# Patient Record
Sex: Female | Born: 1960 | Race: White | Hispanic: No | Marital: Married | Smoking: Never smoker
Health system: Southern US, Community
[De-identification: ages and names within clinical notes are randomized; demographics above are authoritative.]

## PROBLEM LIST (undated history)

## (undated) DIAGNOSIS — D689 Coagulation defect, unspecified: Secondary | ICD-10-CM

## (undated) DIAGNOSIS — I471 Supraventricular tachycardia, unspecified: Secondary | ICD-10-CM

## (undated) DIAGNOSIS — E039 Hypothyroidism, unspecified: Secondary | ICD-10-CM

## (undated) DIAGNOSIS — E785 Hyperlipidemia, unspecified: Secondary | ICD-10-CM

## (undated) DIAGNOSIS — D1803 Hemangioma of intra-abdominal structures: Secondary | ICD-10-CM

## (undated) DIAGNOSIS — E28319 Asymptomatic premature menopause: Secondary | ICD-10-CM

## (undated) DIAGNOSIS — M8580 Other specified disorders of bone density and structure, unspecified site: Secondary | ICD-10-CM

## (undated) DIAGNOSIS — M810 Age-related osteoporosis without current pathological fracture: Secondary | ICD-10-CM

## (undated) HISTORY — DX: Hyperlipidemia, unspecified: E78.5

## (undated) HISTORY — DX: Coagulation defect, unspecified: D68.9

## (undated) HISTORY — DX: Supraventricular tachycardia, unspecified: I47.10

## (undated) HISTORY — DX: Hypothyroidism, unspecified: E03.9

## (undated) HISTORY — DX: Age-related osteoporosis without current pathological fracture: M81.0

## (undated) HISTORY — DX: Other specified disorders of bone density and structure, unspecified site: M85.80

## (undated) HISTORY — PX: TONSILLECTOMY: SUR1361

## (undated) HISTORY — DX: Supraventricular tachycardia: I47.1

## (undated) HISTORY — PX: GALLBLADDER SURGERY: SHX652

## (undated) HISTORY — DX: Asymptomatic premature menopause: E28.319

## (undated) HISTORY — DX: Hemangioma of intra-abdominal structures: D18.03

---

## 1997-09-16 HISTORY — PX: ABDOMINAL HYSTERECTOMY: SHX81

## 2005-10-31 ENCOUNTER — Emergency Department: Payer: Self-pay | Admitting: Emergency Medicine

## 2005-12-19 ENCOUNTER — Ambulatory Visit: Payer: Self-pay | Admitting: Gastroenterology

## 2005-12-24 ENCOUNTER — Ambulatory Visit: Payer: Self-pay | Admitting: Gastroenterology

## 2007-03-12 ENCOUNTER — Other Ambulatory Visit: Payer: Self-pay

## 2007-03-12 ENCOUNTER — Emergency Department: Payer: Self-pay | Admitting: Unknown Physician Specialty

## 2007-03-12 ENCOUNTER — Encounter: Payer: Self-pay | Admitting: Internal Medicine

## 2009-01-26 ENCOUNTER — Ambulatory Visit: Payer: Self-pay | Admitting: Gastroenterology

## 2009-09-16 HISTORY — PX: CARDIAC ELECTROPHYSIOLOGY MAPPING AND ABLATION: SHX1292

## 2009-11-11 ENCOUNTER — Observation Stay: Payer: Self-pay | Admitting: Internal Medicine

## 2009-11-28 ENCOUNTER — Encounter: Payer: Self-pay | Admitting: Internal Medicine

## 2010-01-06 ENCOUNTER — Encounter: Payer: Self-pay | Admitting: Internal Medicine

## 2010-01-07 ENCOUNTER — Encounter: Payer: Self-pay | Admitting: Internal Medicine

## 2010-01-09 ENCOUNTER — Ambulatory Visit: Payer: Self-pay | Admitting: Internal Medicine

## 2010-01-09 DIAGNOSIS — R072 Precordial pain: Secondary | ICD-10-CM | POA: Insufficient documentation

## 2010-01-09 DIAGNOSIS — E785 Hyperlipidemia, unspecified: Secondary | ICD-10-CM | POA: Insufficient documentation

## 2010-01-09 DIAGNOSIS — E032 Hypothyroidism due to medicaments and other exogenous substances: Secondary | ICD-10-CM | POA: Insufficient documentation

## 2010-01-09 DIAGNOSIS — I471 Supraventricular tachycardia: Secondary | ICD-10-CM | POA: Insufficient documentation

## 2010-01-10 ENCOUNTER — Encounter: Payer: Self-pay | Admitting: Internal Medicine

## 2010-01-11 ENCOUNTER — Encounter: Payer: Self-pay | Admitting: Cardiovascular Disease

## 2010-01-16 ENCOUNTER — Telehealth (INDEPENDENT_AMBULATORY_CARE_PROVIDER_SITE_OTHER): Payer: Self-pay | Admitting: *Deleted

## 2010-01-17 ENCOUNTER — Encounter (HOSPITAL_COMMUNITY): Admission: RE | Admit: 2010-01-17 | Discharge: 2010-03-16 | Payer: Self-pay | Admitting: Internal Medicine

## 2010-01-17 ENCOUNTER — Ambulatory Visit: Payer: Self-pay

## 2010-01-17 ENCOUNTER — Ambulatory Visit: Payer: Self-pay | Admitting: Cardiovascular Disease

## 2010-01-19 ENCOUNTER — Telehealth: Payer: Self-pay | Admitting: Cardiovascular Disease

## 2010-01-24 ENCOUNTER — Telehealth: Payer: Self-pay | Admitting: Internal Medicine

## 2010-02-01 ENCOUNTER — Ambulatory Visit: Payer: Self-pay | Admitting: Internal Medicine

## 2010-02-05 ENCOUNTER — Telehealth: Payer: Self-pay | Admitting: Internal Medicine

## 2010-02-08 ENCOUNTER — Encounter: Payer: Self-pay | Admitting: Internal Medicine

## 2010-02-08 ENCOUNTER — Telehealth: Payer: Self-pay | Admitting: Internal Medicine

## 2010-02-21 ENCOUNTER — Telehealth: Payer: Self-pay | Admitting: Internal Medicine

## 2010-02-22 ENCOUNTER — Encounter: Payer: Self-pay | Admitting: Internal Medicine

## 2010-03-01 ENCOUNTER — Ambulatory Visit (HOSPITAL_COMMUNITY): Admission: RE | Admit: 2010-03-01 | Discharge: 2010-03-02 | Payer: Self-pay | Admitting: Internal Medicine

## 2010-03-01 ENCOUNTER — Ambulatory Visit: Payer: Self-pay | Admitting: Internal Medicine

## 2010-03-02 ENCOUNTER — Encounter: Payer: Self-pay | Admitting: Internal Medicine

## 2010-03-05 ENCOUNTER — Encounter: Payer: Self-pay | Admitting: Internal Medicine

## 2010-03-05 ENCOUNTER — Telehealth: Payer: Self-pay | Admitting: Internal Medicine

## 2010-04-02 ENCOUNTER — Ambulatory Visit: Payer: Self-pay | Admitting: Internal Medicine

## 2010-06-29 ENCOUNTER — Telehealth: Payer: Self-pay | Admitting: Internal Medicine

## 2010-07-12 ENCOUNTER — Telehealth: Payer: Self-pay | Admitting: Internal Medicine

## 2010-07-17 ENCOUNTER — Telehealth: Payer: Self-pay | Admitting: Internal Medicine

## 2010-08-08 ENCOUNTER — Encounter: Payer: Self-pay | Admitting: Internal Medicine

## 2010-08-08 ENCOUNTER — Telehealth: Payer: Self-pay | Admitting: Internal Medicine

## 2010-08-21 ENCOUNTER — Ambulatory Visit: Payer: Self-pay | Admitting: Internal Medicine

## 2010-08-22 ENCOUNTER — Ambulatory Visit: Payer: Self-pay | Admitting: Surgery

## 2010-08-27 ENCOUNTER — Ambulatory Visit: Payer: Self-pay | Admitting: Gastroenterology

## 2010-09-05 ENCOUNTER — Telehealth: Payer: Self-pay | Admitting: Internal Medicine

## 2010-09-06 ENCOUNTER — Ambulatory Visit: Payer: Self-pay | Admitting: Surgery

## 2010-09-13 ENCOUNTER — Telehealth: Payer: Self-pay | Admitting: Internal Medicine

## 2010-09-16 ENCOUNTER — Ambulatory Visit: Payer: Self-pay | Admitting: Surgery

## 2010-09-19 ENCOUNTER — Telehealth (INDEPENDENT_AMBULATORY_CARE_PROVIDER_SITE_OTHER): Payer: Self-pay | Admitting: *Deleted

## 2010-10-09 ENCOUNTER — Ambulatory Visit: Payer: Self-pay | Admitting: Oncology

## 2010-10-16 ENCOUNTER — Ambulatory Visit: Payer: Self-pay | Admitting: Gastroenterology

## 2010-10-16 NOTE — Assessment & Plan Note (Signed)
Summary: 11:00 APPT/AMD   Visit Type:  Follow-up Referring Provider:  BENSIMHON Primary Provider:  Dr. Terance Hart  CC:  Clearance for Bariatric Surgery.Marland Kitchen  History of Present Illness: Michelle Armstrong is seen for clearance for baraitric surgery. She is s/p followup for tachycardia for which he underwent EP testing and catheter ablation. 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 She has had no clinical recurrences.  The patient denies SOB, chest pain, edema or palpitations  She is trying to exercise, but has pain on her left heel  she underwent sleeptesting which was abnormal  Pre procedural eval included and echo by Dr Haskel Khan read as normal, and Myoview at [facility] which was normal        Current Medications (verified): 1)  Vitamin E 100 Unit/gm Crea (Vitamin E) .... Take 1 Tablet By Mouth Once Daily 2)  Calcium 1500 Mg Tabs (Calcium Carbonate) .Marland Kitchen.. 1 By Mouth Once Daily 3)  Aspirin 81 Mg Tbec (Aspirin) .... Take One Tablet By Mouth Daily 4)  L-Lysine 500 Mg Tabs (Lysine) .Marland Kitchen.. 1 By Mouth Once Daily 5)  Vitamin D3 1000 Unit Tabs (Cholecalciferol) .... One Tablet Once Daily  Allergies (verified): 1)  ! * Antibiotics 2)  ! * Latex 3)  ! * Metoprolol  Past History:  Past Medical History: Last updated: 01/09/2010 SVT   --echo 2008 EF 55% normal Obesity Hyperlipidemia Low vitamin d Possible h/o hypothyroidis,  Past Surgical History: Last updated: 01/09/2010 hysterectomy  Family History: Last updated: 01/09/2010 Mother: alive at 17 Father: alive at 18 Negative FH of Diabetes, Hypertension, or Coronary Artery Disease  Social History: Last updated: 01/09/2010 Married. 1 son 24 Tobacco Use - No.  Alcohol Use - no Drug Use - no  Risk Factors: Smoking Status: never (01/09/2010)  Vital Signs:  Patient profile:   50 year old female Height:      67 inches Weight:      230 pounds BMI:     36.15 Pulse rate:   92 / minute BP sitting:   120 / 76  (left arm) Cuff size:    large  Vitals Entered By: Bishop Dublin, CMA (August 21, 2010 11:10 AM)  Physical Exam  General:  The patient was alert and oriented in no acute distress. HEENT Normal.  Neck veins were flat, carotids were brisk.  Lungs were clear.  Heart sounds were regular without murmurs or gallops.  Abdomen was soft with active bowel sounds. There is no clubbing cyanosis or edema. Skin Warm and dry    EKG  Procedure date:  08/21/2010  Findings:      sinus@ 92 .18/.08/.36 o/w normal  Impression & Recommendations:  Problem # 1:  PREOPERATIVE EXAMINATION (ICD-V72.84) she should be at acceptable for surgery  Problem # 2:  SVT/ PSVT/ PAT (ICD-427.0) no recurrent svt The following medications were removed from the medication list:    Nitrostat 0.4 Mg Subl (Nitroglycerin) .Marland Kitchen... Take 1 tablet by mouth every 5 minutes as needed for chest pain Her updated medication list for this problem includes:    Aspirin 81 Mg Tbec (Aspirin) .Marland Kitchen... Take one tablet by mouth daily  Problem # 3:  OBESITY (ICD-278.00) we spent an addtional 12 minutes discussing exercese strategies which she might tolerate better

## 2010-10-16 NOTE — Assessment & Plan Note (Signed)
Summary: EPH/AMD   Visit Type:  EPH Referring Provider:  BENSIMHON Primary Provider:  Dr. Terance Hart  CC:  chest soreness....no other complaints today.  History of Present Illness: Michelle Armstrong's is seen in followup for tachycardia for which he underwent EP testing and catheter ablation. 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 She has had no clinical recurrences.  She continues to have atypical chest pains which antedate the procedure. Myoview scanning prior to the procedure had been normal.    Current Medications (verified): 1)  Vitamin E 100 Unit/gm Crea (Vitamin E) .... Take 1 Tablet By Mouth Once Daily 2)  Nitrostat 0.4 Mg Subl (Nitroglycerin) .... Take 1 Tablet By Mouth Every 5 Minutes As Needed For Chest Pain 3)  Calcium 1500 Mg Tabs (Calcium Carbonate) .Marland Kitchen.. 1 By Mouth Once Daily 4)  Aspirin 81 Mg Tbec (Aspirin) .... Take One Tablet By Mouth Daily 5)  L-Lysine 500 Mg Tabs (Lysine) .Marland Kitchen.. 1 By Mouth Once Daily  Allergies: 1)  ! * Antibiotics 2)  ! * Latex 3)  ! * Metoprolol  Past History:  Past Medical History: Last updated: 01/09/2010 SVT   --echo 2008 EF 55% normal Obesity Hyperlipidemia Low vitamin d Possible h/o hypothyroidis,  Past Surgical History: Last updated: 01/09/2010 hysterectomy  Vital Signs:  Patient profile:   50 year old female Height:      67 inches Pulse rate:   76 / minute Pulse rhythm:   regular BP sitting:   117 / 78  (left arm) Cuff size:   large  Vitals Entered By: Danielle Rankin, CMA (April 02, 2010 4:30 PM)  Physical Exam  General:  The patient was alert and oriented in no acute distress. HEENT Normal.  Neck veins were flat, carotids were brisk.  Lungs were clear.  Heart sounds were regular without murmurs or gallops.  Abdomen was soft with active bowel sounds. There is no clubbing cyanosis or edema. Skin Warm and dry    EKG  Procedure date:  04/02/2010  Findings:      sinus rhythm at 76 Intervals 0.20/0.09/0.41 Axis is  57 Otherwise normal  Impression & Recommendations:  Problem # 1:  CHEST PAIN-PRECORDIAL (ICD-786.51) She continues to have atypical chest pains. These are not likely cardiac. No further workup is indicated following her negative Myoview.  we will plan to his helper establish with a primary care physician. I mentioned Dr. Darrick Huntsman  to her despite her reluctance to establish a primary care physician she was agreeable to following through with that. We have called [facility] to establish a referral  Her updated medication list for this problem includes:    Nitrostat 0.4 Mg Subl (Nitroglycerin) .Marland Kitchen... Take 1 tablet by mouth every 5 minutes as needed for chest pain    Aspirin 81 Mg Tbec (Aspirin) .Marland Kitchen... Take one tablet by mouth daily  Problem # 2:  SVT/ PSVT/ PAT (ICD-427.0)  status post catheter ablation for AV node reentry. No clinical recurrences.

## 2010-10-16 NOTE — Letter (Signed)
Summary: [facility]   Imported By: Roderic Ovens 03/23/2010 15:28:41  _____________________________________________________________________  External Attachment:    Type:   Image     Comment:   External Document

## 2010-10-16 NOTE — Progress Notes (Signed)
Summary: RELEASE  RELEASE   Imported By: Frazier Butt Chriscoe 01/11/2010 12:06:22  _____________________________________________________________________  External Attachment:    Type:   Image     Comment:   External Document

## 2010-10-16 NOTE — Letter (Signed)
Summary: [facility]   Imported By: Harlon Flor 01/10/2010 11:10:39  _____________________________________________________________________  External Attachment:    Type:   Image     Comment:   External Document

## 2010-10-16 NOTE — Miscellaneous (Signed)
Summary: Orders Update  Clinical Lists Changes  Orders: Added new Test order of T-CBC No Diff ([id number]) - Signed Added new Test order of T-Basic Metabolic Panel [phone]) - Signed Added new Test order of T-PTT ([id number]) - Signed Added new Test order of T-Protime, Auto ([id number]) - Signed

## 2010-10-16 NOTE — Letter (Signed)
Summary: ELectrophysiology/Ablation Procedure Instructions  Architectural technologist at [facility] [address]   [place], [place] [address]   Phone: [phone]  Fax: [phone]     Electrophysiology/Ablation Procedure Instructions    You are scheduled for a(n) SVT ablation on 03/01/10 at 7:30am with Dr. Graciela Husbands.  1.  Please come to the Short Stay Center at [facility] at 5:30am on the day of your procedure.  2.  Come prepared to stay overnight.   Please bring your insurance cards and a list of your medications.  3.  Have labwork done 7 days prior to procedure on  02/22/10 and results faxed to [facility] at [phone].   4.  Do not have anything to eat or drink after midnight the night before your procedure.   * Occasionally, EP studies and ablations can become lengthy.  Please make your family aware of this before your procedure starts.  Average time ranges from 2-8 hours for EP studies/ablations.  Your physician will locate your family after the procedure with the results.  * If you have any questions after you get home, please call the office at [phone].

## 2010-10-16 NOTE — Progress Notes (Signed)
Summary: Clearance  ---- Converted from flag ---- ---- 07/17/2010 10:45 AM, Nathen May, MD, [facility] wrote: she is s/p abaltion;  she should feel to treat as she would like steve klein  ---- 07/12/2010 2:55 PM, Benedict Needy, RN wrote: Dr. Darrick Huntsman would like to start pt on Phenteramine for weight loss but would like permisson. Please advise. ------------------------------

## 2010-10-16 NOTE — Progress Notes (Signed)
Summary: RESULTS  Phone Note Call from Patient Call back at Home Phone [phone]   Caller: SELF Call For: [facility] Summary of Call: PT WOULD LIKE STRESS TEST RESULTS FROM THIS PAST [facility] Initial call taken by: Harlon Flor,  Jan 19, 2010 10:08 AM  Follow-up for Phone Call        Called spoke with pt, advised stress test results normal per Dr Fabio Bering interpretation. Follow-up by: [name] RN,  Jan 19, 2010 12:38 PM

## 2010-10-16 NOTE — Progress Notes (Signed)
Summary: ABLATION  Phone Note Call from Patient Call back at Home Phone [phone]   Caller: SELF Call For: Rosealee Recinos Summary of Call: WOULD LIKE TO KNOW HOW FAR ALONG ERIKA IS IN SCHEDULING HER ABLATION-WOULD LIKE TO KNOW IF SHE CAN HAVE IT SCHEDULED ON JUNE 1 Initial call taken by: Harlon Flor,  Feb 05, 2010 8:46 AM  Follow-up for Phone Call        WOULD LIKE TO MENTION THAT IF THERE IS AN OPENING FOR THE ABLATION THIS WEEK THAT SHE WOULD LIKE TO TAKE IT Follow-up by: Harlon Flor,  Feb 05, 2010 8:54 AM  Additional Follow-up for Phone Call Additional follow up Details #1::        Attempted to call back pt.  LMOM TCB. [name] RN  Feb 05, 2010 11:48 AM   Scheduled ablation for soonest appt for Dr Graciela Husbands on 03/01/10.  Called spoke with pt, aware of appt date and time.  Additional Follow-up by: [name] RN,  Feb 08, 2010 2:57 PM

## 2010-10-16 NOTE — Progress Notes (Signed)
Summary: IV SITE  Phone Note Call from Patient Call back at Home Phone [phone]   Caller: SELF Call For: Graciela Husbands Summary of Call: PT WAS TOLD TO CALL THE OFFICE AND INFORM us THAT SHE HAD AN ALLERGIC REACTION TO KEFLEX-HAD A RASH AND ITCHING-PT WOULD LIKE A CALL FROM THE NURSE ABOUT THE IV SITE AND GETTING AN ANTIBIOTIC Initial call taken by: Harlon Flor,  March 05, 2010 8:17 AM  Follow-up for Phone Call        Spoke to pt she is having no redness, no swelling.  The area is a small knot under her skin. After speaking with Dr. Mariah Milling instructed the pt to put warm compresses on the area if any redness, more swelling or warmth to see pcp.  Follow-up by: Benedict Needy, RN,  March 05, 2010 9:56 AM

## 2010-10-16 NOTE — Progress Notes (Signed)
Summary: DR Darrick Huntsman  Phone Note Call from Patient Call back at Home Phone [phone]   Caller: SELF Call For: Michelle Armstrong Summary of Call: DR Darrick Huntsman NEEDS PERMISSION TO START PT ON A WEIGHT LOSS PILL  Initial call taken by: Harlon Flor,  July 12, 2010 9:43 AM  Follow-up for Phone Call        [facility] TCB Benedict Needy, RN  July 12, 2010 2:34 PM   Spoke with pt  she said the name of medication is Phenteramine. Will send Dr. Graciela Armstrong flag for permission. Benedict Needy, RN  July 12, 2010 2:53 PM   [facility] that weight loss medication ok'd by Dr. Graciela Armstrong. Note faxed to Dr. Melina Schools office.  Follow-up by: Benedict Needy, RN,  July 17, 2010 10:54 AM

## 2010-10-16 NOTE — Assessment & Plan Note (Signed)
Summary: NP6/AMD   Visit Type:  Initial Consult Primary Provider:  Dr. Terance Hart  CC:  Patient has been feeling horrible and having nausea while she hs been taking Metoprolol. Patient having discomfort in her chest. Patient edema in both feet and ankles and having slight edema in hands. Patient have SOB when walking up hills and inclines..  History of Present Illness: 50 y/o woman with h/o obesity, hyperlipidemia, chest pain and SVT. Previously followed by Dr. Juliann Pares for chest and SVT. Present here to establish care after recent episode of recurrent SVT.  Had her first epiosde of SVT in 2003 after drinking large cup of tea. Converted with adenosine. Saw Dr. Gwen Pounds. Echo at that time (7/08) with EF 55% no valvular abnormalities.   Had another episode of mild palpitations in December which resolved spontaneously.  In February of this year had another episode of palpitations accompanied by CP and palpitations. Seen in ER and discharged. Had echo last month which she doesn't know the results of. Was supposed to have stress test but she didn't do it.  This past weekend was in [place] going to [place] and developed severe CP, dizziness and palpitations. ECG showed SVT at 197 beats per minute. Broke with adenosine at whcih point dizziness and palpitations resolved but CP persisted. Troponin minimally elevated at 0.120. TSH nromal. Kept overnight for observation. Started on lopressor which has made her feel queasy.  Has very stressful job at [employer]. At baseline very functional but has been scared to exercise since episode in February. Notes when she walks to car she gets SOB. No orthopnea or PND. Occasional edema. No CP with exertion but has mild dull ache since SVT.   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 Take 1 Tablet By Mouth Once Daily 2)  Vitamin E 100 Unit/gm Crea (Vitamin E) .... Take 1 Tablet By Mouth Once Daily 3)  Nitrostat 0.4 Mg Subl (Nitroglycerin) .... Take 1 Tablet By Mouth Every 5 Minutes As Needed For Chest Pain  Allergies (verified): 1)  ! * Antibiotics 2)  ! * Latex  Past History:  Past Surgical History: Last updated: 01/09/2010 hysterectomy  Family History: Last updated: 01/09/2010 Mother: alive at 4 Father: alive at 26 Negative FH of Diabetes, Hypertension, or Coronary Artery Disease  Social History: Last updated: 01/09/2010 Married. 1 son 55 Tobacco Use - No.  Alcohol Use - no Drug Use - no  Past Medical History: SVT   --echo 2008 EF 55% normal Obesity Hyperlipidemia Low vitamin d Possible h/o hypothyroidis,  Family History: Reviewed history from 01/09/2010 and no changes required. Mother: alive at 23 Father: alive at 47 Negative FH of Diabetes, Hypertension, or Coronary Artery Disease  Social History: Reviewed history from 01/09/2010 and no changes required. Married. 1 son 41 Tobacco Use - No.  Alcohol Use - no Drug Use - no  Review of Systems       As per HPI and past medical history; otherwise all systems negative.   Vital Signs:  Patient profile:   50 year old female Height:      67 inches  Pulse rate:   79 / minute BP sitting:   120 / 72  (left arm) Cuff size:   large  Physical Exam  General:  Gen: well appearing. no resp difficulty HEENT: normal Neck: supple. no JVD. Carotids 2+ bilat; no bruits. No lymphadenopathy or thryomegaly appreciated. Cor: PMI nondisplaced. Regular rate & rhythm. No rubs, gallops, murmur. Lungs: clear Abdomen:  obese. soft, nontender, nondistended.  Good bowel sounds. Extremities: no cyanosis, clubbing, rash, edema Neuro: alert & orientedx3, cranial nerves grossly intact. moves all 4 extremities w/o difficulty. affect pleasant    Impression & Recommendations:  Problem # 1:  SVT/ PSVT/ PAT (ICD-427.0) Had long talk with her and her husband about the mechanisms of SVT and the possible therapies. Given increasing severity and frequency of the episodes I have strongly recommended SVT ablation with Dr. Graciela Husbands. We discussed what an EP study is as well as the risks. They are eager to proceed. We will arrange an appt. Given CP we will order Lexiscan Myoview to evaluate. She prefers not to walk on treadmill due to concern over triggering recurrent SVT. have instructed her to use lopressor only as needed for palpitations. Time spent 1 hour.  Problem # 2:  CHEST PAIN-PRECORDIAL (ICD-786.51) As above will order stress test.   Other Orders: Nuclear Stress Test (Nuc Stress Test)  Patient Instructions: 1)  Your physician recommends that you schedule a follow-up appointment with Dr. Graciela Husbands as soon as possible.  Follow up with Dr. Gala Romney as needed. 2)  Your physician has requested that you have an lexiscan myoview.  For further information please visit [email or web address]  Please follow instruction sheet, as given.

## 2010-10-16 NOTE — Progress Notes (Signed)
Summary: ABLATION  Phone Note Call from Patient Call back at Home Phone [phone]   Caller: SELF Call For: Sylis Ketchum Summary of Call: PT IS UNDER THE IMPRESSION THAT SHE IS DEFINATELY GETTING AN ABLATION AND WANTS IT SCHEDULED FOR 5/26-I TOLD HER THAT I THOUGHT SHE WAS SCHEDULED TO SEE DR Graciela Husbands TO DISCUSS THE POSSIBILITY OF AN ABLATION, BUT THAT I WAS NOT SURE AND WOULD LET THE NURSE CALL HER BACK Initial call taken by: Harlon Flor,  Jan 24, 2010 11:19 AM  Follow-up for Phone Call        Called spoke with pt, advised she must first see Dr Graciela Husbands before the ablation can be scheduled.  Also made pt aware Dr Graciela Husbands is not in the hospital working that day, so that day would not be an option to schedule. Follow-up by: [name] RN,  Jan 24, 2010 12:12 PM

## 2010-10-16 NOTE — Progress Notes (Signed)
Summary: Nuclear Pre-Procedure  Phone Note Outgoing Call   Call placed by: Milana Na, EMT-P,  Jan 16, 2010 2:33 PM Summary of Call: Left message with information on Myoview Information Sheet (see scanned document for details).      Nuclear Med Background Indications for Stress Test: Evaluation for Ischemia  Indications Comments: [facility] ([place] for SVT converted with Adensosine  History: Echo  History Comments: 07/08 ECHO EF 55%  Symptoms: Chest Pain, Dizziness, DOE, Nausea, Palpitations    Nuclear Pre-Procedure Cardiac Risk Factors: Lipids Height (in): 67  Nuclear Med Study Referring MD:  D.Bensimhon

## 2010-10-16 NOTE — Letter (Signed)
Summary: Medical Record Release  Medical Record Release   Imported By: Harlon Flor 08/08/2010 14:27:29  _____________________________________________________________________  External Attachment:    Type:   Image     Comment:   External Document

## 2010-10-16 NOTE — Assessment & Plan Note (Signed)
Summary: nep/svt/   Visit Type:  NEP Referring Provider:  BENSIMHON Primary Provider:  Dr. Terance Hart  CC:  LITTLE SORENESS IN CHEST, NO SHORTNESS OF BREATH, and NO EDEMA IN ANKLES AND FEET.Marland Kitchen  History of Present Illness: 50 y/o is seen atheh request of dr DB for  SVT.   Had her first epiosde of SVT in 2003 after drinking large cup of tea. Converted with adenosine. Saw Dr. Gwen Pounds. Echo at that time (7/08) with EF 55% no valvular abnormalities.   Had another episode of mild palpitations in December which resolved spontaneously.  In February of this year had another episode of palpitations accompanied by CP and palpitations. Seen in ER and discharged.    This past weekend was in [place] going to [place] and developed severe CP, dizziness and palpitations. ECG showed SVT at 197 beats per minute. Broke with adenosine at whcih point dizziness and palpitations resolved but CP persisted. Troponin minimally elevated at 0.120. TSH nromal. Kept overnight for observation. Started on lopressor which has made her feel queasy.  Has very stressful job at [employer]. At baseline very functional but has been scared to exercise since episode in February. Notes when she walks to car she gets SOB. No orthopnea or PND. Occasional edema. No CP with exertion but has mild dull ache since SVT.   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 Take 1 Tablet By Mouth Once Daily 2)  Nitrostat 0.4 Mg Subl (Nitroglycerin) .... Take 1 Tablet By Mouth Every 5 Minutes As Needed For Chest Pain 3)  Calcium 1500 Mg Tabs (Calcium Carbonate) .Marland Kitchen.. 1 By Mouth Once Daily 4)  Aspirin 81 Mg Tbec (Aspirin) .... Take One Tablet By Mouth Daily 5)  L-Lysine 500  Mg Tabs (Lysine) .Marland Kitchen.. 1 By Mouth Once Daily  Allergies (verified): 1)  ! * Antibiotics 2)  ! * Latex 3)  ! * Metoprolol  Past History:  Past Medical History: Last updated: 01/09/2010 SVT   --echo 2008 EF 55% normal Obesity Hyperlipidemia Low vitamin d Possible h/o hypothyroidis,  Past Surgical History: Last updated: 01/09/2010 hysterectomy  Family History: Last updated: 01/09/2010 Mother: alive at 8 Father: alive at 33 Negative FH of Diabetes, Hypertension, or Coronary Artery Disease  Social History: Last updated: 01/09/2010 Married. 1 son 68 Tobacco Use - No.  Alcohol Use - no Drug Use - no  Risk Factors: Smoking Status: never (01/09/2010)  Vital Signs:  Patient profile:   50 year old female Height:      67 inches Pulse rate:   82 / minute Pulse rhythm:   regular BP sitting:   118 / 82  (left arm) Cuff size:   large  Vitals Entered By: Mercer Pod (Feb 01, 2010 8:39 AM)  Physical Exam  General:  Gen: well appearing. no resp difficulty HEENT: normal Neck: supple. no JVD. Carotids 2+ bilat; no bruits. No lymphadenopathy or thryomegaly appreciated. Cor: PMI nondisplaced. Regular rate & rhythm. No rubs, gallops, murmur. Lungs: clear Abdomen: obese. soft, nontender, nondistended.  Good bowel sounds. Extremities: no cyanosis, clubbing, rash, edema Neuro: alert & orientedx3, cranial nerves grossly intact. moves all 4 extremities w/o difficulty. affect pleasant    Impression & Recommendations:  Problem # 1:  SVT/ PSVT/ PAT (ICD-427.0) Recurrent SVT;  mechanism not clear form the ECG without evidence of R' in lead V1  Discusseed therpapuetic options including catheter ablation withits attendant risks and benefits. these include but are not limited to death perofration and heart block requiring pacer She would like to proceed The following medications were removed from the medication list:    Metoprolol Succinate 25 Mg Xr24h-tab (Metoprolol succinate)  .Marland Kitchen... Take 1 tablet by mouth once daily Her updated medication list for this problem includes:    Nitrostat 0.4 Mg Subl (Nitroglycerin) .Marland Kitchen... Take 1 tablet by mouth every 5 minutes as needed for chest pain    Aspirin 81 Mg Tbec (Aspirin) .Marland Kitchen... Take one tablet by mouth daily  Problem # 2:  CHEST PAIN-PRECORDIAL (ICD-786.51) negative myoview normal lv function The following medications were removed from the medication list:    Metoprolol Succinate 25 Mg Xr24h-tab (Metoprolol succinate) .Marland Kitchen... Take 1 tablet by mouth once daily Her updated medication list for this problem includes:    Nitrostat 0.4 Mg Subl (Nitroglycerin) .Marland Kitchen... Take 1 tablet by mouth every 5 minutes as needed for chest pain    Aspirin 81 Mg Tbec (Aspirin) .Marland Kitchen... Take one tablet by mouth daily

## 2010-10-16 NOTE — Progress Notes (Signed)
Summary: LAB WORK  Phone Note Call from Patient Call back at Home Phone [phone]   Caller: self Call For: KLEIN Summary of Call: PT WOULD LIKE TO KNOW IF SHE CAN GET HER BLOOD TYPE ADDED TO THE BLOODWORK THAT SHE IS RECEIVING TOMORROW AT [facility] Initial call taken by: Harlon Flor,  February 21, 2010 2:37 PM  Follow-up for Phone Call        [facility] TCB Benedict Needy, RN  February 21, 2010 2:45 PM   Pt works at [employer].  Has pending labwork scheduled for tomorrow pre-ablation bloodwork. Pt wants to know if she can be tested for her blood type with her other bloodwork, it's free and she doesn't know her blood type.  Please advise.   Follow-up by: [name] RN,  February 21, 2010 3:28 PM  Additional Follow-up for Phone Call Additional follow up Details #1::        Not addressed by MD before blood drawn. Gypsy Balsam RN BSN  February 26, 2010 9:37 AM

## 2010-10-16 NOTE — Progress Notes (Signed)
Summary: History and Physical  History and Physical   Imported By: Harlon Flor 01/10/2010 11:12:58  _____________________________________________________________________  External Attachment:    Type:   Image     Comment:   External Document

## 2010-10-16 NOTE — Progress Notes (Signed)
Summary: QUESTIONS  Phone Note Call from Patient Call back at Home Phone [phone]   Caller: Patient Call For: BENSIMHON Summary of Call: PATIENT CALLED AND WOULD LIKE FOR DR. Gala Romney TO CALL HER TO TALK ABOUT POSSIBLY HAVING THE GASTRIC BYPASS SURGERY DONE THIS YEAR.  SHE HAS SOME QUESTIONS FOR HIM. [phone] Initial call taken by: [place],  June 29, 2010 2:01 PM  Follow-up for Phone Call        Called and spoke to pt she is wanting to have gastric bypass.  Her insurance requires that she is counseled on diet,  excercise and weights being monitor for 6 months.  Pt says that Dr. Gala Romney counseled her on her diet and that they also talked about excercise plan. Please advise if this is something we can do for her. Benedict Needy, RN  July 04, 2010 8:22 AM   Additional Follow-up for Phone Call Additional follow up Details #1::        there is formal counseling program that she has to go through. Meredith Staggers can give you packet of info and referral to Dr. Wenda Low whom we use in [place]. Dolores Patty, MD, [facility]  July 04, 2010 1:14 PM     Additional Follow-up for Phone Call Additional follow up Details #2::    Pt is going through her PCP will not require packet.  Follow-up by: Benedict Needy, RN,  July 09, 2010 2:04 PM

## 2010-10-16 NOTE — Progress Notes (Signed)
Summary: ABLATION  Phone Note Call from Patient Call back at Home Phone [phone]   Caller: SELF Call For: KLEIN Summary of Call: PT WOULD LIKE TO KNOW IF SHE CAN HAVE HER ABLATION SCHEDULED FOR NEXT WEEK #[phone] PT NEED LABWORK DONE BEFOREHAND Initial call taken by: Harlon Flor,  Feb 08, 2010 12:10 PM  Follow-up for Phone Call        PT CALLED BACK STATING WOULD BE AT [facility]UNCH UNTIL 2:20 CALL HER AT [phone] IF DURING THIS TIME Follow-up by: Park Breed,  Feb 08, 2010 1:54 PM  Additional Follow-up for Phone Call Additional follow up Details #1::        Called spoke with pt advised ablation scheduled for 03/01/10 at 7:30am.  Pt is employed by [employer] and needs orders to have labs drawn there.  Will print instruction sheet and lab orders and leave at the front desk for pt to pick-up.   Additional Follow-up by: [name] RN,  Feb 08, 2010 2:52 PM

## 2010-10-16 NOTE — Progress Notes (Signed)
Summary: Problems  Phone Note Call from Patient Call back at Home Phone [phone]   Caller: Self Call For: Michelle Armstrong Summary of Call: Pt had a CPAP done at [facility]-states that she was anxious during this procedure and that she felt a tight soreness in her chest that lasted for several days-States that the EKG that they performed while she was there did not show anything-Pt would like a call back regarding this and would like to be seen for it-Pt would also like to have a Carotid ultrasound performed b/c her grandmother had blockage in her carotids-Would like for our office to call her insurance to see if they will cover this. Initial call taken by: Harlon Flor,  August 08, 2010 2:03 PM  Follow-up for Phone Call        Attempted TCB pt.  LMOM TCB. [name] RN  August 08, 2010 4:43 PM   Additional Follow-up for Phone Call Additional follow up Details #1::        Patient left voicemail over Thanksgiving returning [place] call. Her number is [phone]  Attempted to call pt back.  LMOM TCB. [name] RN  August 14, 2010 3:52 PM   Pt never called back.  Pt has appt today in office with Dr Michelle Armstrong. Additional Follow-up by: [name] RN,  August 21, 2010 9:05 AM

## 2010-10-16 NOTE — Progress Notes (Signed)
Summary: PHI  PHI   Imported By: Harlon Flor 01/10/2010 11:14:45  _____________________________________________________________________  External Attachment:    Type:   Image     Comment:   External Document

## 2010-10-16 NOTE — Assessment & Plan Note (Signed)
Summary: Cardiology Nuclear Study  Nuclear Med Background Indications for Stress Test: Evaluation for Ischemia  Indications Comments: [facility] for SVT converted with Adensosine  History: Echo  History Comments: 07/08 ECHO EF 55%  Symptoms: Chest Pain, Dizziness, DOE, Nausea, Palpitations    Nuclear Pre-Procedure Cardiac Risk Factors: Lipids Caffeine/Decaff Intake: None NPO After: 6:30 PM Lungs: clear IV 0.9% NS with Angio Cath: 22g     IV Site: (L) AC IV Started by: Irean Hong RN Chest Size (in) 40     Cup Size D     Height (in): 67 Weight (lb): 229 BMI: 36.00 Tech Comments: Last metoprolol one week ago.   Nuclear Med Study 1 or 2 day study:  1 day     Stress Test Type:  Eugenie Birks Reading MD:  Charlton Haws, MD     Referring MD:  D.Bensimhon Resting Radionuclide:  Technetium 61m Tetrofosmin     Resting Radionuclide Dose:  11 mCi  Stress Radionuclide:  Technetium 71m Tetrofosmin     Stress Radionuclide Dose:  33 mCi   Stress Protocol   Lexiscan: 0.4 mg   Stress Test Technologist:  Milana Na EMT-P     Nuclear Technologist:  Domenic Polite CNMT  Rest Procedure  Myocardial perfusion imaging was performed at rest 45 minutes following the intravenous administration of Myoview Technetium 39m Tetrofosmin.  Stress Procedure  The patient received IV Lexiscan 0.4 mg over 15-seconds.  Myoview injected at 30-seconds.  There were no significant changes, + Lt. Headed, and sob with infusion.  Quantitative spect images were obtained after a 45 minute delay.  QPS Raw Data Images:  Normal; no motion artifact; normal heart/lung ratio. Stress Images:  NI: Uniform and normal uptake of tracer in all myocardial segments. Rest Images:  Normal homogeneous uptake in all areas of the myocardium. Subtraction (SDS):  Normal Transient Ischemic Dilatation:  .80  (Normal <1.22)  Lung/Heart Ratio:  .36  (Normal <0.45)  Quantitative Gated Spect Images QGS EDV:  60  ml QGS ESV:  9 ml QGS EF:  86 % QGS cine images:  normal  Findings Normal nuclear study      Overall Impression  Exercise Capacity: Lexiscan BP Response: Normal blood pressure response. Clinical Symptoms: Lightheaded ECG Impression: No significant ST segment change suggestive of ischemia. Overall Impression: Normal stress nuclear study.

## 2010-10-17 ENCOUNTER — Ambulatory Visit: Payer: Self-pay | Admitting: Oncology

## 2010-10-18 NOTE — Progress Notes (Signed)
  12 lead faxed to [facility] @ [phone] [facility]  September 19, 2010 9:06 AM

## 2010-10-18 NOTE — Progress Notes (Signed)
Summary: Weight History  Phone Note Call from Patient Call back at Home Phone [phone]   Caller: Self Call For: Bensimhon Summary of Call: Pt needs a weight history on letterhead sent to Dr. Arlyce Dice.  Fax #[phone]. Phone #[phone].  Case manager is Tyson Alias.  There is a weight on stress test and last OV. Initial call taken by: Harlon Flor,  September 13, 2010 11:30 AM  Follow-up for Phone Call        Last ov note and stress test with pt weight faxed to Dr. Gigi Gin office 09/13/10. Follow-up by: Lanny Hurst RN,  September 13, 2010 12:39 PM

## 2010-10-18 NOTE — Progress Notes (Signed)
Summary: Surgical Clearance  Phone Note Call from Patient Call back at Home Phone [phone]   Caller: Self Call For: Michelle Armstrong Summary of Call: Pt is calling about the status of her surgical clearance paperwork for Gastric Bypass surgery.  She states that Dr. Graciela Armstrong told her at her last appt that he had received the paperwork. Initial call taken by: Harlon Flor,  September 05, 2010 3:25 PM  Follow-up for Phone Call        Office note for surgical clearance from Dr. Graciela Armstrong faxed to Dr. Anselm Lis [phone] Follow-up by: Lanny Hurst RN,  September 06, 2010 9:45 AM

## 2010-10-19 LAB — PATHOLOGY REPORT

## 2010-11-27 ENCOUNTER — Ambulatory Visit: Payer: Self-pay | Admitting: Gastroenterology

## 2010-12-04 ENCOUNTER — Telehealth: Payer: Self-pay | Admitting: Internal Medicine

## 2010-12-14 ENCOUNTER — Telehealth: Payer: Self-pay | Admitting: Internal Medicine

## 2010-12-14 NOTE — Telephone Encounter (Signed)
Faxed Stress & Echo to Danielle at [facility] ([phone]).

## 2010-12-14 NOTE — Telephone Encounter (Signed)
Faxed OV & EKG to Danielle at [facility] ([phone]).

## 2010-12-14 NOTE — Telephone Encounter (Signed)
Per angelica received clearance paperwork  frm office pt okay to proceed as planned./cy

## 2010-12-14 NOTE — Telephone Encounter (Signed)
LMTCB ./CY 

## 2010-12-16 HISTORY — PX: HERNIA REPAIR: SHX51

## 2011-01-10 ENCOUNTER — Emergency Department: Payer: Self-pay | Admitting: Emergency Medicine

## 2011-04-03 ENCOUNTER — Encounter: Payer: Self-pay | Admitting: Cardiovascular Disease

## 2011-04-30 ENCOUNTER — Encounter: Payer: Self-pay | Admitting: Internal Medicine

## 2011-07-19 ENCOUNTER — Encounter: Payer: Self-pay | Admitting: Internal Medicine

## 2011-07-24 ENCOUNTER — Ambulatory Visit (INDEPENDENT_AMBULATORY_CARE_PROVIDER_SITE_OTHER): Payer: 59 | Admitting: Internal Medicine

## 2011-07-24 ENCOUNTER — Encounter: Payer: Self-pay | Admitting: Internal Medicine

## 2011-07-24 VITALS — BP 128/74 | HR 77 | Ht 68.0 in | Wt 173.0 lb

## 2011-07-24 DIAGNOSIS — I471 Supraventricular tachycardia: Secondary | ICD-10-CM

## 2011-07-24 NOTE — Progress Notes (Signed)
  HPI  Michelle Armstrong is a 50 y.o. female Seen in followup for SVT status post catheter ablation. She also had history of morbid obesity status post bariatric surgery 6 months ago.  She has lost 60lbs  And has had no recurrent tachypalps  Past Medical History  Diagnosis Date  . SVT (supraventricular tachycardia)     s/p RFCA  AVNRT  . Obesity   . HLD (hyperlipidemia)   . Vitamin D deficiency     low  . Hypothyroidism     possible h/o    Past Surgical History  Procedure Date  . Abdominal hysterectomy 1999    endometriosis  . Gallbladder surgery   . Hernia repair 12/2010  . Bariatric surgery 12/2010    No current outpatient prescriptions on file.    Allergies  Allergen Reactions  . Keflex     Rash & dizziness  . Latex     rash  . Metoprolol     REACTION: dizziness, slurred speech    Review of Systems negative except from HPI and PMH  Physical Exam Well developed and well nourished in no acute distress HENT normal E scleral and icterus clear Neck Supple Clear to ausculation Regular rate and rhythm, no murmurs gallops or rub Soft with active bowel sounds No clubbing cyanosis and edema Alert and oriented, grossly normal motor and sensory function Skin Warm and Dry  ECG sinus rhythm at 73intervals .21/.08/.41 O/w nrml   Assessment and  Plan

## 2011-07-24 NOTE — Assessment & Plan Note (Signed)
No recurrence  F/u prn

## 2011-07-24 NOTE — Patient Instructions (Signed)
Your physician recommends that you follow up As Needed.

## 2011-07-29 ENCOUNTER — Encounter: Payer: Self-pay | Admitting: Internal Medicine

## 2011-07-29 ENCOUNTER — Ambulatory Visit (INDEPENDENT_AMBULATORY_CARE_PROVIDER_SITE_OTHER): Payer: 59 | Admitting: Internal Medicine

## 2011-07-29 ENCOUNTER — Telehealth: Payer: Self-pay | Admitting: Internal Medicine

## 2011-07-29 VITALS — BP 100/64 | HR 95 | Temp 99.0°F | Wt 170.0 lb

## 2011-07-29 DIAGNOSIS — R918 Other nonspecific abnormal finding of lung field: Secondary | ICD-10-CM

## 2011-07-29 DIAGNOSIS — J069 Acute upper respiratory infection, unspecified: Secondary | ICD-10-CM

## 2011-07-29 DIAGNOSIS — R9389 Abnormal findings on diagnostic imaging of other specified body structures: Secondary | ICD-10-CM

## 2011-07-29 DIAGNOSIS — J40 Bronchitis, not specified as acute or chronic: Secondary | ICD-10-CM

## 2011-07-29 MED ORDER — AZITHROMYCIN 200 MG/5ML PO SUSR: 250.0000 mg | Freq: Every day | ORAL | Status: AC

## 2011-07-29 MED ORDER — GUAIFENESIN-CODEINE 100-10 MG/5ML PO SYRP: 5.0000 mL | ORAL_SOLUTION | Freq: Two times a day (BID) | ORAL | Status: AC | PRN

## 2011-07-29 NOTE — Telephone Encounter (Signed)
I was reviewing pt old notes from hospital. She had abnormal CT abdomen in 12/2010 which showed lesion in her lung.  It was recommended that she have a chest CT. Was this ever completed? I don't have any record of it?

## 2011-07-29 NOTE — Patient Instructions (Signed)
Rest. Drink plenty of fluids. Follow up if symptoms not improving over next 7 days.

## 2011-07-29 NOTE — Progress Notes (Signed)
Subjective:    Patient ID: Michelle Armstrong, female    DOB: [birth year], 50 y.o.   MRN: [id number]  HPI 50 year old female presents for an acute visit complaining of general malaise, sore throat, nasal congestion, fever, and chills over the last 3 days. She also notes a nonproductive cough. She reports that her husband was recently ill with similar symptoms. She has been using over-the-counter children's Tylenol with no improvement. She recently completed gastric bypass surgery and is unable to use nonsteroidal inflammatory drugs or any pills. She has to take her medications in liquid form. She denies any chest pain or shortness of breath. She does have some abdominal pain which is been present since her gastric bypass surgery and is currently being evaluated. She notes she had a recent upper endoscopy last week. She denies any nausea or vomiting. She denies any diarrhea.  Outpatient Encounter Prescriptions as of 07/29/2011  Medication Sig Dispense Refill  . Multiple Vitamins-Minerals (MULTIVITAMIN WITH MINERALS) tablet Take 1 tablet by mouth daily.        Marland Kitchen azithromycin (ZITHROMAX) 200 MG/5ML suspension Take 6.3 mLs (250 mg total) by mouth daily.  22.5 mL  0  . guaiFENesin-codeine (ROBITUSSIN AC) 100-10 MG/5ML syrup Take 5 mLs by mouth 2 (two) times daily as needed for cough.  240 mL  0    Review of Systems  Constitutional: Positive for fever, chills and fatigue. Negative for unexpected weight change.  HENT: Positive for ear pain, congestion, sore throat, rhinorrhea, sneezing and postnasal drip. Negative for hearing loss, nosebleeds, facial swelling, mouth sores, trouble swallowing, neck pain, neck stiffness, voice change, sinus pressure, tinnitus and ear discharge.   Eyes: Negative for pain, discharge, redness and visual disturbance.  Respiratory: Positive for cough. Negative for chest tightness, shortness of breath, wheezing and stridor.   Cardiovascular: Negative for chest pain,  palpitations and leg swelling.  Gastrointestinal: Positive for abdominal pain (since gastric bypass surgery). Negative for nausea, vomiting, diarrhea and constipation.  Musculoskeletal: Negative for myalgias and arthralgias.  Skin: Negative for color change and rash.  Neurological: Negative for dizziness, weakness, light-headedness and headaches.  Hematological: Negative for adenopathy.   BP 100/64  Pulse 95  Temp(Src) 99 F (37.2 C) (Oral)  Wt 170 lb (77.111 kg)  SpO2 94%     Objective:   Physical Exam  Constitutional: She is oriented to person, place, and time. She appears well-developed and well-nourished. She has a sickly appearance. No distress.  HENT:  Head: Normocephalic and atraumatic.  Right Ear: External ear normal. A middle ear effusion is present.  Left Ear: External ear normal. A middle ear effusion is present.  Nose: Nose normal.  Mouth/Throat: Oropharynx is clear and moist. No oropharyngeal exudate.  Eyes: Conjunctivae are normal. Pupils are equal, round, and reactive to light. Right eye exhibits no discharge. Left eye exhibits no discharge. No scleral icterus.  Neck: Normal range of motion. Neck supple. No tracheal deviation present. No thyromegaly present.  Cardiovascular: Normal rate, regular rhythm, normal heart sounds and intact distal pulses.  Exam reveals no gallop and no friction rub.   No murmur heard. Pulmonary/Chest: Effort normal and breath sounds normal. No respiratory distress. She has no wheezes. She has no rales. She exhibits no tenderness.  Musculoskeletal: Normal range of motion. She exhibits no edema and no tenderness.  Lymphadenopathy:    She has no cervical adenopathy.  Neurological: She is alert and oriented to person, place, and time. No cranial nerve deficit. She exhibits normal muscle tone.  Coordination normal.  Skin: Skin is warm and dry. No rash noted. She is not diaphoretic. No erythema. No pallor.  Psychiatric: She has a normal mood and  affect. Her behavior is normal. Judgment and thought content normal.          Assessment & Plan:  1. Viral URI - her symptoms and clinical exam are consistent with viral upper respiratory infection. We discussed supportive care including Tylenol and Sudafed and Mucinex. I have called her in codeine-based cough syrup. We discussed that she will need to rest and increase her fluid intake. I encouraged her to stay home from work. We discussed that if her symptoms are persistent or not improving over the next 72 hours she can start azithromycin. I will call this in a liquid form for her. She will call if symptoms are worsening or if symptoms are not improving.

## 2011-07-30 NOTE — Telephone Encounter (Signed)
Order completed

## 2011-07-30 NOTE — Telephone Encounter (Signed)
We should definitely follow this up, esp in light of her current symptoms.  CT chest without contrast would be fine.

## 2011-07-30 NOTE — Telephone Encounter (Signed)
Pt says she was told by ER that it was "nothing" and she didn't need f/u. I advised patient that we would be ordering CT chest for f/u, she is agreeable. Please order CT or let me know details, W/Contrast?

## 2011-07-31 ENCOUNTER — Other Ambulatory Visit: Payer: Self-pay | Admitting: Internal Medicine

## 2011-08-07 ENCOUNTER — Telehealth: Payer: Self-pay | Admitting: Internal Medicine

## 2011-08-07 NOTE — Telephone Encounter (Signed)
I spoke with Dr. Dan Humphreys via text message and she said w/o contrast for her CT Chest.  I have sent order to [facility] as it was scheduled.

## 2011-08-09 ENCOUNTER — Telehealth: Payer: Self-pay | Admitting: Internal Medicine

## 2011-08-09 ENCOUNTER — Ambulatory Visit: Payer: Self-pay | Admitting: Internal Medicine

## 2011-08-09 DIAGNOSIS — R911 Solitary pulmonary nodule: Secondary | ICD-10-CM

## 2011-08-09 NOTE — Telephone Encounter (Signed)
Patient of Dr. Sonny Dandy,  Had a CT of chest done to follow up on some abnormalities seen on a previous one .  The repeat CT done without contrast showed 4 tiny (less than 3 mm ) nodules in the lungs, and the previously noted lesion on the left side is much less conspicuous. I would really like to defer any additional action or interpretation until Dr. Dan Humphreys has a chance to review it because it was done without contrast and that may be why the previously seen cavitary lesion is less conspicuous.

## 2011-08-09 NOTE — Telephone Encounter (Signed)
Discussed w/patient and she is very worried. She would like to know what Dr Dan Humphreys advises asap.

## 2011-08-11 NOTE — Telephone Encounter (Signed)
I was doing a non-contrast CT chest at recommendation of radiology to follow up an abnormal CT chest performed by another doctor.  Please bring her in for visit to discuss.

## 2011-08-12 ENCOUNTER — Telehealth: Payer: Self-pay | Admitting: Internal Medicine

## 2011-08-12 NOTE — Telephone Encounter (Signed)
Patient call and wanted you to send results to follow doctors Please send copy of ct scan that pt had done Friday chest abd and pelvic  To dr Ricki Rodriguez  (GI) and [facility]      And [phone] fax # dr Dorothea Ogle

## 2011-08-12 NOTE — Telephone Encounter (Signed)
Pt had called office earlier today. She says she is a "big girl" and can handle any results over the phone. She also has no time that she is able to take off of work for an apt.

## 2011-08-12 NOTE — Telephone Encounter (Signed)
OK. Essentially the cavitary area was much smaller, so may have represented infection which has cleared. There were a few small nodules that we should probably follow with another CT in 3 months. These may be related to infection or scar tissue.

## 2011-08-13 NOTE — Telephone Encounter (Signed)
Patient informed, She has 5,000 out of pocket that she must meet each year and already is stressed about past bills. She says she can not do the f/u CT in 3 mths due to the cost. Patient would like anything else that needs to be done to be ordered now. I explained that doing a repeat CT in December would be too early to f/u. She researched online and says b/c of the multiple nodules she should have a PET Scan and biopsy of abnormal areas, which she wants this year. I advised her that I would let Dr Dan Humphreys know her requests but not sure she would agree to a PET scan and may require a f/u OV. Pt is very worried and wants to r/o cancer.

## 2011-08-13 NOTE — Telephone Encounter (Signed)
Completed.

## 2011-08-13 NOTE — Telephone Encounter (Signed)
The nodules are very small and likely not cancer. We can set her up with the [facility] cancer center (Dr. Thelma Barge) for evaluation if she would like. They can make decision about necessity of PET.

## 2011-08-13 NOTE — Telephone Encounter (Signed)
Left mess to call office back.   

## 2011-08-13 NOTE — Telephone Encounter (Signed)
Patient informed and is agreeable to having oncology referral. Referral entered.

## 2011-08-20 LAB — HM DEXA SCAN

## 2011-08-21 LAB — HM DEXA SCAN: HM Dexa Scan: NORMAL

## 2011-08-21 LAB — HM COLONOSCOPY: HM Colonoscopy: NORMAL

## 2011-08-28 ENCOUNTER — Ambulatory Visit: Payer: Self-pay | Admitting: Oncology

## 2011-08-30 ENCOUNTER — Ambulatory Visit: Payer: Self-pay | Admitting: Gastroenterology

## 2011-09-02 ENCOUNTER — Ambulatory Visit: Payer: Self-pay | Admitting: Internal Medicine

## 2011-09-06 ENCOUNTER — Ambulatory Visit: Payer: Self-pay | Admitting: Oncology

## 2011-09-11 ENCOUNTER — Encounter: Payer: Self-pay | Admitting: Internal Medicine

## 2011-09-17 ENCOUNTER — Ambulatory Visit: Payer: Self-pay | Admitting: Oncology

## 2011-10-02 ENCOUNTER — Encounter: Payer: Self-pay | Admitting: Internal Medicine

## 2012-01-16 LAB — HM PAP SMEAR: HM Pap smear: NORMAL

## 2012-02-04 LAB — HM MAMMOGRAPHY: HM Mammogram: NORMAL

## 2012-08-20 ENCOUNTER — Encounter: Payer: Self-pay | Admitting: Internal Medicine

## 2012-08-20 ENCOUNTER — Ambulatory Visit (INDEPENDENT_AMBULATORY_CARE_PROVIDER_SITE_OTHER): Payer: 59 | Admitting: Internal Medicine

## 2012-08-20 VITALS — BP 124/76 | HR 80 | Temp 98.0°F | Resp 16 | Ht 68.0 in | Wt 142.8 lb

## 2012-08-20 DIAGNOSIS — Z0000 Encounter for general adult medical examination without abnormal findings: Secondary | ICD-10-CM | POA: Insufficient documentation

## 2012-08-20 NOTE — Progress Notes (Signed)
Subjective:    Patient ID: Michelle Armstrong, female    DOB: November 25, 1960, 51 y.o.   MRN: [id number]  HPI 51 year old female with history of obesity status post gastric bypass surgery and SVT status post ablation presents for general medical exam. She reports she is generally feeling well. She no longer has any abdominal discomfort after gastric bypass surgery. She does have some diaphoresis when she eats a large amount of carbohydrates. She has been able to maintain her weight near 140 over the last year. She denies any recurrent episodes of palpitations or any other concerns today. She brings in recent labs performed by her employer which showed normal kidney and liver function, normal cholesterol, normal thyroid function. Her pre-albumin level was noted to be slightly low however stable from last year.  Outpatient Encounter Prescriptions as of 08/20/2012  Medication Sig Dispense Refill  . Multiple Vitamins-Minerals (MULTIVITAMIN WITH MINERALS) tablet Take 1 tablet by mouth daily.         BP 124/76  Pulse 80  Temp 98 F (36.7 C) (Oral)  Resp 16  Ht 5\' 8"  (1.727 m)  Wt 142 lb 12 oz (64.751 kg)  BMI 21.71 kg/m2  SpO2 97%  Review of Systems  Constitutional: Negative for fever, chills, appetite change, fatigue and unexpected weight change.  HENT: Negative for ear pain, congestion, sore throat, trouble swallowing, neck pain, voice change and sinus pressure.   Eyes: Negative for visual disturbance.  Respiratory: Negative for cough, shortness of breath, wheezing and stridor.   Cardiovascular: Negative for chest pain, palpitations and leg swelling.  Gastrointestinal: Negative for nausea, vomiting, abdominal pain, diarrhea, constipation, blood in stool, abdominal distention and anal bleeding.  Genitourinary: Negative for dysuria and flank pain.  Musculoskeletal: Negative for myalgias, arthralgias and gait problem.  Skin: Negative for color change and rash.  Neurological: Negative for  dizziness and headaches.  Hematological: Negative for adenopathy. Does not bruise/bleed easily.  Psychiatric/Behavioral: Negative for suicidal ideas, sleep disturbance and dysphoric mood. The patient is not nervous/anxious.        Objective:   Physical Exam  Constitutional: She is oriented to person, place, and time. She appears well-developed and well-nourished. No distress.  HENT:  Head: Normocephalic and atraumatic.  Right Ear: External ear normal.  Left Ear: External ear normal.  Nose: Nose normal.  Mouth/Throat: Oropharynx is clear and moist. No oropharyngeal exudate.  Eyes: Conjunctivae normal are normal. Pupils are equal, round, and reactive to light. Right eye exhibits no discharge. Left eye exhibits no discharge. No scleral icterus.  Neck: Normal range of motion. Neck supple. No tracheal deviation present. No thyromegaly present.  Cardiovascular: Normal rate, regular rhythm, normal heart sounds and intact distal pulses.  Exam reveals no gallop and no friction rub.   No murmur heard. Pulmonary/Chest: Effort normal and breath sounds normal. No respiratory distress. She has no wheezes. She has no rales. She exhibits no tenderness.  Abdominal: Soft. Bowel sounds are normal. She exhibits no distension and no mass. There is no tenderness. There is no rebound and no guarding.  Musculoskeletal: Normal range of motion. She exhibits no edema and no tenderness.  Lymphadenopathy:    She has no cervical adenopathy.  Neurological: She is alert and oriented to person, place, and time. No cranial nerve deficit. She exhibits normal muscle tone. Coordination normal.  Skin: Skin is warm and dry. No rash noted. She is not diaphoretic. No erythema. No pallor.  Psychiatric: She has a normal mood and affect. Her behavior is  normal. Judgment and thought content normal.          Assessment & Plan:

## 2012-08-20 NOTE — Assessment & Plan Note (Signed)
General medical exam is normal today. Breast exam, Pelvic exam and Pap are deferred as patient recently had these completed with her OB/GYN. Health maintenance is up to date. Reviewed recent labs which show normal kidney and liver function and normal cholesterol. Encouraged patient to continue efforts at healthy diet and regular physical activity. Plan for followup in one year or sooner as needed.

## 2012-08-21 ENCOUNTER — Telehealth: Payer: Self-pay | Admitting: Internal Medicine

## 2012-08-21 NOTE — Telephone Encounter (Signed)
Pt wanted to know if she could have some allergy testing done The test she wants are Food [phone] Food [phone]  inhaler [phone]

## 2012-08-21 NOTE — Telephone Encounter (Signed)
???   She did not mention this yesterday.  Will need an appointment to discuss if allergies an issue

## 2012-08-24 NOTE — Telephone Encounter (Signed)
Pt needs an appt to discuss allergy testing. 15 minute only

## 2012-08-24 NOTE — Telephone Encounter (Signed)
She should have her supervisor order this through employee health.

## 2012-08-24 NOTE — Telephone Encounter (Signed)
Pt made the appt for 12/23. 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 Also states would like for this to be done before 12/12 when the equipment in their office changes and they no longer can do the testing. Can she get the tests done and still keep the appt with you to go over results? Please call pt back on cell phone to let her know whether or not she can come in tomorrow to get blood drawn.

## 2012-08-25 NOTE — Telephone Encounter (Signed)
Pt states that she will just have another provider draw the blood for the allergy testing.

## 2012-09-07 ENCOUNTER — Ambulatory Visit: Payer: 59 | Admitting: Internal Medicine

## 2012-11-03 ENCOUNTER — Telehealth: Payer: Self-pay | Admitting: Internal Medicine

## 2012-11-03 NOTE — Telephone Encounter (Signed)
Received labs from [employer] ordered by [employer].  Labs show multiple allergies both to food products and outdoor allergens. Has this been addressed?

## 2012-11-03 NOTE — Telephone Encounter (Signed)
Left message on voice mail  to call back

## 2012-11-03 NOTE — Telephone Encounter (Signed)
Spoke to patient and was advised that her GYN ordered this test. Patient states that the test have not been addressed and wants to know if you think that she should see an allergist? Patient states that she will call and schedule an appointment if you will let her know who you would recommend. Patient states that a message can be left on her voicemail with the information/

## 2012-11-03 NOTE — Telephone Encounter (Signed)
First, she should probably discuss with the provider who ordered them. Second, yes, she should see an Allergist. The GYN who ordered this testing can make this referral, or we can see her in a visit and then make referral.

## 2012-11-03 NOTE — Telephone Encounter (Signed)
Pt returning call to [place].  Pt states she had dropped off allergy test results that she just wants to be placed in her file/chart.  That is all she wants and states she does not need to speak with anyone.  Pt states you can leave her a voicemail if needed but she is at work and cannot accept phone calls.

## 2012-11-04 NOTE — Telephone Encounter (Signed)
Patient returned call and stated that is fine she will let her GYN know that.

## 2012-11-04 NOTE — Telephone Encounter (Signed)
LMTCB

## 2013-07-02 ENCOUNTER — Telehealth: Payer: Self-pay | Admitting: Internal Medicine

## 2013-07-02 NOTE — Telephone Encounter (Signed)
Patient aware.

## 2013-07-02 NOTE — Telephone Encounter (Signed)
We cannot call in antibiotic without seeing a patient. She will need to be evaluated in [place].

## 2013-07-02 NOTE — Telephone Encounter (Signed)
Pt is out of town in [place].  States she has an abscessed tooth, is scheduled to see dentist 10/27.  Dentist office has closed and pt is asking for something she can take Pharmacy [phone], [facility].  Needs a liquid form, not good with pills.

## 2013-07-02 NOTE — Telephone Encounter (Signed)
Called patient, she does not want to take Tylenol or Ibuprofen, would like an antibiotic sent to pharmacy.

## 2014-01-10 LAB — BASIC METABOLIC PANEL
BUN: 19 mg/dL (ref 4–21)
CREATININE: 0.8 mg/dL (ref 0.5–1.1)
Glucose: 88 mg/dL
Potassium: 4.3 mmol/L (ref 3.4–5.3)
SODIUM: 143 mmol/L (ref 137–147)

## 2014-01-10 LAB — CBC AND DIFFERENTIAL
HCT: 42 % (ref 36–46)
Hemoglobin: 13.9 g/dL (ref 12.0–16.0)
NEUTROS ABS: 2 /uL
Platelets: 219 10*3/uL (ref 150–399)
WBC: 4.1 10^3/mL

## 2014-01-10 LAB — HEPATIC FUNCTION PANEL
ALK PHOS: 87 U/L (ref 25–125)
ALT: 29 U/L (ref 7–35)
AST: 26 U/L (ref 13–35)
BILIRUBIN, TOTAL: 0.8 mg/dL

## 2014-01-10 LAB — HEMOGLOBIN A1C: Hgb A1c MFr Bld: 5.5 % (ref 4.0–6.0)

## 2014-01-10 LAB — LIPID PANEL
Cholesterol: 188 mg/dL (ref 0–200)
HDL: 106 mg/dL — AB (ref 35–70)
TRIGLYCERIDES: 69 mg/dL (ref 40–160)

## 2014-02-11 LAB — HM MAMMOGRAPHY: HM MAMMO: NEGATIVE

## 2014-03-29 LAB — LIPID PANEL
Cholesterol: 215 mg/dL — AB (ref 0–200)
HDL: 107 mg/dL — AB (ref 35–70)
LDL Cholesterol: 94 mg/dL
Triglycerides: 72 mg/dL (ref 40–160)

## 2014-03-29 LAB — HEMOGLOBIN A1C: Hgb A1c MFr Bld: 5.6 % (ref 4.0–6.0)

## 2014-03-29 LAB — BASIC METABOLIC PANEL
CREATININE: 0.8 mg/dL (ref 0.5–1.1)
Glucose: 85 mg/dL

## 2014-04-19 ENCOUNTER — Telehealth: Payer: Self-pay | Admitting: Internal Medicine

## 2014-04-19 ENCOUNTER — Ambulatory Visit (INDEPENDENT_AMBULATORY_CARE_PROVIDER_SITE_OTHER): Payer: 59 | Admitting: Family Medicine

## 2014-04-19 ENCOUNTER — Encounter: Payer: Self-pay | Admitting: *Deleted

## 2014-04-19 ENCOUNTER — Encounter: Payer: Self-pay | Admitting: Family Medicine

## 2014-04-19 VITALS — BP 114/70 | HR 68 | Temp 98.5°F | Ht 67.5 in

## 2014-04-19 DIAGNOSIS — J069 Acute upper respiratory infection, unspecified: Secondary | ICD-10-CM

## 2014-04-19 DIAGNOSIS — T753XXA Motion sickness, initial encounter: Secondary | ICD-10-CM

## 2014-04-19 DIAGNOSIS — B9789 Other viral agents as the cause of diseases classified elsewhere: Secondary | ICD-10-CM

## 2014-04-19 MED ORDER — PROMETHAZINE HCL 25 MG PO TABS: 25.0000 mg | ORAL_TABLET | Freq: Three times a day (TID) | ORAL | Status: DC | PRN

## 2014-04-19 MED ORDER — BENZONATATE 200 MG PO CAPS: 200.0000 mg | ORAL_CAPSULE | Freq: Two times a day (BID) | ORAL | Status: DC | PRN

## 2014-04-19 MED ORDER — HYDROCODONE-HOMATROPINE 5-1.5 MG/5ML PO SYRP: 5.0000 mL | ORAL_SOLUTION | Freq: Every evening | ORAL | Status: DC | PRN

## 2014-04-19 NOTE — Progress Notes (Signed)
   Subjective:    Patient ID: Michelle Armstrong, female    DOB: 1961/05/24, 53 y.o.   MRN: [id number]  Sinus Problem This is a new problem. The current episode started in the past 7 days. The problem has been gradually worsening since onset. There has been no fever. Associated symptoms include chills, congestion, coughing, ear pain and a sore throat. Pertinent negatives include no shortness of breath or sinus pressure. (Fatigue Chest pressure Eyes itchy and discharge. Cannot sleep at night due to cough) Past treatments include acetaminophen. The treatment provided no relief.    She has dinner cruise up coming on boat will last few hours.  She gets nauseous on boats. She request phenergan 2 tabs to treat.   Rash resolving on upper buttock after neosporin and steroid OTC.  Has labs to scan in... Told to bring to PCP office.    Review of Systems  Constitutional: Positive for chills.  HENT: Positive for congestion, ear pain and sore throat. Negative for sinus pressure.   Respiratory: Positive for cough. Negative for shortness of breath.        Objective:   Physical Exam  Constitutional: Vital signs are normal. She appears well-developed and well-nourished. She is cooperative.  Non-toxic appearance. She does not appear ill. No distress.  HENT:  Head: Normocephalic.  Right Ear: Hearing, tympanic membrane, external ear and ear canal normal. Tympanic membrane is not erythematous, not retracted and not bulging.  Left Ear: Hearing, tympanic membrane, external ear and ear canal normal. Tympanic membrane is not erythematous, not retracted and not bulging.  Nose: Mucosal edema and rhinorrhea present. Right sinus exhibits no maxillary sinus tenderness and no frontal sinus tenderness. Left sinus exhibits no maxillary sinus tenderness and no frontal sinus tenderness.  Mouth/Throat: Uvula is midline, oropharynx is clear and moist and mucous membranes are normal.  Eyes: Conjunctivae, EOM and  lids are normal. Pupils are equal, round, and reactive to light. Lids are everted and swept, no foreign bodies found.  Neck: Trachea normal and normal range of motion. Neck supple. Carotid bruit is not present. No mass and no thyromegaly present.  Cardiovascular: Normal rate, regular rhythm, S1 normal, S2 normal, normal heart sounds, intact distal pulses and normal pulses.  Exam reveals no gallop and no friction rub.   No murmur heard. Pulmonary/Chest: Effort normal and breath sounds normal. Not tachypneic. No respiratory distress. She has no decreased breath sounds. She has no wheezes. She has no rhonchi. She has no rales.  Neurological: She is alert.  Skin: Skin is warm, dry and intact. No rash noted.  Acne on face, mild erythema in gluteal crease  Psychiatric: Her speech is normal and behavior is normal. Judgment normal. Her mood appears not anxious. Cognition and memory are normal. She does not exhibit a depressed mood.          Assessment & Plan:

## 2014-04-19 NOTE — Assessment & Plan Note (Signed)
Pt refused scopolamine patch. Will provide 2 tabs of phenergan.

## 2014-04-19 NOTE — Patient Instructions (Addendum)
Start cough suppressant at night. Push fluids. Rest as able, stay out of work for next 2 days. Schedule appt with  PCP for complete physical given not seen in last 2 years.

## 2014-04-19 NOTE — Assessment & Plan Note (Signed)
Symptom care. 

## 2014-04-19 NOTE — Progress Notes (Signed)
Pre visit review using our clinic review tool, if applicable. No additional management support is needed unless otherwise documented below in the visit note. 

## 2014-04-19 NOTE — Telephone Encounter (Signed)
Pt came in and dropped off her lab results. Put in Dr. Thomes Dinning box.

## 2014-04-20 NOTE — Telephone Encounter (Signed)
Spoke with Rober Minion office manager and advised to send CAN note to Dr Gilford Rile; spoke with Lorriane Shire at [facility] and advised sent not to Dr Gilford Rile; Lorriane Shire advised me to send note to [facility] as well.

## 2014-04-20 NOTE — Telephone Encounter (Signed)
Caller: Michelle Armstrong/Patient; Phone: [phone]; Reason for Call: Pt f/u regarding Sinus Pressure/Cough.  Pt was evaluated by Dr Diona Browner on 8-4.  Pt states, is in no condition to drive back to office for f/u appt per Dr Thomes Dinning advice.  Pt has Headache w/ Sinus Pressure, green discharge w/ Productive Cough, green Sputum.  Pt has tactile temp.  Pt is asking for MD to please call in Abx d/t sxs worsen since eval and Pt must go back to work asap.  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  Pt aware Dr Diona Browner is not in office.  Please review w/ Dr Gilford Rile and f/u w/ Pt.  Pt uses [facility], [employer].

## 2014-04-20 NOTE — Telephone Encounter (Signed)
I reviewed note from Dr. Diona Browner who felt that pt had a viral infection. We will need to set up a follow up visit in clinic to evaluate her if her symptoms are not improving.

## 2014-04-20 NOTE — Telephone Encounter (Signed)
Patient Information:  Caller Name: [place]  Phone: [phone]  Patient: Michelle Armstrong  Gender: Female  DOB: 04-17-1961  Age: 53 Years  PCP: Ronette Deter (Adults only)  Pregnant: No  Office Follow Up:  Does the office need to follow up with this patient?: Yes  Instructions For The Office: Pt. is calling back. Was in the office on 04/19/14 and is not getting better. Pt is crying and states she is sure she has a sinus infection because of severe headache and facial pressure and green drainage. Please call this pt.  RN Note:  Pt. is crying and upset. States she has a sinus infection and needs an antibiotic to get well. Continues to cough and spit up drainage. Drainage from the nose is green. Please call this pt. to follow-up.  Symptoms  Reason For Call & Symptoms: Pt. in the office on 04/19/14.Calling to report severe headache and sinus pressure with green drainage. Pt. is crying and states she feels like she needs an antibiotic. Took the Phenergan and did sleep some last night.  Reviewed Health History In EMR: Yes  Reviewed Medications In EMR: Yes  Reviewed Allergies In EMR: Yes  Reviewed Surgeries / Procedures: Yes  Date of Onset of Symptoms: 04/19/2014  Treatments Tried: Tessalon, Hycodan, Phenergan  Treatments Tried Worked: No OB / GYN:  LMP: Unknown  Guideline(s) Used:  No Protocol Available - Sick Adult  Disposition Per Guideline:   Discuss with PCP and Callback by Nurse within 1 Hour  Reason For Disposition Reached:   Nursing judgment  Advice Given:  Call Back If:  New symptoms develop  You become worse.  Patient Will Follow Care Advice:  YES

## 2014-04-20 NOTE — Telephone Encounter (Signed)
She was seen yesterday and diagnosed with a viral infection. Congestion and headache can be part of a viral infection. I would recommend using over the counter Mucinex DM to help with symptoms. If symptoms are persistent, we can see her in clinic and evaluate tomorrow.

## 2014-04-21 NOTE — Telephone Encounter (Signed)
Pt called back and states that she is having sinus pressure w/ congestion, green drainage and non-productive cough.  I explained that Dr. Gilford Rile had addressed the call from yesterday and recommended Mucinex DM.  She spoke with Dr. Thomes Dinning office this am and they offered office visit, however pt states no transportation and just wants antibiotic.  Spoke with Rena Isley,LPN, and she concurred that office visit was needed.  Pt pleasant, but adamant that cannot come in this morning at our office for a recheck, just wanted rx called in.  Assured pt that I would convey call to Dr. Diona Browner, but that she would need to go to her PCP unless they do not have an open appointment slot available.

## 2014-04-21 NOTE — Telephone Encounter (Signed)
Agreed. At recent appt felt viral infection. No antibiotics indicated at this time. Worsening was expected and discussed with pt as she is early in viral time course.  If she feels she has severe symptoms... She needs to be seen as already recommended.

## 2014-04-21 NOTE — Telephone Encounter (Signed)
Spoke with pt this morning.  Advised pt of Dr Derry Skill notes, she states she would like an antibiotic called to pharmacy, Advised pt that she would need to be seen.  Pt states that she has not tried any over the counter medication and she will try Mucinex DM.

## 2014-04-22 ENCOUNTER — Ambulatory Visit (INDEPENDENT_AMBULATORY_CARE_PROVIDER_SITE_OTHER): Payer: 59 | Admitting: Internal Medicine

## 2014-04-22 ENCOUNTER — Encounter: Payer: Self-pay | Admitting: Internal Medicine

## 2014-04-22 ENCOUNTER — Telehealth: Payer: Self-pay | Admitting: *Deleted

## 2014-04-22 VITALS — BP 100/68 | HR 75 | Temp 97.9°F | Ht 67.5 in

## 2014-04-22 DIAGNOSIS — J209 Acute bronchitis, unspecified: Secondary | ICD-10-CM | POA: Insufficient documentation

## 2014-04-22 MED ORDER — LEVOFLOXACIN 500 MG PO TABS: 500.0000 mg | ORAL_TABLET | Freq: Every day | ORAL | Status: DC

## 2014-04-22 NOTE — Assessment & Plan Note (Signed)
Symptoms most consistent with viral URI with secondary bronchitis. Will start Levaquin. Continue Mucinex DM, Tylenol and Ibuprofen prn. Encouraged rest and adequate fluid intake. Follow up Monday by phone or email and in 1 week. If no improvement, favor getting CXR.

## 2014-04-22 NOTE — Patient Instructions (Signed)
Start Levaquin 500mg  daily x 7 days to help treat sinus and lung infection.  Continue Mucinex DM twice daily as needed.  Continue Tessalon for cough.  Call or email Monday with update. If no significant improvement, we will get a chest xray.

## 2014-04-22 NOTE — Telephone Encounter (Signed)
Levaquin can prolong the QT interval and should not be given to patients with h/o prolonged QT or patients on medications that prolong the QT. I reviewed her last EKG and it was normal. I think the Levaquin should be fine to use.

## 2014-04-22 NOTE — Progress Notes (Signed)
Pre visit review using our clinic review tool, if applicable. No additional management support is needed unless otherwise documented below in the visit note. 

## 2014-04-22 NOTE — Telephone Encounter (Signed)
Pt called requesting whether it is safe to take Levaquin due to her history of SVT.  She states she read online that you should not take Levaquin with heart arrhythmias.  Please advise

## 2014-04-22 NOTE — Telephone Encounter (Signed)
Spoke with pt advised of MDs message 

## 2014-04-22 NOTE — Progress Notes (Signed)
Subjective:    Patient ID: Michelle Armstrong, female    DOB: 13-Jan-1961, 53 y.o.   MRN: [id number]  HPI 53YO female presents for acute visit.  Seen on 8/4 at [facility]. Diagnosed with viral URI.  Last week, developed nasal congestion and itchy throat after exposure to cat. This week, congestion, dizziness, bilateral sinus pressure/pain. Some ear pressure bilaterally. Pain in lymph nodes in neck. Sore throat. Having some nausea and vomiting with cough. No improvement with Afrin, Rx cough syrup. Now having some shortness of breath. Very poor PO intake.    Review of Systems  Constitutional: Positive for chills and fatigue. Negative for fever and unexpected weight change.  HENT: Positive for congestion, ear pain, postnasal drip, rhinorrhea, sinus pressure and sore throat. Negative for ear discharge, facial swelling, hearing loss, mouth sores, nosebleeds, sneezing, tinnitus, trouble swallowing and voice change.   Eyes: Negative for pain, discharge, redness and visual disturbance.  Respiratory: Positive for cough and shortness of breath. Negative for chest tightness, wheezing and stridor.   Cardiovascular: Negative for chest pain, palpitations and leg swelling.  Gastrointestinal: Positive for nausea, vomiting and constipation. Negative for abdominal pain.  Musculoskeletal: Negative for arthralgias, myalgias, neck pain and neck stiffness.  Skin: Negative for color change and rash.  Neurological: Negative for dizziness, weakness, light-headedness and headaches.  Hematological: Negative for adenopathy.  Psychiatric/Behavioral: Positive for sleep disturbance.       Objective:    BP 100/68  Pulse 75  Temp(Src) 97.9 F (36.6 C) (Oral)  Ht 5' 7.5" (1.715 m)  SpO2 96% Physical Exam  Constitutional: She is oriented to person, place, and time. She appears well-developed and well-nourished. No distress.  HENT:  Head: Normocephalic and atraumatic.  Right Ear: Tympanic membrane and  external ear normal.  Left Ear: Tympanic membrane and external ear normal.  Nose: Mucosal edema present.  Mouth/Throat: Posterior oropharyngeal erythema present. No oropharyngeal exudate.  Eyes: Conjunctivae are normal. Pupils are equal, round, and reactive to light. Right eye exhibits no discharge. Left eye exhibits no discharge. No scleral icterus.  Neck: Normal range of motion. Neck supple. No tracheal deviation present. No thyromegaly present.  Cardiovascular: Normal rate, regular rhythm, normal heart sounds and intact distal pulses.  Exam reveals no gallop and no friction rub.   No murmur heard. Pulmonary/Chest: Effort normal. No accessory muscle usage. Not tachypneic. No respiratory distress. She has no decreased breath sounds. She has no wheezes. She has rhonchi in the right middle field. She has no rales. She exhibits no tenderness.  Musculoskeletal: Normal range of motion. She exhibits no edema and no tenderness.  Lymphadenopathy:    She has no cervical adenopathy.  Neurological: She is alert and oriented to person, place, and time. No cranial nerve deficit. She exhibits normal muscle tone. Coordination normal.  Skin: Skin is warm and dry. No rash noted. She is not diaphoretic. No erythema. No pallor.  Psychiatric: She has a normal mood and affect. Her behavior is normal. Judgment and thought content normal.          Assessment & Plan:   Problem List Items Addressed This Visit     Unprioritized   Acute bronchitis - Primary     Symptoms most consistent with viral URI with secondary bronchitis. Will start Levaquin. Continue Mucinex DM, Tylenol and Ibuprofen prn. Encouraged rest and adequate fluid intake. Follow up Monday by phone or email and in 1 week. If no improvement, favor getting CXR.    Relevant Medications  levofloxacin (LEVAQUIN) tablet       Return in about 1 week (around 04/29/2014) for Recheck.

## 2014-06-15 ENCOUNTER — Encounter: Payer: Self-pay | Admitting: Internal Medicine

## 2014-06-15 ENCOUNTER — Encounter: Payer: Self-pay | Admitting: *Deleted

## 2014-06-15 ENCOUNTER — Ambulatory Visit (INDEPENDENT_AMBULATORY_CARE_PROVIDER_SITE_OTHER): Payer: 59 | Admitting: Internal Medicine

## 2014-06-15 VITALS — BP 108/72 | HR 77 | Temp 98.1°F | Resp 14 | Ht 67.5 in | Wt 145.0 lb

## 2014-06-15 DIAGNOSIS — Z0000 Encounter for general adult medical examination without abnormal findings: Secondary | ICD-10-CM

## 2014-06-15 DIAGNOSIS — T753XXA Motion sickness, initial encounter: Secondary | ICD-10-CM

## 2014-06-15 DIAGNOSIS — T753XXD Motion sickness, subsequent encounter: Secondary | ICD-10-CM

## 2014-06-15 DIAGNOSIS — Z5189 Encounter for other specified aftercare: Secondary | ICD-10-CM

## 2014-06-15 MED ORDER — MECLIZINE HCL 25 MG PO TABS: 25.0000 mg | ORAL_TABLET | Freq: Three times a day (TID) | ORAL | Status: DC | PRN

## 2014-06-15 NOTE — Progress Notes (Signed)
Subjective:    Patient ID: Michelle Armstrong, female    DOB: 1961/07/21, 53 y.o.   MRN: [id number]  HPI 53YO female presents for annual exam.  Feeling well. No concerns today.  Planning a trip on a boat and would like to get Rx for Meclizine for motion sickness. Continues to follow a healthy, high protein diet.  Physically active. No changes to family history or medical history. Mammogram recently completed at [facility].  Review of Systems  Constitutional: Negative for fever, chills, appetite change, fatigue and unexpected weight change.  Eyes: Negative for visual disturbance.  Respiratory: Negative for shortness of breath.   Cardiovascular: Negative for chest pain and leg swelling.  Gastrointestinal: Positive for constipation (occasional). Negative for nausea, vomiting, abdominal pain and diarrhea.  Musculoskeletal: Negative for arthralgias and myalgias.  Skin: Negative for color change and rash.  Hematological: Negative for adenopathy. Does not bruise/bleed easily.  Psychiatric/Behavioral: Negative for sleep disturbance and dysphoric mood. The patient is not nervous/anxious.        Objective:    BP 108/72  Pulse 77  Temp(Src) 98.1 F (36.7 C) (Oral)  Resp 14  Ht 5' 7.5" (1.715 m)  Wt 145 lb (65.772 kg)  BMI 22.36 kg/m2  SpO2 98% Physical Exam  Constitutional: She is oriented to person, place, and time. She appears well-developed and well-nourished. No distress.  HENT:  Head: Normocephalic and atraumatic.  Right Ear: External ear normal.  Left Ear: External ear normal.  Nose: Nose normal.  Mouth/Throat: Oropharynx is clear and moist. No oropharyngeal exudate.  Eyes: Conjunctivae are normal. Pupils are equal, round, and reactive to light. Right eye exhibits no discharge. Left eye exhibits no discharge. No scleral icterus.  Neck: Normal range of motion. Neck supple. No tracheal deviation present. No thyromegaly present.  Cardiovascular: Normal rate, regular  rhythm, normal heart sounds and intact distal pulses.  Exam reveals no gallop and no friction rub.   No murmur heard. Pulmonary/Chest: Effort normal and breath sounds normal. No accessory muscle usage. Not tachypneic. No respiratory distress. She has no decreased breath sounds. She has no wheezes. She has no rhonchi. She has no rales. She exhibits no tenderness. Right breast exhibits no inverted nipple, no mass, no nipple discharge, no skin change and no tenderness. Left breast exhibits no inverted nipple, no mass, no nipple discharge, no skin change and no tenderness. Breasts are symmetrical.  Abdominal: Soft. Bowel sounds are normal. She exhibits no distension and no mass. There is no tenderness. There is no rebound and no guarding.  Musculoskeletal: Normal range of motion. She exhibits no edema and no tenderness.  Lymphadenopathy:    She has no cervical adenopathy.  Neurological: She is alert and oriented to person, place, and time. No cranial nerve deficit. She exhibits normal muscle tone. Coordination normal.  Skin: Skin is warm and dry. No rash noted. She is not diaphoretic. No erythema. No pallor.  Psychiatric: She has a normal mood and affect. Her behavior is normal. Judgment and thought content normal.          Assessment & Plan:   Problem List Items Addressed This Visit     Unprioritized   Routine general medical examination at a health care facility - Primary     General medical exam normal today including breast exam. PAP and pelvic deferred as pt s/p hysterectomy. Will request recent mammogram report. Colonoscopy UTD. Flu vaccine declined. Reviewed recent labs from her work showing normal cholesterol, kidney function, blood sugars.  Encouraged healthy diet and exercise. Follow up 1 year and prn.     Other Visit Diagnoses   Motion sickness, subsequent encounter        Relevant Medications       meclizine (ANTIVERT) tablet        Return in about 1 year (around 06/16/2015)  for Physical.

## 2014-06-15 NOTE — Progress Notes (Signed)
Pre visit review using our clinic review tool, if applicable. No additional management support is needed unless otherwise documented below in the visit note. 

## 2014-06-15 NOTE — Assessment & Plan Note (Signed)
General medical exam normal today including breast exam. PAP and pelvic deferred as pt s/p hysterectomy. Will request recent mammogram report. Colonoscopy UTD. Flu vaccine declined. Reviewed recent labs from her work showing normal cholesterol, kidney function, blood sugars. Encouraged healthy diet and exercise. Follow up 1 year and prn.

## 2014-06-15 NOTE — Patient Instructions (Signed)

## 2014-06-17 ENCOUNTER — Encounter: Payer: Self-pay | Admitting: *Deleted

## 2014-06-20 ENCOUNTER — Encounter: Payer: Self-pay | Admitting: *Deleted

## 2014-09-16 DIAGNOSIS — M8580 Other specified disorders of bone density and structure, unspecified site: Secondary | ICD-10-CM

## 2014-09-16 HISTORY — DX: Other specified disorders of bone density and structure, unspecified site: M85.80

## 2014-11-07 LAB — LIPID PANEL
CHOLESTEROL: 220 mg/dL — AB (ref 0–200)
HDL: 119 mg/dL — AB (ref 35–70)
LDL Cholesterol: 91 mg/dL
Triglycerides: 51 mg/dL (ref 40–160)

## 2014-11-07 LAB — BASIC METABOLIC PANEL
BUN: 24 mg/dL — AB (ref 4–21)
CREATININE: 0.7 mg/dL (ref 0.5–1.1)
GLUCOSE: 86 mg/dL
POTASSIUM: 4.5 mmol/L (ref 3.4–5.3)
Sodium: 141 mmol/L (ref 137–147)

## 2014-11-07 LAB — CBC AND DIFFERENTIAL
HEMATOCRIT: 42 % (ref 36–46)
Hemoglobin: 13.6 g/dL (ref 12.0–16.0)
Neutrophils Absolute: 2 /uL
Platelets: 223 10*3/uL (ref 150–399)
WBC: 4.2 10*3/mL

## 2014-11-07 LAB — HEMOGLOBIN A1C: Hgb A1c MFr Bld: 5.6 % (ref 4.0–6.0)

## 2014-11-07 LAB — HEPATIC FUNCTION PANEL
ALT: 30 U/L (ref 7–35)
AST: 30 U/L (ref 13–35)
Alkaline Phosphatase: 77 U/L (ref 25–125)
Bilirubin, Total: 1 mg/dL

## 2015-01-06 ENCOUNTER — Telehealth: Payer: Self-pay

## 2015-01-06 NOTE — Telephone Encounter (Signed)
Patient called the Triage line to ask about a repeat Chest x-ray.  Returned call to patient, patient had surgery two weeks ago to repair a hernia and have several areas of scar tissue removed.  She said that she had some "normal" responses to the intubation" per the hospital staff, but is concerned due to her past CXR showing some what she called dots on it.  She wanted to know if we can get a repeat CXR done today if possible.  She is returning to work on Monday.  Per her records you had reviewed a prior CXR with her and had talked about repeating it when she had financial means set (Which is also convenient now due to hospitalization covering her out of pocket).  Please advise?

## 2015-01-07 NOTE — Telephone Encounter (Signed)
I do not see an indication for CXR. Needs to be evaluated prior to ordering any imaging.

## 2015-01-09 NOTE — Telephone Encounter (Signed)
Left message, notifying of need for appt and to call back to schedule an appt.

## 2015-02-15 ENCOUNTER — Ambulatory Visit
Admission: RE | Admit: 2015-02-15 | Discharge: 2015-02-15 | Disposition: A | Discharge status: Home / Self Care | Payer: 59 | Source: Ambulatory Visit | Attending: Obstetrics and Gynecology | Admitting: Obstetrics and Gynecology

## 2015-02-15 ENCOUNTER — Other Ambulatory Visit: Payer: Self-pay | Admitting: Obstetrics and Gynecology

## 2015-02-15 DIAGNOSIS — R079 Chest pain, unspecified: Secondary | ICD-10-CM

## 2015-03-08 ENCOUNTER — Encounter: Payer: Self-pay | Admitting: *Deleted

## 2015-04-11 ENCOUNTER — Telehealth: Payer: Self-pay | Admitting: *Deleted

## 2015-04-11 NOTE — Telephone Encounter (Signed)
Pt called very upset and was rude about how her PCP accidentally cancelled her apt with [place] to see Dr Caryl Comes And when PCP called back we had already filled it for we had an urgent add on.  Explained that our next apt with dr Caryl Comes would be aug 30 th not aug 2nd and she was upset and said that this is not her fault I apologized and said I am sorry and offered her the  location or another doctor and she was upset and kept saying that the other person Who took her time should be taken of for that was her time. She kept demanding we take that other person out from her "time" for she will not wait until the 30 th to see [place].  I then said sorry once again and then she asked to speak to a manager And I then said she is not in today but you can leave a voicemail and we will call her back  Pt then said "fine I hope I do not have a heart attack and I then said if you feel any symptoms please don't hesitate to go to the ED"  And she said "I'd rather die then go back there" Then I said sorry again and forwarded her to [employer].  Please advise.

## 2015-04-11 NOTE — Telephone Encounter (Signed)
Spoke to Freight forwarder and called patient we worked her in on aug 2nd at 8 am She was really grateful and thankful. She also apologized about being rude.  She will be here aug 2 at 8 am Thank you.

## 2015-04-18 ENCOUNTER — Ambulatory Visit: Payer: 59 | Admitting: Internal Medicine

## 2015-04-18 ENCOUNTER — Encounter: Payer: Self-pay | Admitting: Internal Medicine

## 2015-04-18 ENCOUNTER — Ambulatory Visit (INDEPENDENT_AMBULATORY_CARE_PROVIDER_SITE_OTHER): Payer: 59 | Admitting: Internal Medicine

## 2015-04-18 VITALS — BP 96/62 | HR 75 | Ht 67.0 in | Wt 148.0 lb

## 2015-04-18 DIAGNOSIS — I471 Supraventricular tachycardia: Secondary | ICD-10-CM | POA: Diagnosis not present

## 2015-04-18 DIAGNOSIS — R42 Dizziness and giddiness: Secondary | ICD-10-CM

## 2015-04-18 NOTE — Progress Notes (Signed)
ELECTROPHYSIOLOGY CONSULT NOTE  Patient ID: Michelle Armstrong, MRN: [id number], DOB/AGE: [birth year] 54 y.o. Admit date: (Not on file) Date of Consult: 04/18/2015  Primary Physician: Rica Mast, MD Primary Cardiologist: sk  Chief Complaint: recurrent palpitations   HPI Michelle Armstrong is a 54 y.o. female  Seen for recurrent palpitations.  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 She had no recurrent palpitations until about a year ago. She had 2 brief episodes over the last 12 months lasting a few minutes and attributed by her distress. More recently, she had an episode that lasted more than an hour. This was associated with lightheadedness body shaking shortness of breath. She does not recall if it was from positive. She was wearing a FitBit.; it documented a heart rate of 180. It terminated spontaneously.  The patient denies chest pain, shortness of breath, nocturnal dyspnea, orthopnea or peripheral edema.   She's had no syncope.  She does not have significant symptoms suggestive of autonomic dysfunction  She is status post obesity reduction surgery.  Outside labs reviewed;     Past Medical History  Diagnosis Date  . SVT (supraventricular tachycardia)     s/p RFCA  AVNRT  . Obesity   . HLD (hyperlipidemia)   . Vitamin D deficiency     low  . Hypothyroidism     possible h/o      Surgical History:  Past Surgical History  Procedure Laterality Date  . Abdominal hysterectomy  1999    endometriosis  . Gallbladder surgery    . Hernia repair  12/2010  . Bariatric surgery  12/2010     Home Meds: Prior to Admission medications   Medication Sig Start Date End Date Taking? Authorizing Provider  meclizine (ANTIVERT) 25 MG tablet Take 1 tablet (25 mg total) by mouth 3 (three) times daily as needed for dizziness. 06/15/14   Jackolyn Confer, MD  Multiple Vitamins-Minerals (MULTIVITAMIN WITH MINERALS) tablet Take 1 tablet by mouth daily.       Historical Provider, MD      Allergies:  Allergies  Allergen Reactions  . Cephalexin     Rash & dizziness  . Latex     rash  . Metoprolol     REACTION: dizziness, slurred speech    History   Social History  . Marital Status: Married    Spouse Name: N/A  . Number of Children: 1  . Years of Education: N/A   Occupational History  .  Lab [employer]   Social History Main Topics  . Smoking status: Never Smoker   . Smokeless tobacco: Never Used     Comment: tobacco use- no   . Alcohol Use: No  . Drug Use: No  . Sexual Activity: Not on file   Other Topics Concern  . Not on file   Social History Narrative     Family History  Problem Relation Age of Onset  . Heart disease Father   . Obesity Father   . Hyperlipidemia Father   . Thyroid disease Sister      ROS:  Please see the history of present illness.     All other systems reviewed and negative.    Physical Exam: Blood pressure 96/62, pulse 75, height 5\' 7"  (1.702 m), weight 148 lb (67.132 kg). General: Well developed, well nourished female in no acute distress. Head: Normocephalic, atraumatic, sclera non-icteric, no xanthomas, nares are without discharge. EENT: normal Lymph Nodes:  none Back: without scoliosis/kyphosis , no  CVA tendersness Neck: Negative for carotid bruits. JVD not elevated. Lungs: Clear bilaterally to auscultation without wheezes, rales, or rhonchi. Breathing is unlabored. Heart: RRR with S1 S2. No * murmur , rubs, or gallops appreciated. Abdomen: Soft, non-tender, non-distended with normoactive bowel sounds. No hepatomegaly. No rebound/guarding. No obvious abdominal masses. Msk:  Strength and tone appear normal for age. Extremities: No clubbing or cyanosis. No  edema.  Distal pedal pulses are 2+ and equal bilaterally. Skin: Warm and Dry Neuro: Alert and oriented X 3. CN III-XII intact Grossly normal sensory and motor function . Psych:  Responds to questions appropriately with a normal  affect.      Labs: Cardiac Enzymes No results for input(s): CKTOTAL, CKMB, TROPONINI in the last 72 hours. CBC Lab Results  Component Value Date   WBC 4.2 11/07/2014   HGB 13.6 11/07/2014   HCT 42 11/07/2014   PLT 223 11/07/2014   PROTIME: No results for input(s): LABPROT, INR in the last 72 hours. Chemistry No results for input(s): NA, K, CL, CO2, BUN, CREATININE, CALCIUM, PROT, BILITOT, ALKPHOS, ALT, AST, GLUCOSE in the last 168 hours.  Invalid input(s): LABALBU Lipids Lab Results  Component Value Date   CHOL 220* 11/07/2014   HDL 119* 11/07/2014   LDLCALC 91 11/07/2014   TRIG 51 11/07/2014   BNP No results found for: PROBNP Thyroid Function Tests: No results for input(s): TSH, T4TOTAL, T3FREE, THYROIDAB in the last 72 hours.  Invalid input(s): FREET3    Miscellaneous No results found for: DDIMER  Radiology/Studies:  No results found.  EKG: Sinus rhythm at 75 Intervals 17/09/40 Mild PR depression   Assessment and Plan:   SVT-status post ablation of slow pathway 2011  Recurrent palpitations  Obesity reduction surgery  The patient has recurrent palpitations following ablation. There is FitBit documentation of a heart rate of 180. I've encouraged her to get the AliveCor monitor so as to be able to document the tachycardia were fully. In the event that she has recurrence, she would like to undergo catheter ablation. She recalls the risks and benefits.  One of the caveats in this situation it is my concern that her obesity reduction surgery may hve been associated autonomic dysfunction. This is clinically normal but apparent; however, prior to invasive procedures as documented tachycardia that is not sinus.  Virl Axe

## 2015-04-18 NOTE — Patient Instructions (Signed)
Medication Instructions:  Your physician recommends that you continue on your current medications as directed. Please refer to the Current Medication list given to you today.   Labwork: none  Testing/Procedures: none  Follow-Up: Your physician recommends that you schedule a follow-up appointment in: three months with Dr. Caryl Comes.    Any Other Special Instructions Will Be Listed Below (If Applicable).

## 2015-04-19 ENCOUNTER — Telehealth: Payer: Self-pay | Admitting: Internal Medicine

## 2015-04-19 NOTE — Telephone Encounter (Signed)
S/w pt who states she would like to see if Dr. Caryl Comes would write prescription for AliveCor monitor that he mentioned to her at 8/2 OV. Is unsure if insurance would pay for it but would like to try and see. Would like to know if there is another device he would recommend for which insurance might pay.   Pt will continue to monitor HR. Encouraged clear liquids until she is feeling better. Forward to Aristes for advise regarding monitor.

## 2015-04-19 NOTE — Telephone Encounter (Signed)
S/w LaPortia at [facility] who states pt called inquiring about insurance coverage for AliveCor monitor. Informed LaPortia that I have left message for Dr. Caryl Comes regarding prescription. She states they will need prescription and procedure code to determine coverage for device. We will call back with information, [phone] ; reference [id number]

## 2015-04-19 NOTE — Telephone Encounter (Signed)
Dr. Caryl Comes asked patient to get an Alivecor monitor and she wants to know if this is something she needs an rx for so that insurance will help pay  ALSO patient c/o nausea/vomitting Burning in stomach up to throat thinks she may have eaten bad chicken and just doesn't feel well.  Patient says heart is not racing at this time.   Please call patient  To discuss.

## 2015-04-21 NOTE — Telephone Encounter (Signed)
alivecor monitor is not Rx and not insurance covered  It is bought by the pt as a phone case off amazon or something aobut 90 $

## 2015-04-24 NOTE — Telephone Encounter (Signed)
Pt left VM message returning my call. Requested for me to leave detailed VM on cell phone.  Left pt message regarding Alivecor monitor per Dr. Caryl Comes. Left CB number for any other questions.

## 2015-04-24 NOTE — Telephone Encounter (Signed)
Left message on machine for patient to contact the office.   

## 2015-05-02 ENCOUNTER — Ambulatory Visit: Payer: 59 | Admitting: Internal Medicine

## 2015-05-16 ENCOUNTER — Ambulatory Visit: Payer: 59 | Admitting: Internal Medicine

## 2015-06-08 ENCOUNTER — Encounter: Payer: Self-pay | Admitting: *Deleted

## 2015-06-12 ENCOUNTER — Ambulatory Visit: Payer: Self-pay | Admitting: General Surgery

## 2015-06-19 ENCOUNTER — Encounter: Payer: 59 | Admitting: Internal Medicine

## 2015-07-07 ENCOUNTER — Encounter: Payer: Self-pay | Admitting: *Deleted

## 2015-07-10 ENCOUNTER — Ambulatory Visit: Payer: 59 | Admitting: Certified Registered Nurse Anesthetist

## 2015-07-10 ENCOUNTER — Ambulatory Visit
Admission: RE | Admit: 2015-07-10 | Discharge: 2015-07-10 | Disposition: A | Discharge status: Home / Self Care | Payer: 59 | Source: Ambulatory Visit | Attending: Gastroenterology | Admitting: Gastroenterology

## 2015-07-10 ENCOUNTER — Encounter: Payer: Self-pay | Admitting: Anesthesiology

## 2015-07-10 ENCOUNTER — Encounter
Admission: RE | Disposition: A | Discharge status: Home / Self Care | Payer: Self-pay | Source: Ambulatory Visit | Attending: Gastroenterology

## 2015-07-10 DIAGNOSIS — D124 Benign neoplasm of descending colon: Secondary | ICD-10-CM | POA: Insufficient documentation

## 2015-07-10 DIAGNOSIS — E785 Hyperlipidemia, unspecified: Secondary | ICD-10-CM | POA: Diagnosis not present

## 2015-07-10 DIAGNOSIS — Z86010 Personal history of colonic polyps: Secondary | ICD-10-CM | POA: Diagnosis present

## 2015-07-10 DIAGNOSIS — Z91040 Latex allergy status: Secondary | ICD-10-CM | POA: Insufficient documentation

## 2015-07-10 DIAGNOSIS — K621 Rectal polyp: Secondary | ICD-10-CM | POA: Diagnosis not present

## 2015-07-10 DIAGNOSIS — E039 Hypothyroidism, unspecified: Secondary | ICD-10-CM | POA: Insufficient documentation

## 2015-07-10 DIAGNOSIS — Z881 Allergy status to other antibiotic agents status: Secondary | ICD-10-CM | POA: Diagnosis not present

## 2015-07-10 DIAGNOSIS — E559 Vitamin D deficiency, unspecified: Secondary | ICD-10-CM | POA: Insufficient documentation

## 2015-07-10 HISTORY — PX: COLONOSCOPY WITH PROPOFOL: SHX5780

## 2015-07-10 LAB — HM COLONOSCOPY: HM COLON: NORMAL

## 2015-07-10 SURGERY — COLONOSCOPY WITH PROPOFOL
Anesthesia: General

## 2015-07-10 MED ORDER — FENTANYL CITRATE (PF) 100 MCG/2ML IJ SOLN
INTRAMUSCULAR | Status: DC | PRN
  Administered 2015-07-10: 50 ug via INTRAVENOUS

## 2015-07-10 MED ORDER — EPHEDRINE SULFATE 50 MG/ML IJ SOLN
INTRAMUSCULAR | Status: DC | PRN
  Administered 2015-07-10 (×2): 5 mg via INTRAVENOUS

## 2015-07-10 MED ORDER — SODIUM CHLORIDE 0.9 % IV SOLN
INTRAVENOUS | Status: DC
  Administered 2015-07-10: 08:00:00 via INTRAVENOUS

## 2015-07-10 MED ORDER — PROPOFOL 10 MG/ML IV BOLUS
INTRAVENOUS | Status: DC | PRN
  Administered 2015-07-10: 20 mg via INTRAVENOUS
  Administered 2015-07-10: 50 mg via INTRAVENOUS

## 2015-07-10 MED ORDER — SODIUM CHLORIDE 0.9 % IV SOLN: INTRAVENOUS | Status: DC

## 2015-07-10 MED ORDER — PROPOFOL 500 MG/50ML IV EMUL
INTRAVENOUS | Status: DC | PRN
  Administered 2015-07-10: 100 ug/kg/min via INTRAVENOUS

## 2015-07-10 MED ORDER — MIDAZOLAM HCL 5 MG/5ML IJ SOLN
INTRAMUSCULAR | Status: DC | PRN
  Administered 2015-07-10: 1 mg via INTRAVENOUS

## 2015-07-10 MED ORDER — PHENYLEPHRINE HCL 10 MG/ML IJ SOLN
INTRAMUSCULAR | Status: DC | PRN
  Administered 2015-07-10 (×3): 100 ug via INTRAVENOUS

## 2015-07-10 NOTE — Anesthesia Preprocedure Evaluation (Signed)
Anesthesia Evaluation  Patient identified by MRN, date of birth, ID band Patient awake    Reviewed: Allergy & Precautions, H&P , NPO status , Patient's Chart, lab work & pertinent test results, reviewed documented beta blocker date and time   History of Anesthesia Complications Negative for: history of anesthetic complications  Airway Mallampati: I  TM Distance: >3 FB Neck ROM: full    Dental no notable dental hx. (+) Implants   Pulmonary neg pulmonary ROS,    Pulmonary exam normal breath sounds clear to auscultation       Cardiovascular Exercise Tolerance: Good Normal cardiovascular exam+ dysrhythmias (s/p ablation) Supra Ventricular Tachycardia  Rhythm:regular Rate:Normal     Neuro/Psych negative neurological ROS  negative psych ROS   GI/Hepatic negative GI ROS, Neg liver ROS,   Endo/Other  neg diabetesHypothyroidism (history)   Renal/GU negative Renal ROS  negative genitourinary   Musculoskeletal   Abdominal   Peds  Hematology negative hematology ROS (+)   Anesthesia Other Findings Past Medical History:   SVT (supraventricular tachycardia) (HCC)                       Comment:s/p RFCA  AVNRT   Obesity                                                      HLD (hyperlipidemia)                                         Vitamin D deficiency                                           Comment:low   Hypothyroidism                                                 Comment:possible h/o   Reproductive/Obstetrics negative OB ROS                             Anesthesia Physical Anesthesia Plan  ASA: II  Anesthesia Plan: General   Post-op Pain Management:    Induction:   Airway Management Planned:   Additional Equipment:   Intra-op Plan:   Post-operative Plan:   Informed Consent: I have reviewed the patients History and Physical, chart, labs and discussed the procedure including the  risks, benefits and alternatives for the proposed anesthesia with the patient or authorized representative who has indicated his/her understanding and acceptance.   Dental Advisory Given  Plan Discussed with: Anesthesiologist, CRNA and Surgeon  Anesthesia Plan Comments:         Anesthesia Quick Evaluation

## 2015-07-10 NOTE — Transfer of Care (Signed)
Immediate Anesthesia Transfer of Care Note  Patient: Michelle Armstrong  Procedure(s) Performed: Procedure(s): COLONOSCOPY WITH PROPOFOL (N/A)  Patient Location: PACU  Anesthesia Type:General  Level of Consciousness: awake, alert , oriented and patient cooperative  Airway & Oxygen Therapy: Patient Spontanous Breathing and Patient connected to nasal cannula oxygen  Post-op Assessment: Report given to RN and Post -op Vital signs reviewed and stable  Post vital signs: Reviewed and stable  Last Vitals:  Filed Vitals:   07/10/15 0857  BP: 108/67  Pulse: 75  Temp: 36.4 C  Resp: 15    Complications: No apparent anesthesia complications

## 2015-07-10 NOTE — H&P (Signed)
Outpatient short stay form Pre-procedure 07/10/2015 7:54 AM Lollie Sails MD  Primary Physician: Dr. Ronette Deter  Reason for visit:  Colonoscopy  History of present illness:  Patient is a 54 year old female with a personal history of adenomatous colon polyps. Last colonoscopy was in 2012 normal at that time. She tolerated her prep well although did require some magnesium citrate as well as a regular prep. He is doing well this morning. She denies any abdominal pain or discomfort. Takes no aspirin or thinning products.      Current facility-administered medications:  .  0.9 %  sodium chloride infusion, , Intravenous, Continuous, Lollie Sails, MD  Prescriptions prior to admission  Medication Sig Dispense Refill Last Dose  . Multiple Vitamins-Minerals (MULTIVITAMIN WITH MINERALS) tablet Take 1 tablet by mouth daily.     Taking     Allergies  Allergen Reactions  . Cephalexin     Rash & dizziness  . Latex     rash  . Metoprolol     REACTION: dizziness, slurred speech     Past Medical History  Diagnosis Date  . SVT (supraventricular tachycardia) (HCC)     s/p RFCA  AVNRT  . Obesity   . HLD (hyperlipidemia)   . Vitamin D deficiency     low  . Hypothyroidism     possible h/o    Review of systems:      Physical Exam    Heart and lungs: Regular rate and rhythm without rub or gallop, lungs are bilaterally clear    HEENT: Norm cephalic atraumatic eyes are anicteric    Other:     Pertinant exam for procedure: Soft nontender nondistended bowel sounds positive normoactive    Planned proceedures: Colonoscopy and indicated procedures. I have discussed the risks benefits and complications of procedures to include not limited to bleeding, infection, perforation and the risk of sedation and the patient wishes to proceed.    Lollie Sails, MD Gastroenterology 07/10/2015  7:54 AM

## 2015-07-10 NOTE — Anesthesia Postprocedure Evaluation (Signed)
  Anesthesia Post-op Note  Patient: Michelle Armstrong  Procedure(s) Performed: Procedure(s): COLONOSCOPY WITH PROPOFOL (N/A)  Anesthesia type:General  Patient location: PACU  Post pain: Pain level controlled  Post assessment: Post-op Vital signs reviewed, Patient's Cardiovascular Status Stable, Respiratory Function Stable, Patent Airway and No signs of Nausea or vomiting  Post vital signs: Reviewed and stable  Last Vitals:  Filed Vitals:   07/10/15 0920  BP: 101/62  Pulse: 82  Temp:   Resp: 15    Level of consciousness: awake, alert  and patient cooperative  Complications: No apparent anesthesia complications

## 2015-07-10 NOTE — Op Note (Signed)
[facility] Gastroenterology Patient Name: Michelle Armstrong Procedure Date: 07/10/2015 8:22 AM MRN: [id number] Account #: [id number] Date of Birth: Jun 26, 1961 Admit Type: Outpatient Age: 54 Room: [facility] ENDO ROOM 3 Gender: Female Note Status: Finalized Procedure:         Colonoscopy Indications:       Personal history of colonic polyps Providers:         Lollie Sails, MD Referring MD:      Eduard Clos. Gilford Rile, MD (Referring MD) Medicines:         Monitored Anesthesia Care Complications:     No immediate complications. Procedure:         Pre-Anesthesia Assessment:                    - ASA Grade Assessment: II - A patient with mild systemic                     disease.                    After obtaining informed consent, the colonoscope was                     passed under direct vision. Throughout the procedure, the                     patient's blood pressure, pulse, and oxygen saturations                     were monitored continuously. The Colonoscope was                     introduced through the anus and advanced to the the cecum,                     identified by appendiceal orifice and ileocecal valve. The                     colonoscopy was performed without difficulty. The patient                     tolerated the procedure well. The quality of the bowel                     preparation was fair. Findings:      A 2 mm polyp was found in the descending colon. The polyp was sessile.       The polyp was removed with a cold biopsy forceps. Resection and       retrieval were complete.      Two sessile polyps were found in the rectum. The polyps were less than 1       mm in size. These polyps were removed with a cold biopsy forceps.       Resection and retrieval were complete.      The digital rectal exam was normal.      There was a small lipoma, in the proximal transverse colon. Positive       sign. Impression:        - One 2 mm polyp in the  descending colon. Resected and                     retrieved.                    -  Two less than 1 mm polyps in the rectum. Resected and                     retrieved. Recommendation:    - Await pathology results.                    - Telephone GI clinic for pathology results in 1 week. Procedure Code(s): --- Professional ---                    [phone], Colonoscopy, flexible; with biopsy, single or                     multiple Diagnosis Code(s): --- Professional ---                    211.3, Benign neoplasm of colon                    569.0, Anal and rectal polyp                    V12.72, Personal history of colonic polyps CPT copyright 2014 American Medical Association. All rights reserved. The codes documented in this report are preliminary and upon coder review may  be revised to meet current compliance requirements. Lollie Sails, MD 07/10/2015 8:55:21 AM This report has been signed electronically. Number of Addenda: 0 Note Initiated On: 07/10/2015 8:22 AM Scope Withdrawal Time: 0 hours 12 minutes 58 seconds  Total Procedure Duration: 0 hours 24 minutes 44 seconds       [facility]

## 2015-07-11 LAB — SURGICAL PATHOLOGY

## 2015-07-13 ENCOUNTER — Encounter: Payer: Self-pay | Admitting: Gastroenterology

## 2015-07-17 ENCOUNTER — Ambulatory Visit (INDEPENDENT_AMBULATORY_CARE_PROVIDER_SITE_OTHER): Payer: 59 | Admitting: General Surgery

## 2015-07-17 ENCOUNTER — Encounter: Payer: Self-pay | Admitting: General Surgery

## 2015-07-17 VITALS — BP 110/68 | HR 70 | Resp 12 | Ht 68.0 in | Wt 148.0 lb

## 2015-07-17 DIAGNOSIS — D1739 Benign lipomatous neoplasm of skin and subcutaneous tissue of other sites: Secondary | ICD-10-CM

## 2015-07-17 DIAGNOSIS — D1720 Benign lipomatous neoplasm of skin and subcutaneous tissue of unspecified limb: Secondary | ICD-10-CM

## 2015-07-17 DIAGNOSIS — L821 Other seborrheic keratosis: Secondary | ICD-10-CM | POA: Diagnosis not present

## 2015-07-17 NOTE — Patient Instructions (Signed)

## 2015-07-17 NOTE — Progress Notes (Signed)
Patient ID: Michelle Armstrong, female   DOB: 1961-05-27, 54 y.o.   MRN: [id number]  Chief Complaint  Patient presents with  . Other    mass left thigh    HPI [name] is a 54 y.o. female here for an evaluation of a mass on her left upper thigh. Michelle Armstrong first noticed this about 2 years ago. Michelle Armstrong states that a few months ago it started to be achy when wearing stockings or when Michelle Armstrong has pressure against the area. Michelle Armstrong states that it can cause discomfort with walking also. Michelle Armstrong states Michelle Armstrong also has a mole or skin tag on her back that is now itching and burning. Michelle Armstrong states this first started about 4-5 months ago.            HPI  Past Medical History  Diagnosis Date  . SVT (supraventricular tachycardia) (HCC)     s/p RFCA  AVNRT  . Obesity   . HLD (hyperlipidemia)   . Vitamin D deficiency     low  . Hypothyroidism     possible h/o    Past Surgical History  Procedure Laterality Date  . Abdominal hysterectomy  1999    endometriosis  . Gallbladder surgery    . Hernia repair  12/2010  . Bariatric surgery  12/2010  . Colonoscopy with propofol N/A 07/10/2015    Procedure: COLONOSCOPY WITH PROPOFOL;  Surgeon: Lollie Sails, MD;  Location: [facility] ENDOSCOPY;  Service: Endoscopy;  Laterality: N/A;    Family History  Problem Relation Age of Onset  . Heart disease Father   . Obesity Father   . Hyperlipidemia Father   . Thyroid disease Sister     Social History Social History  Substance Use Topics  . Smoking status: Never Smoker   . Smokeless tobacco: Never Used     Comment: tobacco use- no   . Alcohol Use: 0.0 oz/week    0 Standard drinks or equivalent per week    Allergies  Allergen Reactions  . Cephalexin     Rash & dizziness  . Latex     rash  . Metoprolol     REACTION: dizziness, slurred speech    Current Outpatient Prescriptions  Medication Sig Dispense Refill  . CALCIUM PO Take 2 tablets by mouth daily.    . Cyanocobalamin (VITAMIN B-12 PO) Take 1 tablet by  mouth every other day.    . Multiple Vitamins-Minerals (MULTIVITAMIN WITH MINERALS) tablet Take 1 tablet by mouth daily.       No current facility-administered medications for this visit.    Review of Systems Review of Systems  Constitutional: Negative.   Respiratory: Negative.   Cardiovascular: Negative.     Blood pressure 110/68, pulse 70, resp. rate 12, height 5\' 8"  (1.727 m), weight 148 lb (67.132 kg).  Physical Exam Physical Exam  Constitutional: Michelle Armstrong is oriented to person, place, and time. Michelle Armstrong appears well-developed and well-nourished.  Eyes: Conjunctivae are normal. No scleral icterus.  Neck: Neck supple.  Cardiovascular: Normal rate, regular rhythm and normal heart sounds.   Pulmonary/Chest: Effort normal and breath sounds normal.    Musculoskeletal:       Legs: Lymphadenopathy:    Michelle Armstrong has no cervical adenopathy.       Left: No inguinal adenopathy present.  Neurological: Michelle Armstrong is alert and oriented to person, place, and time.  Skin: Skin is warm and dry.  1.5 cm tender subcutaneous nodule on the left upper lateral thigh. 5x9 mm keratosis on back  Psychiatric: Michelle Armstrong has a normal mood and affect.    Data Reviewed Office notes from [place] dated 65/79/0383.  Assessment    Symptomatic soft tissue mass left anterior lateral thigh, likely lipoma.  Seborrheic keratosis of the back.    Plan    The patient reports discomfort in the left proximal thigh even with wearing pantyhose. Options for management are 1) observation versus 2) excision.  The seborrheic keratosis could either be excised or treated with liquid nitrogen by the dermatology service.  The patient will notify the office of how Michelle Armstrong would like to proceed.    PCP: Ronette Deter Ref. Fraser Din  Hervey Ard W 07/17/2015, 8:17 PM

## 2015-08-03 ENCOUNTER — Telehealth: Payer: Self-pay | Admitting: Internal Medicine

## 2015-08-03 NOTE — Telephone Encounter (Signed)
Left message on machine for patient to contact the office.   

## 2015-08-03 NOTE — Telephone Encounter (Signed)
Attempted to schedule from recall list.  Patient does not want to schedule fu at this time.    Patient needed an alivcor monitor before appt and wanted Caryl Comes to write an RX so that insurance would cover.  This never happened so patient does not have any data fo fu appt except hr recorded on fitbit.   Patient wants to find out if she should come in for appt given the above situation please call back    Pt c/o of Chest Pain: STAT if CP now or developed within 24 hours  1. Are you having CP right now? Yes patient walking and while talking and states ooh my chest is hurting/sore  2. Are you experiencing any other symptoms? Sounds sob while walking  3. How long have you been experiencing CP? This happens this is why she had an ablation (per patient)  4. Is your CP continuous or coming and going? Happens all the time  Patient sounded upset when i mentioned that I would have a nurse call her about cp and stated she never c/o cp and didn't mean for me to take notes and didn't need a phone call from nurse about it  5. Have you taken Nitroglycerin? Patient states she has pills but they are old ?

## 2015-08-07 NOTE — Telephone Encounter (Signed)
S/w pt who states she has been unable to purchase Alivecor as recommended by Dr. Caryl Comes at [place]. She has been out of work and unable to afford without prescription. Reports pounding heart beats on 2 different occasions since her last OV. Unable to document HR at that time. When asked about CP phone call she states she was walking quickly, causing chest discomfort. She clearly states she was not having chest pain. Would still like to know about prescription for Alivecor Informed pt I would ask Dr. Caryl Comes about this and call her back. Pt appreciative of call

## 2015-08-09 NOTE — Telephone Encounter (Signed)
Per verbal from Dr. Caryl Comes, he can not write prescription for Alivecor. Left detailed message w/CB number on pt cell VM

## 2015-08-14 ENCOUNTER — Ambulatory Visit (INDEPENDENT_AMBULATORY_CARE_PROVIDER_SITE_OTHER): Payer: 59 | Admitting: General Surgery

## 2015-08-14 ENCOUNTER — Encounter: Payer: Self-pay | Admitting: General Surgery

## 2015-08-14 VITALS — BP 120/74 | HR 92 | Resp 14 | Ht 68.0 in | Wt 150.0 lb

## 2015-08-14 DIAGNOSIS — R2241 Localized swelling, mass and lump, right lower limb: Secondary | ICD-10-CM

## 2015-08-14 DIAGNOSIS — D1720 Benign lipomatous neoplasm of skin and subcutaneous tissue of unspecified limb: Secondary | ICD-10-CM

## 2015-08-14 DIAGNOSIS — R2240 Localized swelling, mass and lump, unspecified lower limb: Secondary | ICD-10-CM | POA: Insufficient documentation

## 2015-08-14 NOTE — Progress Notes (Signed)
Patient ID: Michelle Armstrong, female   DOB: 09/16/1961, 54 y.o.   MRN: [id number]  Chief Complaint  Patient presents with  . Procedure    excision skin nodule left thigh    HPI Michelle Armstrong is a 54 y.o. female here today for excision of a suspected lipoma of the left thigh. The area has been symptomatic to touch. The seborrheic keratosis on the back was treated with the dermatology service with liquid nitrogen with excellent results. HPI  Past Medical History  Diagnosis Date  . SVT (supraventricular tachycardia) (HCC)     s/p RFCA  AVNRT  . Obesity   . HLD (hyperlipidemia)   . Vitamin D deficiency     low  . Hypothyroidism     possible h/o    Past Surgical History  Procedure Laterality Date  . Abdominal hysterectomy  1999    endometriosis  . Gallbladder surgery    . Hernia repair  12/2010  . Bariatric surgery  12/2010  . Colonoscopy with propofol N/A 07/10/2015    Procedure: COLONOSCOPY WITH PROPOFOL;  Surgeon: Michelle Sails, MD;  Location: [facility] ENDOSCOPY;  Service: Endoscopy;  Laterality: N/A;    Family History  Problem Relation Age of Onset  . Heart disease Father   . Obesity Father   . Hyperlipidemia Father   . Thyroid disease Sister     Social History Social History  Substance Use Topics  . Smoking status: Never Smoker   . Smokeless tobacco: Never Used     Comment: tobacco use- no   . Alcohol Use: 0.0 oz/week    0 Standard drinks or equivalent per week    Allergies  Allergen Reactions  . Cephalexin     Rash & dizziness  . Latex     rash  . Metoprolol     REACTION: dizziness, slurred speech    Current Outpatient Prescriptions  Medication Sig Dispense Refill  . CALCIUM PO Take 2 tablets by mouth daily.    . Cyanocobalamin (VITAMIN B-12 PO) Take 1 tablet by mouth every other day.    . Multiple Vitamins-Minerals (MULTIVITAMIN WITH MINERALS) tablet Take 1 tablet by mouth daily.       No current facility-administered medications for  this visit.    Review of Systems Review of Systems  Blood pressure 120/74, pulse 92, resp. rate 14, height 5\' 8"  (1.727 m), weight 150 lb (68.04 kg).  Physical Exam Physical Exam  Musculoskeletal:       Legs:     Assessment    Symptomatic left anterior lateral thigh lipoma.    Plan    The area was marked and plans for procedure reviewed. 10 mL of 0.5% Xylocaine with 0.25% Marcaine with 1-200,000 of epinephrine was utilized well tolerated. ChloraPrep was applied to the skin.  A transverse incision was made over the mass. A nearly 2 cm lipoma was removed from the soft tissue. No bleeding. Deep tissue approximated with 4-0 Vicryl simple suture. Skin closed with a running 4-0 Vicryl subcuticular suture. Benzoin, Steri-Strips, Telfa and Tegaderm dressing applied.  Ice pack provided.  The patient is traveling to [place], [place] tomorrow to be with her son for up coming hip surgery. She was encouraged to leave the waterproof dressing on for 48 hours. Steri-Strips until they're loose. Tylenol/Advil/Aleve if needed for soreness. She'll contact the office if she develops any concerns about wound healing. She'll be contacted when pathology is available.    Patient to return as needed.  PCP:  Michelle Armstrong 08/14/2015, 3:04 PM

## 2015-08-14 NOTE — Patient Instructions (Signed)
Patient to return as needed. 

## 2015-08-16 ENCOUNTER — Ambulatory Visit: Payer: 59 | Admitting: General Surgery

## 2015-08-24 ENCOUNTER — Ambulatory Visit: Payer: 59 | Admitting: General Surgery

## 2015-08-24 ENCOUNTER — Telehealth: Payer: Self-pay | Admitting: *Deleted

## 2015-08-24 NOTE — Telephone Encounter (Signed)
Patient was notified of her results, she was very pleased.

## 2015-08-24 NOTE — Telephone Encounter (Signed)
Patient called and was wanting to know her results from 08/14/15

## 2015-08-24 NOTE — Telephone Encounter (Signed)
Please notify the patient that the pathology showed a lipoma, as expected. Benign fatty lesion. No additional treatment required.

## 2015-09-04 ENCOUNTER — Telehealth: Payer: Self-pay | Admitting: Internal Medicine

## 2015-09-04 ENCOUNTER — Encounter: Payer: Self-pay | Admitting: Internal Medicine

## 2015-09-04 NOTE — Telephone Encounter (Signed)
Patient was not interested in scheduling fu in November.   Nursing lmov for patient .    Multiple attempts made to schedule appt from recall list.   Sending patient letter and deleting recall  As patient was not interested in scheduling.

## 2015-10-17 ENCOUNTER — Encounter: Payer: Self-pay | Admitting: *Deleted

## 2015-12-27 ENCOUNTER — Encounter: Payer: Self-pay | Admitting: Family Medicine

## 2015-12-27 ENCOUNTER — Ambulatory Visit (INDEPENDENT_AMBULATORY_CARE_PROVIDER_SITE_OTHER): Payer: 59 | Admitting: Family Medicine

## 2015-12-27 ENCOUNTER — Ambulatory Visit
Admission: RE | Admit: 2015-12-27 | Discharge: 2015-12-27 | Disposition: A | Discharge status: Home / Self Care | Payer: 59 | Source: Ambulatory Visit | Attending: Family Medicine | Admitting: Family Medicine

## 2015-12-27 VITALS — BP 136/90 | HR 80 | Temp 98.3°F | Ht 64.0 in

## 2015-12-27 DIAGNOSIS — R52 Pain, unspecified: Secondary | ICD-10-CM | POA: Insufficient documentation

## 2015-12-27 DIAGNOSIS — R0789 Other chest pain: Secondary | ICD-10-CM | POA: Diagnosis not present

## 2015-12-27 DIAGNOSIS — R0602 Shortness of breath: Secondary | ICD-10-CM | POA: Diagnosis present

## 2015-12-27 DIAGNOSIS — Z8639 Personal history of other endocrine, nutritional and metabolic disease: Secondary | ICD-10-CM

## 2015-12-27 MED ORDER — CYCLOBENZAPRINE HCL 5 MG PO TABS: 5.0000 mg | ORAL_TABLET | Freq: Three times a day (TID) | ORAL | Status: DC | PRN

## 2015-12-27 NOTE — Patient Instructions (Signed)
Continue the tylenol and use the flexeril as needed.  We will call with the results of your testing.  This is going to slowly improve.  Take care  Dr. Lacinda Axon

## 2015-12-27 NOTE — Assessment & Plan Note (Signed)
Patient multiple complaints today. Back pain, leg pain, chest tightness, arm pain. Suspect somatization. Treating with Flexeril and Tylenol.

## 2015-12-27 NOTE — Progress Notes (Signed)
Pre visit review using our clinic review tool, if applicable. No additional management support is needed unless otherwise documented below in the visit note. 

## 2015-12-27 NOTE — Progress Notes (Signed)
Subjective:  Patient ID: Michelle Armstrong, female    DOB: 28-May-1961  Age: 55 y.o. MRN: [id number]  CC: Recent fall, concern for clot  HPI:  55 year old female presents to the clinic today with complaints of pain following a recent fall. She is also concerned that she may have a blood clot.  Patient suffered a fall at work approximately 4 weeks ago. She states that she tripped on the carpet and subsequently fail for an hit her head and neck, knees, back on the wall/floor. She has seen the walk-in clinic following her injury. X-rays were negative. She subtotally was sent to an orthopedist. She states she was not happy with the care. She states that recently she's been experiencing severe pain. She is experiencing back pain, left leg pain, and is also having tightness/burning in the chest. She states that her right hand/wrist pain as well as neck pain and headaches have improved. She is concerned as she has a family history of blood clots. She feels that a blood clot may be contributing to her pain/symptoms.  Patient reports associated shortness of breath with the tightness in her chest. She describes her chest discomfort as a burning/sharp pain. No known relieving factors. Her back pain is located in the low back. He denies any radicular symptoms. Additionally, she reports that she has a focal area of left leg pain in the left calf. Patient is currently taking Tylenol for the pain. She states that she is unable to take anti-inflammatories secondary to history of gastric bypass. She is also been unable to tolerate opiate medications.  Social Hx   Social History   Social History  . Marital Status: Married    Spouse Name: N/A  . Number of Children: 1  . Years of Education: N/A   Occupational History  .  Lab [employer]   Social History Main Topics  . Smoking status: Never Smoker   . Smokeless tobacco: Never Used     Comment: tobacco use- no   . Alcohol Use: 0.0 oz/week    0 Standard drinks  or equivalent per week  . Drug Use: No  . Sexual Activity: Not Asked   Other Topics Concern  . None   Social History Narrative   Review of Systems  Respiratory: Positive for chest tightness and shortness of breath.   Musculoskeletal: Positive for back pain and arthralgias.   Objective:  BP 136/90 mmHg  Pulse 80  Temp(Src) 98.3 F (36.8 C) (Oral)  Ht 5\' 4"  (1.626 m)  SpO2 98%  BP/Weight 12/27/2015 08/14/2015 123456  Systolic BP [id number] 123456 [id number]  Diastolic BP 90 74 68  Wt. (Lbs) - 150 148  BMI - 22.81 22.51   Physical Exam  Constitutional: She appears well-developed.  Anxious appearing.  Cardiovascular: Normal rate and regular rhythm.   No murmur heard. Pulmonary/Chest: Effort normal and breath sounds normal.  Musculoskeletal:  Low back - patient muscles exquisitely tender to palpation. Out of proportion to exam. Left calf - no erythema or swelling indicative of DVT. Negative Homans sign. Patient does have a discrete area of tenderness in the mid calf. Once again this appears out of proportion to exam.  Psychiatric:  Anxious, perseverative.  Vitals reviewed.  Lab Results  Component Value Date   WBC 4.2 11/07/2014   HGB 13.6 11/07/2014   HCT 42 11/07/2014   PLT 223 11/07/2014   CHOL 220* 11/07/2014   TRIG 51 11/07/2014   HDL 119* 11/07/2014   LDLCALC 91 11/07/2014  ALT 30 11/07/2014   AST 30 11/07/2014   NA 141 11/07/2014   K 4.5 11/07/2014   CREATININE 0.7 11/07/2014   BUN 24* 11/07/2014   HGBA1C 5.6 11/07/2014    Assessment & Plan:   Problem List Items Addressed This Visit    Chest tightness - Primary    New problem. Patient relates this to the fall. She is concerned about clot/pulmonary embolus. Obtaining d-dimer and lower extremity dopplers today.      Diffuse pain    Patient multiple complaints today. Back pain, leg pain, chest tightness, arm pain. Suspect somatization. Treating with Flexeril and Tylenol.        Other Visit Diagnoses    SOB  (shortness of breath)        Relevant Orders    US Venous Img Lower Bilateral    D-Dimer, Quantitative    History of hypothyroidism        Relevant Orders    TSH    T3, free    T4, free       Meds ordered this encounter  Medications  . cyclobenzaprine (FLEXERIL) 5 MG tablet    Sig: Take 1 tablet (5 mg total) by mouth 3 (three) times daily as needed for muscle spasms.    Dispense:  90 tablet    Refill:  1    Follow-up: PRN  [place]

## 2015-12-27 NOTE — Assessment & Plan Note (Signed)
New problem. Patient relates this to the fall. She is concerned about clot/pulmonary embolus. Obtaining d-dimer and lower extremity dopplers today.

## 2015-12-28 ENCOUNTER — Telehealth: Payer: Self-pay | Admitting: *Deleted

## 2015-12-28 ENCOUNTER — Telehealth: Payer: Self-pay | Admitting: Internal Medicine

## 2015-12-28 ENCOUNTER — Ambulatory Visit: Payer: 59 | Admitting: Family Medicine

## 2015-12-28 LAB — SPECIMEN STATUS

## 2015-12-28 LAB — T3, FREE: T3 FREE: 2.7 pg/mL (ref 2.0–4.4)

## 2015-12-28 LAB — TSH: TSH: 2.07 u[IU]/mL (ref 0.450–4.500)

## 2015-12-28 LAB — D-DIMER, QUANTITATIVE (NOT AT ARMC): D-DIMER: 0.32 mg{FEU}/L (ref 0.00–0.49)

## 2015-12-28 LAB — T4, FREE: FREE T4: 1.22 ng/dL (ref 0.82–1.77)

## 2015-12-28 NOTE — Telephone Encounter (Signed)
Patient requesting a copy of her recent lab results done on 4.12.17.

## 2015-12-28 NOTE — Telephone Encounter (Signed)
Informed patient that we did not have those results yet but will send them as soon as we do.

## 2015-12-28 NOTE — Telephone Encounter (Signed)
Pt called and spoke with Abbie, pt wanted to let you know that she seen the Physical therapist, when they did an examination they could feel her back spasms.

## 2016-09-16 DIAGNOSIS — M810 Age-related osteoporosis without current pathological fracture: Secondary | ICD-10-CM

## 2016-09-16 HISTORY — DX: Age-related osteoporosis without current pathological fracture: M81.0

## 2016-09-27 ENCOUNTER — Telehealth: Payer: Self-pay | Admitting: Family Medicine

## 2016-09-27 NOTE — Telephone Encounter (Signed)
Pt switched to Dr Lacinda Axon but not needing a appt at this time. Thank you!

## 2016-10-28 ENCOUNTER — Telehealth: Payer: Self-pay | Admitting: Family Medicine

## 2016-10-28 NOTE — Telephone Encounter (Signed)
Pt called about not feeling well with sinus pressure hurts to talk, green mucus,dizziness,chills,throat hurts and body aches and ears hurt. Pt was transferred to nurse line due to dizziness. Pt is scheduled to come in tomorrow. Thank you!

## 2016-10-28 NOTE — Telephone Encounter (Signed)
FYI

## 2016-10-29 ENCOUNTER — Encounter: Payer: Self-pay | Admitting: Family Medicine

## 2016-10-29 ENCOUNTER — Ambulatory Visit (INDEPENDENT_AMBULATORY_CARE_PROVIDER_SITE_OTHER): Payer: 59 | Admitting: Family Medicine

## 2016-10-29 VITALS — BP 105/75 | HR 86 | Temp 99.1°F

## 2016-10-29 DIAGNOSIS — R69 Illness, unspecified: Secondary | ICD-10-CM | POA: Diagnosis not present

## 2016-10-29 DIAGNOSIS — J111 Influenza due to unidentified influenza virus with other respiratory manifestations: Secondary | ICD-10-CM

## 2016-10-29 LAB — POCT INFLUENZA A/B
INFLUENZA B, POC: NEGATIVE
Influenza A, POC: NEGATIVE

## 2016-10-29 LAB — POCT RAPID STREP A (OFFICE): RAPID STREP A SCREEN: NEGATIVE

## 2016-10-29 MED ORDER — ONDANSETRON HCL 4 MG PO TABS: 4.0000 mg | ORAL_TABLET | Freq: Three times a day (TID) | ORAL | 0 refills | Status: DC | PRN

## 2016-10-29 MED ORDER — AMOXICILLIN-POT CLAVULANATE 875-125 MG PO TABS: 1.0000 | ORAL_TABLET | Freq: Two times a day (BID) | ORAL | 0 refills | Status: DC

## 2016-10-29 MED ORDER — OSELTAMIVIR PHOSPHATE 75 MG PO CAPS: 75.0000 mg | ORAL_CAPSULE | Freq: Two times a day (BID) | ORAL | 0 refills | Status: DC

## 2016-10-29 NOTE — Assessment & Plan Note (Signed)
New problem. Rapid flu negative. Strep negative. Concern for flu given symptoms (despite negative rapid flu). Covering with tamiflu. Also covering for sinusitis with Augmentin (given severity of symptoms).

## 2016-10-29 NOTE — Progress Notes (Signed)
Subjective:  Patient ID: Michelle Armstrong, female    DOB: [birth year]  Age: 56 y.o. MRN: [id number]  CC: Flu like symptoms  HPI:  56 year old female presents with flulike symptoms.  Patient states that she's been sick since Sunday. She said sore throat, ear pain, sinus pressure, congestion, fever, body aches, productive cough. She been using Tylenol and some homeopathic eardrops and some eyedrops without significant improvement. Her husband is also sick. She states that her symptoms are severe. No known exacerbating factors. No other associated symptoms. No other complaints or concerns this time.   Social Hx   Social History   Social History  . Marital status: Married    Spouse name: N/A  . Number of children: 1  . Years of education: N/A   Occupational History  .  Lab [employer]   Social History Main Topics  . Smoking status: Never Smoker  . Smokeless tobacco: Never Used     Comment: tobacco use- no   . Alcohol use 0.0 oz/week  . Drug use: No  . Sexual activity: Not Asked   Other Topics Concern  . None   Social History Narrative  . None   Review of Systems  Constitutional: Positive for fever.  HENT: Positive for congestion, ear pain and sore throat.   Respiratory: Positive for cough.   Gastrointestinal: Positive for nausea.  Musculoskeletal:       Body aches.    Objective:  BP 105/75 (BP Location: Left Arm, Patient Position: Sitting, Cuff Size: Normal)   Pulse 86   Temp 99.1 F (37.3 C) (Oral)   SpO2 95%   BP/Weight 10/29/2016 12/27/2015 [id number]  Systolic BP 123456 [id number] 123456  Diastolic BP 75 90 74  Wt. (Lbs) - - 150  BMI - - 22.81   Physical Exam  Constitutional: She is oriented to person, place, and time. She appears well-developed.  Appears fatigued.  HENT:  Oropharynx with mild erythema. Normal TM's.  Eyes: Conjunctivae are normal.  Neck:  Anterior cervical tenderness.  Cardiovascular: Normal rate and regular rhythm.   Pulmonary/Chest: Effort  normal.  Neurological: She is alert and oriented to person, place, and time.  Vitals reviewed.  Lab Results  Component Value Date   WBC WILL FOLLOW 12/27/2015   HGB 13.6 11/07/2014   HCT WILL FOLLOW 12/27/2015   PLT WILL FOLLOW 12/27/2015   CHOL 220 (A) 11/07/2014   TRIG 51 11/07/2014   HDL 119 (A) 11/07/2014   LDLCALC 91 11/07/2014   ALT 30 11/07/2014   AST 30 11/07/2014   NA 141 11/07/2014   K 4.5 11/07/2014   CREATININE 0.7 11/07/2014   BUN 24 (A) 11/07/2014   TSH 2.070 12/27/2015   HGBA1C 5.6 11/07/2014    Assessment & Plan:   Problem List Items Addressed This Visit    Influenza-like illness - Primary    New problem. Rapid flu negative. Strep negative. Concern for flu given symptoms (despite negative rapid flu). Covering with tamiflu. Also covering for sinusitis with Augmentin (given severity of symptoms).      Relevant Orders   POCT Influenza A/B (Completed)   POCT rapid strep A (Completed)      Meds ordered this encounter  Medications  . amoxicillin-clavulanate (AUGMENTIN) 875-125 MG tablet    Sig: Take 1 tablet by mouth 2 (two) times daily.    Dispense:  20 tablet    Refill:  0  . oseltamivir (TAMIFLU) 75 MG capsule    Sig: Take 1 capsule (75 mg  total) by mouth 2 (two) times daily.    Dispense:  10 capsule    Refill:  0  . ondansetron (ZOFRAN) 4 MG tablet    Sig: Take 1 tablet (4 mg total) by mouth every 8 (eight) hours as needed for nausea or vomiting.    Dispense:  20 tablet    Refill:  0    Follow-up: PRN  [place]

## 2016-10-29 NOTE — Patient Instructions (Signed)
I suspect this is influenza.  Take the medications as prescribed.  Take care  Dr. Lacinda Axon

## 2016-10-30 ENCOUNTER — Other Ambulatory Visit: Payer: Self-pay | Admitting: Family Medicine

## 2016-10-30 ENCOUNTER — Telehealth: Payer: Self-pay | Admitting: Family Medicine

## 2016-10-30 MED ORDER — DOXYCYCLINE HYCLATE 100 MG PO TABS: 100.0000 mg | ORAL_TABLET | Freq: Two times a day (BID) | ORAL | 0 refills | Status: DC

## 2016-10-30 NOTE — Telephone Encounter (Signed)
Rx sent for Doxy.

## 2016-10-30 NOTE — Telephone Encounter (Signed)
Pt called and informed of new RX at pharmacy.

## 2016-10-30 NOTE — Telephone Encounter (Signed)
Pt called and stated that she had a reaction to the antibiotic that Dr. Lacinda Axon prescribed yesterday. Her symptoms included extreme stomach pain, gas, headache, mouth scratchy, finger tips numb, tongue swollen. She wants to know if Dr. Lacinda Axon could something else in. Is there a liquid antibiotic that she can take. She also asked that she is not prescribed any NSAIDS. She would also like to be called when completed. Please advise, thank you!  Michelle Armstrong, Michelle Armstrong  Call pt @ [phone]

## 2016-10-30 NOTE — Telephone Encounter (Signed)
Recommendations?

## 2016-11-05 ENCOUNTER — Other Ambulatory Visit: Payer: Self-pay | Admitting: Family Medicine

## 2016-11-05 ENCOUNTER — Telehealth: Payer: Self-pay | Admitting: Family Medicine

## 2016-11-05 MED ORDER — FLUCONAZOLE 150 MG PO TABS: 150.0000 mg | ORAL_TABLET | Freq: Once | ORAL | 1 refills | Status: AC

## 2016-11-05 NOTE — Telephone Encounter (Signed)
Pt called back and stated that she has not tried OTC. Pt states she has a card from work and prefers to use Rx.   Call pt @ [phone].  Pharmacy is [facility] [address], [place]

## 2016-11-05 NOTE — Telephone Encounter (Signed)
Left message to call . ? Has patient tried OTC meds for yeast infection.

## 2016-11-05 NOTE — Telephone Encounter (Signed)
Rx sent 

## 2016-11-05 NOTE — Telephone Encounter (Signed)
Pt called and stated that she came in last Tuesday with the flu. Dr. Lacinda Axon prescribed antibiotics and now has a yeast infection. Can we please call something in. Please advise, thank you!  Call pt @ [phone] (may leave msg)  [place], [place]

## 2016-12-16 ENCOUNTER — Other Ambulatory Visit: Payer: Self-pay | Admitting: Obstetrics and Gynecology

## 2016-12-16 DIAGNOSIS — Z1231 Encounter for screening mammogram for malignant neoplasm of breast: Secondary | ICD-10-CM

## 2017-01-22 ENCOUNTER — Other Ambulatory Visit: Payer: Self-pay | Admitting: Obstetrics and Gynecology

## 2017-01-22 ENCOUNTER — Telehealth: Payer: Self-pay | Admitting: Obstetrics and Gynecology

## 2017-01-22 DIAGNOSIS — Z131 Encounter for screening for diabetes mellitus: Secondary | ICD-10-CM

## 2017-01-22 DIAGNOSIS — Z832 Family history of diseases of the blood and blood-forming organs and certain disorders involving the immune mechanism: Secondary | ICD-10-CM

## 2017-01-22 DIAGNOSIS — Z113 Encounter for screening for infections with a predominantly sexual mode of transmission: Secondary | ICD-10-CM

## 2017-01-22 DIAGNOSIS — D649 Anemia, unspecified: Secondary | ICD-10-CM

## 2017-01-22 DIAGNOSIS — E559 Vitamin D deficiency, unspecified: Secondary | ICD-10-CM

## 2017-01-22 DIAGNOSIS — Z13220 Encounter for screening for lipoid disorders: Secondary | ICD-10-CM

## 2017-01-22 DIAGNOSIS — Z0000 Encounter for general adult medical examination without abnormal findings: Secondary | ICD-10-CM

## 2017-01-22 NOTE — Progress Notes (Signed)
Pt due for yearly labs after bariatric surg and wt loss. Pt having leg pain and concerned about clotting disorder. Pt had fatty liver about 5-6 yrs ago with GI, but was never told and never had f/u labs. He suggested several liver labs to further assess. Pt's LFTs since wt loss/bariatric surgery have been normal. Reassurance to pt but will check labs.

## 2017-01-22 NOTE — Telephone Encounter (Signed)
Pt clarified labs needed. Pt to RTO for labs.

## 2017-01-22 NOTE — Telephone Encounter (Signed)
[facility] re: lab clarification.

## 2017-01-22 NOTE — Telephone Encounter (Signed)
Pt is calling to be schedule for her Annual Labs. Please advise when order is in so  I may schedule Patient to come in to be schedule. Pt is requesting to find out what her Blood type and Also would like  Alpha Anti-Trypsin  Phenopyno type , Ana Ama Ceruloplasmin , Anti Hcv , hep B surface antigin , Hep B surface AB drawn. Pt would like her liver Check. Pt would also like an Blood Clot screening also. Please advise. Or [email or web address] CB# [phone].

## 2017-01-24 ENCOUNTER — Other Ambulatory Visit: Payer: 59

## 2017-01-24 DIAGNOSIS — Z0000 Encounter for general adult medical examination without abnormal findings: Secondary | ICD-10-CM

## 2017-01-24 DIAGNOSIS — Z13220 Encounter for screening for lipoid disorders: Secondary | ICD-10-CM

## 2017-01-24 DIAGNOSIS — E559 Vitamin D deficiency, unspecified: Secondary | ICD-10-CM

## 2017-01-24 DIAGNOSIS — D649 Anemia, unspecified: Secondary | ICD-10-CM

## 2017-01-24 DIAGNOSIS — Z131 Encounter for screening for diabetes mellitus: Secondary | ICD-10-CM

## 2017-01-24 DIAGNOSIS — Z832 Family history of diseases of the blood and blood-forming organs and certain disorders involving the immune mechanism: Secondary | ICD-10-CM

## 2017-01-24 DIAGNOSIS — Z113 Encounter for screening for infections with a predominantly sexual mode of transmission: Secondary | ICD-10-CM

## 2017-01-28 LAB — PROTEIN C ACTIVITY: PROTEIN C ACTIVITY: 118 % (ref 73–180)

## 2017-01-28 LAB — CBC WITH DIFFERENTIAL/PLATELET
BASOS ABS: 0 10*3/uL (ref 0.0–0.2)
Basos: 1 %
EOS (ABSOLUTE): 0.1 10*3/uL (ref 0.0–0.4)
Eos: 2 %
HEMOGLOBIN: 13 g/dL (ref 11.1–15.9)
Hematocrit: 41.3 % (ref 34.0–46.6)
IMMATURE GRANULOCYTES: 0 %
Immature Grans (Abs): 0 10*3/uL (ref 0.0–0.1)
LYMPHS: 33 %
Lymphocytes Absolute: 1.6 10*3/uL (ref 0.7–3.1)
MCH: 28.4 pg (ref 26.6–33.0)
MCHC: 31.5 g/dL (ref 31.5–35.7)
MCV: 90 fL (ref 79–97)
MONOCYTES: 6 %
Monocytes Absolute: 0.3 10*3/uL (ref 0.1–0.9)
Neutrophils Absolute: 2.7 10*3/uL (ref 1.4–7.0)
Neutrophils: 58 %
PLATELETS: 246 10*3/uL (ref 150–379)
RBC: 4.58 x10E6/uL (ref 3.77–5.28)
RDW: 14.2 % (ref 12.3–15.4)
WBC: 4.7 10*3/uL (ref 3.4–10.8)

## 2017-01-28 LAB — CARDIOLIPIN ANTIBODIES, IGG, IGM, IGA: ANTICARDIOLIPIN IGG: 16 GPL U/mL — AB (ref 0–14)

## 2017-01-28 LAB — COMPREHENSIVE METABOLIC PANEL
A/G RATIO: 1.8 (ref 1.2–2.2)
ALT: 24 IU/L (ref 0–32)
AST: 29 IU/L (ref 0–40)
Albumin: 4.5 g/dL (ref 3.5–5.5)
Alkaline Phosphatase: 76 IU/L (ref 39–117)
BUN/Creatinine Ratio: 27 — ABNORMAL HIGH (ref 9–23)
BUN: 20 mg/dL (ref 6–24)
Bilirubin Total: 1 mg/dL (ref 0.0–1.2)
CALCIUM: 9.5 mg/dL (ref 8.7–10.2)
CO2: 21 mmol/L (ref 18–29)
CREATININE: 0.73 mg/dL (ref 0.57–1.00)
Chloride: 101 mmol/L (ref 96–106)
GFR, EST AFRICAN AMERICAN: 108 mL/min/{1.73_m2} (ref >59)
GFR, EST NON AFRICAN AMERICAN: 94 mL/min/{1.73_m2} (ref >59)
GLOBULIN, TOTAL: 2.5 g/dL (ref 1.5–4.5)
Glucose: 76 mg/dL (ref 65–99)
POTASSIUM: 4.3 mmol/L (ref 3.5–5.2)
Sodium: 143 mmol/L (ref 134–144)
TOTAL PROTEIN: 7 g/dL (ref 6.0–8.5)

## 2017-01-28 LAB — HEMOGLOBIN A1C
Est. average glucose Bld gHb Est-mCnc: 114 mg/dL
Hgb A1c MFr Bld: 5.6 % (ref 4.8–5.6)

## 2017-01-28 LAB — LUPUS ANTICOAGULANT
Dilute Viper Venom Time: 33.8 s (ref 0.0–47.0)
PTT Lupus Anticoagulant: 31 s (ref 0.0–51.9)
Thrombin Time: 17.6 s (ref 0.0–23.0)
dPT Confirm Ratio: 1.1 Ratio (ref 0.00–1.40)
dPT: 41.3 s (ref 0.0–55.0)

## 2017-01-28 LAB — COAG STUDIES INTERP REPORT

## 2017-01-28 LAB — VON WILLEBRAND PANEL
Factor VIII Activity: 96 % (ref 57–163)
VON WILLEBRAND AG: 100 % (ref 50–200)
Von Willebrand Factor: 69 % (ref 50–200)

## 2017-01-28 LAB — LIPID PANEL
Chol/HDL Ratio: 2.1 ratio (ref 0.0–4.4)
Cholesterol, Total: 234 mg/dL — ABNORMAL HIGH (ref 100–199)
HDL: 111 mg/dL (ref >39)
LDL Calculated: 112 mg/dL — ABNORMAL HIGH (ref 0–99)
TRIGLYCERIDES: 54 mg/dL (ref 0–149)
VLDL CHOLESTEROL CAL: 11 mg/dL (ref 5–40)

## 2017-01-28 LAB — MAGNESIUM: Magnesium: 2.3 mg/dL (ref 1.6–2.3)

## 2017-01-28 LAB — IRON AND TIBC
IRON SATURATION: 23 % (ref 15–55)
IRON: 95 ug/dL (ref 27–159)
Total Iron Binding Capacity: 414 ug/dL (ref 250–450)
UIBC: 319 ug/dL (ref 131–425)

## 2017-01-28 LAB — B12 AND FOLATE PANEL
Folate: >20 ng/mL (ref >3.0)
VITAMIN B 12: 880 pg/mL (ref 232–1245)

## 2017-01-28 LAB — HEPATITIS B SURFACE ANTIGEN: Hepatitis B Surface Ag: NEGATIVE

## 2017-01-28 LAB — FERRITIN: Ferritin: 14 ng/mL — ABNORMAL LOW (ref 15–150)

## 2017-01-28 LAB — PROTEIN S ACTIVITY: Protein S Activity: 89 % (ref 63–140)

## 2017-01-28 LAB — FACTOR 5 LEIDEN

## 2017-01-28 LAB — HEPATITIS C ANTIBODY

## 2017-01-28 LAB — VITAMIN D 25 HYDROXY (VIT D DEFICIENCY, FRACTURES): VIT D 25 HYDROXY: 47.8 ng/mL (ref 30.0–100.0)

## 2017-01-28 LAB — AMYLASE: AMYLASE: 83 U/L (ref 31–124)

## 2017-01-29 ENCOUNTER — Other Ambulatory Visit: Payer: Self-pay | Admitting: Obstetrics and Gynecology

## 2017-01-29 DIAGNOSIS — R899 Unspecified abnormal finding in specimens from other organs, systems and tissues: Secondary | ICD-10-CM | POA: Insufficient documentation

## 2017-01-29 DIAGNOSIS — M779 Enthesopathy, unspecified: Secondary | ICD-10-CM

## 2017-01-31 ENCOUNTER — Telehealth: Payer: Self-pay

## 2017-01-31 NOTE — Telephone Encounter (Signed)
Pt calling stating she realized after she spoke c [facility] about her labs results that she changed her multivitamin to one without iron.  She will go back to multivitamin c iron.  Her question is how long should she be on this before ferritin level is ck'd.? [phone].   Pt aware [facility] not in today but will be in [place].

## 2017-02-03 ENCOUNTER — Other Ambulatory Visit: Payer: Self-pay | Admitting: Obstetrics and Gynecology

## 2017-02-03 DIAGNOSIS — D508 Other iron deficiency anemias: Secondary | ICD-10-CM

## 2017-02-03 NOTE — Progress Notes (Signed)
Pt with low ferritin. Pt resumed Fe supp in [facility]. REchk with other labs.

## 2017-02-03 NOTE — Telephone Encounter (Signed)
LM for pt. Will rechk 7/18 with other labs.

## 2017-02-21 ENCOUNTER — Ambulatory Visit: Payer: 59

## 2017-02-23 ENCOUNTER — Encounter: Payer: Self-pay | Admitting: *Deleted

## 2017-02-23 ENCOUNTER — Inpatient Hospital Stay
Admission: EM | Admit: 2017-02-23 | Discharge: 2017-02-24 | DRG: 281 | Disposition: A | Discharge status: Home / Self Care | Payer: 59 | Attending: Internal Medicine | Admitting: Internal Medicine

## 2017-02-23 ENCOUNTER — Emergency Department: Payer: 59

## 2017-02-23 DIAGNOSIS — E785 Hyperlipidemia, unspecified: Secondary | ICD-10-CM

## 2017-02-23 DIAGNOSIS — I959 Hypotension, unspecified: Secondary | ICD-10-CM | POA: Diagnosis present

## 2017-02-23 DIAGNOSIS — E038 Other specified hypothyroidism: Secondary | ICD-10-CM

## 2017-02-23 DIAGNOSIS — Z880 Allergy status to penicillin: Secondary | ICD-10-CM

## 2017-02-23 DIAGNOSIS — R079 Chest pain, unspecified: Secondary | ICD-10-CM

## 2017-02-23 DIAGNOSIS — I471 Supraventricular tachycardia: Secondary | ICD-10-CM | POA: Diagnosis present

## 2017-02-23 DIAGNOSIS — Z91040 Latex allergy status: Secondary | ICD-10-CM

## 2017-02-23 DIAGNOSIS — Z9884 Bariatric surgery status: Secondary | ICD-10-CM

## 2017-02-23 DIAGNOSIS — I248 Other forms of acute ischemic heart disease: Secondary | ICD-10-CM | POA: Diagnosis present

## 2017-02-23 DIAGNOSIS — Z888 Allergy status to other drugs, medicaments and biological substances status: Secondary | ICD-10-CM

## 2017-02-23 DIAGNOSIS — I21A1 Myocardial infarction type 2: Principal | ICD-10-CM | POA: Diagnosis present

## 2017-02-23 DIAGNOSIS — E559 Vitamin D deficiency, unspecified: Secondary | ICD-10-CM | POA: Diagnosis present

## 2017-02-23 DIAGNOSIS — Z8249 Family history of ischemic heart disease and other diseases of the circulatory system: Secondary | ICD-10-CM | POA: Diagnosis not present

## 2017-02-23 DIAGNOSIS — I2489 Other forms of acute ischemic heart disease: Secondary | ICD-10-CM | POA: Diagnosis present

## 2017-02-23 DIAGNOSIS — I214 Non-ST elevation (NSTEMI) myocardial infarction: Secondary | ICD-10-CM | POA: Diagnosis not present

## 2017-02-23 DIAGNOSIS — E039 Hypothyroidism, unspecified: Secondary | ICD-10-CM | POA: Diagnosis present

## 2017-02-23 DIAGNOSIS — Z79899 Other long term (current) drug therapy: Secondary | ICD-10-CM | POA: Diagnosis not present

## 2017-02-23 LAB — CBC
HEMATOCRIT: 41.3 % (ref 35.0–47.0)
Hemoglobin: 14.1 g/dL (ref 12.0–16.0)
MCH: 29.5 pg (ref 26.0–34.0)
MCHC: 34.1 g/dL (ref 32.0–36.0)
MCV: 86.5 fL (ref 80.0–100.0)
Platelets: 245 10*3/uL (ref 150–440)
RBC: 4.78 MIL/uL (ref 3.80–5.20)
RDW: 14.1 % (ref 11.5–14.5)
WBC: 5.8 10*3/uL (ref 3.6–11.0)

## 2017-02-23 LAB — BASIC METABOLIC PANEL
Anion gap: 9 (ref 5–15)
BUN: 25 mg/dL — ABNORMAL HIGH (ref 6–20)
CHLORIDE: 104 mmol/L (ref 101–111)
CO2: 26 mmol/L (ref 22–32)
CREATININE: 0.53 mg/dL (ref 0.44–1.00)
Calcium: 9.7 mg/dL (ref 8.9–10.3)
GFR calc non Af Amer: >60 mL/min (ref >60)
Glucose, Bld: 99 mg/dL (ref 65–99)
POTASSIUM: 4.7 mmol/L (ref 3.5–5.1)
Sodium: 139 mmol/L (ref 135–145)

## 2017-02-23 LAB — TROPONIN I
Troponin I: <0.03 ng/mL (ref <0.03)
Troponin I: 0.13 ng/mL (ref <0.03)

## 2017-02-23 MED ORDER — MORPHINE SULFATE (PF) 2 MG/ML IV SOLN: 1.0000 mg | INTRAVENOUS | Status: DC | PRN

## 2017-02-23 MED ORDER — ATORVASTATIN CALCIUM 20 MG PO TABS: 80.0000 mg | ORAL_TABLET | Freq: Every day | ORAL | Status: DC

## 2017-02-23 MED ORDER — ONDANSETRON HCL 4 MG/2ML IJ SOLN: 4.0000 mg | Freq: Four times a day (QID) | INTRAMUSCULAR | Status: DC | PRN

## 2017-02-23 MED ORDER — ALPRAZOLAM 0.25 MG PO TABS
0.2500 mg | ORAL_TABLET | Freq: Two times a day (BID) | ORAL | Status: DC | PRN
  Administered 2017-02-24: 0.25 mg via ORAL
  Filled 2017-02-23: qty 1

## 2017-02-23 MED ORDER — ASPIRIN 81 MG PO CHEW: 324.0000 mg | CHEWABLE_TABLET | Freq: Once | ORAL | Status: DC

## 2017-02-23 MED ORDER — ASPIRIN 81 MG PO CHEW
324.0000 mg | CHEWABLE_TABLET | ORAL | Status: AC
  Administered 2017-02-24: 324 mg via ORAL
  Filled 2017-02-23: qty 4

## 2017-02-23 MED ORDER — DIAZEPAM 2 MG PO TABS
2.0000 mg | ORAL_TABLET | Freq: Once | ORAL | Status: AC
  Administered 2017-02-23: 2 mg via ORAL
  Filled 2017-02-23: qty 1

## 2017-02-23 MED ORDER — ASPIRIN 300 MG RE SUPP: 300.0000 mg | RECTAL | Status: AC

## 2017-02-23 MED ORDER — NITROGLYCERIN 0.4 MG SL SUBL: 0.4000 mg | SUBLINGUAL_TABLET | SUBLINGUAL | Status: DC | PRN

## 2017-02-23 MED ORDER — ASPIRIN EC 81 MG PO TBEC
81.0000 mg | DELAYED_RELEASE_TABLET | Freq: Every day | ORAL | Status: DC
  Administered 2017-02-24: 81 mg via ORAL
  Filled 2017-02-23: qty 1

## 2017-02-23 MED ORDER — ACETAMINOPHEN 325 MG PO TABS: 650.0000 mg | ORAL_TABLET | ORAL | Status: DC | PRN

## 2017-02-23 NOTE — H&P (Signed)
[place] @ [facility] Psychiatric Admission History and Physical Harvie Bridge, D.O.  ---------------------------------------------------------------------------------------------------------------------   PATIENT NAME: Michelle Armstrong MR#: [id number] DATE OF BIRTH: 15-Feb-1961 DATE OF ADMISSION: 02/23/2017 PRIMARY CARE PHYSICIAN: Coral Spikes, DO  REQUESTING/REFERRING PHYSICIAN: ED Dr. Clearnce Hasten   CHIEF COMPLAINT: Chief Complaint  Patient presents with  . Chest Pain    HISTORY OF PRESENT ILLNESS: Michelle Armstrong is a 56 y.o. female with a known history of HLD, hypothyroidism, SVT presents to the emergency department for evaluation of chest pain.  Patient was in a usual state of health until This afternoon when she describes sudden onset of midsternal chest pain while watching television. Patient is described as pressure that radiated to the left shoulder and arm was associated with shortness of breath,Diaphoresis, upper back pain, nausea, anxiety and dizziness. Patient denies fevers/chills, weakness,  N/V/C/D, abdominal pain, dysuria/frequency, changes in mental status.   Of note patient was treated for SVT with ablation about 10 years ago. Otherwise there has been no change in status. Patient has been taking medication as prescribed and there has been no recent change in medication or diet.  There has been no recent illness, travel or sick contacts.    EMS/ED COURSE:   Patient received Valium. She had initially refused aspirin secondary to ulcers.  Medical admission was requested for further management and workup of NSTEMI.   PAST MEDICAL HISTORY: Hyperlipidemia, hypothyroidism, SVT, vitamin D deficiency  PAST SURGICAL HISTORY: Hysterectomy, cholecystectomy, hernia repair, colonoscopy  SOCIAL HISTORY: Social History  Substance Use Topics  . Smoking status: Never Smoker  . Smokeless tobacco: Never Used     Comment: tobacco use- no   . Alcohol use 0.0 oz/week    FAMILY HISTORY: Family History  Problem Relation Age of Onset  . Heart disease Father   . Obesity Father   . Hyperlipidemia Father   . Thyroid disease Sister    MEDICATIONS AT HOME: Prior to Admission medications   Medication Sig Start Date End Date Taking? Authorizing Provider  CALCIUM PO Take 2 tablets by mouth daily.    [provider]  doxycycline (VIBRA-TABS) 100 MG tablet Take 1 tablet (100 mg total) by mouth 2 (two) times daily. 10/30/16   Coral Spikes, DO  Multiple Vitamins-Minerals (MULTIVITAMIN WITH MINERALS) tablet Take 1 tablet by mouth daily.      [provider]  ondansetron (ZOFRAN) 4 MG tablet Take 1 tablet (4 mg total) by mouth every 8 (eight) hours as needed for nausea or vomiting. 10/29/16   Coral Spikes, DO  oseltamivir (TAMIFLU) 75 MG capsule Take 1 capsule (75 mg total) by mouth 2 (two) times daily. 10/29/16   Coral Spikes, DO      DRUG ALLERGIES: Allergies  Allergen Reactions  . Cephalexin     Rash & dizziness  . Latex     rash  . Metoprolol     REACTION: dizziness, slurred speech   REVIEW OF SYSTEMS: CONSTITUTIONAL: No fatigue, weakness, fever, chills, weight gain/loss, headache EYES: No blurry or double vision. ENT: No tinnitus, postnasal drip, redness or soreness of the oropharynx. RESPIRATORY: Positive dyspnea. No cough, wheeze, hemoptysis. CARDIOVASCULAR: Positive chest pain, negative orthopnea, palpitations, syncope. GASTROINTESTINAL: Positive nausea. No vomiting, constipation, diarrhea, abdominal pain. No hematemesis, melena or hematochezia. GENITOURINARY: No dysuria, frequency, hematuria. ENDOCRINE: No polyuria or nocturia. No heat or cold intolerance. HEMATOLOGY: No anemia, bruising, bleeding. INTEGUMENTARY: No rashes, ulcers, lesions. MUSCULOSKELETAL: No pain, arthritis, swelling, gout. NEUROLOGIC: No numbness, tingling, weakness  or ataxia. No seizure-type activity. PSYCHIATRIC: Positive anxiety, Diaphoresis. No  depression, insomnia.  PHYSICAL EXAMINATION: VITAL SIGNS: Blood pressure 102/64, pulse 74, temperature 98.5 F (36.9 C), temperature source Oral, resp. rate 16, height 5\' 7"  (1.702 m), weight 68.9 kg (152 lb), SpO2 100 %.  GENERAL: 56 y.o.-year-old female patient, well-developed, well-nourished lying in the bed in no acute distress.  Pleasant and cooperative.   HEENT: Head atraumatic, normocephalic. Pupils equal, round, reactive to light and accommodation. No scleral icterus. Extraocular muscles intact. Oropharynx is clear. Mucus membranes moist. NECK: Supple, full range of motion. No JVD, no bruit heard. No cervical lymphadenopathy. CHEST: Normal breath sounds bilaterally. No wheezing, rales, rhonchi or crackles. No use of accessory muscles of respiration.  No reproducible chest wall tenderness.  CARDIOVASCULAR: S1, S2 normal. No murmurs, rubs, or gallops appreciated. Cap refill <2 seconds. ABDOMEN: Soft, nontender, nondistended. No rebound, guarding, rigidity. Normoactive bowel sounds present in all four quadrants. No organomegaly or mass. EXTREMITIES: Full range of motion. No pedal edema, cyanosis, or clubbing. NEUROLOGIC: Cranial nerves II through XII are grossly intact with no focal sensorimotor deficit. PSYCHIATRIC: The patient is alert and oriented x 3. Normal affect, mood, thought content. SKIN: Warm, dry, and intact without obvious rash, lesion, or ulcer.  LABORATORY PANEL:  CBC  Recent Labs Lab 02/23/17 1529  WBC 5.8  HGB 14.1  HCT 41.3  PLT 245   ----------------------------------------------------------------------------------------------------------------- Chemistries  Recent Labs Lab 02/23/17 1529  NA 139  K 4.7  CL 104  CO2 26  GLUCOSE 99  BUN 25*  CREATININE 0.53  CALCIUM 9.7   ------------------------------------------------------------------------------------------------------------------ Cardiac Enzymes  Recent Labs Lab 02/23/17 2022  TROPONINI  0.13*   ------------------------------------------------------------------------------------------------------------------  RADIOLOGY: Dg Chest 2 View  Result Date: 02/23/2017 CLINICAL DATA:  Sudden onset midsternal chest pain today. Shortness of breath and dizziness. EXAM: CHEST  2 VIEW COMPARISON:  02/15/2015. FINDINGS: Normal sized heart. Clear lungs with normal vascularity. Unremarkable bones. Cholecystectomy clips. IMPRESSION: No acute abnormality. Electronically Signed   By: Claudie Revering M.D.   On: 02/23/2017 16:14    EKG: Normal sinus rhythm at 74 bpm with normal axis, TWI in V2 and nonspecific ST-T wave changes.   IMPRESSION AND PLAN:  This is a 56 y.o. female with a history of HLD, hypothyroidism, SVT now being admitted with:  1. NSTEMI - Admit to inpatient with telemetry monitoring. - Heparin drip - Morphine, nitro, beta-blocker aspirin and statin ordered. Metoprolol ordered as patient had side effect to medication and past not a true allergy.  - Trend troponins, check lipids and TSH. - Check echo - Cardiology consult requested - Patient previously followed with Dr. Caryl Comes  Admission status: Inpatient, telemetry Diet/Nutrition: NPO after midnight Fluids: HL DVT Px: Heparin, SCDs and early ambulation Code Status: Full Disposition Plan: To home in 1-2 days  All the records are reviewed and case discussed with ED provider. Management plans discussed with the patient and/or family who express understanding and agree with plan of care.   TOTAL TIME TAKING CARE OF THIS PATIENT: 60 minutes.   Zai Chmiel D.O. on 02/23/2017 at 9:28 PM Between 7am to 6pm - Pager - [phone] After 6pm go to [email or web address] - [employer] [facility] [place] [facility] Office [phone] CC: Primary care physician; Coral Spikes, DO     Note: This dictation was prepared with Dragon dictation along with smaller phrase technology. Any transcriptional errors that result  from this process are unintentional.

## 2017-02-23 NOTE — Progress Notes (Signed)
Patient does not want to be on a heparin drip. Notified MD Hugelmeyer and MD to come see patient to discuss. Earleen Reaper, RN

## 2017-02-23 NOTE — ED Triage Notes (Signed)
States she was watching TV today and had sudden onset midsternal CP that goes under her breast, states left arm tingling and tightness, states SOB and dizziness, pt hyperventilating, awake and alert, states hx of SVT with an ablation

## 2017-02-23 NOTE — ED Provider Notes (Signed)
[facility] Emergency Department Provider Note  ____________________________________________   First MD Initiated Contact with Patient 02/23/17 1940     (approximate)  I have reviewed the triage vital signs and the nursing notes.   HISTORY  Chief Complaint Chest Pain   HPI Michelle Armstrong is a 56 y.o. female with a history of SVT who is presenting to the emergency department today with chest pain that started when she was sitting this afternoon. She says the pain is a 10 out of 10 and radiating down her left arm. It was associated with diaphoresis. She reported to the emergency department where she was initially seen in flex care and had an initially normal troponin and unchanged EKG from her previous. However, her second troponin proved to be elevated and she was transferred to the major side. She is having only a "soreness" to the left side of her chest at this time. She is not able to rate this on a scale of 1-10 because she says the pain is only very mild. She refused aspirin because she says that NSAIDs are not tolerated because of stomach issues and ulcers that she has had in the past. Denies any shortness of breath at this time. Says that she doesn't a family history of cardiac disease but that it was likely due to modified or risk factors and her father. The patient denies any drinking, drug use or cigarettes.   Past Medical History:  Diagnosis Date  . HLD (hyperlipidemia)   . Hypothyroidism    possible h/o  . Obesity   . 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 Vitamin D deficiency    low    Patient Active Problem List   Diagnosis Date Noted  . NSTEMI (non-ST elevated myocardial infarction) ([place]) 02/23/2017  . Abnormal laboratory test 01/29/2017  . Influenza-like illness 10/29/2016  . Chest tightness 12/27/2015  . Diffuse pain 12/27/2015  . Lipoma of thigh 07/17/2015  . Seborrheic keratosis 07/17/2015  . Routine  general medical examination at a health care facility 06/15/2014  . SVT s/p ablation 01/09/2010    Past Surgical History:  Procedure Laterality Date  . ABDOMINAL HYSTERECTOMY  1999   endometriosis  . BARIATRIC SURGERY  12/2010  . COLONOSCOPY WITH PROPOFOL N/A 07/10/2015   Procedure: COLONOSCOPY WITH PROPOFOL;  Surgeon: Lollie Sails, MD;  Location: [facility] ENDOSCOPY;  Service: Endoscopy;  Laterality: N/A;  . GALLBLADDER SURGERY    . HERNIA REPAIR  12/2010    Prior to Admission medications   Medication Sig Start Date End Date Taking? Authorizing Provider  Calcium Carbonate 500 MG CHEW Chew 1 tablet by mouth daily.    [provider]  Multiple Vitamins-Minerals (MULTIVITAMIN PO) Take 1 tablet by mouth daily.    [provider]  Multiple Vitamins-Minerals (MULTIVITAMIN WITH MINERALS) tablet Take 1 tablet by mouth daily.      [provider]  pantoprazole (PROTONIX) 40 MG tablet Take 1 tablet by mouth daily.    [provider]  sucralfate (CARAFATE) 1 GM/10ML suspension Take 10 mLs by mouth 4 (four) times daily.    [provider]    Allergies Cephalexin; Latex; and Metoprolol  Family History  Problem Relation Age of Onset  . Heart disease Father   . Obesity Father   . Hyperlipidemia Father   . Thyroid disease Sister     Social History Social History  Substance Use Topics  . Smoking status: Never Smoker  .  Smokeless tobacco: Never Used     Comment: tobacco use- no   . Alcohol use 0.0 oz/week    Review of Systems  Constitutional: No fever/chills Eyes: No visual changes. ENT: No sore throat. Cardiovascular:as above Respiratory: Denies shortness of breath. Gastrointestinal: No abdominal pain.  No nausea, no vomiting.  No diarrhea.  No constipation. Genitourinary: Negative for dysuria. Musculoskeletal: Negative for back pain. Skin: Negative for rash. Neurological: Negative for headaches, focal weakness or  numbness.   ____________________________________________   PHYSICAL EXAM:  VITAL SIGNS: ED Triage Vitals [02/23/17 1527]  Enc Vitals Group     BP 116/90     Pulse Rate 73     Resp 18     Temp 98.5 F (36.9 C)     Temp Source Oral     SpO2 100 %     Weight 152 lb (68.9 kg)     Height 5\' 7"  (1.702 m)     Head Circumference      Peak Flow      Pain Score 10     Pain Loc      Pain Edu?      Excl. in [place]?     Constitutional: Alert and oriented. Well appearing and in no acute distress. Eyes: Conjunctivae are normal.  Head: Atraumatic. Nose: No congestion/rhinnorhea. Mouth/Throat: Mucous membranes are moist.  Neck: No stridor.   Cardiovascular: Normal rate, regular rhythm. Grossly normal heart sounds.  Good peripheral circulation with equal and bilateral radial pulses. Respiratory: Normal respiratory effort.  No retractions. Lungs CTAB. Gastrointestinal: Soft and nontender. No distention.  Musculoskeletal: No lower extremity tenderness nor edema.  No joint effusions. Neurologic:  Normal speech and language. No gross focal neurologic deficits are appreciated. Skin:  Skin is warm, dry and intact. No rash noted. Psychiatric: Mood and affect are normal. Speech and behavior are normal.  ____________________________________________   LABS (all labs ordered are listed, but only abnormal results are displayed)  Labs Reviewed  BASIC METABOLIC PANEL - Abnormal; Notable for the following:       Result Value   BUN 25 (*)    All other components within normal limits  TROPONIN I - Abnormal; Notable for the following:    Troponin I 0.13 (*)    All other components within normal limits  CBC  TROPONIN I   ____________________________________________  EKG  ED ECG REPORT I, Doran Stabler, the attending physician, personally viewed and interpreted this ECG.   Date: 02/23/2017  EKG Time: 1524  Rate: 74  Rhythm: normal sinus rhythm  Axis: normal  Intervals:none  ST&T  Change: Lateral ST elevations which are less than 1 mm in V4 through 6 are seen on previous EKGs. No ST depressions. Single T-wave inversion in V2.  ____________________________________________  RADIOLOGY  No acute findings on chest x-ray. ____________________________________________   PROCEDURES  Procedure(s) performed:   Procedures  Critical Care performed:   ____________________________________________   INITIAL IMPRESSION / ASSESSMENT AND PLAN / ED COURSE  Pertinent labs & imaging results that were available during my care of the patient were reviewed by me and considered in my medical decision making (see chart for details).  ----------------------------------------- 10:31 PM on 02/23/2017 -----------------------------------------  Plan to admit patient to the hospital. Discussed with Dr. Ara Kussmaul.  Patient near pain-free at this time. We'll hold on heparin. The patient says that she would rather not have heparin as well.  Clinical Course as of Feb 24 2227  Sun Feb 23, 2017  1927  EKG 12-Lead [JT]  1928 EKG 12-Lead [JT]    Clinical Course User Index [JT] Sigurd Sos, Student-PA     ____________________________________________   FINAL CLINICAL IMPRESSION(S) / ED DIAGNOSES  Final diagnoses:  Chest pain, unspecified type      NEW MEDICATIONS STARTED DURING THIS VISIT:  New Prescriptions   No medications on file     Note:  This document was prepared using Dragon voice recognition software and may include unintentional dictation errors.     Orbie Pyo, MD 02/23/17 2231

## 2017-02-23 NOTE — Progress Notes (Signed)
Patient arrived to 2A Room 239. Patient denies pain and all questions answered. Patient oriented to unit and Fall Safety Plan signed. Skin assessment completed with Crystal RN and skin intact. A&Ox4, VSS, and NSR on verified tele-box #[id number]. No complaints of pain at this time. Nursing staff will continue to monitor for any changes in patient status. Earleen Reaper, RN

## 2017-02-23 NOTE — ED Provider Notes (Signed)
[facility] Emergency Department Provider Note ____________________________________________  Time seen: 77  I have reviewed the triage vital signs and the nursing notes.  HISTORY  Chief Complaint  Chest Pain  HPI Michelle Armstrong is a 56 y.o. female presents to the ED for evaluation of sudden onset of midsternal chest pain with onset this afternoon. Patient as She was sitting comfortably watching television at the onset of her pain. She describes a pressure that seemed to radiate from her central substernal tract chest around the left shoulder and arm. She also describes referred pain and tingling down to the fingertips bilaterally. She reports being short of breath, dizzy, and nauseated during the onset of the symptoms. She was also hyperventilating and "panicking," according to her husband. She presents now with slightly improved chest pain with discomfort down to a 4/10. She does not had any medications in the interim and denies any history of acute coronary symptoms, acute MI, or angina. She does have a history of SVT which was treated by ablation some 10 years prior. She had a similar episode of chest pain that had concurrent tachycardia, and was admitted to hospital in [place] for work-up after an elevated troponin. She reports normal cardiac workups intermittently over the last 10 years. She denies any nausea, vomiting, or recent illness.  Past Medical History:  Diagnosis Date  . HLD (hyperlipidemia)   . Hypothyroidism    possible h/o  . Obesity   . 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 Vitamin D deficiency    low    Patient Active Problem List   Diagnosis Date Noted  . Abnormal laboratory test 01/29/2017  . Influenza-like illness 10/29/2016  . Chest tightness 12/27/2015  . Diffuse pain 12/27/2015  . Lipoma of thigh 07/17/2015  . Seborrheic keratosis 07/17/2015  . Routine general medical examination at a health care  facility 06/15/2014  . SVT s/p ablation 01/09/2010    Past Surgical History:  Procedure Laterality Date  . ABDOMINAL HYSTERECTOMY  1999   endometriosis  . BARIATRIC SURGERY  12/2010  . COLONOSCOPY WITH PROPOFOL N/A 07/10/2015   Procedure: COLONOSCOPY WITH PROPOFOL;  Surgeon: Lollie Sails, MD;  Location[facility] ENDOSCOPY;  Service: Endoscopy;  Laterality: N/A;  . GALLBLADDER SURGERY    . HERNIA REPAIR  12/2010    Prior to Admission medications   Medication Sig Start Date End Date Taking? Authorizing Provider  CALCIUM PO Take 2 tablets by mouth daily.    [provider]  doxycycline (VIBRA-TABS) 100 MG tablet Take 1 tablet (100 mg total) by mouth 2 (two) times daily. 10/30/16   Coral Spikes, DO  Multiple Vitamins-Minerals (MULTIVITAMIN WITH MINERALS) tablet Take 1 tablet by mouth daily.      [provider]  ondansetron (ZOFRAN) 4 MG tablet Take 1 tablet (4 mg total) by mouth every 8 (eight) hours as needed for nausea or vomiting. 10/29/16   Coral Spikes, DO  oseltamivir (TAMIFLU) 75 MG capsule Take 1 capsule (75 mg total) by mouth 2 (two) times daily. 10/29/16   Coral Spikes, DO    Allergies Cephalexin; Latex; and Metoprolol  Family History  Problem Relation Age of Onset  . Heart disease Father   . Obesity Father   . Hyperlipidemia Father   . Thyroid disease Sister     Social History Social History  Substance Use Topics  . Smoking status: Never Smoker  . Smokeless tobacco: Never Used  Comment: tobacco use- no   . Alcohol use 0.0 oz/week    Review of Systems  Constitutional: Negative for fever. Eyes: Negative for visual changes. ENT: Negative for sore throat. Cardiovascular: Positive for chest pain with right upper extremities referral. Respiratory: Positive for shortness of breath. Gastrointestinal: Negative for abdominal pain, vomiting and diarrhea. Genitourinary: Negative for dysuria. Musculoskeletal: Negative for back pain. Skin: Negative  for rash. Neurological: Negative for headaches, focal weakness or numbness. ____________________________________________  PHYSICAL EXAM:  VITAL SIGNS: ED Triage Vitals [02/23/17 1527]  Enc Vitals Group     BP 116/90     Pulse Rate 73     Resp 18     Temp 98.5 F (36.9 C)     Temp Source Oral     SpO2 100 %     Weight 152 lb (68.9 kg)     Height 5\' 7"  (1.702 m)     Head Circumference      Peak Flow      Pain Score 10     Pain Loc      Pain Edu?      Excl. in [place]?     Constitutional: Alert and oriented. Well appearing and in no distress. Head: Normocephalic and atraumatic. Eyes: Conjunctivae are normal. Normal extraocular movements Mouth/Throat: Mucous membranes are moist. Neck: Supple. No thyromegaly. Cardiovascular: Normal rate, regular rhythm. Normal distal pulses. Respiratory: Normal respiratory effort. No wheezes/rales/rhonchi. Gastrointestinal: Soft and nontender. No distention. Musculoskeletal: Nontender with normal range of motion in all extremities.  Neurologic:  Normal gait without ataxia. Normal speech and language. No gross focal neurologic deficits are appreciated. Skin:  Skin is warm, dry and intact. No rash noted. Psychiatric: Mood and affect are normal. Patient exhibits appropriate insight and judgment. ____________________________________________   LABS (pertinent positives/negatives) Labs Reviewed  BASIC METABOLIC PANEL - Abnormal; Notable for the following:       Result Value   BUN 25 (*)    All other components within normal limits  TROPONIN I - Abnormal; Notable for the following:    Troponin I 0.13 (*)    All other components within normal limits  CBC  TROPONIN I  ____________________________________________  EKG  NSR 74 bpm No STEMI ____________________________________________   RADIOLOGY  CXR  IMPRESSION: No acute abnormality. ____________________________________________  PROCEDURES  Valium 2 mg  PO ____________________________________________  INITIAL IMPRESSION / ASSESSMENT AND PLAN / ED COURSE  Patient with a presentation of acute chest pain with onset while at rest. Her labs are reassuring initially but her repeat troponin is elevated at 0.13. Patient will be admitted to the hospitalist service for evaluation of chest pain.  Clinical Course as of Feb 24 2104  Sun Feb 23, 2017  1927 EKG 12-Lead [JT]  1928 EKG 12-Lead [JT]    Clinical Course User Index [JT] Joelene Millin   ____________________________________________  FINAL CLINICAL IMPRESSION(S) / ED DIAGNOSES  Final diagnoses:  Chest pain, unspecified type     Melvenia Needles, PA-C 02/23/17 2122    Orbie Pyo, MD 02/23/17 2351

## 2017-02-23 NOTE — ED Notes (Signed)
Result given to [place], [place] and to [facility] charge RN

## 2017-02-24 ENCOUNTER — Ambulatory Visit: Payer: 59 | Admitting: Obstetrics and Gynecology

## 2017-02-24 ENCOUNTER — Inpatient Hospital Stay (HOSPITAL_COMMUNITY)
Admit: 2017-02-24 | Discharge: 2017-02-24 | Disposition: A | Discharge status: Home / Self Care | Payer: 59 | Attending: Family Medicine | Admitting: Family Medicine

## 2017-02-24 ENCOUNTER — Encounter: Payer: Self-pay | Admitting: Family Medicine

## 2017-02-24 ENCOUNTER — Telehealth: Payer: Self-pay | Admitting: Family Medicine

## 2017-02-24 ENCOUNTER — Encounter
Admission: EM | Disposition: A | Discharge status: Home / Self Care | Payer: Self-pay | Source: Home / Self Care | Attending: Internal Medicine

## 2017-02-24 DIAGNOSIS — I214 Non-ST elevation (NSTEMI) myocardial infarction: Secondary | ICD-10-CM

## 2017-02-24 DIAGNOSIS — E785 Hyperlipidemia, unspecified: Secondary | ICD-10-CM

## 2017-02-24 DIAGNOSIS — E039 Hypothyroidism, unspecified: Secondary | ICD-10-CM

## 2017-02-24 DIAGNOSIS — R079 Chest pain, unspecified: Secondary | ICD-10-CM

## 2017-02-24 DIAGNOSIS — I959 Hypotension, unspecified: Secondary | ICD-10-CM

## 2017-02-24 DIAGNOSIS — I248 Other forms of acute ischemic heart disease: Secondary | ICD-10-CM

## 2017-02-24 DIAGNOSIS — E038 Other specified hypothyroidism: Secondary | ICD-10-CM

## 2017-02-24 HISTORY — PX: LEFT HEART CATH AND CORONARY ANGIOGRAPHY: CATH118249

## 2017-02-24 LAB — LIPID PANEL
Cholesterol: 209 mg/dL — ABNORMAL HIGH (ref 0–200)
HDL: 91 mg/dL (ref >40)
LDL CALC: 110 mg/dL — AB (ref 0–99)
TRIGLYCERIDES: 41 mg/dL (ref <150)
Total CHOL/HDL Ratio: 2.3 RATIO
VLDL: 8 mg/dL (ref 0–40)

## 2017-02-24 LAB — TROPONIN I
TROPONIN I: 0.46 ng/mL — AB (ref <0.03)
TROPONIN I: 0.67 ng/mL — AB (ref <0.03)

## 2017-02-24 LAB — CBC
HEMATOCRIT: 38.3 % (ref 35.0–47.0)
HEMOGLOBIN: 13.1 g/dL (ref 12.0–16.0)
MCH: 29.2 pg (ref 26.0–34.0)
MCHC: 34.1 g/dL (ref 32.0–36.0)
MCV: 85.5 fL (ref 80.0–100.0)
Platelets: 219 10*3/uL (ref 150–440)
RBC: 4.48 MIL/uL (ref 3.80–5.20)
RDW: 13.8 % (ref 11.5–14.5)
WBC: 6.7 10*3/uL (ref 3.6–11.0)

## 2017-02-24 LAB — BASIC METABOLIC PANEL
Anion gap: 5 (ref 5–15)
BUN: 21 mg/dL — ABNORMAL HIGH (ref 6–20)
CALCIUM: 8.9 mg/dL (ref 8.9–10.3)
CHLORIDE: 107 mmol/L (ref 101–111)
CO2: 29 mmol/L (ref 22–32)
CREATININE: 0.54 mg/dL (ref 0.44–1.00)
GFR calc Af Amer: >60 mL/min (ref >60)
GFR calc non Af Amer: >60 mL/min (ref >60)
GLUCOSE: 91 mg/dL (ref 65–99)
Potassium: 4 mmol/L (ref 3.5–5.1)
Sodium: 141 mmol/L (ref 135–145)

## 2017-02-24 LAB — MAGNESIUM: Magnesium: 2.1 mg/dL (ref 1.7–2.4)

## 2017-02-24 LAB — PROTIME-INR
INR: 1.1
PROTHROMBIN TIME: 14.2 s (ref 11.4–15.2)

## 2017-02-24 LAB — ECHOCARDIOGRAM COMPLETE
Height: 67 in
WEIGHTICAEL: 2630.4 [oz_av]

## 2017-02-24 LAB — FIBRIN DERIVATIVES D-DIMER (ARMC ONLY): Fibrin derivatives D-dimer (ARMC): 330.99 (ref 0.00–499.00)

## 2017-02-24 LAB — BRAIN NATRIURETIC PEPTIDE: B Natriuretic Peptide: 21 pg/mL (ref 0.0–100.0)

## 2017-02-24 LAB — T4, FREE: Free T4: 0.91 ng/dL (ref 0.61–1.12)

## 2017-02-24 LAB — TSH: TSH: 5.707 u[IU]/mL — AB (ref 0.350–4.500)

## 2017-02-24 SURGERY — LEFT HEART CATH AND CORONARY ANGIOGRAPHY
Anesthesia: Moderate Sedation

## 2017-02-24 MED ORDER — SODIUM CHLORIDE 0.9 % IV SOLN: 250.0000 mL | INTRAVENOUS | Status: DC | PRN

## 2017-02-24 MED ORDER — HEPARIN SODIUM (PORCINE) 1000 UNIT/ML IJ SOLN
INTRAMUSCULAR | Status: AC
  Filled 2017-02-24: qty 1

## 2017-02-24 MED ORDER — MIDAZOLAM HCL 2 MG/2ML IJ SOLN
INTRAMUSCULAR | Status: AC
  Filled 2017-02-24: qty 2

## 2017-02-24 MED ORDER — SODIUM CHLORIDE 0.9 % IV SOLN
INTRAVENOUS | Status: DC
  Administered 2017-02-24: 11:00:00 via INTRAVENOUS

## 2017-02-24 MED ORDER — VERAPAMIL HCL 2.5 MG/ML IV SOLN
INTRAVENOUS | Status: AC
  Filled 2017-02-24: qty 2

## 2017-02-24 MED ORDER — ATORVASTATIN CALCIUM 20 MG PO TABS: 20.0000 mg | ORAL_TABLET | Freq: Every day | ORAL | 3 refills | Status: DC

## 2017-02-24 MED ORDER — HEPARIN SODIUM (PORCINE) 1000 UNIT/ML IJ SOLN
INTRAMUSCULAR | Status: DC | PRN
  Administered 2017-02-24: 3500 [IU] via INTRAVENOUS

## 2017-02-24 MED ORDER — SODIUM CHLORIDE 0.9% FLUSH: 3.0000 mL | Freq: Two times a day (BID) | INTRAVENOUS | Status: DC

## 2017-02-24 MED ORDER — SODIUM CHLORIDE 0.9 % IV SOLN: INTRAVENOUS | Status: AC

## 2017-02-24 MED ORDER — METOPROLOL TARTRATE 25 MG PO TABS
12.5000 mg | ORAL_TABLET | Freq: Two times a day (BID) | ORAL | Status: DC
  Administered 2017-02-24: 12.5 mg via ORAL
  Filled 2017-02-24: qty 1

## 2017-02-24 MED ORDER — FENTANYL CITRATE (PF) 100 MCG/2ML IJ SOLN
INTRAMUSCULAR | Status: AC
  Filled 2017-02-24: qty 2

## 2017-02-24 MED ORDER — SODIUM CHLORIDE 0.9% FLUSH: 3.0000 mL | INTRAVENOUS | Status: DC | PRN

## 2017-02-24 MED ORDER — ATORVASTATIN CALCIUM 20 MG PO TABS: 20.0000 mg | ORAL_TABLET | Freq: Every day | ORAL | Status: DC

## 2017-02-24 MED ORDER — FENTANYL CITRATE (PF) 100 MCG/2ML IJ SOLN
INTRAMUSCULAR | Status: DC | PRN
Start: 2017-02-24 — End: 2017-02-24
  Administered 2017-02-24: 25 ug via INTRAVENOUS

## 2017-02-24 MED ORDER — ATORVASTATIN CALCIUM 20 MG PO TABS: 80.0000 mg | ORAL_TABLET | Freq: Every day | ORAL | Status: DC

## 2017-02-24 MED ORDER — ASPIRIN EC 81 MG PO TBEC: 81.0000 mg | DELAYED_RELEASE_TABLET | Freq: Every day | ORAL | 2 refills | Status: AC

## 2017-02-24 MED ORDER — MIDAZOLAM HCL 2 MG/2ML IJ SOLN
INTRAMUSCULAR | Status: DC | PRN
  Administered 2017-02-24 (×2): 1 mg via INTRAVENOUS

## 2017-02-24 MED ORDER — HEPARIN (PORCINE) IN NACL 2-0.9 UNIT/ML-% IJ SOLN
INTRAMUSCULAR | Status: AC
  Filled 2017-02-24: qty 500

## 2017-02-24 SURGICAL SUPPLY — 6 items
CATH OPTITORQUE JACKY 4.0 5F (CATHETERS) ×2 IMPLANT
DEVICE RAD TR BAND REGULAR (VASCULAR PRODUCTS) ×2 IMPLANT
GLIDESHEATH SLEND SS 6F .021 (SHEATH) ×2 IMPLANT
KIT MANI 3VAL PERCEP (MISCELLANEOUS) ×2 IMPLANT
PACK CARDIAC CATH (CUSTOM PROCEDURE TRAY) ×2 IMPLANT
WIRE ROSEN-J .035X260CM (WIRE) ×2 IMPLANT

## 2017-02-24 NOTE — Discharge Summary (Signed)
[facility] NAME: Michelle Armstrong    MR#:  [id number]  DATE OF BIRTH:  February 21, 1961  DATE OF ADMISSION:  02/23/2017 ADMITTING PHYSICIAN: Ubaldo Glassing Hugelmeyer, DO  DATE OF DISCHARGE: No discharge date for patient encounter.  PRIMARY CARE PHYSICIAN: Coral Spikes, DO     ADMISSION DIAGNOSIS:  Chest pain, unspecified type [R07.9]  DISCHARGE DIAGNOSIS:  Active Problems:   Demand ischemia (HCC)   Hypotension   Chest pain   Hyperlipidemia   Hypothyroidism   SECONDARY DIAGNOSIS:   Past Medical History:  Diagnosis Date  . HLD (hyperlipidemia)   . Hypothyroidism    possible h/o  . Obesity    a. s/p gastric bypass.  . 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 02/2010 Echo: EF 55-60%, no rwma.  . Vitamin D deficiency    low    .pro HOSPITAL COURSE:  Michelle Armstrong is a 56 y.o. female with a known history of HLD, hypothyroidism, SVT presents to the emergency department for evaluation of chest pain.  Patient was in a usual state of health until This afternoon when she describes sudden onset of midsternal chest pain while watching television. Patient is described as pressure that radiated to the left shoulder and arm was associated with shortness of breath,iiaphoresis, upper back pain, nausea, anxiety and dizziness. Patient denied fevers/chills, weakness,  N/V/C/D, abdominal pain, dysuria/frequency, changes in mental status. The pain was worse with deep inspirations. EKG was normal. Troponin was found to be slightly elevated on the second set to 0.13, third test 0.46, fourth 0.67. She was seen by cardiologist and underwent cardiac cath 02/24/17, revealing normal coronary arteries with no evidence of obstructive coronary artery disease. Mildly sluggish flow in the left coronary system especially the LAD.Normal LV systolic function by echo. Left ventricular angiography was not performed.  Normal left ventricular end-diastolic pressure. Recommendations: The patient might have endothelial dysfunction. Recommend medical therapy with aspirin and a statin. Given that some of her symptoms were pleuritic, d-dimer was checked and it was normal.  Discussion by problem: 1. Chest pain  With minimally elevated troponin, likely demand ischemia, etiology is unclear, s/p cardiac cat 02/24/17 by dr Fletcher Anon, sluggish flow in the left coronary system especially the LAD, recommended Asa, Lipitor, the patient refused, she is to follow up with DR Fletcher Anon to discuss recommendations. Ddimer was normal, no hypoxia on exertion on RA on ambulation 2. Hypotension, the patient reported low blood pressure at baseline, no interventions, ddimer was OK, no hypoxia  3 hyperlipidemia, start lipitor, the patient told us she woud not take it 4. Hypothyroidism, continue synthroid, TSH was high, but free T4 was OK   DISCHARGE CONDITIONS:   stable  CONSULTS OBTAINED:  Treatment Team:  Wellington Hampshire, MD  DRUG ALLERGIES:   Allergies  Allergen Reactions  . Cephalexin     Rash & dizziness  . Latex     rash  . Metoprolol     REACTION: dizziness, slurred speech  . Nsaids Other (See Comments)    ulcers  . Penicillin G Itching  . Pollen Extract     DISCHARGE MEDICATIONS:   Current Discharge Medication List    START taking these medications   Details  aspirin EC 81 MG tablet Take 1 tablet (81 mg total) by mouth daily. Qty: 30 tablet, Refills: 2    atorvastatin (LIPITOR) 20 MG tablet Take 1 tablet (20 mg  total) by mouth daily at 6 PM. Qty: 30 tablet, Refills: 3      CONTINUE these medications which have NOT CHANGED   Details  Calcium Carbonate 500 MG CHEW Chew 1 tablet by mouth daily.    Multiple Vitamins-Minerals (MULTIVITAMIN PO) Take 1 tablet by mouth daily.         DISCHARGE INSTRUCTIONS:    The patient is to follow up with PCP, cardiology as outpatient  If you experience worsening  of your admission symptoms, develop shortness of breath, life threatening emergency, suicidal or homicidal thoughts you must seek medical attention immediately by calling 911 or calling your MD immediately  if symptoms less severe.  You Must read complete instructions/literature along with all the possible adverse reactions/side effects for all the Medicines you take and that have been prescribed to you. Take any new Medicines after you have completely understood and accept all the possible adverse reactions/side effects.   Please note  You were cared for by a hospitalist during your hospital stay. If you have any questions about your discharge medications or the care you received while you were in the hospital after you are discharged, you can call the unit and asked to speak with the hospitalist on call if the hospitalist that took care of you is not available. Once you are discharged, your primary care physician will handle any further medical issues. Please note that NO REFILLS for any discharge medications will be authorized once you are discharged, as it is imperative that you return to your primary care physician (or establish a relationship with a primary care physician if you do not have one) for your aftercare needs so that they can reassess your need for medications and monitor your lab values.    Today   CHIEF COMPLAINT:   Chief Complaint  Patient presents with  . Chest Pain    HISTORY OF PRESENT ILLNESS:     VITAL SIGNS:  Blood pressure (!) 94/54, pulse 76, temperature 98.9 F (37.2 C), temperature source Oral, resp. rate 16, height 5\' 7"  (1.702 m), weight 74.4 kg (164 lb), SpO2 94 %.  I/O:    Intake/Output Summary (Last 24 hours) at 02/24/17 1803 Last data filed at 02/24/17 0948  Gross per 24 hour  Intake                0 ml  Output              650 ml  Net             -650 ml    PHYSICAL EXAMINATION:  GENERAL:  56 y.o.-year-old patient lying in the bed with no  acute distress.  EYES: Pupils equal, round, reactive to light and accommodation. No scleral icterus. Extraocular muscles intact.  HEENT: Head atraumatic, normocephalic. Oropharynx and nasopharynx clear.  NECK:  Supple, no jugular venous distention. No thyroid enlargement, no tenderness.  LUNGS: Normal breath sounds bilaterally, no wheezing, rales,rhonchi or crepitation. No use of accessory muscles of respiration.  CARDIOVASCULAR: S1, S2 normal. No murmurs, rubs, or gallops.  ABDOMEN: Soft, non-tender, non-distended. Bowel sounds present. No organomegaly or mass.  EXTREMITIES: No pedal edema, cyanosis, or clubbing.  NEUROLOGIC: Cranial nerves II through XII are intact. Muscle strength 5/5 in all extremities. Sensation intact. Gait not checked.  PSYCHIATRIC: The patient is alert and oriented x 3.  SKIN: No obvious rash, lesion, or ulcer.   DATA REVIEW:   CBC  Recent Labs Lab 02/24/17 0438  WBC 6.7  HGB 13.1  HCT 38.3  PLT 219    Chemistries   Recent Labs Lab 02/23/17 2325 02/24/17 0438  NA  --  141  K  --  4.0  CL  --  107  CO2  --  29  GLUCOSE  --  91  BUN  --  21*  CREATININE  --  0.54  CALCIUM  --  8.9  MG 2.1  --     Cardiac Enzymes  Recent Labs Lab 02/24/17 0438  TROPONINI 0.67*    Microbiology Results  No results found for this or any previous visit.  RADIOLOGY:  Dg Chest 2 View  Result Date: 02/23/2017 CLINICAL DATA:  Sudden onset midsternal chest pain today. Shortness of breath and dizziness. EXAM: CHEST  2 VIEW COMPARISON:  02/15/2015. FINDINGS: Normal sized heart. Clear lungs with normal vascularity. Unremarkable bones. Cholecystectomy clips. IMPRESSION: No acute abnormality. Electronically Signed   By: Claudie Revering M.D.   On: 02/23/2017 16:14    EKG:   Orders placed or performed during the hospital encounter of 02/23/17  . EKG 12-Lead  . EKG 12-Lead  . ED EKG within 10 minutes  . ED EKG within 10 minutes  . EKG 12-lead  . EKG 12-Lead  . EKG  12-Lead      Management plans discussed with the patient, family and they are in agreement.  CODE STATUS:     Code Status Orders        Start     Ordered   02/23/17 2250  Full code  Continuous     02/23/17 2249    Code Status History    Date Active Date Inactive Code Status Order ID Comments User Context   This patient has a current code status but no historical code status.      TOTAL TIME TAKING CARE OF THIS PATIENT: 40 minutes.    Theodoro Grist M.D on 02/24/2017 at 6:03 PM  Between 7am to 6pm - Pager - [phone]  After 6pm go to [email or web address] - password [email or web address] [facility]  [facility]  Office  [phone]  CC: Primary care physician; Coral Spikes, DO

## 2017-02-24 NOTE — Progress Notes (Signed)
Pt to be discharged this pm. Has ambulated at length and tol well.iv and tele removed. disch instructions given to pt and her husband. Discharged via w.c. To home accompanied by spouse

## 2017-02-24 NOTE — Progress Notes (Signed)
TR Band removed intact. No complications at right wrist site. Dressed with sterile 2x2 and tegaderm.

## 2017-02-24 NOTE — Progress Notes (Signed)
Patient has elevated troponin, no chest pain. Has Nonstemi, refused heparin drip or lovenox [place] for anticoagulation. RN aware of the situation.

## 2017-02-24 NOTE — Consult Note (Signed)
Cardiology Consult    Patient ID: JARETSSI KRAKER MRN: [id number], DOB/AGE: 04-03-1961   Admit date: 02/23/2017 Date of Consult: 02/24/2017  Primary Physician: Coral Spikes, DO Primary Cardiologist: Olin Pia, MD  Requesting Provider: A. Hugelmeyer, DO  Patient Profile    56 yo married Caucasian female with h/o HLD, hypothyroidism, & SVT s/p ablation (2011), who is being seen today after sudden onset of midsternal chest pain that radiated down the L arm at the request of [employer], DO.  Past Medical History   Past Medical History:  Diagnosis Date  . HLD (hyperlipidemia)   . Hypothyroidism    possible h/o  . Obesity    a. s/p gastric bypass.  . 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 02/2010 Echo: EF 55-60%, no rwma.  . Vitamin D deficiency    low    Past Surgical History:  Procedure Laterality Date  . ABDOMINAL HYSTERECTOMY  1999   endometriosis  . BARIATRIC SURGERY  12/2010  . COLONOSCOPY WITH PROPOFOL N/A 07/10/2015   Procedure: COLONOSCOPY WITH PROPOFOL;  Surgeon: Lollie Sails, MD;  Location: [facility] ENDOSCOPY;  Service: Endoscopy;  Laterality: N/A;  . GALLBLADDER SURGERY    . HERNIA REPAIR  12/2010     Allergies  Allergies  Allergen Reactions  . Cephalexin     Rash & dizziness  . Latex     rash  . Metoprolol     REACTION: dizziness, slurred speech  . Nsaids Other (See Comments)    ulcers  . Penicillin G Itching  . Pollen Extract     History of Present Illness    55 yo married Caucasian female with h/o HLD, hypothyroidism, & SVT s/p ablation (2011).  She was last seen in cardiology clinic In August 2016. She reported 2 episodes of palpitations at that time, but has not had any since. She has been out of work for the past year related to an injury/disability but has not had any significant cardiac issues. She was in her usual state of health until June 10, when she was sitting watching TV and had sudden onset  of substernal chest heaviness with radiation down her left arm. Should this was associated with dyspnea. Once pain went down her left arm, she alerted her husband and he drove her to the emergency department. Here, ECG was nonacute. Initial troponin was normal at less than 0.3, however subsequent troponins have risen to a peak of 0.67. She has not had any further chest discomfort since admission. Of note, pain was worse with deep breathing while in the emergency department.  Patient is anxious regarding any medications that "thin the blood" and has already declined heparin once since her admission.  Inpatient Medications    . aspirin EC  81 mg Oral Daily  . atorvastatin  80 mg Oral q1800  . metoprolol tartrate  12.5 mg Oral BID  . sodium chloride flush  3 mL Intravenous Q12H    Family History    Family History  Problem Relation Age of Onset  . Heart disease Father        s/p CABG  . Obesity Father   . Hyperlipidemia Father   . Thyroid disease Sister     Social History    Social History   Social History  . Marital status: Married    Spouse name: N/A  . Number of children: 1  . Years of education: N/A   Occupational  History  .  Lab [employer]   Social History Main Topics  . Smoking status: Never Smoker  . Smokeless tobacco: Never Used     Comment: tobacco use- no   . Alcohol use 0.0 oz/week     Comment: rare glass of wine.  . Drug use: No  . Sexual activity: Not on file   Other Topics Concern  . Not on file   Social History Narrative   Lives locally with husband.  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     Review of Systems    General:  No chills, fever, night sweats or weight changes.  Cardiovascular:  +++ chest pain w/ associated dyspnea, no edema, orthopnea, palpitations, paroxysmal nocturnal dyspnea. Dermatological: No rash, lesions/masses Respiratory: No cough, +++ dyspnea Urologic: No hematuria, dysuria Abdominal:   No nausea, vomiting, diarrhea,  bright red blood per rectum, melena, or hematemesis Neurologic:  No visual changes, wkns, changes in mental status. All other systems reviewed and are otherwise negative except as noted above.  Physical Exam    Blood pressure 92/65, pulse 75, temperature 98.2 F (36.8 C), temperature source Oral, resp. rate 14, height 5\' 7"  (1.702 m), weight 164 lb (74.4 kg), SpO2 96 %.  General: Pleasant, NAD Psych: Normal affect. Neuro: Alert and oriented X 3. Moves all extremities spontaneously. HEENT: Normal  Neck: Supple without bruits or JVD. Lungs:  Resp regular and unlabored, CTA. Heart: RRR no s3, s4, or murmurs. Abdomen: Soft, non-tender, non-distended, BS + x 4.  Extremities: No clubbing, cyanosis or edema. DP/PT/Radials 2+ and equal bilaterally.  Labs     Recent Labs  02/23/17 1529 02/23/17 2022 02/23/17 2325 02/24/17 0438  TROPONINI <0.03 0.13* 0.46* 0.67*   Lab Results  Component Value Date   WBC 6.7 02/24/2017   HGB 13.1 02/24/2017   HCT 38.3 02/24/2017   MCV 85.5 02/24/2017   PLT 219 02/24/2017    Recent Labs Lab 02/24/17 0438  NA 141  K 4.0  CL 107  CO2 29  BUN 21*  CREATININE 0.54  CALCIUM 8.9  GLUCOSE 91   Lab Results  Component Value Date   CHOL 209 (H) 02/24/2017   HDL 91 02/24/2017   LDLCALC 110 (H) 02/24/2017   TRIG 41 02/24/2017   Lab Results  Component Value Date   DDIMER 0.32 12/27/2015     Radiology Studies    Dg Chest 2 View  Result Date: 02/23/2017 CLINICAL DATA:  Sudden onset midsternal chest pain today. Shortness of breath and dizziness. EXAM: CHEST  2 VIEW COMPARISON:  02/15/2015. FINDINGS: Normal sized heart. Clear lungs with normal vascularity. Unremarkable bones. Cholecystectomy clips. IMPRESSION: No acute abnormality. Electronically Signed   By: Claudie Revering M.D.   On: 02/23/2017 16:14    ECG & Cardiac Imaging    Regular sinus rhythm, 74, septal T-wave inversion  Assessment & Plan    1. Non-ST segment elevation myocardial  infarction: Patient presented after an episode of chest discomfort with radiation to the left arm associated with dyspnea. Symptoms resolved after several hours. Troponin rose to 0.67. There was initially a pleuritic component to her chest pain. Chest pain has not recurred. Given symptoms and troponin rise, we will plan on diagnostic cardiac catheterization.  The patient understands that risks include but are not limited to stroke (1 in 1000), death (1 in 10), kidney failure [usually temporary] (1 in 500), bleeding (1 in 200), allergic reaction [possibly serious] (1 in 200), and agrees to proceed.  Continue aspirin, beta blocker, and statin.  Signed, Murray Hodgkins, NP 02/24/2017, 2:48 PM

## 2017-02-24 NOTE — Telephone Encounter (Signed)
Erica from [facility] called and needed to schedule a HFU for 1 week for pt. Pt was in for non stemi. Pt has been scheduled for 6/15 @ 11:15.

## 2017-02-24 NOTE — Progress Notes (Signed)
Troponin level 0.67. MD Pyreddy made aware. No c/o chest pain or discomfort.

## 2017-02-24 NOTE — Care Management (Signed)
Patient admitted for nstemi. Cardiology consult is pending.    Patient presents from home and independent in all adls.  No issues accessing medical care, obtaining medications, maintaining housing, utilities and food.   No discharge needs identified at present time.

## 2017-02-24 NOTE — Progress Notes (Signed)
Pt refused metoprolol and Lipitor. Pt states she's allergic to metoprolol and that she would take the Lipitor at the next dose. MD aware. Will continue to monitor.

## 2017-02-24 NOTE — Progress Notes (Signed)
*  PRELIMINARY RESULTS* Echocardiogram 2D Echocardiogram has been performed.  Michelle Armstrong 02/24/2017, 10:10 AM

## 2017-02-25 ENCOUNTER — Encounter: Payer: Self-pay | Admitting: Cardiovascular Disease

## 2017-02-25 ENCOUNTER — Telehealth: Payer: Self-pay | Admitting: Family Medicine

## 2017-02-25 ENCOUNTER — Ambulatory Visit
Admission: RE | Admit: 2017-02-25 | Discharge: 2017-02-25 | Disposition: A | Discharge status: Home / Self Care | Payer: 59 | Source: Ambulatory Visit | Attending: Obstetrics and Gynecology | Admitting: Obstetrics and Gynecology

## 2017-02-25 DIAGNOSIS — Z1231 Encounter for screening mammogram for malignant neoplasm of breast: Secondary | ICD-10-CM | POA: Diagnosis present

## 2017-02-25 LAB — HIV ANTIBODY (ROUTINE TESTING W REFLEX): HIV Screen 4th Generation wRfx: NONREACTIVE

## 2017-02-25 LAB — HEMOGLOBIN A1C
HEMOGLOBIN A1C: 5.3 % (ref 4.8–5.6)
MEAN PLASMA GLUCOSE: 105 mg/dL

## 2017-02-25 NOTE — Telephone Encounter (Signed)
Transition Care Management Follow-up Telephone Call  How have you been since you were released from the hospital? Patient has no more chest pain but still has nausea , dizziness. Patient stated she cannot keep the HFU because she has to return to work.   Do you understand why you were in the hospital? Yes,    Do you understand the discharge instrcutions? Yes  Items Reviewed:  Medications reviewed: yes  Allergies reviewed:yes  Dietary changes reviewed: Yes  Referrals reviewed: yes,   Functional Questionnaire:   Activities of Daily Living (ADLs):   She states they are independent in the following: in all ADls. States they require assistance with the following:No assist needed.   Any transportation issues/concerns?: no   Any patient concerns? Yes, patient still feels dizzy and nauseated, and is concerned about when she should return to work. Patient had Cath 02/14/17 wonder how much she can lift at work  Sine entry was at wrist?   Confirmed importance and date/time of follow-up visits scheduled: Yes patient asking for earlier appointment or advice as to when she should return to work.   Confirmed with patient if condition begins to worsen call PCP or go to the ER.  Patient was given the Call-a-Nurse line [phone]: Yes.

## 2017-02-25 NOTE — Telephone Encounter (Signed)
PT has a hospital follow with Dr. Lacinda Axon on Friday. She is requesting that the appt be made today or tomorrow, she is off work. Please advise, [phone], can leave a message.

## 2017-02-26 ENCOUNTER — Inpatient Hospital Stay
Admission: RE | Admit: 2017-02-26 | Discharge: 2017-02-26 | Disposition: A | Discharge status: Home / Self Care | Payer: Self-pay | Source: Ambulatory Visit | Attending: *Deleted | Admitting: *Deleted

## 2017-02-26 ENCOUNTER — Other Ambulatory Visit: Payer: Self-pay | Admitting: *Deleted

## 2017-02-26 DIAGNOSIS — Z9289 Personal history of other medical treatment: Secondary | ICD-10-CM

## 2017-02-26 NOTE — Telephone Encounter (Signed)
Disregard. Found opening for Monday at 8 am. Pt aware.

## 2017-02-26 NOTE — Telephone Encounter (Signed)
PT called and cancelled her appt with Dr. Lacinda Axon for the [facility] because she needs to go back to work. Pt asked to have appt scheduled for first thing next week. Pt stated that if she does not pick up that you can leave it on her voicemail. Please advise, thank you!  Call pt @ [phone]

## 2017-02-27 ENCOUNTER — Encounter: Payer: Self-pay | Admitting: Obstetrics and Gynecology

## 2017-02-28 ENCOUNTER — Ambulatory Visit: Payer: 59 | Admitting: Family Medicine

## 2017-03-03 ENCOUNTER — Encounter: Payer: Self-pay | Admitting: Family Medicine

## 2017-03-03 ENCOUNTER — Ambulatory Visit (INDEPENDENT_AMBULATORY_CARE_PROVIDER_SITE_OTHER): Payer: 59 | Admitting: Family Medicine

## 2017-03-03 VITALS — BP 120/70 | HR 78 | Temp 98.4°F | Ht 67.0 in | Wt 152.0 lb

## 2017-03-03 DIAGNOSIS — R760 Raised antibody titer: Secondary | ICD-10-CM | POA: Diagnosis not present

## 2017-03-03 DIAGNOSIS — R790 Abnormal level of blood mineral: Secondary | ICD-10-CM

## 2017-03-03 DIAGNOSIS — R079 Chest pain, unspecified: Secondary | ICD-10-CM | POA: Diagnosis not present

## 2017-03-03 DIAGNOSIS — E038 Other specified hypothyroidism: Secondary | ICD-10-CM

## 2017-03-03 DIAGNOSIS — R7989 Other specified abnormal findings of blood chemistry: Secondary | ICD-10-CM

## 2017-03-03 DIAGNOSIS — E785 Hyperlipidemia, unspecified: Secondary | ICD-10-CM

## 2017-03-03 DIAGNOSIS — E039 Hypothyroidism, unspecified: Secondary | ICD-10-CM

## 2017-03-03 DIAGNOSIS — R748 Abnormal levels of other serum enzymes: Secondary | ICD-10-CM | POA: Diagnosis not present

## 2017-03-03 DIAGNOSIS — R778 Other specified abnormalities of plasma proteins: Secondary | ICD-10-CM

## 2017-03-03 NOTE — Progress Notes (Signed)
Subjective:  Patient ID: Michelle Armstrong, female    DOB: 11/16/1960  Age: 56 y.o. MRN: [id number]  CC: Hospital follow up   HPI:  56 year old female with a history of SVT, hypothyroidism, hyperlipidemia presents for hospital follow up.  Patient presented on 6/10 with midsternal chest pain. EKG normal. Troponin was elevated and then continued to rise. Fourth troponin at 0.67. Seen by cardiology and underwent cardiac catheterization. Catheterization revealed normal coronary arteries. Cardiologist at comment about sluggish flow in the left coronary system. Normal LV function by echo. It was recommended patient start aspirin and Lipitor. She refused to start Lipitor. Remainder of her hospitalization was unremarkable. The etiology of her chest pain is unclear.  Patient presents today for follow-up. She's had no further chest pain. She has not taken the Lipitor. She refuses. She is taking aspirin 81 mg daily. Patient states that she is doing well. She has no complaints (other than an ongoing Workman's comp case). Patient requesting repeat in blood work related to prior abnormal blood work done by her GYN. Also requesting additional blood work.   In regards to her hyperlipidemia, this is uncontrolled. She has refused statin treatment. She feels that her elevated HDL outweighs the risk.  She is not currently on treatment for hypothyroidism. Most recent TSH was elevated with normal free T4. Will discuss today.  Social Hx   Social History   Social History  . Marital status: Married    Spouse name: N/A  . Number of children: 1  . Years of education: N/A   Occupational History  .  Lab [employer]   Social History Main Topics  . Smoking status: Never Smoker  . Smokeless tobacco: Never Used     Comment: tobacco use- no   . Alcohol use 0.0 oz/week     Comment: rare glass of wine.  . Drug use: No  . Sexual activity: Not Asked   Other Topics Concern  . None   Social History Narrative   Lives locally with husband.  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    Review of Systems  Constitutional: Negative.   Respiratory: Negative.   Cardiovascular: Negative for chest pain.   Objective:  BP 120/70 (BP Location: Left Arm, Patient Position: Sitting, Cuff Size: Normal)   Pulse 78   Temp 98.4 F (36.9 C) (Oral)   Ht 5\' 7"  (1.702 m)   Wt 152 lb (68.9 kg) Comment: per pt refusted to get on scal  SpO2 98%   BMI 23.81 kg/m   BP/Weight 03/03/2017 02/24/2017 03/17/6377  Systolic BP 588 94 502  Diastolic BP 70 54 75  Wt. (Lbs) 152 164 -  BMI 23.81 25.69 -    Physical Exam  Constitutional: She is oriented to person, place, and time. She appears well-developed. No distress.  Cardiovascular: Normal rate and regular rhythm.   Pulmonary/Chest: Effort normal and breath sounds normal. She has no wheezes. She has no rales.  Neurological: She is alert and oriented to person, place, and time.  Psychiatric: She has a normal mood and affect.  Vitals reviewed.   Lab Results  Component Value Date   WBC 6.7 02/24/2017   HGB 13.1 02/24/2017   HCT 38.3 02/24/2017   PLT 219 02/24/2017   GLUCOSE 91 02/24/2017   CHOL 209 (H) 02/24/2017   TRIG 41 02/24/2017   HDL 91 02/24/2017   LDLCALC 110 (H) 02/24/2017   ALT 24 01/24/2017   AST 29 01/24/2017  NA 141 02/24/2017   K 4.0 02/24/2017   CL 107 02/24/2017   CREATININE 0.54 02/24/2017   BUN 21 (H) 02/24/2017   CO2 29 02/24/2017   TSH 5.707 (H) 02/23/2017   INR 1.10 02/24/2017   HGBA1C 5.3 02/23/2017    Assessment & Plan:   Problem List Items Addressed This Visit    Subclinical hypothyroidism    Most recent labs consistent with subclinical hyperthyroidism. Discussed treating versus not treating. Patient will contemplate.      Hyperlipidemia    LDL not at goal. Patient refusing statin.      Chest pain - Primary    New problem. Hospital course reviewed and summarized in history of present illness. No additional  bouts of chest pain. Unsure of the cause at this point in time. Will discuss with cardiology. Advised to continue aspirin 81 mg daily. Patient does not want statin.       Other Visit Diagnoses    Elevated troponin       Relevant Orders   Troponin I   Low ferritin       Relevant Orders   Ferritin   Elevated antibody levels       Relevant Orders   ABO AND RH    Cardiolipin antibodies, IgG, IgM, IgA     Follow-up: 6 months  [place] DO [facility]

## 2017-03-03 NOTE — Assessment & Plan Note (Signed)
New problem. Hospital course reviewed and summarized in history of present illness. No additional bouts of chest pain. Unsure of the cause at this point in time. Will discuss with cardiology. Advised to continue aspirin 81 mg daily. Patient does not want statin.

## 2017-03-03 NOTE — Assessment & Plan Note (Signed)
Most recent labs consistent with subclinical hyperthyroidism. Discussed treating versus not treating. Patient will contemplate.

## 2017-03-03 NOTE — Patient Instructions (Signed)
Aspirin daily.  I will discuss with Arida.  Follow up in 6 months.  Take care  Dr. Lacinda Axon

## 2017-03-03 NOTE — Assessment & Plan Note (Signed)
LDL not at goal. Patient refusing statin.

## 2017-03-03 NOTE — Progress Notes (Signed)
Pre-visit discussion using our clinic review tool. No additional management support is needed unless otherwise documented below in the visit note.  

## 2017-03-04 LAB — ABO AND RH: Rh Factor: POSITIVE

## 2017-03-05 ENCOUNTER — Other Ambulatory Visit: Payer: Self-pay | Admitting: Family Medicine

## 2017-03-05 LAB — CARDIOLIPIN ANTIBODIES, IGG, IGM, IGA
Anticardiolipin IgA: <9 APL U/mL (ref 0–11)
Anticardiolipin IgM: <9 MPL U/mL (ref 0–12)

## 2017-03-05 LAB — TROPONIN I: TROPONIN I: 0.01 ng/mL (ref 0.00–0.04)

## 2017-03-05 LAB — FERRITIN: Ferritin: 16 ng/mL (ref 15–150)

## 2017-03-05 MED ORDER — LEVOTHYROXINE SODIUM 25 MCG PO TABS: 25.0000 ug | ORAL_TABLET | Freq: Every day | ORAL | 1 refills | Status: DC

## 2017-03-07 ENCOUNTER — Telehealth: Payer: Self-pay | Admitting: *Deleted

## 2017-03-07 NOTE — Telephone Encounter (Signed)
Noted, thanks!

## 2017-03-07 NOTE — Telephone Encounter (Signed)
Patient requested lab results  Pt Contact: [phone] A detail message can be left on voicemail per-patient

## 2017-03-07 NOTE — Telephone Encounter (Signed)
Do you have any new labs on her? I saw you mailed the results on the 20th to her for the ones on the 18th?

## 2017-03-07 NOTE — Telephone Encounter (Signed)
Called patient went over results of labs. And let her know that we have mailed script to her home number.

## 2017-03-07 NOTE — Telephone Encounter (Signed)
Patient requested to not have any medical record release to workers comp.  Pt contact [phone]

## 2017-03-07 NOTE — Telephone Encounter (Signed)
Left message to return call to our office.  

## 2017-03-14 ENCOUNTER — Encounter: Payer: Self-pay | Admitting: Nurse Practitioner

## 2017-03-24 ENCOUNTER — Telehealth: Payer: Self-pay | Admitting: Obstetrics and Gynecology

## 2017-03-24 NOTE — Telephone Encounter (Signed)
Patient is asking if something could be removed from [place], states Elmo Putt would have to be the one to remove. If [facility] could call patient.  She states she is at lunch from 2:30 to 3:00.

## 2017-03-26 NOTE — Telephone Encounter (Signed)
LM for pt to let me know how she wants to get form, I.e. Pick up at front desk, mail, fax.

## 2017-03-26 NOTE — Telephone Encounter (Signed)
Pt is returning call from [place]. Pt says you may leave a detailed message or she is going to lunch at 2:30.

## 2017-03-26 NOTE — Telephone Encounter (Signed)
03/24/17 5:00 PM: Spoke with pt re: health hx. She needs past surg info removed from her [facility] chart due to workman's comp case and their intrusiveness into her health record.  12/18/57 Spoke with Solmon Ice. Form requirements for documentation received from [facility] later in the day.  03/26/17 LM for pt that form is ready for her completion.

## 2017-04-01 NOTE — Telephone Encounter (Signed)
03/28/17 Pt came by office to complete form.

## 2017-04-10 ENCOUNTER — Ambulatory Visit: Payer: 59 | Admitting: Obstetrics and Gynecology

## 2017-04-14 ENCOUNTER — Ambulatory Visit (INDEPENDENT_AMBULATORY_CARE_PROVIDER_SITE_OTHER): Payer: 59 | Admitting: Obstetrics and Gynecology

## 2017-04-14 ENCOUNTER — Encounter: Payer: Self-pay | Admitting: Obstetrics and Gynecology

## 2017-04-14 VITALS — BP 110/70 | HR 82 | Ht 67.0 in

## 2017-04-14 DIAGNOSIS — Z01419 Encounter for gynecological examination (general) (routine) without abnormal findings: Secondary | ICD-10-CM

## 2017-04-14 DIAGNOSIS — N760 Acute vaginitis: Secondary | ICD-10-CM | POA: Diagnosis not present

## 2017-04-14 DIAGNOSIS — Z124 Encounter for screening for malignant neoplasm of cervix: Secondary | ICD-10-CM | POA: Diagnosis not present

## 2017-04-14 DIAGNOSIS — M8588 Other specified disorders of bone density and structure, other site: Secondary | ICD-10-CM

## 2017-04-14 NOTE — Progress Notes (Signed)
Chief Complaint  Patient presents with  . Gynecologic Exam    HPI:      [name] is a 56 y.o. G2P0011 who LMP was No LMP recorded. Patient has had a hysterectomy., presents today for her annual examination.  Her menses are absent due to hyst. She does not have intermenstrual bleeding.  Sex activity: single partner, contraception - status post hysterectomy. She does have vaginal dryness, improved with ThermaVi procedure. She uses lubricants with some relief. She has had a couple bumps vaginally recently. Tender to touch, but no pain. 1 lesion resolved after a few days. Pt uses oil of olay soap/dryer sheets.  Last Pap: Feb 14, 2015  Results were: no abnormalities /neg HPV DNA. Neg plain pap 02/22/16. Pt likes yearly paps. Hx of STDs: none  Last mammogram: 02/25/17  Results were: normal--routine follow-up in 12 months There is no FH of breast cancer. There is no FH of ovarian cancer. The patient does do self-breast exams.  Colonoscopy: colonoscopy 2 years ago without abnormalities. Repeat due in 5 yrs.  DEXA: 2016. Osteopenia in spine and hip. Due for repeat.  Tobacco use: The patient denies current or previous tobacco use. Alcohol use: none Exercise: moderately active  She does get adequate calcium and Vitamin D in her diet. Recent labs WNL. Pt taking MVI with iron.   Past Medical History:  Diagnosis Date  . HLD (hyperlipidemia)   . Hypothyroidism    possible h/o  . Osteopenia 2016   spine/hip; DEXA at [facility]  . 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 02/2010 Echo: EF 55-60%, no rwma.  . Vitamin D deficiency    low    Past Surgical History:  Procedure Laterality Date  . ABDOMINAL HYSTERECTOMY  1999   Total lap hyst BSO due to endometriosis  . COLONOSCOPY WITH PROPOFOL N/A 07/10/2015   Procedure: COLONOSCOPY WITH PROPOFOL;  Surgeon: Lollie Sails, MD;  Location: [facility] ENDOSCOPY;  Service: Endoscopy;  Laterality: N/A;  .  GALLBLADDER SURGERY    . HERNIA REPAIR  12/2010  . LEFT HEART CATH AND CORONARY ANGIOGRAPHY N/A 02/24/2017   Procedure: Left Heart Cath and Coronary Angiography;  Surgeon: Wellington Hampshire, MD;  Location: [facility];  Service: Cardiovascular;  Laterality: N/A;    Family History  Problem Relation Age of Onset  . Heart disease Father        s/p CABG  . Obesity Father   . Hyperlipidemia Father   . Thyroid disease Sister   . Breast cancer Neg Hx     Social History   Social History  . Marital status: Married    Spouse name: N/A  . Number of children: 1  . Years of education: N/A   Occupational History  .  Lab [employer]   Social History Main Topics  . Smoking status: Never Smoker  . Smokeless tobacco: Never Used     Comment: tobacco use- no   . Alcohol use No     Comment: rare glass of wine.  . Drug use: No  . Sexual activity: Yes   Other Topics Concern  . Not on file   Social History Narrative   Lives locally with husband.  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     Current Outpatient Prescriptions:  .  aspirin EC 81 MG tablet, Take 1 tablet (81 mg total) by mouth daily., Disp: 30 tablet, Rfl: 2 .  Calcium Carbonate 500 MG CHEW, Chew 1 tablet by mouth daily., Disp: , Rfl:  .  levothyroxine (SYNTHROID) 25 MCG tablet, Take 1 tablet (25 mcg total) by mouth daily before breakfast., Disp: 90 tablet, Rfl: 1 .  Multiple Vitamins-Minerals (MULTIVITAMIN PO), Take 1 tablet by mouth daily., Disp: , Rfl:    ROS:  Review of Systems  Constitutional: Negative for fatigue, fever and unexpected weight change.  Respiratory: Negative for cough, shortness of breath and wheezing.   Cardiovascular: Negative for chest pain, palpitations and leg swelling.  Gastrointestinal: Negative for blood in stool, constipation, diarrhea, nausea and vomiting.  Endocrine: Negative for cold intolerance, heat intolerance and polyuria.  Genitourinary: Negative for dyspareunia, dysuria, flank  pain, frequency, genital sores, hematuria, menstrual problem, pelvic pain, urgency, vaginal bleeding, vaginal discharge and vaginal pain.  Musculoskeletal: Negative for back pain, joint swelling and myalgias.  Skin: Negative for rash.  Neurological: Negative for dizziness, syncope, light-headedness, numbness and headaches.  Hematological: Negative for adenopathy.  Psychiatric/Behavioral: Negative for agitation, confusion, sleep disturbance and suicidal ideas. The patient is not nervous/anxious.      Objective: BP 110/70   Pulse 82   Ht 5\' 7"  (1.702 m)    Physical Exam  Constitutional: She is oriented to person, place, and time. She appears well-developed and well-nourished.  Genitourinary: Vagina normal. There is no rash or tenderness on the right labia. There is no rash or tenderness on the left labia. No erythema or tenderness in the vagina. No vaginal discharge found. Right adnexum does not display mass and does not display tenderness. Left adnexum does not display mass and does not display tenderness.  Genitourinary Comments: UTERUS/CX SURG REM; BLOCKED GLAND RT LABIA MAJORA WITH WHITE "HEAD" IN CENTER; NO ERYTHEMA/PAIN MILD TINEA BILAT BUTTOCKS NEAR RECTAL AREA  Neck: Normal range of motion. No thyromegaly present.  Cardiovascular: Normal rate, regular rhythm and normal heart sounds.   No murmur heard. Pulmonary/Chest: Effort normal and breath sounds normal. Right breast exhibits no mass, no nipple discharge, no skin change and no tenderness. Left breast exhibits no mass, no nipple discharge, no skin change and no tenderness.  Abdominal: Soft. There is no tenderness. There is no guarding.  Musculoskeletal: Normal range of motion.  Neurological: She is alert and oriented to person, place, and time. No cranial nerve deficit.  Psychiatric: She has a normal mood and affect. Her behavior is normal.  Vitals reviewed.   Assessment/Plan:  Encounter for annual routine gynecological  examination  Cervical cancer screening - Plain pap this yr. - Plan: Pap IG (Image Guided)  Acute vaginitis - Small blocked gland vaginally. Warm compress. Reassurance. Mild tinea ext. Treat with OTC lotrimin/keep dry.  Osteopenia of lumbar spine - DEXA due. Will f/u with results.  - Plan: DG Bone Density          GYN counsel mammography screening, osteoporosis, adequate intake of calcium and vitamin D, diet and exercise    F/U  Return in about 1 year (around 04/14/2018).  Alicia B. Copland, PA-C 04/14/2017 8:42 PM

## 2017-04-16 LAB — PAP IG (IMAGE GUIDED): PAP Smear Comment: 0

## 2017-05-09 ENCOUNTER — Telehealth: Payer: Self-pay | Admitting: *Deleted

## 2017-05-09 NOTE — Telephone Encounter (Signed)
Pt has requested to have a script mailed to her for future orders for her tyroid check([employer] employee)  Prior to Cowley leaving the patient also questioned if she should be concerned with clear ridges starting at her finger nail beds to the tips.  Pt contact [phone]

## 2017-05-12 ENCOUNTER — Telehealth: Payer: Self-pay | Admitting: Cardiovascular Disease

## 2017-05-12 NOTE — Telephone Encounter (Signed)
Patient aware you are out of the office this week.

## 2017-05-12 NOTE — Telephone Encounter (Signed)
Pt called back with another question. Pt states that Dr. Lacinda Axon was going to check with Cardiologist while she was in the hospital in regards to sluggish veins. Please advise, thank you!  Call pt @ [phone]

## 2017-05-12 NOTE — Telephone Encounter (Signed)
Pt calling stating in June she was in ED and Dr Fletcher Anon came and gave her an evaluation  She is not remember a lot right now Pt is asking if we can please leave a detailed message so she doesn't have to call back  Would like a call back with his instructions

## 2017-05-12 NOTE — Telephone Encounter (Signed)
S/w patient. Reviewed procedural report of heart cath with patient. She requested a copy be mailed to her. Quick disclosure filled out in EPIC and mailed to patient.

## 2017-05-20 ENCOUNTER — Telehealth: Payer: Self-pay | Admitting: Family Medicine

## 2017-05-20 ENCOUNTER — Other Ambulatory Visit: Payer: Self-pay | Admitting: Family Medicine

## 2017-05-20 DIAGNOSIS — E039 Hypothyroidism, unspecified: Secondary | ICD-10-CM

## 2017-05-20 DIAGNOSIS — E038 Other specified hypothyroidism: Secondary | ICD-10-CM

## 2017-05-20 NOTE — Telephone Encounter (Signed)
Encounter to place lab orders

## 2017-05-20 NOTE — Telephone Encounter (Signed)
Patient advised of below and verbalized understanding.  

## 2017-05-20 NOTE — Telephone Encounter (Signed)
Left voice mail to call back, lab forms placed up front for pick up

## 2017-05-20 NOTE — Telephone Encounter (Signed)
I am not sure about the "ridges" she is referring to. She can pick up lab form. I spoke with cardiology. He stated this was from microvascular disease or endothelial dysfunction. No need to worry. Just aggressive control of risk factors.

## 2017-05-22 ENCOUNTER — Encounter: Payer: Self-pay | Admitting: Family Medicine

## 2017-05-22 ENCOUNTER — Ambulatory Visit (INDEPENDENT_AMBULATORY_CARE_PROVIDER_SITE_OTHER): Payer: 59 | Admitting: Family Medicine

## 2017-05-22 ENCOUNTER — Ambulatory Visit: Payer: 59 | Admitting: Family Medicine

## 2017-05-22 VITALS — BP 112/70 | HR 83 | Temp 98.4°F

## 2017-05-22 DIAGNOSIS — Z1159 Encounter for screening for other viral diseases: Secondary | ICD-10-CM | POA: Insufficient documentation

## 2017-05-22 DIAGNOSIS — Q846 Other congenital malformations of nails: Secondary | ICD-10-CM

## 2017-05-22 HISTORY — DX: Other congenital malformations of nails: Q84.6

## 2017-05-22 NOTE — Patient Instructions (Signed)
May be from your Thyroid issue.  Keep an eye on it. Continue Multivitamin. Nothing to be worried about that I can see.  Take care  Dr. Lacinda Axon

## 2017-05-22 NOTE — Assessment & Plan Note (Signed)
Patient meets age criteria for screening. Hepatitis C screening today.

## 2017-05-22 NOTE — Assessment & Plan Note (Signed)
New problem.  Uncertain etiology.  Does not appear to be onychorrhexis. Nails are not brittle. No ridges, Beau's lines. No color change.  Clinically I don't feel that there is anything to be concerned about. Possibly related to hypothyroidism. Advised close monitoring.

## 2017-05-22 NOTE — Progress Notes (Signed)
Subjective:  Patient ID: Michelle Armstrong, female    DOB: [birth year]  Age: 56 y.o. MRN: [id number]  CC: Lines in nails, Wants Hep C testing  HPI:  56 year old female presents with the above complaints.  Patient reports that for the last year she's had "lines" in her nails. Her nails not brittle. She takes a multivitamin daily. No color change. No known inciting factor. She is quite concerned about this and has been on the Internet looking up potential etiologies. No known exacerbating factors. No other associated symptoms.  Additionally, patient states that she has seen some as on TV about hepatitis C screening and would like to be screened today.  Social Hx   Social History   Social History  . Marital status: Married    Spouse name: N/A  . Number of children: 1  . Years of education: N/A   Occupational History  .  Lab [employer]   Social History Main Topics  . Smoking status: Never Smoker  . Smokeless tobacco: Never Used     Comment: tobacco use- no   . Alcohol use No     Comment: rare glass of wine.  . Drug use: No  . Sexual activity: Yes   Other Topics Concern  . None   Social History Narrative   Lives locally with husband.  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    Review of Systems  Constitutional: Negative.   Skin:       Nail changes.    Objective:  BP 112/70 (BP Location: Left Arm, Patient Position: Sitting, Cuff Size: Normal)   Pulse 83   Temp 98.4 F (36.9 C) (Oral)   SpO2 98%   BP/Weight 05/22/2017 04/14/2017 3/53/6144  Systolic BP 315 400 867  Diastolic BP 70 70 70  Wt. (Lbs) - - 152  BMI - - 23.81    Physical Exam  Constitutional: She is oriented to person, place, and time. She appears well-developed. No distress.  Cardiovascular: Normal rate and regular rhythm.   Pulmonary/Chest: Effort normal. She has no wheezes. She has no rales.  Neurological: She is alert and oriented to person, place, and time.  Skin:  Patient has several nails  with vertical lines. No color change. Nails are full and do not appear brittle.  Psychiatric: She has a normal mood and affect.  Vitals reviewed.   Lab Results  Component Value Date   WBC 6.7 02/24/2017   HGB 13.1 02/24/2017   HCT 38.3 02/24/2017   PLT 219 02/24/2017   GLUCOSE 91 02/24/2017   CHOL 209 (H) 02/24/2017   TRIG 41 02/24/2017   HDL 91 02/24/2017   LDLCALC 110 (H) 02/24/2017   ALT 24 01/24/2017   AST 29 01/24/2017   NA 141 02/24/2017   K 4.0 02/24/2017   CL 107 02/24/2017   CREATININE 0.54 02/24/2017   BUN 21 (H) 02/24/2017   CO2 29 02/24/2017   TSH 5.707 (H) 02/23/2017   INR 1.10 02/24/2017   HGBA1C 5.3 02/23/2017    Assessment & Plan:   Problem List Items Addressed This Visit    Anomalies of nails - Primary    New problem.  Uncertain etiology.  Does not appear to be onychorrhexis. Nails are not brittle. No ridges, Beau's lines. No color change.  Clinically I don't feel that there is anything to be concerned about. Possibly related to hypothyroidism. Advised close monitoring.      Need for hepatitis C screening test  Patient meets age criteria for screening. Hepatitis C screening today.      Relevant Orders   Hepatitis C Antibody     Follow-up: PRN  [place]

## 2017-05-23 LAB — HEPATITIS C ANTIBODY

## 2017-05-30 ENCOUNTER — Ambulatory Visit: Payer: 59 | Admitting: Family Medicine

## 2017-06-03 ENCOUNTER — Ambulatory Visit (INDEPENDENT_AMBULATORY_CARE_PROVIDER_SITE_OTHER): Payer: 59 | Admitting: Family Medicine

## 2017-06-03 ENCOUNTER — Encounter: Payer: Self-pay | Admitting: Family Medicine

## 2017-06-03 ENCOUNTER — Ambulatory Visit (INDEPENDENT_AMBULATORY_CARE_PROVIDER_SITE_OTHER): Payer: 59

## 2017-06-03 VITALS — BP 116/82 | HR 69 | Temp 98.2°F

## 2017-06-03 DIAGNOSIS — M25551 Pain in right hip: Secondary | ICD-10-CM | POA: Diagnosis not present

## 2017-06-03 DIAGNOSIS — E039 Hypothyroidism, unspecified: Secondary | ICD-10-CM | POA: Diagnosis not present

## 2017-06-03 DIAGNOSIS — E038 Other specified hypothyroidism: Secondary | ICD-10-CM

## 2017-06-03 DIAGNOSIS — M25552 Pain in left hip: Secondary | ICD-10-CM

## 2017-06-03 NOTE — Assessment & Plan Note (Signed)
New problem to me. Based off her presentation, this is likely coming from the lumbar spine. However, she could have underlying osteoarthritis of the hips. Arranging x-ray. Will discuss treatment options based on findings. X-ray negative, this is likely, for the lumbar spine and she will need to see neurosurgery.

## 2017-06-03 NOTE — Patient Instructions (Signed)
We will call with the results & with the referral.  Take care  Dr. Lacinda Axon

## 2017-06-03 NOTE — Progress Notes (Signed)
Subjective:  Patient ID: Michelle Armstrong, female    DOB: 01/15/1961  Age: 56 y.o. MRN: [id number]  CC: Hip pain/back pain  HPI:  56 year old female with chronic back pain presents with the above complaints.  Patient has had ongoing back and hip pain for the past 1.5 years. She's had MRI of her lumbar spine is also had injection. She had no improvement with injection. She states that recently her pain is worsening. She has bilateral low back pain. She also has bilateral hip pain which radiates to her groin. She also states that she has radiating pain down her legs. No bowel or bladder incontinence. She is concerned that she's not had any improvement and things seem to be worse. Moderate to severe at times. Worse with range of motion. No known relieving factors. No other associated symptoms.  Additionally, patient states that she recently took Synthroid and cannot tolerate it. She states that she had abdominal pain and exacerbation of her suspected symptoms of hypothyroidism (dry skin, hair loss, weight gain). Patient states that she took levothyroxine for a few weeks.  Social Hx   Social History   Social History  . Marital status: Married    Spouse name: N/A  . Number of children: 1  . Years of education: N/A   Occupational History  .  Lab [employer]   Social History Main Topics  . Smoking status: Never Smoker  . Smokeless tobacco: Never Used     Comment: tobacco use- no   . Alcohol use No     Comment: rare glass of wine.  . Drug use: No  . Sexual activity: Yes   Other Topics Concern  . None   Social History Narrative   Lives locally with husband.  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    Review of Systems  Constitutional:       Weight gain  Musculoskeletal: Positive for arthralgias and back pain.  Skin:       Dry skin, hair loss.   Objective:  BP 116/82 (BP Location: Left Arm, Patient Position: Sitting, Cuff Size: Normal)   Pulse 69   Temp 98.2 F (36.8  C) (Oral)   SpO2 98%   BP/Weight 06/03/2017 05/22/2017 05/14/9370  Systolic BP 696 789 381  Diastolic BP 82 70 70  Wt. (Lbs) - - -  BMI - - -    Physical Exam  Constitutional: She is oriented to person, place, and time. She appears well-developed. No distress.  Cardiovascular: Normal rate and regular rhythm.   Pulmonary/Chest: Effort normal. She has no wheezes. She has no rales.  Musculoskeletal:  Hips - decreased range of motion bilaterally. Patient seems to be guarding. Patient has pain in the low back with FABER. Negative FADIR. Negative straight leg raise.  Neurological: She is alert and oriented to person, place, and time.  Psychiatric:  Anxious, perseverative.  Vitals reviewed.   Lab Results  Component Value Date   WBC 6.7 02/24/2017   HGB 13.1 02/24/2017   HCT 38.3 02/24/2017   PLT 219 02/24/2017   GLUCOSE 91 02/24/2017   CHOL 209 (H) 02/24/2017   TRIG 41 02/24/2017   HDL 91 02/24/2017   LDLCALC 110 (H) 02/24/2017   ALT 24 01/24/2017   AST 29 01/24/2017   NA 141 02/24/2017   K 4.0 02/24/2017   CL 107 02/24/2017   CREATININE 0.54 02/24/2017   BUN 21 (H) 02/24/2017   CO2 29 02/24/2017   TSH 5.707 (H) 02/23/2017  INR 1.10 02/24/2017   HGBA1C 5.3 02/23/2017    Assessment & Plan:   Problem List Items Addressed This Visit    Pain of both hip joints - Primary    New problem to me. Based off her presentation, this is likely coming from the lumbar spine. However, she could have underlying osteoarthritis of the hips. Arranging x-ray. Will discuss treatment options based on findings. X-ray negative, this is likely, for the lumbar spine and she will need to see neurosurgery.      Relevant Orders   DG HIPS BILAT WITH PELVIS 2V   Subclinical hypothyroidism    Patient states that she did not tolerate Synthroid. Sending to endocrinology for discussion about treatment options or close monitoring.      Relevant Orders   Ambulatory referral to Endocrinology       Follow-up: PRN  [place]

## 2017-06-03 NOTE — Assessment & Plan Note (Signed)
Patient states that she did not tolerate Synthroid. Sending to endocrinology for discussion about treatment options or close monitoring.

## 2017-06-05 ENCOUNTER — Other Ambulatory Visit: Payer: Self-pay | Admitting: Family Medicine

## 2017-06-05 DIAGNOSIS — G8929 Other chronic pain: Secondary | ICD-10-CM

## 2017-06-05 DIAGNOSIS — M5440 Lumbago with sciatica, unspecified side: Principal | ICD-10-CM

## 2017-06-06 ENCOUNTER — Telehealth: Payer: Self-pay | Admitting: *Deleted

## 2017-06-06 NOTE — Telephone Encounter (Signed)
Pt requested to know where she would be sent for her neurosurgery referral.  A detail message can be left on the voicemail .  Pt contact [phone]

## 2017-06-10 ENCOUNTER — Encounter: Payer: Self-pay | Admitting: Gastroenterology

## 2017-06-23 ENCOUNTER — Ambulatory Visit (INDEPENDENT_AMBULATORY_CARE_PROVIDER_SITE_OTHER): Payer: 59 | Admitting: Gastroenterology

## 2017-06-23 ENCOUNTER — Other Ambulatory Visit: Payer: Self-pay | Admitting: Gastroenterology

## 2017-06-23 ENCOUNTER — Encounter: Payer: Self-pay | Admitting: Gastroenterology

## 2017-06-23 ENCOUNTER — Encounter (INDEPENDENT_AMBULATORY_CARE_PROVIDER_SITE_OTHER): Payer: Self-pay

## 2017-06-23 VITALS — BP 94/68 | HR 76 | Ht 67.0 in | Wt 155.0 lb

## 2017-06-23 DIAGNOSIS — K2100 Gastro-esophageal reflux disease with esophagitis, without bleeding: Secondary | ICD-10-CM

## 2017-06-23 DIAGNOSIS — Z86010 Personal history of colonic polyps: Secondary | ICD-10-CM | POA: Diagnosis not present

## 2017-06-23 DIAGNOSIS — K769 Liver disease, unspecified: Secondary | ICD-10-CM | POA: Diagnosis not present

## 2017-06-23 DIAGNOSIS — R1030 Lower abdominal pain, unspecified: Secondary | ICD-10-CM

## 2017-06-23 DIAGNOSIS — K5904 Chronic idiopathic constipation: Secondary | ICD-10-CM

## 2017-06-23 NOTE — Patient Instructions (Signed)
You have been scheduled for an endoscopy. Please follow written instructions given to you at your visit today. If you use inhalers (even only as needed), please bring them with you on the day of your procedure. Your physician has requested that you go to www.startemmi.com and enter the access code given to you at your visit today. This web site gives a general overview about your procedure. However, you should still follow specific instructions given to you by our office regarding your preparation for the procedure.  Patient advised to avoid spicy, acidic, citrus, chocolate, mints, fruit and fruit juices.  Limit the intake of caffeine, alcohol and Soda.  Don't exercise too soon after eating.  Don't lie down within 3-4 hours of eating.  Elevate the head of your bed.  You have been scheduled for an MRI at [facility] ([address]) on 07/02/17. Your appointment time is 5:40pm. Please arrive 15 minutes prior to your appointment time for registration purposes. Please make certain not to have anything to eat or drink 6 hours prior to your test. In addition, if you have any metal in your body, have a pacemaker or defibrillator, please be sure to let your ordering physician know. This test typically takes 45 minutes to 1 hour to complete. Should you need to reschedule, please call [phone] to do so.  Thank you for choosing me and [facility].  Pricilla Riffle. Dagoberto Ligas., MD., [facility]

## 2017-06-23 NOTE — Progress Notes (Signed)
History of Present Illness: This is a 56 year old female referred by Leone Haven, MD for the evaluation of GERD. She provides records from her past colonoscopies and 1 EGD that was performed prior to her gastric bypass in 2012. She has chronic constipation with frequent lower abdominal pain and back pain. Stools described as small pellets with straining. Notes bloating and weight gain. States her dentist noted erosions concerning for GERD. Previously followed by Loistine Simas, MD and is changing GI MDs. Denies weight loss, diarrhea, change in stool caliber, melena, hematochezia, nausea, vomiting, dysphagia, chest pain.  Colonoscopy 06/2015 - One 2 mm polyp in the descending colon. Resected and retrieved. - Two less than 1 mm polyps in the rectum. Resected and retrieved.  EGD 09/2010: - LA class B esophagitis, erosive gastritis, duodenitis.    Allergies  Allergen Reactions  . Cephalexin     Rash & dizziness  . Latex     rash  . Metoprolol     REACTION: dizziness, slurred speech  . Nsaids Other (See Comments)    ulcers  . Penicillin G Itching  . Pollen Extract    Outpatient Medications Prior to Visit  Medication Sig Dispense Refill  . aspirin EC 81 MG tablet Take 1 tablet (81 mg total) by mouth daily. 30 tablet 2  . Calcium Carbonate 500 MG CHEW Chew 1 tablet by mouth daily.    . Multiple Vitamins-Minerals (MULTIVITAMIN PO) Take 1 tablet by mouth daily.     No facility-administered medications prior to visit.    Past Medical History:  Diagnosis Date  . HLD (hyperlipidemia)   . Hypothyroidism    possible h/o  . Osteopenia 2016   spine/hip; [facility] at [facility]  . 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 02/2010 Echo: EF 55-60%, no rwma.  . Vitamin D deficiency    low   Past Surgical History:  Procedure Laterality Date  . ABDOMINAL HYSTERECTOMY  1999   Total lap hyst BSO due to endometriosis  . CARDIAC ELECTROPHYSIOLOGY [place] AND  ABLATION  2011  . COLONOSCOPY WITH PROPOFOL N/A 07/10/2015   Procedure: COLONOSCOPY WITH PROPOFOL;  Surgeon: Lollie Sails, MD;  Location: [facility] ENDOSCOPY;  Service: Endoscopy;  Laterality: N/A;  . GALLBLADDER SURGERY    . HERNIA REPAIR  12/2010  . LEFT HEART CATH AND CORONARY ANGIOGRAPHY N/A 02/24/2017   Procedure: Left Heart Cath and Coronary Angiography;  Surgeon: Wellington Hampshire, MD;  Location: [facility] CV LAB;  Service: Cardiovascular;  Laterality: N/A;   Social History   Social History  . Marital status: Married    Spouse name: N/A  . Number of children: 1  . Years of education: N/A   Occupational History  . 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 Smoking status: Never Smoker  . Smokeless tobacco: Never Used     Comment: tobacco use- no   . Alcohol use 0.0 oz/week     Comment: rare glass of wine.  . Drug use: No  . Sexual activity: Yes   Other Topics Concern  . None   Social History Narrative   Lives locally with husband.  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   Family History  Problem Relation Age of Onset  . Heart disease Father        s/p CABG  . Obesity Father   . Hyperlipidemia Father   . Bladder  Cancer Father   . Drug abuse Father   . Thyroid disease Sister   . Clotting disorder Sister        gene mutation  . Breast cancer Neg Hx       Review of Systems: Pertinent positive and negative review of systems were noted in the above HPI section. All other review of systems were otherwise negative.   Physical Exam: General: Well developed, well nourished, no acute distress Head: Normocephalic and atraumatic Eyes:  sclerae anicteric, EOMI Ears: Normal auditory acuity Mouth: No deformity or lesions Neck: Supple, no masses or thyromegaly Lungs: Clear throughout to auscultation Heart: Regular rate and rhythm; no murmurs, rubs or bruits Abdomen: Soft, mild generalized abdominal tendernes and non distended. No masses,  hepatosplenomegaly or hernias noted. Normal Bowel sounds Musculoskeletal: Symmetrical with no gross deformities  Skin: No lesions on visible extremities Pulses:  Normal pulses noted Extremities: No clubbing, cyanosis, edema or deformities noted Neurological: Alert oriented x 4, grossly nonfocal Cervical Nodes:  No significant cervical adenopathy Inguinal Nodes: No significant inguinal adenopathy Psychological:  Alert and cooperative. Normal mood and affect  Assessment and Recommendations:  1. GERD with LA class B erosive esophagitis. Dental erosions, throat clearing concerning for GERD with LPR. Pt declined PPI until EGD completed. State she would rather not take any medications. Schedule EGD. The risks (including bleeding, perforation, infection, missed lesions, medication reactions and possible hospitalization or surgery if complications occur), benefits, and alternatives to endoscopy with possible biopsy and possible dilation were discussed with the patient and they consent to proceed.   2. Large hepatic hemangioma and abdominal pain. 4.8 cm on CT in 2012 and 3.7 cm on MRI in 2007. R/O enlargement. Schedule abd MRI.   3.  Lower abdominal pain, generalized abdominal tenderness. Likely related to constipation. Recommended abd/pelvic MRI as above.   4. Personal history of adenomatous colon polyps. Surveillance colonoscopy in 06/2020.   5. S/P gastric bypass 2012.  6. Chronic idiopathic constipation. Continue Benefiber daily and adequate daily hydration. Start Miralax tid-qid today then Miralax qd long term.    cc: Leone Haven, MD [place], [place] [address]

## 2017-06-24 ENCOUNTER — Ambulatory Visit (AMBULATORY_SURGERY_CENTER): Payer: 59 | Admitting: Gastroenterology

## 2017-06-24 ENCOUNTER — Telehealth: Payer: Self-pay | Admitting: Gastroenterology

## 2017-06-24 ENCOUNTER — Encounter: Payer: Self-pay | Admitting: Gastroenterology

## 2017-06-24 VITALS — BP 107/72 | HR 64 | Temp 97.3°F | Resp 12 | Ht 67.0 in | Wt 155.0 lb

## 2017-06-24 DIAGNOSIS — K219 Gastro-esophageal reflux disease without esophagitis: Secondary | ICD-10-CM

## 2017-06-24 DIAGNOSIS — R1013 Epigastric pain: Secondary | ICD-10-CM

## 2017-06-24 MED ORDER — SODIUM CHLORIDE 0.9 % IV SOLN: 500.0000 mL | INTRAVENOUS | Status: DC

## 2017-06-24 MED ORDER — PANTOPRAZOLE SODIUM 40 MG PO TBEC: 40.0000 mg | DELAYED_RELEASE_TABLET | Freq: Every day | ORAL | 11 refills | Status: DC

## 2017-06-24 NOTE — Patient Instructions (Signed)
YOU HAD AN ENDOSCOPIC PROCEDURE TODAY AT [facility]:   Refer to the procedure report that was given to you for any specific questions about what was found during the examination.  If the procedure report does not answer your questions, please call your gastroenterologist to clarify.  If you requested that your care partner not be given the details of your procedure findings, then the procedure report has been included in a sealed envelope for you to review at your convenience later.  YOU SHOULD EXPECT: Some feelings of bloating in the abdomen. Passage of more gas than usual.  Walking can help get rid of the air that was put into your GI tract during the procedure and reduce the bloating. If you had a lower endoscopy (such as a colonoscopy or flexible sigmoidoscopy) you may notice spotting of blood in your stool or on the toilet paper. If you underwent a bowel prep for your procedure, you may not have a normal bowel movement for a few days.  Please Note:  You might notice some irritation and congestion in your nose or some drainage.  This is from the oxygen used during your procedure.  There is no need for concern and it should clear up in a day or so.  SYMPTOMS TO REPORT IMMEDIATELY:     Following upper endoscopy (EGD)  Vomiting of blood or coffee ground material  New chest pain or pain under the shoulder blades  Painful or persistently difficult swallowing  New shortness of breath  Fever of 100F or higher  Black, tarry-looking stools  For urgent or emergent issues, a gastroenterologist can be reached at any hour by calling [phone].   DIET:  Follow Diet Handout.  ACTIVITY:  You should plan to take it easy for the rest of today and you should NOT DRIVE or use heavy machinery until tomorrow (because of the sedation medicines used during the test).    FOLLOW UP: Our staff will call the number listed on your records the next business day following your procedure to  check on you and address any questions or concerns that you may have regarding the information given to you following your procedure. If we do not reach you, we will leave a message.  However, if you are feeling well and you are not experiencing any problems, there is no need to return our call.  We will assume that you have returned to your regular daily activities without incident.  If any biopsies were taken you will be contacted by phone or by letter within the next 1-3 weeks.  Please call us at [phone] if you have not heard about the biopsies in 3 weeks.    SIGNATURES/CONFIDENTIALITY: You and/or your care partner have signed paperwork which will be entered into your electronic medical record.  These signatures attest to the fact that that the information above on your After Visit Summary has been reviewed and is understood.  Full responsibility of the confidentiality of this discharge information lies with you and/or your care-partner.  Hold ASA for 2 weeks. Follow up in office in 6 weeks.Follow Diet Handout.

## 2017-06-24 NOTE — Op Note (Signed)
[facility] Patient Name: Michelle Armstrong Procedure Date: 06/24/2017 8:22 AM MRN: [id number] Endoscopist: Ladene Artist , MD Age: 56 Referring MD:  Date of Birth: Sep 13, 1961 Gender: Female Account #: [id number] Procedure:                Upper GI endoscopy Indications:              Epigastric abdominal pain, Gastroesophageal reflux                            disease Medicines:                Monitored Anesthesia Care Procedure:                Pre-Anesthesia Assessment:                           - Prior to the procedure, a History and Physical                            was performed, and patient medications and                            allergies were reviewed. The patient's tolerance of                            previous anesthesia was also reviewed. The risks                            and benefits of the procedure and the sedation                            options and risks were discussed with the patient.                            All questions were answered, and informed consent                            was obtained. Prior Anticoagulants: The patient has                            taken no previous anticoagulant or antiplatelet                            agents. ASA Grade Assessment: II - A patient with                            mild systemic disease. After reviewing the risks                            and benefits, the patient was deemed in                            satisfactory condition to undergo the procedure.  After obtaining informed consent, the endoscope was                            passed under direct vision. Throughout the                            procedure, the patient's blood pressure, pulse, and                            oxygen saturations were monitored continuously. The                            Endoscope was introduced through the mouth, and                            advanced to the efferent jejunal loop.  The upper GI                            endoscopy was accomplished without difficulty. The                            patient tolerated the procedure well. Scope In: Scope Out: Findings:                 The examined esophagus was normal.                           Evidence of a gastric bypass was found. A gastric                            pouch with a medium size was found. The staple line                            appeared intact. The gastrojejunal anastomosis was                            characterized by healthy appearing mucosa. This was                            traversed. Complications:            No immediate complications. Estimated Blood Loss:     Estimated blood loss was minimal. Impression:               - Normal esophagus.                           - Gastric bypass with a medium-sized pouch and                            intact staple line. Gastrojejunal anastomosis                            characterized by healthy appearing mucosa.  Remaining stomach appeared normal. Retroflexed view                            appeared normal. After retroflexion a small mucosal                            abrasion was noted in the jejunum just distal to                            the anastomosis.                           - No specimens collected. Recommendation:           - Patient has a contact number available for                            emergencies. The signs and symptoms of potential                            delayed complications were discussed with the                            patient. Return to normal activities tomorrow.                            Written discharge instructions were provided to the                            patient.                           - Clear liquid diet for 2 hours, then advance as                            tolerated to soft diet today. Resume prior diet                            tomorrow.                           -  Continue present medications.                           - Hold ASA for 2 weeks and assess symptoms.                           - Protonix (pantoprazole) 40 mg PO daily, 1 year of                            refills.                           - Return to GI office in 6 weeks. Ladene Artist, MD 06/24/2017 8:48:03 AM This report has been signed electronically.

## 2017-06-24 NOTE — Progress Notes (Signed)
Report to PACU, RN, vss, BBS= Clear.  

## 2017-06-24 NOTE — Telephone Encounter (Signed)
Patient notified that MRI is for hemangioma and to assess her abdominal pain.

## 2017-06-25 ENCOUNTER — Telehealth: Payer: Self-pay

## 2017-06-25 NOTE — Telephone Encounter (Signed)

## 2017-07-01 ENCOUNTER — Other Ambulatory Visit: Payer: Self-pay | Admitting: Gastroenterology

## 2017-07-01 ENCOUNTER — Telehealth: Payer: Self-pay

## 2017-07-01 NOTE — Telephone Encounter (Signed)
Patient has decided to have CT scan after speaking with her insurance company. They will not cover the MRI. Called [facility] and cancelled MRI and scheduled CT scan abdomen and pelvis for tomorrow 07/02/17 at 4:20pm. Informed patient that she needs to come by our office or [facility] to pick up the contrast. Patient states she cannot get off work early. She then states she will call me back.

## 2017-07-01 NOTE — Telephone Encounter (Signed)
Patient states she would like me to fax a order to [facility] imaging and she has already spoke to them and scheduled the scan for 07/09/17 8:00am. [facility] and cancelled CT scan with them. Faxed order to [facility] imaging.

## 2017-07-01 NOTE — Telephone Encounter (Signed)
Informed patient that her MRI of the abdomen is not covered by her insurance. Patient states she doesn't understand why it is not covered and she will contact her insurance company to discuss. I informed patient that they would prefer her to have a CT scan first. Patient states she does not want to be exposed to radiation of a CT scan. I asked patient if she wants me to keep MRI scheduled at this time. Patient states yes until she contacts me back. Patient asked if Dr. Fuller Plan has any recommendations for a more natural reflux medication in place of the Protonix. She states she has not had a good experience in the past with this type of medication and is not going to take it. Informed patient this is what Dr. Fuller Plan recommends but she can take Zantac or Prilosec OTC if she prefers. Patient states she wants something more natural. Informed patient he does not have any recommendations for that type of medications. Patient verbalized understanding.

## 2017-07-01 NOTE — Addendum Note (Signed)
Addended by: Marzella Schlein on: 07/01/2017 02:15 PM   Modules accepted: Orders

## 2017-07-02 ENCOUNTER — Other Ambulatory Visit: Payer: 59

## 2017-07-09 ENCOUNTER — Ambulatory Visit
Admission: RE | Admit: 2017-07-09 | Discharge: 2017-07-09 | Disposition: A | Discharge status: Home / Self Care | Payer: 59 | Source: Ambulatory Visit | Attending: Gastroenterology | Admitting: Gastroenterology

## 2017-07-09 DIAGNOSIS — K769 Liver disease, unspecified: Secondary | ICD-10-CM

## 2017-07-09 DIAGNOSIS — Z90710 Acquired absence of both cervix and uterus: Secondary | ICD-10-CM | POA: Insufficient documentation

## 2017-07-09 DIAGNOSIS — Z9884 Bariatric surgery status: Secondary | ICD-10-CM | POA: Diagnosis not present

## 2017-07-09 DIAGNOSIS — Z9049 Acquired absence of other specified parts of digestive tract: Secondary | ICD-10-CM | POA: Diagnosis not present

## 2017-07-09 DIAGNOSIS — D1803 Hemangioma of intra-abdominal structures: Secondary | ICD-10-CM | POA: Insufficient documentation

## 2017-07-09 DIAGNOSIS — R1030 Lower abdominal pain, unspecified: Secondary | ICD-10-CM

## 2017-07-09 MED ORDER — IOPAMIDOL (ISOVUE-300) INJECTION 61%
100.0000 mL | Freq: Once | INTRAVENOUS | Status: AC | PRN
  Administered 2017-07-09: 100 mL via INTRAVENOUS

## 2017-07-11 ENCOUNTER — Encounter: Payer: Self-pay | Admitting: Internal Medicine

## 2017-07-11 ENCOUNTER — Ambulatory Visit (INDEPENDENT_AMBULATORY_CARE_PROVIDER_SITE_OTHER): Payer: 59 | Admitting: Internal Medicine

## 2017-07-11 VITALS — BP 128/62 | HR 78 | Ht 67.0 in

## 2017-07-11 DIAGNOSIS — R635 Abnormal weight gain: Secondary | ICD-10-CM

## 2017-07-11 DIAGNOSIS — E038 Other specified hypothyroidism: Secondary | ICD-10-CM

## 2017-07-11 DIAGNOSIS — E039 Hypothyroidism, unspecified: Secondary | ICD-10-CM

## 2017-07-11 NOTE — Progress Notes (Signed)
Patient ID: Michelle Armstrong, female   DOB: Apr 28, 1961, 56 y.o.   MRN: [id number]    HPI  Michelle Armstrong is a 56 y.o.-year-old female, referred by her PCP, Dr.Sonnenberg, for management of subclinical hypothyroidism.  She was told she had suclinical hypothyroidism in 2010 after an episode of SVT. She was not treated at that time as her condition was mild. Since then, she started to develop more hypothyroid symptoms: Weight gain, fatigue, cold intolerance, insomnia, constipation, dry skin.   She had an elevated TSH in 02/2017 at her visit with PCP >> she was started on Levothyroxine >> increased abdominal pain, fatigue, irritability, HA, feeling hot, acne.  I reviewed pt's thyroid tests: Lab Results  Component Value Date   TSH 5.707 (H) 02/23/2017   TSH 2.070 12/27/2015   FREET4 0.91 02/23/2017   FREET4 1.22 12/27/2015    Pt describes: - weight gain (15 lbs in last 2 years) - very bothersome - fatigue, poor sleep - cold intolerance - constipation - dry skin - hair loss - No anxiety and depression  She had a TAH + BSO in 2001 >> started Premarin >> then stopped >> started to gain weight. *She reveals more information about her weight gain and subsequent loss but she would not want me to write this down In her chart - this is a very sensitive subject for her.*  Pt denies feeling nodules in neck, hoarseness, dysphagia/odynophagia, SOB with lying down. She has to clear her throat frequently.  She has + FH of thyroid disorders in: mother, sister with hyperthyroidism then hypothyroidism. No FH of thyroid cancer.  No h/o radiation tx to head or neck. No recent use of iodine supplements.   She had an abdominal CT scan with contrast (iodine load) 2 days ago.  ROS: Constitutional:+ See history of present illness  Eyes: no blurry vision, no xerophthalmia ENT: no sore throat,+ see history of present illness  Cardiovascular: no CP/SOB/palpitations/leg swelling Respiratory: no  cough/SOB Gastrointestinal: no N/V/D/ + C Musculoskeletal: no muscle/joint aches Skin: no rashes, + Hair loss, + dry skin  Neurological: no tremors/numbness/tingling/dizziness, + Headache  Psychiatric: no depression/anxiety  Past Medical History:  Diagnosis Date  . 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 HLD (hyperlipidemia)   . Hypothyroidism    possible h/o  . Liver hemangioma    per patient  . Osteopenia 2016   spine/hip; DEXA at [facility]  . 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 02/2010 Echo: EF 55-60%, no rwma.  . Vitamin D deficiency    low   Past Surgical History:  Procedure Laterality Date  . ABDOMINAL HYSTERECTOMY  1999   Total lap hyst BSO due to endometriosis  . CARDIAC ELECTROPHYSIOLOGY [place] AND ABLATION  2011  . COLONOSCOPY WITH PROPOFOL N/A 07/10/2015   Procedure: COLONOSCOPY WITH PROPOFOL;  Surgeon: Lollie Sails, MD;  Location: [facility] ENDOSCOPY;  Service: Endoscopy;  Laterality: N/A;  . GALLBLADDER SURGERY    . HERNIA REPAIR  12/2010  . LEFT HEART CATH AND CORONARY ANGIOGRAPHY N/A 02/24/2017   Procedure: Left Heart Cath and Coronary Angiography;  Surgeon: Wellington Hampshire, MD;  Location: [facility] CV LAB;  Service: Cardiovascular;  Laterality: N/A;  . TONSILLECTOMY     Social History   Social History  . Marital status: Married    Spouse name: N/A  . Number of children: 1  .  Years of education: N/A   Occupational History  . 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 Smoking status: Never Smoker  . Smokeless tobacco: Never Used     Comment: tobacco use- no   . Alcohol use 0.0 oz/week     Comment: rare glass of wine.  . Drug use: No  . Sexual activity: Yes   Other Topics Concern  . Not on file   Social History Narrative   Lives locally with husband.  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   Current Outpatient Prescriptions  on File Prior to Visit  Medication Sig Dispense Refill  . Calcium Carbonate 500 MG CHEW Chew 1 tablet by mouth daily.    . Multiple Vitamins-Minerals (MULTIVITAMIN PO) Take 1 tablet by mouth daily.    Marland Kitchen aspirin EC 81 MG tablet Take 1 tablet (81 mg total) by mouth daily. (Patient not taking: Reported on 07/11/2017) 30 tablet 2   No current facility-administered medications on file prior to visit.    Allergies  Allergen Reactions  . Cephalexin     Rash & dizziness  . Latex     rash  . Metoprolol     REACTION: dizziness, slurred speech  . Nsaids Other (See Comments)    ulcers  . Penicillin G Itching  . Pollen Extract    Family History  Problem Relation Age of Onset  . Heart disease Father        s/p CABG  . Obesity Father   . Hyperlipidemia Father   . Bladder Cancer Father   . Drug abuse Father   . Thyroid disease Sister   . Clotting disorder Sister        gene mutation  . Breast cancer Neg Hx    Also, bladder cancer in father.   PE: BP 128/62 (BP Location: Left Arm, Patient Position: Sitting)   Pulse 78   Ht 5\' 7"  (1.702 m)   SpO2 98%  Refuses a weight today. Wt Readings from Last 3 Encounters:  06/24/17 155 lb (70.3 kg)  06/23/17 155 lb (70.3 kg)  03/03/17 152 lb (68.9 kg)   Constitutional: normal weight, in NAD Eyes: PERRLA, EOMI, no exophthalmos ENT: moist mucous membranes, no thyromegaly, + B upper cervical lymphadenopathy Cardiovascular: RRR, No MRG Respiratory: CTA B Gastrointestinal: abdomen soft, NT, ND, BS+ Musculoskeletal: no deformities, strength intact in all 4 Skin: moist, warm, no rashes Neurological: no tremor with outstretched hands, DTR normal in all 4  ASSESSMENT: 1. Subclinical Hypothyroidism  2. Weight gain  PLAN:  1. Patient with long-standing mild subclinical hypothyroidism, not on levothyroxine therapy. She tried to take a low-dose levothyroxine, 25 g daily and was intolerant to it (developed abdominal pain, hot flashes, headache,  increased weight gain and fatigue). We discussed that this is most likely either due to it higher dose that she actually needed or, more likely, due to the excipients in the tablet (coloring agents, preservatives). We discussed that if we need to restart thyroid medication in the future, she may benefit from using half of the 50 g tablet which does not have any coloring agents or, better, liquid levothyroxine in the form of Tirosint. She is more interested in [place], if we need to start. We also discussed about the treatment criteria for subclinical hypothyroidism: If TSH is higher than 10, if patient is trying to get pregnant, or if she has clear signs and symptoms of hypothyroidism. Since she does have the latter,  I think it would not be unreasonable to try to start LT4. I advised her to think about it and let me know after the results of the labs are back. - she appears euthyroid, but has many general, nonspecific, symptoms, which I doubt are related to her hypothyroidism. I explained that she has only mild elevation in TSH, which is unlikely to cause the constellation of symptoms that she is experiencing, but we did discuss about the possibility of her having Hashimoto thyroiditis, which is known to cause hypothyroid symptoms without clearly abnormal TFTs - we will check her thyroid antibodies at the next blood draw. - I would like to check her TFTs today, but she just had an iodine load (CT scan) 2 days ago. Therefore, we will wait approximately 1 month before checking - given her printed prescriptions for the labs since she works at [employer] and would like to have checked done there - she does not appear to have a goiter, thyroid nodules, or neck compression symptoms. She does need to clear her throat frequently, which can be due to allergies. She does have bilateral upper cervical lymphadenopathy on palpation, more likely related to allergies. - I will see her back in 4 months  2. Weight gain - Very  sensitive issue for her, she did not want to get weighed today did not want me to add her medical history related to this problem in my note. - Discussed about the benefits of a plant-based diet and given references  Orders Placed This Encounter  Procedures  . TSH  . T4, free  . T3, free  . Thyroglobulin antibody  . Thyroid peroxidase antibody   Philemon Kingdom, MD PhD [facility] Endocrinology

## 2017-07-11 NOTE — Patient Instructions (Signed)
Please have labs in 1 months.  Please consider the following ways to cut down carbs and fat and increase fiber and micronutrients in your diet: - substitute whole grain for white bread or pasta - substitute brown rice for white rice - substitute 90-calorie flat bread pieces for slices of bread when possible - substitute sweet potatoes or yams for white potatoes - substitute humus for margarine - substitute tofu for cheese when possible - substitute almond or rice milk for regular milk (would not drink soy milk daily due to concern for soy estrogen influence on breast cancer risk) - substitute dark chocolate for other sweets when possible - substitute water - can add lemon or orange slices for taste - for diet sodas (artificial sweeteners will trick your body that you can eat sweets without getting calories and will lead you to overeating and weight gain in the long run) - do not skip breakfast or other meals (this will slow down the metabolism and will result in more weight gain over time)  - can try smoothies made from fruit and almond/rice milk in am instead of regular breakfast - can also try old-fashioned (not instant) oatmeal made with almond/rice milk in am - order the dressing on the side when eating salad at a restaurant (pour less than half of the dressing on the salad) - eat as little meat as possible - can try juicing, but should not forget that juicing will get rid of the fiber, so would alternate with eating raw veg./fruits or drinking smoothies - use as little oil as possible, even when using olive oil - can dress a salad with a mix of balsamic vinegar and lemon juice, for e.g. - use agave nectar, stevia sugar, or regular sugar rather than artificial sweateners - steam or broil/roast veggies  - snack on veggies/fruit/nuts (unsalted, preferably) when possible, rather than processed foods - reduce or eliminate aspartame in diet (it is in diet sodas, chewing gum, etc) Read the  labels!  Please look up "The Engine 2 diet" by Christa See. Also check out the "7 day rescue diet".

## 2017-07-14 ENCOUNTER — Telehealth: Payer: Self-pay | Admitting: Internal Medicine

## 2017-07-14 ENCOUNTER — Telehealth: Payer: Self-pay | Admitting: Family Medicine

## 2017-07-14 NOTE — Telephone Encounter (Signed)
Pt called wanting to know if her lab results was received it fax on Friday. Please advise?  Call pt @ [phone]. Thank you!

## 2017-07-14 NOTE — Telephone Encounter (Signed)
Left message to return call we have not received labs

## 2017-07-14 NOTE — Telephone Encounter (Signed)
Patient was notified that we have not received labs

## 2017-07-14 NOTE — Telephone Encounter (Signed)
Received labs, placed on Dr.Gherghe's desk for her to review and then scan into chart.

## 2017-07-14 NOTE — Telephone Encounter (Signed)
Pt is wanted to check to be sure the lab results she faxed in for you was received

## 2017-07-14 NOTE — Telephone Encounter (Signed)
Will have to look through faxes.

## 2017-07-31 ENCOUNTER — Ambulatory Visit: Payer: 59 | Admitting: Internal Medicine

## 2017-08-04 ENCOUNTER — Ambulatory Visit: Payer: 59 | Admitting: Gastroenterology

## 2017-08-14 ENCOUNTER — Other Ambulatory Visit: Payer: Self-pay | Admitting: Internal Medicine

## 2017-08-15 ENCOUNTER — Encounter: Payer: Self-pay | Admitting: Internal Medicine

## 2017-08-15 ENCOUNTER — Telehealth: Payer: Self-pay

## 2017-08-15 LAB — TSH: TSH: 2.77 u[IU]/mL (ref 0.450–4.500)

## 2017-08-15 LAB — T3, FREE: T3, Free: 2.5 pg/mL (ref 2.0–4.4)

## 2017-08-15 LAB — THYROID PEROXIDASE ANTIBODY: Thyroperoxidase Ab SerPl-aCnc: 7 IU/mL (ref 0–34)

## 2017-08-15 LAB — T4, FREE: FREE T4: 1.04 ng/dL (ref 0.82–1.77)

## 2017-08-15 LAB — THYROGLOBULIN ANTIBODY: Thyroglobulin Antibody: <1 IU/mL (ref 0.0–0.9)

## 2017-08-15 NOTE — Telephone Encounter (Signed)
Called patient to inform her that her labs from [place] came back and her thyroid was normal and no need for a medication. LMTCB

## 2017-08-15 NOTE — Telephone Encounter (Signed)
Pt is aware.  

## 2017-08-15 NOTE — Progress Notes (Signed)
Labs drawn on 08/14/2017: - TSH 2.77 (0.45-4.5), free T4 1.04 (0.82-1.77), free T3 2.5 (2.0-4.5) - TPO antibodies 7 (0-34), antithyroglobulin antibodies <1.0 (0-0.9)  All labs normal.  No need for levothyroxine supplementation. No signs of Hashimoto's thyroiditis.

## 2017-08-18 ENCOUNTER — Telehealth: Payer: Self-pay | Admitting: Internal Medicine

## 2017-08-18 NOTE — Telephone Encounter (Signed)
Courtney left voice mail re: results from [place] but did not answer 2nd part of question re: skin, hair loss, weight gain, nails, etc.  Please call and leave a detailed message with any suggestions or tests at ph# [phone] because mother & sister both take thyroid medication and patient has had 3 positive tests

## 2017-08-18 NOTE — Telephone Encounter (Signed)
At this point, her thyroid appears to be functioning normally and she does not have antithyroid antibodies. I would suggest to d/w PCP to see what else may be going on.

## 2017-08-18 NOTE — Telephone Encounter (Signed)
Please call patient at work to clarify questions patient has. Call work ph# [phone]

## 2017-08-18 NOTE — Telephone Encounter (Signed)
Patient asked do she need results from the ER 6 years ago [place] to compare for her thyroid today

## 2017-08-18 NOTE — Telephone Encounter (Signed)
LVM for patient to call back because in first message nothing was mentioned about a second part question and I am not sure of what exactly she is asking about. Pending call back.

## 2017-08-18 NOTE — Telephone Encounter (Signed)
Please advise if you want theses records

## 2017-08-18 NOTE — Telephone Encounter (Signed)
LVM informing patient on her cell phone which per DPR in chart is allowed by patient

## 2017-08-18 NOTE — Telephone Encounter (Signed)
Patient wanted to know if since her three positive tests for hypothyroidism and with her other symptoms that she discussed in previous visits about hair loss and weight gain, etc. Would that be requiring her to need more testing for other possible issues other than just the hypothyroidism (mainly since her most recent labs were normal). She doesn't want to add more medications but is concerned because her mother and sister are diagnosed with hypothyroidism as well. Also asking if she needs another referral to another office for a different speciality. Please advise

## 2017-08-18 NOTE — Telephone Encounter (Signed)
No, what I have now is enough.

## 2017-08-19 NOTE — Telephone Encounter (Signed)
LVM on pt cell phone explaining this message to her which per DPR in chart is allowed by patient

## 2017-09-01 ENCOUNTER — Telehealth: Payer: Self-pay | Admitting: Obstetrics and Gynecology

## 2017-09-01 ENCOUNTER — Encounter: Payer: Self-pay | Admitting: Obstetrics and Gynecology

## 2017-09-01 NOTE — Telephone Encounter (Signed)
Pt aware of DEXA results. 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 Pt doing calcium/VIt D supp and exercise. S/P hyst BSO 1999. Didn't do ERT.  Cont ca/Vit D/exercise. Recheck in 2 yrs. Bisphosphonate not recommended.

## 2017-09-02 ENCOUNTER — Telehealth: Payer: Self-pay

## 2017-09-02 NOTE — Telephone Encounter (Signed)
Patient is calling back. Please return phone call in the morning.

## 2017-09-02 NOTE — Telephone Encounter (Signed)
LMTCB   Copied from [place] [id number]. Topic: Appointment Scheduling - Scheduling Inquiry for Clinic >> Sep 02, 2017  8:29 AM Synthia Innocent wrote: Reason for CRM: Patient of Dr Marsa Aris, wants to see Dr Caryl Bis. Wants to be seen asap for calf pain. No appts available. Patient wants to know if he will work her in? Please

## 2017-09-03 NOTE — Telephone Encounter (Signed)
Tried to reach patient by phone no answer left message.

## 2017-09-03 NOTE — Telephone Encounter (Signed)
Patient denies swelling pain is to front part of legs not in calf will be here before 12 tomorrow, patient was very thankful.

## 2017-09-03 NOTE — Telephone Encounter (Signed)
Copied from [place] #[id number]. Topic: General - Other >> Sep 03, 2017  3:23 PM Valla Leaver wrote: Reason for CRM: Patient returning missed call from [facility] RE calf pain and seeing Dr. Caryl Bis. See phone note from today.

## 2017-09-03 NOTE — Telephone Encounter (Signed)
Sounds like these are chronic issues. You can offer her a noon appointment tomorrow, otherwise she could be seen at the walk in clinic or urgent care. Please clarify whether or not she has swelling and if the pain is only in the front of her legs bilaterally. If she has swelling and calf pain she should be seen today at urgent care or walk in. If these issues are acutely worse she should be seen at walk in or urgent care today. Thanks.

## 2017-09-03 NOTE — Telephone Encounter (Signed)
Bilateral anterior lower leg pain, since since sliding into  shopping cart in June, left leg at that time had a golf ball size lump, and since then on going in pain and feels lumps in the front area of legs bilaterally now. Above the area of the umbilical area she  Has discomfort has seen GI, but still having discomfort would like be seen for leg and abdominal pain. PEC advised patient she would need an establish care visit of 30 minutes she ask if she could be seen just for acute and then establish.

## 2017-09-03 NOTE — Telephone Encounter (Signed)
Left message for patient to return call to office. 

## 2017-09-04 ENCOUNTER — Telehealth: Payer: Self-pay | Admitting: Family Medicine

## 2017-09-04 ENCOUNTER — Encounter: Payer: Self-pay | Admitting: Family Medicine

## 2017-09-04 ENCOUNTER — Other Ambulatory Visit: Payer: Self-pay

## 2017-09-04 ENCOUNTER — Ambulatory Visit (INDEPENDENT_AMBULATORY_CARE_PROVIDER_SITE_OTHER): Payer: 59 | Admitting: Family Medicine

## 2017-09-04 ENCOUNTER — Ambulatory Visit
Admission: RE | Admit: 2017-09-04 | Discharge: 2017-09-04 | Disposition: A | Discharge status: Home / Self Care | Payer: 59 | Source: Ambulatory Visit | Attending: Family Medicine | Admitting: Family Medicine

## 2017-09-04 VITALS — BP 102/70 | HR 77 | Temp 98.4°F

## 2017-09-04 DIAGNOSIS — M7989 Other specified soft tissue disorders: Secondary | ICD-10-CM

## 2017-09-04 DIAGNOSIS — M79662 Pain in left lower leg: Secondary | ICD-10-CM | POA: Diagnosis not present

## 2017-09-04 DIAGNOSIS — R229 Localized swelling, mass and lump, unspecified: Secondary | ICD-10-CM

## 2017-09-04 DIAGNOSIS — M79669 Pain in unspecified lower leg: Secondary | ICD-10-CM

## 2017-09-04 DIAGNOSIS — D229 Melanocytic nevi, unspecified: Secondary | ICD-10-CM | POA: Diagnosis not present

## 2017-09-04 DIAGNOSIS — M79661 Pain in right lower leg: Secondary | ICD-10-CM | POA: Diagnosis present

## 2017-09-04 NOTE — Patient Instructions (Signed)
Nice to see you. Please get the ultrasound as planned.  We will discussed the result with you when it comes back. If you develop chest pain or shortness of breath prior to getting the ultrasound or after getting the ultrasound please be evaluated in the emergency room.

## 2017-09-04 NOTE — Assessment & Plan Note (Signed)
Status post removal.  No palpable defects.  Advised to monitor the area and if not improving follow-up with her dermatologist.

## 2017-09-04 NOTE — Assessment & Plan Note (Signed)
Bilateral lower extremities with slight swelling and also pain in bilateral calves and anterior nodularity with tenderness.  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  She has no PE symptoms.  Given her family history and reported genetic mutation I think it would be prudent to obtain ultrasounds to rule out DVT given her new calf pain and slight swelling.  She will have this done today.  Discussed if she has chest pain or trouble breathing prior to getting this done she needs to go to the emergency room

## 2017-09-04 NOTE — Telephone Encounter (Signed)
Patient has been scheduled will be seen today 12.20.18 at 12:00.

## 2017-09-04 NOTE — Progress Notes (Signed)
Tommi Rumps, MD Phone: [phone]  Michelle Armstrong is a 56 y.o. female who presents today for same-day visit.  Patient reports bilateral leg pain that has been going on for several months since hitting her shins on a shopping cart though recently she has noted some nodules anteriorly over the last week or so that she has not had previously.  She has also had calf pain and leg swelling that is new as well.  She reports that it throbs and burns.  She flew about 5 weeks ago.  No recent surgeries.  She does report she has a genetic mutation that places her at risk for blood clots.  Her son and her sister both had fairly extensive blood clots previously.  She notes no chest pain or shortness of breath.  She does report having a mole removed from her chin and since then has had a slight discomfort and nodule in the area of the prior mole.  She saw her dermatologist and they felt it may have just been swelling.  Social History   Tobacco Use  Smoking Status Never Smoker  Smokeless Tobacco Never Used  Tobacco Comment   tobacco use- no      ROS see history of present illness  Objective  Physical Exam Vitals:   09/04/17 1200  BP: 102/70  Pulse: 77  Temp: 98.4 F (36.9 C)  SpO2: 98%    BP Readings from Last 3 Encounters:  09/04/17 102/70  07/11/17 128/62  06/24/17 107/72   Wt Readings from Last 3 Encounters:  06/24/17 155 lb (70.3 kg)  06/23/17 155 lb (70.3 kg)  03/03/17 152 lb (68.9 kg)    Physical Exam  Constitutional: No distress.  Cardiovascular: Normal rate and normal heart sounds.  Pulmonary/Chest: Effort normal and breath sounds normal.  Musculoskeletal:  Bilateral lower extremities with slight swelling, there is tenderness in both calves left greater than right, slight nodularity anterior shins with some tenderness, possible varicose veins anteriorly in the left lower leg  Neurological: She is alert. Gait normal.  Skin: Skin is warm and dry. She is not  diaphoretic.  Well-healing lesion on her chin with no obvious palpable underlying defect, slight tenderness adjacent to the scar     Assessment/Plan: Please see individual problem list.  Pain and swelling of lower leg Bilateral lower extremities with slight swelling and also pain in bilateral calves and anterior nodularity with tenderness.  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  She has no PE symptoms.  Given her family history and reported genetic mutation I think it would be prudent to obtain ultrasounds to rule out DVT given her new calf pain and slight swelling.  She will have this done today.  Discussed if she has chest pain or trouble breathing prior to getting this done she needs to go to the emergency room  Nevus Status post removal.  No palpable defects.  Advised to monitor the area and if not improving follow-up with her dermatologist.   Angi was seen today for leg pain.  Diagnoses and all orders for this visit:  Pain and swelling of lower leg, unspecified laterality -     US Venous Img Lower Bilateral; Future  Nevus    Orders Placed This Encounter  Procedures  . US Venous Img Lower Bilateral    Standing Status:   Future  Standing Expiration Date:   11/05/2018    Order Specific Question:   Reason for Exam (SYMPTOM  OR DIAGNOSIS REQUIRED)    Answer:   bilateral calf pain and tenderness L>R, possible varicose veins vs nodules anteriorly, history of clotting mutation    Order Specific Question:   Preferred imaging location?    Answer:   [place]    No orders of the defined types were placed in this encounter.    Tommi Rumps, MD [place]

## 2017-09-04 NOTE — Telephone Encounter (Signed)
Neg DVT bilateral legs

## 2017-09-04 NOTE — Telephone Encounter (Signed)
Spoke patient regarding the negative ultrasound and advised there was no blood clot.  Discussed given nodularity noted that she could be evaluated by general surgery to see if they want to biopsy these areas.  She was agreeable to this and a referral was placed.

## 2017-09-05 NOTE — Telephone Encounter (Signed)
Patient was notified by Dr.Sonnenberg

## 2017-09-15 ENCOUNTER — Encounter: Payer: Self-pay | Admitting: General Surgery

## 2017-09-15 ENCOUNTER — Ambulatory Visit (INDEPENDENT_AMBULATORY_CARE_PROVIDER_SITE_OTHER): Payer: 59 | Admitting: General Surgery

## 2017-09-15 VITALS — BP 97/62 | HR 80 | Resp 14 | Ht 67.0 in | Wt 157.0 lb

## 2017-09-15 DIAGNOSIS — R229 Localized swelling, mass and lump, unspecified: Secondary | ICD-10-CM

## 2017-09-15 NOTE — Patient Instructions (Signed)
Leave the areas alone. Call if any changes. Follow up as needed.

## 2017-09-15 NOTE — Progress Notes (Signed)
Patient ID: Michelle Armstrong, female   DOB: July 22, 1961, 56 y.o.   MRN: [id number]  Chief Complaint  Patient presents with  . Other    HPI Michelle Armstrong is a 56 y.o. female here today for an evaluation of bilateral mass on the front of her lower legs. She first noticed these two months ago while applying lotion. She did not notice any change in size.    The patient underwent excision of a lipoma from the left thigh in November 2016.  This measured about 1.5 cm in diameter. Marland Kitchen HPI  Past Medical History:  Diagnosis Date  . 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 HLD (hyperlipidemia)   . Hypothyroidism    possible h/o  . Liver hemangioma    per patient  . Osteopenia 2016, 2018   spine/hip; DEXA at [facility]; 2018-hip  . Osteoporosis 2018   spine; DEXA at [facility]  . 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 02/2010 Echo: EF 55-60%, no rwma.  . Vitamin D deficiency    low    Past Surgical History:  Procedure Laterality Date  . ABDOMINAL HYSTERECTOMY  1999   Total lap hyst BSO due to endometriosis  . CARDIAC ELECTROPHYSIOLOGY [place] AND ABLATION  2011  . COLONOSCOPY WITH PROPOFOL N/A 07/10/2015   Procedure: COLONOSCOPY WITH PROPOFOL;  Surgeon: Lollie Sails, MD;  Location: [facility] ENDOSCOPY;  Service: Endoscopy;  Laterality: N/A;  . GALLBLADDER SURGERY    . HERNIA REPAIR  12/2010  . LEFT HEART CATH AND CORONARY ANGIOGRAPHY N/A 02/24/2017   Procedure: Left Heart Cath and Coronary Angiography;  Surgeon: Wellington Hampshire, MD;  Location: [place] CV LAB;  Service: Cardiovascular;  Laterality: N/A;  . TONSILLECTOMY      Family History  Problem Relation Age of Onset  . Heart disease Father        s/p CABG  . Obesity Father   . Hyperlipidemia Father   . Bladder Cancer Father   . Drug abuse Father   . Thyroid disease Sister   . Clotting disorder Sister        gene mutation  .  Breast cancer Neg Hx     Social History Social History   Tobacco Use  . Smoking status: Never Smoker  . Smokeless tobacco: Never Used  . Tobacco comment: tobacco use- no   Substance Use Topics  . Alcohol use: Yes    Alcohol/week: 0.0 oz    Comment: rare glass of wine.  . Drug use: No    Allergies  Allergen Reactions  . Cephalexin     Rash & dizziness  . Latex     rash  . Metoprolol     REACTION: dizziness, slurred speech  . Nsaids Other (See Comments)    ulcers  . Penicillin G Itching  . Pollen Extract     Current Outpatient Medications  Medication Sig Dispense Refill  . aspirin EC 81 MG tablet Take 1 tablet (81 mg total) by mouth daily. 30 tablet 2  . Calcium Carbonate 500 MG CHEW Chew 1 tablet by mouth daily.    . Multiple Vitamins-Minerals (MULTIVITAMIN PO) Take 1 tablet by mouth daily.     No current facility-administered medications for this visit.     Review of Systems Review of Systems  Constitutional: Negative.   Respiratory: Negative.   Cardiovascular: Negative.  Blood pressure 97/62, pulse 80, resp. rate 14, height 5\' 7"  (1.702 m), weight 157 lb (71.2 kg).  Physical Exam Physical Exam  Constitutional: She is oriented to person, place, and time. She appears well-developed and well-nourished.  Eyes: Conjunctivae are normal. No scleral icterus.  Musculoskeletal:       Legs: Neurological: She is alert and oriented to person, place, and time.  Skin: Skin is warm and dry.  Psychiatric: She has a normal mood and affect.  No lower extremity edema.  Normal distal pulses.  Data Reviewed Bilateral lower extremity venous ultrasound dated September 04, 2017 reviewed.  Normal.  Assessment    Bilateral anterior calf subcutaneous nodules of questionable clinical significance.    Plan    As these areas are asymptomatic, observation alone is warranted.  Follow up as needed    HPI, Physical Exam, Assessment and Plan have been scribed under the  direction and in the presence of Robert Bellow, MD  Concepcion Living, LPN  I have completed the exam and reviewed the above documentation for accuracy and completeness.  I agree with the above.  Haematologist has been used and any errors in dictation or transcription are unintentional.  Hervey Ard, M.D., F.A.C.S.  Robert Bellow 09/16/2017, 12:03 PM

## 2017-09-16 DIAGNOSIS — R229 Localized swelling, mass and lump, unspecified: Secondary | ICD-10-CM | POA: Insufficient documentation

## 2017-09-23 ENCOUNTER — Ambulatory Visit: Payer: 59 | Admitting: General Surgery

## 2017-12-24 ENCOUNTER — Ambulatory Visit: Payer: 59 | Admitting: Podiatry

## 2017-12-24 ENCOUNTER — Encounter: Payer: Self-pay | Admitting: Podiatry

## 2017-12-24 ENCOUNTER — Ambulatory Visit (INDEPENDENT_AMBULATORY_CARE_PROVIDER_SITE_OTHER): Payer: Managed Care, Other (non HMO)

## 2017-12-24 DIAGNOSIS — M778 Other enthesopathies, not elsewhere classified: Secondary | ICD-10-CM

## 2017-12-24 DIAGNOSIS — M779 Enthesopathy, unspecified: Secondary | ICD-10-CM

## 2017-12-24 NOTE — Progress Notes (Signed)
Subjective:  Patient ID: Michelle Armstrong, female    DOB: 10-01-1960,  MRN: [id number] HPI Chief Complaint  Patient presents with  . Foot Pain    Arch/toes bilateral - cramping in toes and tenderness in arch x several months, 2nd toe is culring under causing tip of toe to have thick nail and callused skin, she wants options but not surgical options, wants a general foot exam  . New Patient (Initial Visit)    57 y.o. female presents with the above complaint.   ROS: Denies fever chills nausea vomiting muscle aches pain chest pain shortness of breath calf pain headache.  Past Medical History:  Diagnosis Date  . 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 HLD (hyperlipidemia)   . Hypothyroidism    possible h/o  . Liver hemangioma    per patient  . Osteopenia 2016, 2018   spine/hip; [facility]EXA at [facility]; 2018-hip  . Osteoporosis 2018   spine; DEXA at [facility]  . 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 02/2010 Echo: EF 55-60%, no rwma.  . Vitamin D deficiency    low   Past Surgical History:  Procedure Laterality Date  . ABDOMINAL HYSTERECTOMY  1999   Total lap hyst BSO due to endometriosis  . CARDIAC ELECTROPHYSIOLOGY [place] AND ABLATION  2011  . COLONOSCOPY WITH PROPOFOL N/A 07/10/2015   Procedure: COLONOSCOPY WITH PROPOFOL;  Surgeon: Lollie Sails, MD;  Location: [facility] ENDOSCOPY;  Service: Endoscopy;  Laterality: N/A;  . GALLBLADDER SURGERY    . HERNIA REPAIR  12/2010  . LEFT HEART CATH AND CORONARY ANGIOGRAPHY N/A 02/24/2017   Procedure: Left Heart Cath and Coronary Angiography;  Surgeon: Wellington Hampshire, MD;  Location: [place] CV LAB;  Service: Cardiovascular;  Laterality: N/A;  . TONSILLECTOMY      Current Outpatient Medications:  .  aspirin EC 81 MG tablet, Take 1 tablet (81 mg total) by mouth daily., Disp: 30 tablet, Rfl: 2 .  Calcium Carbonate 500 MG CHEW, Chew 1 tablet by mouth  daily., Disp: , Rfl:  .  Multiple Vitamins-Minerals (MULTIVITAMIN PO), Take 1 tablet by mouth daily., Disp: , Rfl:   Allergies  Allergen Reactions  . Cephalexin     Rash & dizziness  . Latex     rash  . Metoprolol     REACTION: dizziness, slurred speech  . Nsaids Other (See Comments)    ulcers  . Penicillin G Itching  . Pollen Extract    Review of Systems Objective:  There were no vitals filed for this visit.  General: Well developed, nourished, in no acute distress, alert and oriented x3   Dermatological: Skin is warm, dry and supple bilateral. Nails x 10 are well maintained; remaining integument appears unremarkable at this time. There are no open sores, no preulcerative lesions, no rash or signs of infection present.  Reactive hyperkeratosis plantar aspect of the left heel distal aspect of the second toe left.  Vascular: Dorsalis Pedis artery and Posterior Tibial artery pedal pulses are 2/4 bilateral with immedate capillary fill time. Pedal hair growth present. No varicosities and no lower extremity edema present bilateral.   Neruologic: Grossly intact via light touch bilateral. Vibratory intact via tuning fork bilateral. Protective threshold with Semmes Wienstein monofilament intact to all pedal sites bilateral. Patellar and Achilles deep tendon reflexes 2+ bilateral. No Babinski or clonus noted bilateral.   Musculoskeletal:  No gross boney pedal deformities bilateral. No pain, crepitus, or limitation noted with foot and ankle range of motion bilateral. Muscular strength 5/5 in all groups tested bilateral.  Hallux abductovalgus deformities with full range of motion without crepitation left foot.  Tenderness bunion deformities left foot.  Hammertoe deformity flexible in nature second digit left foot.  Right foot appears to be relatively normal Gait: Unassisted, Nonantalgic.    Radiographs:  No acute findings.  Elongated second metatarsals resulting in minimal hammertoe deformity  second digit left foot.  Mild hallux valgus deformities and tailor's bunion deformity left.  Assessment & Plan:   Assessment: Digital deformities with hallux valgus deformities tailor bunion deformities hammertoe deformities.  Anatomical abnormalities of the left foot resulting in some plantar fasciitis and soft tissue spasms.  Reactive hyperkeratosis distal aspect of the second hammertoe left foot.  No dystrophy also.  Plan: Discussed appropriate shoe gear stretching exercises ice therapy as your modifications debridement options for reactive hyperkeratosis and nails.  Discussed the need for surgical intervention.  Placed silicone pad to the distal aspect of the second toe left foot.     Max T. [place], [place]

## 2018-01-19 ENCOUNTER — Telehealth: Payer: Self-pay

## 2018-01-19 ENCOUNTER — Other Ambulatory Visit: Payer: Self-pay | Admitting: Obstetrics and Gynecology

## 2018-01-19 DIAGNOSIS — Z1231 Encounter for screening mammogram for malignant neoplasm of breast: Secondary | ICD-10-CM

## 2018-01-19 NOTE — Telephone Encounter (Signed)
Pt has annual scheduled for 02/19/2018. She would like to go ahead and have labs done if [facility] could put orders in so she could do this by Friday morning. Please advise and call pt, she states ok to leave detailed message

## 2018-01-19 NOTE — Telephone Encounter (Signed)
Please advise. Thank you

## 2018-01-20 NOTE — Telephone Encounter (Signed)
Pt stated she would like her thyroid check and she will be coming into the office for lab work on 01/29/18.

## 2018-01-20 NOTE — Telephone Encounter (Signed)
Pls let pt know Friday AM labs are fine. Is she coming here or elsewhere for labs? If here, she needs to go ahead and be put on lab schedule. Also, does she want thyroid checked. Has been having it done by other MD so not sure if she wants [place] to check it or not. Thx.

## 2018-01-23 ENCOUNTER — Other Ambulatory Visit: Payer: Managed Care, Other (non HMO)

## 2018-01-23 ENCOUNTER — Ambulatory Visit
Admission: RE | Admit: 2018-01-23 | Discharge: 2018-01-23 | Disposition: A | Discharge status: Home / Self Care | Payer: Managed Care, Other (non HMO) | Source: Ambulatory Visit | Attending: Obstetrics and Gynecology | Admitting: Obstetrics and Gynecology

## 2018-01-23 DIAGNOSIS — Z13220 Encounter for screening for lipoid disorders: Secondary | ICD-10-CM

## 2018-01-23 DIAGNOSIS — D508 Other iron deficiency anemias: Secondary | ICD-10-CM

## 2018-01-23 DIAGNOSIS — E559 Vitamin D deficiency, unspecified: Secondary | ICD-10-CM

## 2018-01-23 DIAGNOSIS — Z0000 Encounter for general adult medical examination without abnormal findings: Secondary | ICD-10-CM

## 2018-01-23 DIAGNOSIS — E039 Hypothyroidism, unspecified: Secondary | ICD-10-CM

## 2018-01-23 DIAGNOSIS — E038 Other specified hypothyroidism: Secondary | ICD-10-CM

## 2018-01-23 DIAGNOSIS — Z1231 Encounter for screening mammogram for malignant neoplasm of breast: Secondary | ICD-10-CM

## 2018-01-23 DIAGNOSIS — D649 Anemia, unspecified: Secondary | ICD-10-CM

## 2018-01-23 DIAGNOSIS — Z131 Encounter for screening for diabetes mellitus: Secondary | ICD-10-CM

## 2018-01-24 LAB — LIPID PANEL
CHOL/HDL RATIO: 2.1 ratio (ref 0.0–4.4)
Cholesterol, Total: 223 mg/dL — ABNORMAL HIGH (ref 100–199)
HDL: 104 mg/dL (ref >39)
LDL CALC: 110 mg/dL — AB (ref 0–99)
TRIGLYCERIDES: 45 mg/dL (ref 0–149)
VLDL Cholesterol Cal: 9 mg/dL (ref 5–40)

## 2018-01-24 LAB — CBC WITH DIFFERENTIAL/PLATELET
BASOS: 2 %
Basophils Absolute: 0.1 10*3/uL (ref 0.0–0.2)
EOS (ABSOLUTE): 0.1 10*3/uL (ref 0.0–0.4)
Eos: 2 %
HEMATOCRIT: 40.5 % (ref 34.0–46.6)
HEMOGLOBIN: 13.2 g/dL (ref 11.1–15.9)
IMMATURE GRANS (ABS): 0 10*3/uL (ref 0.0–0.1)
Immature Granulocytes: 0 %
LYMPHS ABS: 1.7 10*3/uL (ref 0.7–3.1)
Lymphs: 40 %
MCH: 28.9 pg (ref 26.6–33.0)
MCHC: 32.6 g/dL (ref 31.5–35.7)
MCV: 89 fL (ref 79–97)
Monocytes Absolute: 0.3 10*3/uL (ref 0.1–0.9)
Monocytes: 6 %
NEUTROS ABS: 2.2 10*3/uL (ref 1.4–7.0)
NEUTROS PCT: 50 %
Platelets: 255 10*3/uL (ref 150–379)
RBC: 4.57 x10E6/uL (ref 3.77–5.28)
RDW: 14.3 % (ref 12.3–15.4)
WBC: 4.3 10*3/uL (ref 3.4–10.8)

## 2018-01-24 LAB — COMPREHENSIVE METABOLIC PANEL
A/G RATIO: 2.2 (ref 1.2–2.2)
ALBUMIN: 4.6 g/dL (ref 3.5–5.5)
ALT: 25 IU/L (ref 0–32)
AST: 29 IU/L (ref 0–40)
Alkaline Phosphatase: 82 IU/L (ref 39–117)
BUN / CREAT RATIO: 19 (ref 9–23)
BUN: 14 mg/dL (ref 6–24)
Bilirubin Total: 0.8 mg/dL (ref 0.0–1.2)
CHLORIDE: 106 mmol/L (ref 96–106)
CO2: 24 mmol/L (ref 20–29)
Calcium: 9.1 mg/dL (ref 8.7–10.2)
Creatinine, Ser: 0.75 mg/dL (ref 0.57–1.00)
GFR calc Af Amer: 104 mL/min/{1.73_m2} (ref >59)
GFR calc non Af Amer: 90 mL/min/{1.73_m2} (ref >59)
GLOBULIN, TOTAL: 2.1 g/dL (ref 1.5–4.5)
GLUCOSE: 83 mg/dL (ref 65–99)
POTASSIUM: 4.5 mmol/L (ref 3.5–5.2)
Sodium: 143 mmol/L (ref 134–144)
TOTAL PROTEIN: 6.7 g/dL (ref 6.0–8.5)

## 2018-01-24 LAB — HEMOGLOBIN A1C
Est. average glucose Bld gHb Est-mCnc: 111 mg/dL
Hgb A1c MFr Bld: 5.5 % (ref 4.8–5.6)

## 2018-01-24 LAB — VITAMIN D 25 HYDROXY (VIT D DEFICIENCY, FRACTURES): Vit D, 25-Hydroxy: 45.6 ng/mL (ref 30.0–100.0)

## 2018-01-24 LAB — IRON AND TIBC
Iron Saturation: 22 % (ref 15–55)
Iron: 82 ug/dL (ref 27–159)
Total Iron Binding Capacity: 376 ug/dL (ref 250–450)
UIBC: 294 ug/dL (ref 131–425)

## 2018-01-24 LAB — THYROID PANEL WITH TSH
FREE THYROXINE INDEX: 1.7 (ref 1.2–4.9)
T3 UPTAKE RATIO: 26 % (ref 24–39)
T4 TOTAL: 6.5 ug/dL (ref 4.5–12.0)
TSH: 1.9 u[IU]/mL (ref 0.450–4.500)

## 2018-01-24 LAB — B12 AND FOLATE PANEL
Folate: >20 ng/mL (ref >3.0)
VITAMIN B 12: 1193 pg/mL (ref 232–1245)

## 2018-01-24 LAB — MAGNESIUM: Magnesium: 2.2 mg/dL (ref 1.6–2.3)

## 2018-01-24 LAB — AMYLASE: Amylase: 76 U/L (ref 31–124)

## 2018-01-24 LAB — FERRITIN: Ferritin: 19 ng/mL (ref 15–150)

## 2018-01-29 ENCOUNTER — Other Ambulatory Visit: Payer: Managed Care, Other (non HMO)

## 2018-02-18 ENCOUNTER — Other Ambulatory Visit: Payer: Managed Care, Other (non HMO)

## 2018-02-18 ENCOUNTER — Telehealth: Payer: Self-pay | Admitting: Obstetrics and Gynecology

## 2018-02-18 DIAGNOSIS — E039 Hypothyroidism, unspecified: Secondary | ICD-10-CM

## 2018-02-18 DIAGNOSIS — Z0184 Encounter for antibody response examination: Secondary | ICD-10-CM

## 2018-02-18 NOTE — Telephone Encounter (Signed)
She can come in today. Pls put her on lab schedule so I can put in order. Let me know when. Thx.

## 2018-02-18 NOTE — Telephone Encounter (Signed)
Patient is schedule for lab at 4:15 today

## 2018-02-18 NOTE — Telephone Encounter (Signed)
Patient has her annual scheduled for tomorrow.  Her work, Psychologist, forensic, is requiring a MMR.  Can you order the lab work?  Please have CMA call patient when order is entered and to verify if patient can come this afternoon.

## 2018-02-19 ENCOUNTER — Encounter: Payer: Self-pay | Admitting: Obstetrics and Gynecology

## 2018-02-19 ENCOUNTER — Ambulatory Visit (INDEPENDENT_AMBULATORY_CARE_PROVIDER_SITE_OTHER): Payer: Managed Care, Other (non HMO) | Admitting: Obstetrics and Gynecology

## 2018-02-19 VITALS — BP 118/72 | HR 85 | Ht 67.0 in | Wt 157.0 lb

## 2018-02-19 DIAGNOSIS — Z124 Encounter for screening for malignant neoplasm of cervix: Secondary | ICD-10-CM | POA: Diagnosis not present

## 2018-02-19 DIAGNOSIS — Z01419 Encounter for gynecological examination (general) (routine) without abnormal findings: Secondary | ICD-10-CM

## 2018-02-19 DIAGNOSIS — M818 Other osteoporosis without current pathological fracture: Secondary | ICD-10-CM | POA: Diagnosis not present

## 2018-02-19 DIAGNOSIS — Z1151 Encounter for screening for human papillomavirus (HPV): Secondary | ICD-10-CM

## 2018-02-19 DIAGNOSIS — M810 Age-related osteoporosis without current pathological fracture: Secondary | ICD-10-CM | POA: Insufficient documentation

## 2018-02-19 DIAGNOSIS — Z1231 Encounter for screening mammogram for malignant neoplasm of breast: Secondary | ICD-10-CM

## 2018-02-19 DIAGNOSIS — Z1239 Encounter for other screening for malignant neoplasm of breast: Secondary | ICD-10-CM

## 2018-02-19 LAB — T4, FREE: Free T4: 1.23 ng/dL (ref 0.82–1.77)

## 2018-02-19 LAB — THYROGLOBULIN ANTIBODY

## 2018-02-19 LAB — THYROID PEROXIDASE ANTIBODY: Thyroperoxidase Ab SerPl-aCnc: 12 IU/mL (ref 0–34)

## 2018-02-19 LAB — T3, FREE: T3 FREE: 2.6 pg/mL (ref 2.0–4.4)

## 2018-02-19 LAB — TSH: TSH: 1.91 u[IU]/mL (ref 0.450–4.500)

## 2018-02-19 NOTE — Progress Notes (Signed)
Chief Complaint  Patient presents with  . Gynecologic Exam     HPI:      [name] is a 57 y.o. G2P0011 who LMP was No LMP recorded. Patient has had a hysterectomy., presents today for her annual examination.  Her menses are absent due to total lap hyst BSO due to endometriosis 1999. She does not have intermenstrual bleeding.  Sex activity: single partner, contraception - status post hysterectomy. She does have vaginal dryness, improved with ThermaVi procedure. She uses lubricants with some relief.   Last Pap: 04/14/17  Results were: no abnormalities. Neg HPV DNA 6/16.  Pt likes yearly paps. Hx of STDs: none  Last mammogram: 01/23/18  Results were: normal--routine follow-up in 12 months There is no FH of breast cancer. There is no FH of ovarian cancer. The patient does do self-breast exams.  Colonoscopy: colonoscopy 3 years ago without abnormalities. Repeat due in 5 yrs.  DEXA: 2018 at [facility]. 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 Due for repeat 2020. Taking ca/Vit D.  Tobacco use: The patient denies current or previous tobacco use. Alcohol use: none Exercise: moderately active  She does get adequate calcium and Vitamin D in her diet. Recent labs WNL. Pt taking MVI with iron.    Past Medical History:  Diagnosis Date  . 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 HLD (hyperlipidemia)   . Hypothyroidism    possible h/o  . Liver hemangioma    per patient  . Osteopenia 2016, 2018   spine/hip; DEXA at [facility]; 2018-hip  . Osteoporosis 2018   spine; D[facility]A at [facility]  . 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 02/2010 Echo: EF 55-60%, no rwma.  . Vitamin D deficiency    low    Past Surgical History:  Procedure Laterality Date  . ABDOMINAL HYSTERECTOMY  1999   Total lap hyst BSO due to endometriosis  . CARDIAC ELECTROPHYSIOLOGY [place] AND ABLATION  2011  .  COLONOSCOPY WITH PROPOFOL N/A 07/10/2015   Procedure: COLONOSCOPY WITH PROPOFOL;  Surgeon: Lollie Sails, MD;  Location: [facility] ENDOSCOPY;  Service: Endoscopy;  Laterality: N/A;  . GALLBLADDER SURGERY    . HERNIA REPAIR  12/2010  . LEFT HEART CATH AND CORONARY ANGIOGRAPHY N/A 02/24/2017   Procedure: Left Heart Cath and Coronary Angiography;  Surgeon: Wellington Hampshire, MD;  Location: [facility];  Service: Cardiovascular;  Laterality: N/A;  . TONSILLECTOMY      Family History  Problem Relation Age of Onset  . Heart disease Father        s/p CABG  . Obesity Father   . Hyperlipidemia Father   . Bladder Cancer Father   . Drug abuse Father   . Thyroid disease Sister   . Clotting disorder Sister        gene mutation  . Breast cancer Neg Hx     Social History   Socioeconomic History  . Marital status: Married    Spouse name: Not on file  . Number of children: 1  . Years of education: Not on file  . Highest education level: Not on file  Occupational History  . Occupation: Armed forces technical officer: [employer]  Social Needs  . Financial resource strain: Not on file  . Food insecurity:    Worry: Not on file    Inability:  Not on file  . Transportation needs:    Medical: Not on file    Non-medical: Not on file  Tobacco Use  . Smoking status: Never Smoker  . Smokeless tobacco: Never Used  . Tobacco comment: tobacco use- no   Substance and Sexual Activity  . Alcohol use: Yes    Alcohol/week: 0.0 oz    Comment: rare glass of wine.  . Drug use: No  . Sexual activity: Yes    Birth control/protection: Post-menopausal  Lifestyle  . Physical activity:    Days per week: Not on file    Minutes per session: Not on file  . Stress: Not on file  Relationships  . Social connections:    Talks on phone: Not on file    Gets together: Not on file    Attends religious service: Not on file    Active member of club or organization: Not on file    Attends meetings of clubs  or organizations: Not on file    Relationship status: Not on file  . Intimate partner violence:    Fear of current or ex partner: Not on file    Emotionally abused: Not on file    Physically abused: Not on file    Forced sexual activity: Not on file  Other Topics Concern  . Not on file  Social History Narrative   Lives locally with husband.  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    Current Outpatient Medications on File Prior to Visit  Medication Sig Dispense Refill  . amoxicillin (AMOXIL) 500 MG capsule TAKE 1 CAPSULE BY MOUTH EVERY 8 HOURS UNTIL GONE  1  . Ascorbic Acid (VITAMIN C) 1000 MG tablet Take 1,000 mg by mouth daily.    Marland Kitchen aspirin EC 81 MG tablet Take 1 tablet (81 mg total) by mouth daily. 30 tablet 2  . Calcium Carbonate 500 MG CHEW Chew 1 tablet by mouth daily.    . cholecalciferol (VITAMIN D) 1000 units tablet Take by mouth daily.    . Ferrous Gluconate-C-Folic Acid (IRON-C PO) Take by mouth.    . Multiple Vitamins-Minerals (MULTIVITAMIN PO) Take 1 tablet by mouth daily.    . Thiamine HCl (VITAMIN B-1 PO) Take 12.5 mg by mouth.     No current facility-administered medications on file prior to visit.       ROS:  Review of Systems  Constitutional: Negative for fatigue, fever and unexpected weight change.  Respiratory: Negative for cough, shortness of breath and wheezing.   Cardiovascular: Negative for chest pain, palpitations and leg swelling.  Gastrointestinal: Negative for blood in stool, constipation, diarrhea, nausea and vomiting.  Endocrine: Negative for cold intolerance, heat intolerance and polyuria.  Genitourinary: Negative for dyspareunia, dysuria, flank pain, frequency, genital sores, hematuria, menstrual problem, pelvic pain, urgency, vaginal bleeding, vaginal discharge and vaginal pain.  Musculoskeletal: Negative for back pain, joint swelling and myalgias.  Skin: Negative for rash.  Neurological: Negative for dizziness, syncope,  light-headedness, numbness and headaches.  Hematological: Negative for adenopathy.  Psychiatric/Behavioral: Negative for agitation, confusion, sleep disturbance and suicidal ideas. The patient is not nervous/anxious.      Objective: BP 118/72   Pulse 85   Ht 5\' 7"  (1.702 m)   Wt 157 lb (71.2 kg)   BMI 24.59 kg/m    Physical Exam  Constitutional: She is oriented to person, place, and time. She appears well-developed and well-nourished.  Genitourinary: Vagina normal. There is no rash or tenderness on the right labia.  There is no rash or tenderness on the left labia. No erythema or tenderness in the vagina. No vaginal discharge found. Right adnexum does not display mass and does not display tenderness. Left adnexum does not display mass and does not display tenderness.  Genitourinary Comments: UTERUS/CX SURG REM  Neck: Normal range of motion. No thyromegaly present.  Cardiovascular: Normal rate, regular rhythm and normal heart sounds.  No murmur heard. Pulmonary/Chest: Effort normal and breath sounds normal. Right breast exhibits no mass, no nipple discharge, no skin change and no tenderness. Left breast exhibits no mass, no nipple discharge, no skin change and no tenderness.  Abdominal: Soft. There is no tenderness. There is no guarding.  Musculoskeletal: Normal range of motion.  Neurological: She is alert and oriented to person, place, and time. No cranial nerve deficit.  Psychiatric: She has a normal mood and affect. Her behavior is normal.  Vitals reviewed.   Assessment/Plan: Encounter for annual routine gynecological examination  Cervical cancer screening - Plan: IGP, Aptima HPV  Screening for HPV (human papillomavirus) - Plan: IGP, Aptima HPV  Screening for breast cancer - Pt current on mammo.          GYN counsel breast self exam, mammography screening, menopause, adequate intake of calcium and vitamin D, diet and exercise     F/U  Return in about 1 year (around  02/20/2019).   B. , PA-C 02/19/2018 9:58 AM

## 2018-02-19 NOTE — Patient Instructions (Signed)
I value your feedback and entrusting us with your care. If you get a [facility] patient survey, I would appreciate you taking the time to let us know about your experience today. Thank you! 

## 2018-02-21 LAB — MEASLES/MUMPS/RUBELLA IMMUNITY: MUMPS ABS, IGG: <9 AU/mL — ABNORMAL LOW (ref >10.9)

## 2018-02-21 LAB — SPECIMEN STATUS REPORT

## 2018-02-24 ENCOUNTER — Telehealth: Payer: Self-pay | Admitting: Obstetrics and Gynecology

## 2018-02-24 ENCOUNTER — Telehealth: Payer: Self-pay | Admitting: *Deleted

## 2018-02-24 LAB — IGP, APTIMA HPV
HPV Aptima: NEGATIVE
PAP Smear Comment: 0

## 2018-02-24 NOTE — Telephone Encounter (Signed)
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

## 2018-02-24 NOTE — Telephone Encounter (Signed)
Changed PCP as instructed back to Dr. Caryl Bis, tried to reach patient and advise PCP wishes for patient to receive MMR from Health Dept. [place] message for patient to call office. PEC may advise.

## 2018-02-24 NOTE — Telephone Encounter (Signed)
Pt aware of MMR results. Doesn't have immunity. F/u with PCP/urgent care for MMR booster. Will pick up hard copy at front desk.

## 2018-02-24 NOTE — Telephone Encounter (Signed)
Pt returned call, made her aware per PCP, pt would like to know why? Pt would like for Juliann Pulse to return call. Pt is available at work but if unable to reach she would like to be tried on her cell.    Please assist further

## 2018-02-24 NOTE — Telephone Encounter (Signed)
It is okay for her to get this in the office.  She will need 2 doses of the MMR vaccine separated by at least 28 days. Thanks.  

## 2018-02-24 NOTE — Telephone Encounter (Signed)
Please advise 

## 2018-02-24 NOTE — Telephone Encounter (Signed)
Patient called and stated work required her to have a MMR titer and her came back low, she needs and MMR the titer is in chart. Patient saw PCP last 09/04/17 her husband is also a patient some how PCP was removed from chart do you recall being PCP this lady stated you did not require an establish care visit because she only seen maybe once a year.

## 2018-02-24 NOTE — Telephone Encounter (Signed)
I believe I was supposed to be her PCP. I would suggest she get the MMR vaccine through the health department.

## 2018-02-26 NOTE — Telephone Encounter (Signed)
Patient scheduled.

## 2018-03-04 ENCOUNTER — Ambulatory Visit (INDEPENDENT_AMBULATORY_CARE_PROVIDER_SITE_OTHER): Payer: Managed Care, Other (non HMO)

## 2018-03-04 DIAGNOSIS — Z23 Encounter for immunization: Secondary | ICD-10-CM | POA: Diagnosis not present

## 2018-03-04 NOTE — Progress Notes (Signed)
Pt came in today and was given MMR vaccine in L arm. Patient tolerated well, no complaints.

## 2018-03-04 NOTE — Progress Notes (Signed)
I have reviewed the above note and agree.  Eric Sonnenberg, M.D.  

## 2018-03-05 ENCOUNTER — Ambulatory Visit: Payer: Managed Care, Other (non HMO)

## 2018-03-23 ENCOUNTER — Telehealth: Payer: Self-pay | Admitting: *Deleted

## 2018-03-23 DIAGNOSIS — M79672 Pain in left foot: Secondary | ICD-10-CM

## 2018-03-23 NOTE — Telephone Encounter (Signed)
Yes,  She can coe in early for the x ray

## 2018-03-23 NOTE — Telephone Encounter (Signed)
Patient will wait for evaluation, coming into tomorrow at 5 Dr. Derrel Nip please see below patient has tripped on trampoline has 2 discolored toes using ice and elevation but still blue and throbbing with pain, would patient need to come earlier for X-ray

## 2018-03-23 NOTE — Telephone Encounter (Signed)
Patient hit toe on a trampoline is purple in color is pounding, and swelling, has tried ice and elevation but toe is no better. Scheduled with Dr. Derrel Nip for tomorrow, but patient has to work and would like a script for a shoe to help protect foot . Tried to get patient to go to [facility] refused , refused UC or another office.

## 2018-03-23 NOTE — Telephone Encounter (Signed)
Copied from [place] [id number]. Topic: Inquiry >> Mar 23, 2018  2:26 PM Pricilla Handler wrote: Reason for CRM: Patient called requesting if she can be worked in to see a provider for a Left Foot Pinky Toe Injury she incurred last night. Patient is in a lot of pain, and does not want to visit any other [facility] office. Please call and advise.       Thank You!!!

## 2018-03-23 NOTE — Telephone Encounter (Signed)
She will need to be evaluated to determine the appropriate treatment.

## 2018-03-24 ENCOUNTER — Other Ambulatory Visit: Payer: Self-pay | Admitting: Obstetrics and Gynecology

## 2018-03-24 ENCOUNTER — Ambulatory Visit (INDEPENDENT_AMBULATORY_CARE_PROVIDER_SITE_OTHER): Payer: Managed Care, Other (non HMO)

## 2018-03-24 ENCOUNTER — Ambulatory Visit: Payer: Managed Care, Other (non HMO) | Admitting: Internal Medicine

## 2018-03-24 ENCOUNTER — Other Ambulatory Visit: Payer: Self-pay

## 2018-03-24 ENCOUNTER — Encounter: Payer: Self-pay | Admitting: Internal Medicine

## 2018-03-24 ENCOUNTER — Telehealth: Payer: Self-pay | Admitting: Obstetrics and Gynecology

## 2018-03-24 DIAGNOSIS — S99922A Unspecified injury of left foot, initial encounter: Secondary | ICD-10-CM | POA: Diagnosis not present

## 2018-03-24 DIAGNOSIS — S93492A Sprain of other ligament of left ankle, initial encounter: Secondary | ICD-10-CM

## 2018-03-24 DIAGNOSIS — M79672 Pain in left foot: Secondary | ICD-10-CM | POA: Diagnosis not present

## 2018-03-24 MED ORDER — FLUCONAZOLE 150 MG PO TABS: 150.0000 mg | ORAL_TABLET | Freq: Once | ORAL | 0 refills | Status: AC

## 2018-03-24 NOTE — Telephone Encounter (Signed)
Patient needs a prescription of Diflucan.  She tried the over the counter things that we recommended with no relief.

## 2018-03-24 NOTE — Patient Instructions (Signed)
There is no fracture reported on the films we ran today , but we should repeat them in  a week   I am recommending that you use tylenol up to 4000 mg daily in divided doses  ,  And ice/elevate the foot as much as possible    A short CAM walker is the boot I recommend you wear until we can repeat your x rays

## 2018-03-24 NOTE — Progress Notes (Signed)
Rx diflucan for yeast vag 

## 2018-03-24 NOTE — Telephone Encounter (Signed)
Pt aware.

## 2018-03-24 NOTE — Progress Notes (Signed)
Subjective:  Patient ID: Michelle Armstrong, female    DOB: 1960/11/02  Age: 57 y.o. MRN: [id number]  CC: Diagnoses of Foot injury, left, initial encounter and Sprain of other ligament of left ankle, initial encounter were pertinent to this visit.  HPI Michelle Armstrong presents for evaluation of left foot pain after injuring it on a trampoline several days ago.    Outpatient Medications Prior to Visit  Medication Sig Dispense Refill  . amoxicillin (AMOXIL) 500 MG capsule TAKE 1 CAPSULE BY MOUTH EVERY 8 HOURS UNTIL GONE  1  . Ascorbic Acid (VITAMIN C) 1000 MG tablet Take 1,000 mg by mouth daily.    . Calcium Carbonate 500 MG CHEW Chew 1 tablet by mouth daily.    . cholecalciferol (VITAMIN D) 1000 units tablet Take by mouth daily.    . Ferrous Gluconate-C-Folic Acid (IRON-C PO) Take by mouth.    . fluconazole (DIFLUCAN) 150 MG tablet Take 1 tablet (150 mg total) by mouth once for 1 dose. 1 tablet 0  . Multiple Vitamins-Minerals (MULTIVITAMIN PO) Take 1 tablet by mouth daily.    . Thiamine HCl (VITAMIN B-1 PO) Take 12.5 mg by mouth.     No facility-administered medications prior to visit.     Review of Systems;  Patient denies headache, fevers, malaise, unintentional weight loss, skin rash, eye pain, sinus congestion and sinus pain, sore throat, dysphagia,  hemoptysis , cough, dyspnea, wheezing, chest pain, palpitations, orthopnea, edema, abdominal pain, nausea, melena, diarrhea, constipation, flank pain, dysuria, hematuria, urinary  Frequency, nocturia, numbness, tingling, seizures,  Focal weakness, Loss of consciousness,  Tremor, insomnia, depression, anxiety, and suicidal ideation.      Objective:  BP 94/82 (BP Location: Left Arm, Patient Position: Sitting, Cuff Size: Normal)   Pulse 74   Temp 98.7 F (37.1 C)   Wt 157 lb (71.2 kg)   SpO2 96%   BMI 24.59 kg/m   BP Readings from Last 3 Encounters:  03/24/18 94/82  02/19/18 118/72  09/15/17 97/62    Wt Readings  from Last 3 Encounters:  03/24/18 157 lb (71.2 kg)  02/19/18 157 lb (71.2 kg)  09/15/17 157 lb (71.2 kg)    General appearance: alert, cooperative and appears stated age  Back: symmetric, no curvature. ROM normal. No CVA tenderness. Lungs: clear to auscultation bilaterally Heart: regular rate and rhythm, S1, S2 normal, no murmur, click, rub or gallop Abdomen: soft, non-tender; bowel sounds normal; no masses,  no organomegaly Pulses: 2+ and symmetric Skin: Skin color, texture, turgor normal. No rashes or lesions Lymph nodes: Cervical, supraclavicular, and axillary nodes normal. Ext: left foot with bruising and swelling involving the 5th toe and 5th metatarsal.  No pain over lateral malleolus   Lab Results  Component Value Date   HGBA1C 5.5 01/23/2018   HGBA1C 5.3 02/23/2017   HGBA1C 5.6 01/24/2017    Lab Results  Component Value Date   CREATININE 0.75 01/23/2018   CREATININE 0.54 02/24/2017   CREATININE 0.53 02/23/2017    Lab Results  Component Value Date   WBC 4.3 01/23/2018   HGB 13.2 01/23/2018   HCT 40.5 01/23/2018   PLT 255 01/23/2018   GLUCOSE 83 01/23/2018   CHOL 223 (H) 01/23/2018   TRIG 45 01/23/2018   HDL 104 01/23/2018   LDLCALC 110 (H) 01/23/2018   ALT 25 01/23/2018   AST 29 01/23/2018   NA 143 01/23/2018   K 4.5 01/23/2018   CL 106 01/23/2018   CREATININE 0.75 01/23/2018  BUN 14 01/23/2018   CO2 24 01/23/2018   TSH 1.910 02/18/2018   INR 1.10 02/24/2017   HGBA1C 5.5 01/23/2018    Mm 3d Screen Breast Bilateral  Result Date: 01/26/2018 CLINICAL DATA:  Screening. EXAM: DIGITAL SCREENING BILATERAL MAMMOGRAM WITH TOMO AND CAD COMPARISON:  Previous exam(s). ACR Breast Density Category b: There are scattered areas of fibroglandular density. FINDINGS: There are no findings suspicious for malignancy. Images were processed with CAD. IMPRESSION: No mammographic evidence of malignancy. A result letter of this screening mammogram will be mailed directly to the  patient. RECOMMENDATION: Screening mammogram in one year. (Code:SM-B-01Y) BI-RADS CATEGORY  1: Negative. Electronically Signed   By: Lajean Manes M.D.   On: 01/26/2018 09:39    Assessment & Plan:   Problem List Items Addressed This Visit    Foot injury, left, initial encounter    Plain films were negative for MT fracture ,  Will treat for ankle sprain and immbolize foot in a short CAM walker to be supplied by [facility]       Left ankle sprain    She has mild swelling without bruising.  Left lateral foot is tender and bruised.  Short CAM walker,  Ice elevation,  Repeat x rays one week        A total of 40 minutes was spent with patient more than half of which was spent in counseling patient on the above mentioned issues , reviewing and explaining recent labs and imaging studies done, and coordination of care.   I am having Michelle Armstrong "Michelle Armstrong" maintain her Calcium Carbonate, Multiple Vitamins-Minerals (MULTIVITAMIN PO), amoxicillin, cholecalciferol, Ferrous Gluconate-C-Folic Acid (IRON-C PO), vitamin C, and Thiamine HCl (VITAMIN B-1 PO).  No orders of the defined types were placed in this encounter.   There are no discontinued medications.  Follow-up: No follow-ups on file.   Crecencio Mc, MD

## 2018-03-24 NOTE — Telephone Encounter (Signed)
Do you need to see her?

## 2018-03-24 NOTE — Telephone Encounter (Signed)
Rx diflucan eRxd. RTO for further eval if sx persist. RN to notify pt.

## 2018-03-24 NOTE — Telephone Encounter (Signed)
Coming in at 345.

## 2018-03-25 ENCOUNTER — Telehealth: Payer: Self-pay

## 2018-03-25 DIAGNOSIS — S93402A Sprain of unspecified ligament of left ankle, initial encounter: Secondary | ICD-10-CM | POA: Insufficient documentation

## 2018-03-25 DIAGNOSIS — S99922A Unspecified injury of left foot, initial encounter: Secondary | ICD-10-CM | POA: Insufficient documentation

## 2018-03-25 NOTE — Telephone Encounter (Signed)
CPT codes given [phone]

## 2018-03-25 NOTE — Telephone Encounter (Signed)
Copied from [place] [id number]. Topic: Inquiry >> Mar 23, 2018  2:26 PM Pricilla Handler wrote: Reason for CRM: Patient called requesting if she can be worked in to see a provider for a Left Foot Pinky Toe Injury she incurred last night. Patient is in a lot of pain, and does not want to visit any other  office. Please call and advise.       Thank You!!!  >> Mar 25, 2018  1:20 PM Boyd Kerbs wrote: For [place] Lusty:  She is needing a CPT code for the boot to check with her insurance  Please call her on work phone number

## 2018-03-25 NOTE — Telephone Encounter (Signed)
[place] to inquire about a short cam boot walker and if the patient could come by and get one without being seen, I was informed that was ok, Called and left patient voicemail with details.

## 2018-03-25 NOTE — Assessment & Plan Note (Signed)
Plain films were negative for MT fracture ,  Will treat for ankle sprain and immbolize foot in a short CAM walker to be supplied by [facility]

## 2018-03-25 NOTE — Telephone Encounter (Signed)
[facility] in needing a diagnosis and CPT code so they can bill for the boot the pt got from them.

## 2018-03-25 NOTE — Telephone Encounter (Signed)
Copied from [place] [id number]. Topic: General - Other >> Mar 25, 2018 10:05 AM Boyd Kerbs wrote: Reason for CRM:  Pt. Is asking if can get note from doctor stating will need to use handicap parking at work only for how every long dr. Rozell Searing - she is having a hard time walking far.  Please fax to office Attn: Dr Nancee Liter   fax # [phone] If questions please call

## 2018-03-25 NOTE — Assessment & Plan Note (Signed)
She has mild swelling without bruising.  Left lateral foot is tender and bruised.  Short CAM walker,  Ice elevation,  Repeat x rays one week

## 2018-03-25 NOTE — Telephone Encounter (Signed)
The note has been completed the ICD 10 codes are   S99.922A s93.402A

## 2018-03-25 NOTE — Telephone Encounter (Signed)
Left ankle sprain,  Blunt injury to forefoot

## 2018-03-25 NOTE — Telephone Encounter (Signed)
Please advise 

## 2018-03-25 NOTE — Telephone Encounter (Signed)
Letter has been written and given to your

## 2018-03-26 NOTE — Telephone Encounter (Signed)
Letter was faxed yesterday evening.

## 2018-04-01 ENCOUNTER — Ambulatory Visit (INDEPENDENT_AMBULATORY_CARE_PROVIDER_SITE_OTHER): Payer: Managed Care, Other (non HMO)

## 2018-04-01 DIAGNOSIS — Z23 Encounter for immunization: Secondary | ICD-10-CM

## 2018-04-01 NOTE — Progress Notes (Signed)
Patient comes in for 2nd MMR injection.   Injected left deltoid subcutaneously.  Patient tolerated injection well. Patient is wanting to have titer done. Can you place orders for her to come to office to have done. Thanks.

## 2018-04-04 NOTE — Addendum Note (Signed)
Addended by: Caryl Bis, Pippa Hanif G on: 04/04/2018 11:10 AM   Modules accepted: Orders

## 2018-04-04 NOTE — Progress Notes (Signed)
I have reviewed the above note and agree.  Orders placed for titers.  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  If she has I placed the order to be sent to [facility].  If she is not please let me know.  Tommi Rumps, M.D.

## 2018-04-06 ENCOUNTER — Ambulatory Visit: Payer: Managed Care, Other (non HMO) | Admitting: Podiatry

## 2018-04-06 ENCOUNTER — Encounter: Payer: Self-pay | Admitting: Podiatry

## 2018-04-06 ENCOUNTER — Ambulatory Visit (INDEPENDENT_AMBULATORY_CARE_PROVIDER_SITE_OTHER): Payer: Managed Care, Other (non HMO)

## 2018-04-06 ENCOUNTER — Encounter

## 2018-04-06 ENCOUNTER — Telehealth: Payer: Self-pay | Admitting: *Deleted

## 2018-04-06 DIAGNOSIS — S92505A Nondisplaced unspecified fracture of left lesser toe(s), initial encounter for closed fracture: Secondary | ICD-10-CM

## 2018-04-06 DIAGNOSIS — M779 Enthesopathy, unspecified: Secondary | ICD-10-CM | POA: Diagnosis not present

## 2018-04-06 DIAGNOSIS — M778 Other enthesopathies, not elsewhere classified: Secondary | ICD-10-CM

## 2018-04-06 MED ORDER — METHYLPREDNISOLONE 4 MG PO TBPK: ORAL_TABLET | ORAL | 0 refills | Status: DC

## 2018-04-06 NOTE — Telephone Encounter (Signed)
Three views of foot only not much of ankle on lateral view but from what I can see it looks good.  4th and 5th toes are fractured and non displaced.

## 2018-04-06 NOTE — Progress Notes (Signed)
Spoke to patient and she prefers to come to our office to have done. I scheduled her for 6 weeks from injection to have titer done.

## 2018-04-06 NOTE — Telephone Encounter (Signed)
Patient states she had an xray of her ankle and her foot today and did not mention anything about her ankle xray. Could you comment on what you saw?

## 2018-04-06 NOTE — Progress Notes (Signed)
She presents today for chief complaint of an injury to her lesser toes x2 weeks ago.  Reports that she caught her toe on a pole pole of a trampoline.  She states that she is been pain and ever since with pain radiating up the top of the foot bottom of the foot lateral side and medial side of the foot all the way up to her thigh.  She states anytime she moves her toes she has radiating pains into her ankle and her leg.  She was given the boot and is taking Tylenol for the pain and she is using ice as well.  Objective: Vital signs are stable she is alert and oriented x3.  Pulses are palpable.  Neurologic sensorium is intact degenerative flexors are intact muscle strength is normal symmetrical.  Orthopedic evaluation demonstrates pain on palpation to the third fourth and fifth digits of the left foot and across the lesser metatarsophalangeal joints.  Minimal edema no erythema cellulitis drainage or odor ecchymosis is resolving.  Radiographs taken today demonstrate an osseously mature individual.  It appears to be sure that she has proximal phalangeal fractures of the fourth and fifth digits of the left foot nondisplaced non-comminuted.  Lateral view does not demonstrate any type of ankle arthritis.  Cutaneous evaluation demonstrates supple well-hydrated cutis no erythema cellulitis drainage or odor.  Assessment: Fractured fourth and fifth digits of the left foot with capsulitis and neuritis.  Plan: Discussed etiology pathology conservative surgical therapies at this point I placed her nonsteroidal anti-inflammatory discussed the continued use of the boot follow-up with her in a few weeks.

## 2018-04-06 NOTE — Telephone Encounter (Signed)
Copied from [place] [id number]. Topic: Quick Communication - Office Called Patient >> Apr 06, 2018  2:50 PM Hewitt Shorts wrote: Pt is returning a call from office says to call back on [phone] til four then [phone] and ok to leave a message

## 2018-04-06 NOTE — Progress Notes (Signed)
lmtc

## 2018-04-07 ENCOUNTER — Telehealth: Payer: Self-pay | Admitting: Podiatry

## 2018-04-07 ENCOUNTER — Telehealth: Payer: Self-pay | Admitting: Family Medicine

## 2018-04-07 NOTE — Telephone Encounter (Signed)
I would wait at least two weeks.

## 2018-04-07 NOTE — Telephone Encounter (Signed)
Pt just received MMR vacination and was wondering how long to wait before starting medication that was prescribed. Please call pt at work number [phone]. °

## 2018-04-07 NOTE — Telephone Encounter (Signed)
I will forward our clinical pharmacist to see if she knows if the patient should avoid the Medrol Dosepak while she is in the process of being vaccinated.

## 2018-04-07 NOTE — Telephone Encounter (Signed)
Patient has contacted podiatry but has not heard back, she states she recommends your input over theirs

## 2018-04-07 NOTE — Telephone Encounter (Signed)
Patient states she looked on the Internet and it states it could affect the immunity. She would like to know if there is anything else she can take or if she should wait a certain amount of time.she is also wondering if there is a topical that she could use

## 2018-04-07 NOTE — Telephone Encounter (Signed)
I informed pt of Dr. Stephenie Acres recommendation. Pt states her other question from yesterday was what did he see on the ankle portion of the x-ray.

## 2018-04-07 NOTE — Telephone Encounter (Signed)
FYI

## 2018-04-07 NOTE — Telephone Encounter (Signed)
Dr. Caryl Bis,   The methylprednisolone taper should not affect her immunity! ACIP defines "immunosupression" in regard to live vaccine contraindications as >2 weeks of >20 mg daily or >2 mg/kg prednisone or the equivalent, and she is well under this cut off.   Thanks for reaching out!  Catie Darnelle Maffucci, PharmD PGY2 Ambulatory Care Pharmacy Resident Phone: [phone]

## 2018-04-07 NOTE — Telephone Encounter (Signed)
She should contact the podiatrist to get their input and see if there is anything alternative for her issue.

## 2018-04-07 NOTE — Telephone Encounter (Signed)
fyi

## 2018-04-07 NOTE — Telephone Encounter (Signed)
Copied from [place] [id number]. Topic: Quick Communication - See Telephone Encounter >> Apr 07, 2018  9:19 AM Ahmed Prima L wrote: CRM for notification. See Telephone encounter for: 04/07/18.   Patient said that the podiatrist prescribed her methylPREDNISolone (MEDROL) 4 MG TBPK tablet

## 2018-04-07 NOTE — Telephone Encounter (Signed)
Spoke with patient in regards to having MMR titer done she is scheduled for lab appointment

## 2018-04-07 NOTE — Telephone Encounter (Signed)
Please let the patient know that I checked with our clinical pharmacist regarding the Medrol Dosepak.  She advised that the patient should be able to proceed with the Medrol Dosepak as it  does not meet criteria for immunosuppression based on ACIP guidelines.  It should not affect her immunity.  Thanks.

## 2018-04-07 NOTE — Telephone Encounter (Signed)
FYI, the whole message did not send. Patient called and stated that she has not started the medication due to an increase in her MMR. She said she was just vaccinated and wanted to see what Dr Derrel Nip thought about her starting it because she did not want to be re-vaccinated

## 2018-04-08 NOTE — Telephone Encounter (Signed)
Have not seen her for this issue and thus cannot give adequate advice regarding this.  She should follow-up with podiatry for their input.

## 2018-04-08 NOTE — Telephone Encounter (Signed)
Again, there were no ankle views. Only the lateral and it looked normal.

## 2018-04-08 NOTE — Telephone Encounter (Signed)
Left message informing pt the x-rays were of the foot, and the only x-ray similar to the ankle views would be the lateral view and Dr. Milinda Pointer had said it looked normal, but if continued to have problems with the ankle to make an appt to have ankle x-rays and be evaluated.

## 2018-04-09 ENCOUNTER — Telehealth: Payer: Self-pay | Admitting: *Deleted

## 2018-04-09 MED ORDER — NONFORMULARY OR COMPOUNDED ITEM: 2 refills | Status: DC

## 2018-04-09 NOTE — Telephone Encounter (Signed)
Dr. Milinda Pointer states offer Voltaren gel or [facility] - Antiinflammatory cream. I informed pt and she states she would like to try the [place] cream and not wait on the cream that needed to be pre-certed.other

## 2018-04-09 NOTE — Telephone Encounter (Signed)
Patient notified

## 2018-04-09 NOTE — Telephone Encounter (Signed)
Pt called and thanked me for returning her call yesterday, and request a topical to help with the throbbing pain until she could begin the medrol dose pack.

## 2018-05-22 ENCOUNTER — Other Ambulatory Visit: Payer: Self-pay | Admitting: Radiology

## 2018-05-22 ENCOUNTER — Other Ambulatory Visit (INDEPENDENT_AMBULATORY_CARE_PROVIDER_SITE_OTHER): Payer: Managed Care, Other (non HMO)

## 2018-05-22 DIAGNOSIS — Z23 Encounter for immunization: Secondary | ICD-10-CM | POA: Diagnosis not present

## 2018-05-24 LAB — MEASLES/MUMPS/RUBELLA IMMUNITY
MUMPS ABS, IGG: 90.8 [AU]/ml (ref >10.9)
RUBEOLA AB, IGG: 28.9 [AU]/ml (ref >16.4)
Rubella Antibodies, IGG: 10.8 index (ref >0.99)

## 2018-05-27 ENCOUNTER — Telehealth: Payer: Self-pay | Admitting: Family Medicine

## 2018-05-27 NOTE — Telephone Encounter (Signed)
Called and left voicemail for patient that MMR titers are acceptable.

## 2018-05-27 NOTE — Telephone Encounter (Signed)
Copied from [place] [id number]. Topic: Quick Communication - See Telephone Encounter >> May 27, 2018  3:07 PM Gardiner Ramus wrote: CRM for notification. See Telephone encounter for: 05/27/18. Pt called and stated that she would like labs from 05/22/18 please advice

## 2018-09-14 ENCOUNTER — Ambulatory Visit: Payer: Self-pay

## 2018-09-14 NOTE — Telephone Encounter (Signed)
Pt called with C/O severe sinus pressure, pain over eyes burning in her nose, sore throat, dizziness.  She states that she has not checked her temperature but her husband states she is hot to touch.  She states she is chilled.  She states her son came to town with these symptoms and did see a doctor yesterday. Pt says she has yellow green discharge from her nares. Per protocol pt need to be seen today but is refusing another office, urgent care, and ED.  Office notified and earliest appointments will be Thursday which patient accepted against advice. Pt needs seen today per protocol. Pt states she understands but will wait till Thursday. Care advice read to patient. Pt verbalized understanding of all instructions.  Reason for Disposition . [1] SEVERE pain AND [2] not improved 2 hours after pain medicine  Answer Assessment - Initial Assessment Questions 1. LOCATION: "Where does it hurt?"      Above eyes both sides of nose 2. ONSET: "When did the sinus pain start?"  (e.g., hours, days)      Week ago 3. SEVERITY: "How bad is the pain?"   (Scale 1-10; mild, moderate or severe)   - MILD (1-3): doesn't interfere with normal activities    - MODERATE (4-7): interferes with normal activities (e.g., work or school) or awakens from sleep   - SEVERE (8-10): excruciating pain and patient unable to do any normal activities        9-10 4. RECURRENT SYMPTOM: "Have you ever had sinus problems before?" If so, ask: "When was the last time?" and "What happened that time?"     No 5. NASAL CONGESTION: "Is the nose blocked?" If so, ask, "Can you open it or must you breathe through the mouth?"     Stuffy sore throat  6. NASAL DISCHARGE: "Do you have discharge from your nose?" If so ask, "What color?"    Yellow green 7. FEVER: "Do you have a fever?" If so, ask: "What is it, how was it measured, and when did it start?"      Yes has not checked but is real feverish and hot per husband 8. OTHER SYMPTOMS: "Do you have any  other symptoms?" (e.g., sore throat, cough, earache, difficulty breathing)     Ear pain and pressure 9. PREGNANCY: "Is there any chance you are pregnant?" "When was your last menstrual period?"     No  Protocols used: SINUS PAIN OR CONGESTION-A-AH

## 2018-09-17 ENCOUNTER — Encounter

## 2018-09-17 ENCOUNTER — Ambulatory Visit: Payer: Managed Care, Other (non HMO) | Admitting: Family Medicine

## 2019-01-05 ENCOUNTER — Other Ambulatory Visit: Payer: Self-pay | Admitting: Obstetrics and Gynecology

## 2019-01-05 DIAGNOSIS — Z1231 Encounter for screening mammogram for malignant neoplasm of breast: Secondary | ICD-10-CM

## 2019-01-11 ENCOUNTER — Ambulatory Visit (INDEPENDENT_AMBULATORY_CARE_PROVIDER_SITE_OTHER): Payer: Managed Care, Other (non HMO) | Admitting: Family Medicine

## 2019-01-11 ENCOUNTER — Other Ambulatory Visit: Payer: Self-pay

## 2019-01-11 ENCOUNTER — Encounter: Payer: Self-pay | Admitting: Family Medicine

## 2019-01-11 ENCOUNTER — Other Ambulatory Visit: Payer: Self-pay | Admitting: Family Medicine

## 2019-01-11 ENCOUNTER — Telehealth: Payer: Self-pay | Admitting: Family Medicine

## 2019-01-11 DIAGNOSIS — R0982 Postnasal drip: Secondary | ICD-10-CM | POA: Diagnosis not present

## 2019-01-11 DIAGNOSIS — J019 Acute sinusitis, unspecified: Secondary | ICD-10-CM | POA: Diagnosis not present

## 2019-01-11 DIAGNOSIS — J029 Acute pharyngitis, unspecified: Secondary | ICD-10-CM

## 2019-01-11 MED ORDER — AZITHROMYCIN 250 MG PO TABS: ORAL_TABLET | ORAL | 0 refills | Status: DC

## 2019-01-11 NOTE — Telephone Encounter (Signed)
Can you put her on for lab visit at 1 PM?   Thanks  LG

## 2019-01-11 NOTE — Progress Notes (Signed)
Patient ID: Michelle Armstrong, female   DOB: 11-29-1960, 58 y.o.   MRN: [id number]  Virtual Visit via video Note  This visit type was conducted due to national recommendations for restrictions regarding the COVID-19 pandemic (e.g. social distancing).  This format is felt to be most appropriate for this patient at this time.  All issues noted in this document were discussed and addressed.  No physical exam was performed (except for noted visual exam findings with Video Visits).   I connected with [facility] on 01/11/19 at 11:20 AM EDT by a video enabled telemedicine application and verified that I am speaking with the correct person using two identifiers. Location patient: home Location provider: [facility] Persons participating in the virtual visit: patient, provider  I discussed the limitations, risks, security and privacy concerns of performing an evaluation and management service by video and the availability of in person appointments. I also discussed with the patient that there may be a patient responsible charge related to this service. The patient expressed understanding and agreed to proceed.   HPI:  Patient and I connected via video due to complaint of sinus congestion, postnasal drainage, sore throat.  Patient states her symptoms have been present for approximately 3 weeks.  First began with sinus congestion and pain and jaw 3 weeks ago.  Thought she had an abscessed tooth, reached out to her dentist who prescribed her a course of amoxicillin 3 times daily for 10 days.  Symptoms seemed to improve after the amox course. A few days after stopping the antibiotics, the congestion, nasal drainage and sore throat returned.   Now the symptoms have been present for a total of 3 weeks. She has tried many OTC remedies like lozenges, nasal rinses, salt water gargles, tylenol without much relief.   Denies fever or chills.  Denies body aches.  Denies cough, shortness of breath or  wheezing.  Denies abdominal pain, vomiting nausea or diarrhea.  Denies urinary symptoms.  Denies chest pain.    ROS: See pertinent positives and negatives per HPI.  Past Medical History:  Diagnosis Date  . 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 HLD (hyperlipidemia)   . Hypothyroidism    possible h/o  . Liver hemangioma    per patient  . Osteopenia 2016, 2018   spine/hip; [facility] at [facility]; 2018-hip  . 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 02/2010 Echo: EF 55-60%, no rwma.  . Vitamin D deficiency    low    Past Surgical History:  Procedure Laterality Date  . ABDOMINAL HYSTERECTOMY  1999   Total lap hyst BSO due to endometriosis  . CARDIAC ELECTROPHYSIOLOGY [place] AND ABLATION  2011  . COLONOSCOPY WITH PROPOFOL N/A 07/10/2015   Procedure: COLONOSCOPY WITH PROPOFOL;  Surgeon: Lollie Sails, MD;  Location: [facility] ENDOSCOPY;  Service: Endoscopy;  Laterality: N/A;  . GALLBLADDER SURGERY    . HERNIA REPAIR  12/2010  . LEFT HEART CATH AND CORONARY ANGIOGRAPHY N/A 02/24/2017   Procedure: Left Heart Cath and Coronary Angiography;  Surgeon: Wellington Hampshire, MD;  Location: [place] CV LAB;  Service: Cardiovascular;  Laterality: N/A;  . TONSILLECTOMY      Family History  Problem Relation Age of Onset  . Heart disease Father        s/p CABG  . Obesity  Father   . Hyperlipidemia Father   . Bladder Cancer Father   . Drug abuse Father   . Thyroid disease Sister   . Clotting disorder Sister        gene mutation  . Breast cancer Neg Hx    Social History   Tobacco Use  . Smoking status: Never Smoker  . Smokeless tobacco: Never Used  . Tobacco comment: tobacco use- no   Substance Use Topics  . Alcohol use: Yes    Alcohol/week: 0.0 standard drinks    Comment: rare glass of wine.    Current Outpatient Medications:  .  Ascorbic Acid (VITAMIN  C) 1000 MG tablet, Take 1,000 mg by mouth daily., Disp: , Rfl:  .  Calcium Carbonate 500 MG CHEW, Chew 1 tablet by mouth daily., Disp: , Rfl:  .  cholecalciferol (VITAMIN D) 1000 units tablet, Take by mouth daily., Disp: , Rfl:  .  Ferrous Gluconate-C-Folic Acid (IRON-C PO), Take by mouth., Disp: , Rfl:  .  methylPREDNISolone (MEDROL) 4 MG TBPK tablet, Tapering 6 day dose pack, Disp: 21 tablet, Rfl: 0 .  Multiple Vitamins-Minerals (MULTIVITAMIN PO), Take 1 tablet by mouth daily., Disp: , Rfl:  .  NONFORMULARY OR COMPOUNDED ITEM, [facility]:  Antiinflammatory Cream - Diclofenac 3%, Baclofen 2%, Lidocaine 2%, apply 1-2 grams to affected area 3-4 times daily., Disp: 120 each, Rfl: 2 .  Thiamine HCl (VITAMIN B-1 PO), Take 12.5 mg by mouth., Disp: , Rfl:  .  azithromycin (ZITHROMAX) 250 MG tablet, Take 2 tablets on day 1, take 2 tablet on days 2-5, Disp: 6 tablet, Rfl: 0  EXAM:  GENERAL: alert, oriented, appears well and in no acute distress  HEENT: atraumatic, conjunttiva clear, no obvious abnormalities on inspection of external nose and ears  NECK: normal movements of the head and neck  LUNGS: on inspection no signs of respiratory distress, breathing rate appears normal, no obvious gross SOB, gasping or wheezing  CV: no obvious cyanosis  MS: moves all visible extremities without noticeable abnormality  PSYCH/NEURO: pleasant and cooperative, no obvious depression or anxiety, speech and thought processing grossly intact  ASSESSMENT AND PLAN:  Discussed the following assessment and plan:  Acute sinusitis, recurrence not specified, unspecified location - Plan: azithromycin (ZITHROMAX) 250 MG tablet  Sore throat - Plan: azithromycin (ZITHROMAX) 250 MG tablet  Post-nasal drip - Plan: azithromycin (ZITHROMAX) 250 MG tablet  Patient appears to be in no acute distress while talking over the video call.  Due to symptoms being present for 3 weeks, I do not suspect COVID-19.  It is  possible she has a sinusitis infection causing postnasal drainage which is in turn causing her to have sore throat, potentially she could have strep throat as well.  I originally wanted to prescribe Augmentin, but patient declined due to that medication being very tough on her stomach.  Patient agreeable to azithromycin course.  She will also increase fluid intake, do good handwashing and do salt water gargles.  She will use Tylenol as needed for any soreness.  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  Patient states her employer, which is [employer], would really like her to have antibody testing due to her symptoms.  [employer] order requisition filled out and faxed over so patient can have Covid Antibody IgG testing performed.   I discussed the assessment and treatment plan with the patient. The patient was provided  an opportunity to ask questions and all were answered. The patient agreed with the plan and demonstrated an understanding of the instructions.   The patient was advised to call back or seek an in-person evaluation if the symptoms worsen or if the condition fails to improve as anticipated.  Jodelle Green, FNP

## 2019-01-12 LAB — SAR COV2 SEROLOGY (COVID19)AB(IGG),IA: SARS-CoV-2 Ab, IgG: NEGATIVE

## 2019-01-14 ENCOUNTER — Encounter: Payer: Self-pay | Admitting: Family Medicine

## 2019-01-25 ENCOUNTER — Telehealth: Payer: Self-pay

## 2019-01-25 DIAGNOSIS — E559 Vitamin D deficiency, unspecified: Secondary | ICD-10-CM

## 2019-01-25 DIAGNOSIS — E039 Hypothyroidism, unspecified: Secondary | ICD-10-CM

## 2019-01-25 DIAGNOSIS — Z13220 Encounter for screening for lipoid disorders: Secondary | ICD-10-CM

## 2019-01-25 DIAGNOSIS — Z0000 Encounter for general adult medical examination without abnormal findings: Secondary | ICD-10-CM

## 2019-01-25 DIAGNOSIS — Z131 Encounter for screening for diabetes mellitus: Secondary | ICD-10-CM

## 2019-01-25 DIAGNOSIS — D649 Anemia, unspecified: Secondary | ICD-10-CM

## 2019-01-25 DIAGNOSIS — E038 Other specified hypothyroidism: Secondary | ICD-10-CM

## 2019-01-25 DIAGNOSIS — D508 Other iron deficiency anemias: Secondary | ICD-10-CM

## 2019-01-25 NOTE — Telephone Encounter (Signed)
Pt has annual scheduled for 02/25/19 and would like orders for blood work to be put in so she can have it drawn before appt.  [phone]

## 2019-01-26 NOTE — Telephone Encounter (Signed)
Patient is schedule 02/09/19

## 2019-01-26 NOTE — Telephone Encounter (Signed)
Can you pls schedule pt, thank you.

## 2019-01-26 NOTE — Telephone Encounter (Signed)
Lab orders placed. Pls have pt sched lab appt. Thx.

## 2019-01-27 ENCOUNTER — Ambulatory Visit
Admission: RE | Admit: 2019-01-27 | Discharge: 2019-01-27 | Disposition: A | Discharge status: Home / Self Care | Payer: Managed Care, Other (non HMO) | Source: Ambulatory Visit | Attending: Obstetrics and Gynecology | Admitting: Obstetrics and Gynecology

## 2019-01-27 ENCOUNTER — Other Ambulatory Visit: Payer: Self-pay

## 2019-01-27 DIAGNOSIS — Z1231 Encounter for screening mammogram for malignant neoplasm of breast: Secondary | ICD-10-CM

## 2019-02-01 ENCOUNTER — Encounter: Payer: Self-pay | Admitting: Obstetrics and Gynecology

## 2019-02-09 ENCOUNTER — Other Ambulatory Visit: Payer: Self-pay

## 2019-02-09 ENCOUNTER — Other Ambulatory Visit: Payer: Managed Care, Other (non HMO)

## 2019-02-09 DIAGNOSIS — E559 Vitamin D deficiency, unspecified: Secondary | ICD-10-CM

## 2019-02-09 DIAGNOSIS — Z0000 Encounter for general adult medical examination without abnormal findings: Secondary | ICD-10-CM

## 2019-02-09 DIAGNOSIS — E038 Other specified hypothyroidism: Secondary | ICD-10-CM

## 2019-02-09 DIAGNOSIS — Z13220 Encounter for screening for lipoid disorders: Secondary | ICD-10-CM

## 2019-02-09 DIAGNOSIS — E039 Hypothyroidism, unspecified: Secondary | ICD-10-CM

## 2019-02-09 DIAGNOSIS — D508 Other iron deficiency anemias: Secondary | ICD-10-CM

## 2019-02-09 DIAGNOSIS — Z131 Encounter for screening for diabetes mellitus: Secondary | ICD-10-CM

## 2019-02-09 DIAGNOSIS — D649 Anemia, unspecified: Secondary | ICD-10-CM

## 2019-02-10 ENCOUNTER — Ambulatory Visit: Payer: Managed Care, Other (non HMO)

## 2019-02-10 ENCOUNTER — Encounter: Payer: Self-pay | Admitting: Obstetrics and Gynecology

## 2019-02-10 LAB — COMPREHENSIVE METABOLIC PANEL
ALT: 29 IU/L (ref 0–32)
AST: 30 IU/L (ref 0–40)
Albumin/Globulin Ratio: 2.3 — ABNORMAL HIGH (ref 1.2–2.2)
Albumin: 4.6 g/dL (ref 3.8–4.9)
Alkaline Phosphatase: 69 IU/L (ref 39–117)
BUN/Creatinine Ratio: 21 (ref 9–23)
BUN: 14 mg/dL (ref 6–24)
Bilirubin Total: 0.8 mg/dL (ref 0.0–1.2)
CO2: 24 mmol/L (ref 20–29)
Calcium: 9.4 mg/dL (ref 8.7–10.2)
Chloride: 104 mmol/L (ref 96–106)
Creatinine, Ser: 0.67 mg/dL (ref 0.57–1.00)
GFR calc Af Amer: 114 mL/min/{1.73_m2} (ref >59)
GFR calc non Af Amer: 99 mL/min/{1.73_m2} (ref >59)
Globulin, Total: 2 g/dL (ref 1.5–4.5)
Glucose: 84 mg/dL (ref 65–99)
Potassium: 4.5 mmol/L (ref 3.5–5.2)
Sodium: 142 mmol/L (ref 134–144)
Total Protein: 6.6 g/dL (ref 6.0–8.5)

## 2019-02-10 LAB — THYROID PANEL WITH TSH
Free Thyroxine Index: 1.5 (ref 1.2–4.9)
T3 Uptake Ratio: 24 % (ref 24–39)
T4, Total: 6.2 ug/dL (ref 4.5–12.0)
TSH: 2.72 u[IU]/mL (ref 0.450–4.500)

## 2019-02-10 LAB — CBC WITH DIFFERENTIAL/PLATELET
Basophils Absolute: 0.1 10*3/uL (ref 0.0–0.2)
Basos: 2 %
EOS (ABSOLUTE): 0.1 10*3/uL (ref 0.0–0.4)
Eos: 2 %
Hematocrit: 40.2 % (ref 34.0–46.6)
Hemoglobin: 13.5 g/dL (ref 11.1–15.9)
Immature Grans (Abs): 0 10*3/uL (ref 0.0–0.1)
Immature Granulocytes: 0 %
Lymphocytes Absolute: 1.5 10*3/uL (ref 0.7–3.1)
Lymphs: 39 %
MCH: 30.2 pg (ref 26.6–33.0)
MCHC: 33.6 g/dL (ref 31.5–35.7)
MCV: 90 fL (ref 79–97)
Monocytes Absolute: 0.3 10*3/uL (ref 0.1–0.9)
Monocytes: 6 %
Neutrophils Absolute: 2 10*3/uL (ref 1.4–7.0)
Neutrophils: 51 %
Platelets: 259 10*3/uL (ref 150–450)
RBC: 4.47 x10E6/uL (ref 3.77–5.28)
RDW: 13.1 % (ref 11.7–15.4)
WBC: 3.9 10*3/uL (ref 3.4–10.8)

## 2019-02-10 LAB — IRON AND TIBC
Iron Saturation: 27 % (ref 15–55)
Iron: 103 ug/dL (ref 27–159)
Total Iron Binding Capacity: 377 ug/dL (ref 250–450)
UIBC: 274 ug/dL (ref 131–425)

## 2019-02-10 LAB — VITAMIN D 25 HYDROXY (VIT D DEFICIENCY, FRACTURES): Vit D, 25-Hydroxy: 36.4 ng/mL (ref 30.0–100.0)

## 2019-02-10 LAB — MAGNESIUM: Magnesium: 2.3 mg/dL (ref 1.6–2.3)

## 2019-02-10 LAB — LIPID PANEL
Chol/HDL Ratio: 2.2 ratio (ref 0.0–4.4)
Cholesterol, Total: 235 mg/dL — ABNORMAL HIGH (ref 100–199)
HDL: 109 mg/dL (ref >39)
LDL Calculated: 111 mg/dL — ABNORMAL HIGH (ref 0–99)
Triglycerides: 73 mg/dL (ref 0–149)
VLDL Cholesterol Cal: 15 mg/dL (ref 5–40)

## 2019-02-10 LAB — HEMOGLOBIN A1C
Est. average glucose Bld gHb Est-mCnc: 108 mg/dL
Hgb A1c MFr Bld: 5.4 % (ref 4.8–5.6)

## 2019-02-10 LAB — B12 AND FOLATE PANEL
Folate: >20 ng/mL (ref >3.0)
Vitamin B-12: 712 pg/mL (ref 232–1245)

## 2019-02-10 LAB — AMYLASE: Amylase: 85 U/L (ref 31–110)

## 2019-02-10 LAB — FERRITIN: Ferritin: 31 ng/mL (ref 15–150)

## 2019-02-24 NOTE — Progress Notes (Signed)
Chief Complaint  Patient presents with  . Gynecologic Exam  . [employer] Employee     HPI:      [name] is a 58 y.o. G2P0011 who LMP was No LMP recorded. Patient has had a hysterectomy., presents today for her annual examination.  Her menses are absent due to total lap hyst BSO due to endometriosis 1999. She does not have intermenstrual bleeding.  Sex activity: single partner, contraception - status post hysterectomy. She does have vaginal dryness, improved with ThermaVi procedure and lubricants with some relief. Now having small amt vaginal bleeding with sex. Did oral HRT in past and didn't like it. Not sure if she wants vag ERT.  Last Pap: 02/19/18  Results were: no abnormalities. Neg HPV DNA.  Pt likes yearly paps. Hx of STDs: none  Last mammogram: 01/27/19  Results were: normal--routine follow-up in 12 months There is no FH of breast cancer. There is no FH of ovarian cancer. The patient does do self-breast exams.  Colonoscopy: colonoscopy 4 years ago without abnormalities. Repeat due in 5 yrs.  DEXA: 2018 [facility]. 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 Due for repeat 2020. Taking ca/Vit D.  Tobacco use: The patient denies current or previous tobacco use. Alcohol use: none Exercise: moderately active  She does get adequate calcium and Vitamin D in her diet. Recent labs WNL. Pt taking MVI with iron.   Pt notes episodes of pelvic cramping that then improve spontaneously. Has a hx of constipation. Drinks coffee daily for daily BM, but it's still hard and "balls". Taking calcium carbonate supp. Also takes benefiber daily and drinks lots of water. Has not tried stool softener, but doesn't like taking meds. Hx of bariatric surg. Wants IBD and celiac labs to rule out other path first.   Past Medical History:  Diagnosis Date  . 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 HLD (hyperlipidemia)   .  Hypothyroidism    possible h/o  . Liver hemangioma    per patient  . Osteopenia 2016, 2018   spine/hip; DEXA at [facility]; 2018-hip  . 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 02/2010 Echo: EF 55-60%, no rwma.  . Vitamin D deficiency    low    Past Surgical History:  Procedure Laterality Date  . ABDOMINAL HYSTERECTOMY  1999   Total lap hyst BSO due to endometriosis  . CARDIAC ELECTROPHYSIOLOGY [place] AND ABLATION  2011  . COLONOSCOPY WITH PROPOFOL N/A 07/10/2015   Procedure: COLONOSCOPY WITH PROPOFOL;  Surgeon: Lollie Sails, MD;  Location: [facility] ENDOSCOPY;  Service: Endoscopy;  Laterality: N/A;  . GALLBLADDER SURGERY    . HERNIA REPAIR  12/2010  . LEFT HEART CATH AND CORONARY ANGIOGRAPHY N/A 02/24/2017   Procedure: Left Heart Cath and Coronary Angiography;  Surgeon: Wellington Hampshire, MD;  Location: [place] CV LAB;  Service: Cardiovascular;  Laterality: N/A;  . TONSILLECTOMY      Family History  Problem Relation Age of Onset  . Heart disease Father        s/p CABG  . Obesity Father   . Hyperlipidemia Father   . Bladder Cancer Father   . Drug abuse Father   . Thyroid disease Sister   . Clotting disorder Sister        gene mutation  .  Breast cancer Neg Hx     Social History   Socioeconomic History  . Marital status: Married    Spouse name: Not on file  . Number of children: 1  . Years of education: Not on file  . Highest education level: Not on file  Occupational History  . Occupation: Armed forces technical officer: [employer]  Social Needs  . Financial resource strain: Not on file  . Food insecurity    Worry: Not on file    Inability: Not on file  . Transportation needs    Medical: Not on file    Non-medical: Not on file  Tobacco Use  . Smoking status: Never Smoker  . Smokeless tobacco: Never Used  . Tobacco comment: tobacco use- no   Substance and Sexual Activity  . Alcohol  use: Yes    Alcohol/week: 0.0 standard drinks    Comment: rare glass of wine.  . Drug use: No  . Sexual activity: Yes    Birth control/protection: Post-menopausal  Lifestyle  . Physical activity    Days per week: Not on file    Minutes per session: Not on file  . Stress: Not on file  Relationships  . Social Herbalist on phone: Not on file    Gets together: Not on file    Attends religious service: Not on file    Active member of club or organization: Not on file    Attends meetings of clubs or organizations: Not on file    Relationship status: Not on file  . Intimate partner violence    Fear of current or ex partner: Not on file    Emotionally abused: Not on file    Physically abused: Not on file    Forced sexual activity: Not on file  Other Topics Concern  . Not on file  Social History Narrative   Lives locally with husband.  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    Current Outpatient Medications on File Prior to Visit  Medication Sig Dispense Refill  . Ascorbic Acid (VITAMIN C PO) Take by mouth.    . Calcium Carbonate 500 MG CHEW Chew 1 tablet by mouth daily.    . cholecalciferol (VITAMIN D) 1000 units tablet Take by mouth daily.    . Ferrous Sulfate (IRON PO) Take by mouth.    . Multiple Vitamins-Minerals (MULTIVITAMIN PO) Take 1 tablet by mouth daily.    . Thiamine HCl (VITAMIN B-1 PO) Take by mouth.     No current facility-administered medications on file prior to visit.       ROS:  Review of Systems  Constitutional: Negative for fatigue, fever and unexpected weight change.  Respiratory: Negative for cough, shortness of breath and wheezing.   Cardiovascular: Negative for chest pain, palpitations and leg swelling.  Gastrointestinal: Positive for constipation. Negative for blood in stool, diarrhea, nausea and vomiting.  Endocrine: Negative for cold intolerance, heat intolerance and polyuria.  Genitourinary: Negative for dyspareunia, dysuria,  flank pain, frequency, genital sores, hematuria, menstrual problem, pelvic pain, urgency, vaginal bleeding, vaginal discharge and vaginal pain.  Musculoskeletal: Negative for back pain, joint swelling and myalgias.  Skin: Negative for rash.  Neurological: Negative for dizziness, syncope, light-headedness, numbness and headaches.  Hematological: Negative for adenopathy.  Psychiatric/Behavioral: Negative for agitation, confusion, sleep disturbance and suicidal ideas. The patient is not nervous/anxious.      Objective: BP 100/76   Ht 5\' 7"  (1.702 m)   BMI  24.59 kg/m    Physical Exam Constitutional:      Appearance: She is well-developed.  Genitourinary:     Vulva, vagina, right adnexa and left adnexa normal.     Vaginal atrophic mucosa present.     No vaginal discharge, erythema or tenderness.     Cervix is absent.     No right or left adnexal mass present.     Right adnexa not tender.     Left adnexa not tender.     Genitourinary Comments: UTERUS/CX SURG REM  Neck:     Musculoskeletal: Normal range of motion.     Thyroid: No thyromegaly.  Cardiovascular:     Rate and Rhythm: Normal rate and regular rhythm.     Heart sounds: Normal heart sounds. No murmur.  Pulmonary:     Effort: Pulmonary effort is normal.     Breath sounds: Normal breath sounds.  Chest:     Breasts:        Right: No mass, nipple discharge, skin change or tenderness.        Left: No mass, nipple discharge, skin change or tenderness.  Abdominal:     Palpations: Abdomen is soft.     Tenderness: There is no abdominal tenderness. There is no guarding.  Musculoskeletal: Normal range of motion.  Neurological:     General: No focal deficit present.     Mental Status: She is alert and oriented to person, place, and time.     Cranial Nerves: No cranial nerve deficit.  Skin:    General: Skin is warm and dry.  Psychiatric:        Mood and Affect: Mood normal.        Behavior: Behavior normal.        Thought  Content: Thought content normal.        Judgment: Judgment normal.  Vitals signs reviewed.     Assessment/Plan:  1. Encounter for annual routine gynecological examination  2. Cervical cancer screening Pt likes yearly paps - Pap IG (Image Guided)  3. Osteoporosis of lumbar spine DEXA due at [place]. Cont ca/VIt D. - DG Bone Density; Future  4. Constipation, unspecified constipation type Pt wants celiac/IBD labs. Will f/u with results. If neg, cont to treat for constipation. Try colace, change to calcium citrate.   5. Atrophic vaginitis Try 1 sample prem vag crm. Use externally for a few days to see if it helps (sample amt). Pt hesitant. Will call pt next wk anyway and see how she is doing.    GYN counsel breast self exam, mammography screening, menopause, adequate intake of calcium and vitamin D, diet and exercise     F/U  Return in about 1 year (around 02/25/2020).  Jhane Lorio B. Cyndy Braver, PA-C 03/01/2019 10:36 AM

## 2019-02-25 ENCOUNTER — Encounter: Payer: Self-pay | Admitting: Obstetrics and Gynecology

## 2019-02-25 ENCOUNTER — Telehealth: Payer: Self-pay

## 2019-02-25 ENCOUNTER — Ambulatory Visit (INDEPENDENT_AMBULATORY_CARE_PROVIDER_SITE_OTHER): Payer: Managed Care, Other (non HMO) | Admitting: Obstetrics and Gynecology

## 2019-02-25 ENCOUNTER — Other Ambulatory Visit: Payer: Self-pay

## 2019-02-25 VITALS — BP 100/76 | Ht 67.0 in

## 2019-02-25 DIAGNOSIS — Z01419 Encounter for gynecological examination (general) (routine) without abnormal findings: Secondary | ICD-10-CM | POA: Diagnosis not present

## 2019-02-25 DIAGNOSIS — N952 Postmenopausal atrophic vaginitis: Secondary | ICD-10-CM

## 2019-02-25 DIAGNOSIS — K5900 Constipation, unspecified: Secondary | ICD-10-CM

## 2019-02-25 DIAGNOSIS — Z124 Encounter for screening for malignant neoplasm of cervix: Secondary | ICD-10-CM

## 2019-02-25 DIAGNOSIS — M810 Age-related osteoporosis without current pathological fracture: Secondary | ICD-10-CM

## 2019-02-25 NOTE — Patient Instructions (Signed)
I value your feedback and entrusting us with your care. If you get a [facility] patient survey, I would appreciate you taking the time to let us know about your experience today. Thank you! 

## 2019-02-25 NOTE — Telephone Encounter (Signed)
Pt called to add some vitamins to her med list. Also wanted to let you know the calcium she is taking is calcium carbonate, and that she forgot to mention during exam that her left breast has been sore lately.

## 2019-02-26 NOTE — Telephone Encounter (Signed)
Pt aware.

## 2019-02-26 NOTE — Telephone Encounter (Signed)
Pls ask pt to change to calcium citrate to see if helps with constipation. I'm calling her Mon anyway and can f/u on breast soreness then

## 2019-03-01 ENCOUNTER — Other Ambulatory Visit: Payer: Managed Care, Other (non HMO)

## 2019-03-01 ENCOUNTER — Encounter: Payer: Self-pay | Admitting: Obstetrics and Gynecology

## 2019-03-01 ENCOUNTER — Other Ambulatory Visit: Payer: Self-pay

## 2019-03-01 DIAGNOSIS — K5900 Constipation, unspecified: Secondary | ICD-10-CM

## 2019-03-02 LAB — PAP IG (IMAGE GUIDED)

## 2019-03-03 LAB — INFLAMMATORY BOWEL DISEASE-IBD
Atypical pANCA: <1:20 {titer}
Saccharomyces cerevisiae, IgA: <20 Units (ref 0.0–24.9)
Saccharomyces cerevisiae, IgG: <20 Units (ref 0.0–24.9)

## 2019-03-03 LAB — CELIAC DISEASE PANEL
Endomysial IgA: NEGATIVE
IgA/Immunoglobulin A, Serum: 119 mg/dL (ref 87–352)
Transglutaminase IgA: <2 U/mL (ref 0–3)

## 2019-07-01 ENCOUNTER — Telehealth: Payer: Self-pay

## 2019-07-01 NOTE — Telephone Encounter (Signed)
Copied from [place] [id number]. Topic: Quick Communication - See Telephone Encounter >> Jul 01, 2019  8:45 AM Loma Boston wrote: CRM for notification. See Telephone encounter for: 07/01/19. Concerning having to return to work at [employer] having return to work and have bad allergies, wants to have a discussion maybe trying to work half days, wants to talk to Dr Chauncey Cruel nurse for advice and a possible note.carotid bruit at [phone]

## 2019-07-01 NOTE — Telephone Encounter (Signed)
I called and spoke with the pateint and she states she has worked from home for the past 7 months and she has been called back in to work at [employer]. 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  The patient is not asking to be taking out of work she would like a note from the provider explaining that due to her medical history she would like to work 4 hours in the office and 4 hours at home.  If you agree to this I will construct the letter and request your signature. The patient want the letter emailed to [email or web address].  Please advise.  Blayke Cordrey,cma

## 2019-07-02 NOTE — Telephone Encounter (Signed)
Patient stated she would like a call back from Gae Bon to go over the letter. Please advise.

## 2019-07-02 NOTE — Telephone Encounter (Signed)
Patient returning call to Gae Bon, requesting call back.

## 2019-07-05 NOTE — Telephone Encounter (Signed)
Pt calling back to check status. Pt would like a call back regarding.

## 2019-07-06 NOTE — Telephone Encounter (Signed)
I called and left a voicemail informing the patient that the provider would like for her to do a  Follow up visit to determine if she could work from home and why she is having an issue with wearing a mask.  I informed her to call the office to schedule a visit.  [name],cma

## 2019-07-06 NOTE — Telephone Encounter (Signed)
Can you contact the patient and see what medical issue is making it difficult to wear the mask? I have not seen her since 2018 and based on my prior visits with her there was not a reason she would not be able to wear a mask. If she has developed an issue that would make it difficult to wear the mask since I last saw her we should do a follow-up visit to help determine any treatment and the appropriateness of working from home. Thanks.

## 2019-07-08 NOTE — Telephone Encounter (Signed)
I called and left a voicemail for the patient to schedule a visit and I also informed her that I messaged her through [facility] as well. Nile Dorning,cma

## 2019-07-23 ENCOUNTER — Other Ambulatory Visit: Payer: Self-pay

## 2019-07-23 DIAGNOSIS — Z20822 Contact with and (suspected) exposure to covid-19: Secondary | ICD-10-CM

## 2019-07-24 LAB — NOVEL CORONAVIRUS, NAA: SARS-CoV-2, NAA: NOT DETECTED

## 2019-08-02 ENCOUNTER — Telehealth: Payer: Self-pay | Admitting: General Practice

## 2019-08-02 NOTE — Telephone Encounter (Signed)
Negative COVID results given. Patient results "NOT Detected." Caller expressed understanding. ° °

## 2019-08-23 NOTE — Telephone Encounter (Signed)
LM asking patient to call back & get her scheduled for f/u appointment with Dr. Caryl Bis.

## 2019-09-03 ENCOUNTER — Telehealth: Payer: Self-pay | Admitting: Obstetrics and Gynecology

## 2019-09-03 NOTE — Telephone Encounter (Signed)
Patient is calling inquiring about her Bone Density. I have given patient phone number to contact [facility] to schedule her appointment

## 2019-09-07 ENCOUNTER — Telehealth: Payer: Self-pay

## 2019-09-07 NOTE — Telephone Encounter (Signed)
That is fine. We can sched at [facility] if she prefers, however.

## 2019-09-07 NOTE — Telephone Encounter (Signed)
Pt needing a call regarding her bone density test

## 2019-09-07 NOTE — Telephone Encounter (Signed)
Needs to let [facility] know that the calibration at [facility] may be different than [facility], this is where she's going to get her bone density scan done, Can you let her know if this is ok

## 2019-09-08 NOTE — Telephone Encounter (Signed)
Pt aware and will stay with [facility]. Wishes you a very Merry Christmas and thanks you for all you do!

## 2019-09-16 ENCOUNTER — Other Ambulatory Visit: Payer: Managed Care, Other (non HMO)

## 2019-09-16 ENCOUNTER — Ambulatory Visit
Admission: RE | Admit: 2019-09-16 | Discharge: 2019-09-16 | Disposition: A | Discharge status: Home / Self Care | Payer: Managed Care, Other (non HMO) | Source: Ambulatory Visit | Attending: Obstetrics and Gynecology | Admitting: Obstetrics and Gynecology

## 2019-09-16 DIAGNOSIS — M810 Age-related osteoporosis without current pathological fracture: Secondary | ICD-10-CM | POA: Insufficient documentation

## 2019-09-20 ENCOUNTER — Telehealth: Payer: Self-pay | Admitting: Obstetrics and Gynecology

## 2019-09-20 ENCOUNTER — Encounter: Payer: Self-pay | Admitting: Obstetrics and Gynecology

## 2019-09-20 NOTE — Telephone Encounter (Signed)
LMTRC

## 2019-09-20 NOTE — Telephone Encounter (Signed)
Patient is calling for her Bone density results. Please advise

## 2019-12-27 ENCOUNTER — Other Ambulatory Visit: Payer: Self-pay | Admitting: Obstetrics and Gynecology

## 2019-12-27 ENCOUNTER — Telehealth: Payer: Self-pay | Admitting: Obstetrics and Gynecology

## 2019-12-27 DIAGNOSIS — Z13220 Encounter for screening for lipoid disorders: Secondary | ICD-10-CM

## 2019-12-27 DIAGNOSIS — Z1321 Encounter for screening for nutritional disorder: Secondary | ICD-10-CM

## 2019-12-27 DIAGNOSIS — Z1329 Encounter for screening for other suspected endocrine disorder: Secondary | ICD-10-CM

## 2019-12-27 DIAGNOSIS — Z1231 Encounter for screening mammogram for malignant neoplasm of breast: Secondary | ICD-10-CM

## 2019-12-27 DIAGNOSIS — Z0000 Encounter for general adult medical examination without abnormal findings: Secondary | ICD-10-CM

## 2019-12-27 DIAGNOSIS — Z131 Encounter for screening for diabetes mellitus: Secondary | ICD-10-CM

## 2019-12-27 NOTE — Telephone Encounter (Signed)
Pt coming for her annual and would like labs put in before hand so she can have those done before her appointment. Please advise

## 2019-12-27 NOTE — Telephone Encounter (Signed)
Lab orders placed. Pls notify pt to sched fasting lab appt. Thx

## 2019-12-28 NOTE — Telephone Encounter (Signed)
Pt aware. Michelle Armstrong, can you pls call pt to schedule fasting lab appt, thx. [phone]

## 2019-12-28 NOTE — Telephone Encounter (Signed)
scheduled

## 2019-12-31 ENCOUNTER — Other Ambulatory Visit: Payer: Managed Care, Other (non HMO)

## 2020-01-07 ENCOUNTER — Other Ambulatory Visit: Payer: Self-pay

## 2020-01-07 ENCOUNTER — Other Ambulatory Visit: Payer: Managed Care, Other (non HMO)

## 2020-01-07 DIAGNOSIS — Z1321 Encounter for screening for nutritional disorder: Secondary | ICD-10-CM

## 2020-01-07 DIAGNOSIS — Z13220 Encounter for screening for lipoid disorders: Secondary | ICD-10-CM

## 2020-01-07 DIAGNOSIS — Z1329 Encounter for screening for other suspected endocrine disorder: Secondary | ICD-10-CM

## 2020-01-07 DIAGNOSIS — Z131 Encounter for screening for diabetes mellitus: Secondary | ICD-10-CM

## 2020-01-07 DIAGNOSIS — Z0000 Encounter for general adult medical examination without abnormal findings: Secondary | ICD-10-CM

## 2020-01-08 LAB — COMPREHENSIVE METABOLIC PANEL
ALT: 23 IU/L (ref 0–32)
AST: 30 IU/L (ref 0–40)
Albumin/Globulin Ratio: 2.1 (ref 1.2–2.2)
Albumin: 4.6 g/dL (ref 3.8–4.9)
Alkaline Phosphatase: 87 IU/L (ref 39–117)
BUN/Creatinine Ratio: 19 (ref 9–23)
BUN: 15 mg/dL (ref 6–24)
Bilirubin Total: 0.9 mg/dL (ref 0.0–1.2)
CO2: 24 mmol/L (ref 20–29)
Calcium: 9.3 mg/dL (ref 8.7–10.2)
Chloride: 103 mmol/L (ref 96–106)
Creatinine, Ser: 0.78 mg/dL (ref 0.57–1.00)
GFR calc Af Amer: 98 mL/min/{1.73_m2} (ref >59)
GFR calc non Af Amer: 85 mL/min/{1.73_m2} (ref >59)
Globulin, Total: 2.2 g/dL (ref 1.5–4.5)
Glucose: 86 mg/dL (ref 65–99)
Potassium: 4 mmol/L (ref 3.5–5.2)
Sodium: 141 mmol/L (ref 134–144)
Total Protein: 6.8 g/dL (ref 6.0–8.5)

## 2020-01-08 LAB — THYROID PANEL WITH TSH
Free Thyroxine Index: 1.9 (ref 1.2–4.9)
T3 Uptake Ratio: 27 % (ref 24–39)
T4, Total: 7 ug/dL (ref 4.5–12.0)
TSH: 1.79 u[IU]/mL (ref 0.450–4.500)

## 2020-01-08 LAB — MAGNESIUM: Magnesium: 2.2 mg/dL (ref 1.6–2.3)

## 2020-01-08 LAB — CBC
Hematocrit: 39.4 % (ref 34.0–46.6)
Hemoglobin: 13.3 g/dL (ref 11.1–15.9)
MCH: 29.4 pg (ref 26.6–33.0)
MCHC: 33.8 g/dL (ref 31.5–35.7)
MCV: 87 fL (ref 79–97)
Platelets: 256 10*3/uL (ref 150–450)
RBC: 4.52 x10E6/uL (ref 3.77–5.28)
RDW: 13.1 % (ref 11.7–15.4)
WBC: 3.9 10*3/uL (ref 3.4–10.8)

## 2020-01-08 LAB — LIPID PANEL
Chol/HDL Ratio: 2 ratio (ref 0.0–4.4)
Cholesterol, Total: 240 mg/dL — ABNORMAL HIGH (ref 100–199)
HDL: 118 mg/dL (ref >39)
LDL Chol Calc (NIH): 110 mg/dL — ABNORMAL HIGH (ref 0–99)
Triglycerides: 75 mg/dL (ref 0–149)
VLDL Cholesterol Cal: 12 mg/dL (ref 5–40)

## 2020-01-08 LAB — HEMOGLOBIN A1C
Est. average glucose Bld gHb Est-mCnc: 105 mg/dL
Hgb A1c MFr Bld: 5.3 % (ref 4.8–5.6)

## 2020-01-08 LAB — IRON,TIBC AND FERRITIN PANEL
Ferritin: 24 ng/mL (ref 15–150)
Iron Saturation: 33 % (ref 15–55)
Iron: 117 ug/dL (ref 27–159)
Total Iron Binding Capacity: 357 ug/dL (ref 250–450)
UIBC: 240 ug/dL (ref 131–425)

## 2020-01-08 LAB — B12 AND FOLATE PANEL
Folate: >20 ng/mL (ref >3.0)
Vitamin B-12: 639 pg/mL (ref 232–1245)

## 2020-01-08 LAB — VITAMIN D 25 HYDROXY (VIT D DEFICIENCY, FRACTURES): Vit D, 25-Hydroxy: 41.7 ng/mL (ref 30.0–100.0)

## 2020-01-08 LAB — AMYLASE: Amylase: 82 U/L (ref 31–110)

## 2020-01-14 ENCOUNTER — Encounter: Payer: Self-pay | Admitting: Obstetrics and Gynecology

## 2020-01-14 ENCOUNTER — Ambulatory Visit
Admission: RE | Admit: 2020-01-14 | Discharge: 2020-01-14 | Disposition: A | Discharge status: Home / Self Care | Payer: Managed Care, Other (non HMO) | Source: Ambulatory Visit | Attending: Obstetrics and Gynecology | Admitting: Obstetrics and Gynecology

## 2020-01-14 DIAGNOSIS — Z1231 Encounter for screening mammogram for malignant neoplasm of breast: Secondary | ICD-10-CM | POA: Diagnosis present

## 2020-01-21 ENCOUNTER — Ambulatory Visit: Payer: Managed Care, Other (non HMO) | Admitting: Obstetrics and Gynecology

## 2020-02-23 ENCOUNTER — Telehealth: Payer: Self-pay

## 2020-02-23 NOTE — Telephone Encounter (Signed)
Patient is scheduled w/[facility] for AE tomorrow morning. She has already had her labs and mammogram. Her employer is requesting that she get a SARS/COVID2 serology semi-quant total antibody test#[id number]. Patient requesting [facility] place order for that so she can have it drawn. BR#[phone] work #

## 2020-02-23 NOTE — Telephone Encounter (Signed)
Ok. Can we do it tomorrow at her appt or does she need it sooner? If tom, I will just put order in then.

## 2020-02-23 NOTE — Telephone Encounter (Signed)
Tomorrow morning pls, ty. Just a heads up.

## 2020-02-24 ENCOUNTER — Other Ambulatory Visit: Payer: Managed Care, Other (non HMO)

## 2020-02-24 ENCOUNTER — Other Ambulatory Visit: Payer: Self-pay

## 2020-02-24 DIAGNOSIS — Z1152 Encounter for screening for COVID-19: Secondary | ICD-10-CM

## 2020-02-24 NOTE — Progress Notes (Signed)
Sars AB screening per her employer

## 2020-02-25 LAB — SARS-COV-2 SEMI-QUANTITATIVE TOTAL ANTIBODY, SPIKE: SARS-CoV-2 Semi-Quant Total Ab: 526.4 U/mL (ref <0.8)

## 2020-03-02 NOTE — Progress Notes (Signed)
Chief Complaint  Patient presents with   Gynecologic Exam   [employer] Employee     HPI:      [name] is a 59 y.o. G2P0011 who LMP was No LMP recorded. Patient has had a hysterectomy., presents today for her annual examination. Her menses are absent due to total lap hyst BSO due to endometriosis 1999.She does nothave intermenstrual bleeding. No vasomotor sx.  Sex activity:single partner, contraception -status post hysterectomy. Shedoeshave vaginal dryness, improved with ThermaVi procedure and lubricants with some relief.No longer having vaginal bleeding with sex. Did oral HRT in past and didn't like it, has declined vag ERT. Has gene for clotting disorder.  Last Pap:6/11/20Results were: no abnormalities. Neg HPV DNA 2019.  Pt likes yearly paps (gets reimbursed from [facility]) Hx of STDs:none  Last mammogram:4/30/21Results were: normal--routine follow-up in 12 months There is no FH of breast cancer. There is no FH of ovarian cancer. The patientdoesdo self-breast exams.  Colonoscopy:colonoscopy2016 withoutabnormalities, with Dr. Gustavo Lah. Repeat due after 5 yrs.Has appt sched 10/21.  DEXA:2020 at [facility], 2018 at [facility]. 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 Taking ca/Vit D.  Tobacco use:The patient denies current or previous tobacco use. Alcohol GEZ:MOQH No drug use Exercise:moderately active  Shedoesget adequate calcium and Vitamin D in her diet. Recent labs 4/21 WNL. Total chol borderline but HDL is 118. Pt taking MVI with iron.       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 02/2010 Echo: EF 55-60%, no rwma.   Vitamin D deficiency    low         Past Surgical History:  Procedure Laterality Date   ABDOMINAL HYSTERECTOMY  1999   Total lap hyst BSO due to endometriosis   CARDIAC ELECTROPHYSIOLOGY MAPPING AND ABLATION  2011   COLONOSCOPY WITH PROPOFOL N/A 07/10/2015   Procedure: COLONOSCOPY WITH PROPOFOL;  Surgeon: Lollie Sails, MD;  Location[facility] ENDOSCOPY;  Service: Endoscopy;  Laterality: N/A;   GALLBLADDER SURGERY     HERNIA REPAIR  12/2010   LEFT HEART CATH AND CORONARY ANGIOGRAPHY N/A 02/24/2017   Procedure: Left Heart Cath and Coronary Angiography;  Surgeon: Wellington Hampshire, MD;  Location: [place] CV LAB;  Service: Cardiovascular;  Laterality: N/A;   TONSILLECTOMY           Family History  Problem Relation Age of Onset   Heart disease Father        s/p CABG   Obesity Father    Hyperlipidemia Father    Bladder Cancer Father    Drug abuse Father    Thyroid disease Sister    Clotting disorder Sister        gene mutation   Breast cancer Neg Hx     Social History        Socioeconomic History   Marital status: Married    Spouse name: Not on file   Number of children: 1   Years of education: Not on file   Highest education  level: Not on file  Occupational History   Occupation: customer service rep    Employer: [employer] resource strain: Not on file   Food insecurity    Worry: Not on file    Inability: Not on file   Transportation needs    Medical: Not on file    Non-medical: Not on file  Tobacco Use   Smoking status: Never Smoker   Smokeless tobacco: Never Used   Tobacco comment: tobacco use- no   Substance and Sexual Activity   Alcohol use: Yes    Alcohol/week: 0.0 standard drinks    Comment: rare glass of wine.   Drug use: No   Sexual activity: Yes    Birth  control/protection: Post-menopausal  Lifestyle   Physical activity    Days per week: Not on file    Minutes per session: Not on file   Stress: Not on file  Relationships   Social connections    Talks on phone: Not on file    Gets together: Not on file    Attends religious service: Not on file    Active member of club or organization: Not on file    Attends meetings of clubs or organizations: Not on file    Relationship status: Not on file   Intimate partner violence    Fear of current or ex partner: Not on file    Emotionally abused: Not on file    Physically abused: Not on file    Forced sexual activity: Not on file  Other Topics Concern   Not on file  Social History Narrative   Lives locally with husband.  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          Current Outpatient Medications on File Prior to Visit  Medication Sig Dispense Refill   Ascorbic Acid (VITAMIN C PO) Take by mouth.     Calcium Carbonate 500 MG CHEW Chew 1 tablet by mouth daily.     cholecalciferol (VITAMIN D) 1000 units tablet Take by mouth daily.     Ferrous Sulfate (IRON PO) Take by mouth.     Multiple Vitamins-Minerals (MULTIVITAMIN PO) Take 1 tablet by mouth daily.     Thiamine HCl (VITAMIN B-1 PO) Take by mouth.     No current facility-administered medications on file prior to visit.       ROS:  Review of Systems  Constitutional: Negative for fatigue, fever and unexpected weight change.  Respiratory: Negative for cough, shortness of breath and wheezing.   Cardiovascular: Negative for chest pain, palpitations and leg swelling.  Gastrointestinal:  Negative for blood in stool, diarrhea, nausea and vomiting.  Endocrine: Negative for cold intolerance, heat intolerance and polyuria.  Genitourinary: Negative for dyspareunia, dysuria, flank pain, frequency, genital sores, hematuria, menstrual problem, pelvic pain, urgency, vaginal  bleeding, vaginal discharge and vaginal pain.  Musculoskeletal: Negative for back pain, joint swelling and myalgias.  Skin: Negative for rash.  Neurological: Negative for dizziness, syncope, light-headedness, numbness and headaches.  Hematological: Negative for adenopathy.  Psychiatric/Behavioral: Negative for agitation, confusion, sleep disturbance and suicidal ideas. The patient is not nervous/anxious.      Objective: BP 100/76    Ht 5\' 7"  (1.702 m)    BMI 24.59 kg/m    Physical Exam Constitutional:      Appearance: She is well-developed.  Genitourinary:     Vulva, vagina, right adnexa and left adnexa normal.     Vaginal atrophic mucosa present.  No vaginal discharge, erythema or tenderness.     Cervix is absent.     No right or left adnexal mass present.     Right adnexa not tender.     Left adnexa not tender.     Genitourinary Comments: UTERUS/CX SURG REM  Neck:     Musculoskeletal: Normal range of motion.     Thyroid: No thyromegaly.  Cardiovascular:     Rate and Rhythm: Normal rate and regular rhythm.     Heart sounds: Normal heart sounds. No murmur.  Pulmonary:     Effort: Pulmonary effort is normal.     Breath sounds: Normal breath sounds.  Chest:     Breasts:        Right: No mass, nipple discharge, skin change or tenderness.        Left: No mass, nipple discharge, skin change or tenderness.  Abdominal:     Palpations: Abdomen is soft.     Tenderness: There is no abdominal tenderness. There is no guarding.  Musculoskeletal: Normal range of motion.  Neurological:     General: No focal deficit present.     Mental Status: She is alert and oriented to person, place, and time.     Cranial Nerves: No cranial nerve deficit.  Skin:    General: Skin is warm and dry.  Psychiatric:        Mood and Affect: Mood normal.        Behavior: Behavior normal.        Thought Content: Thought content normal.        Judgment: Judgment normal.  Vitals signs reviewed.      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 Repeat due 2022. Cont ca/Vit D/exercise.  Postmenopausal atrophic vaginitis--tolerable currently. F/u prn.    GYN counsel breast self exam, mammography screening, menopause, adequate intake of calcium and vitamin D, diet and exercise     F/U             Return in about 1 year (around 02/25/2020).  Izen Petz B. Miroslav Gin, PA-C 03/01/2019 10:36 AM

## 2020-03-03 ENCOUNTER — Ambulatory Visit (INDEPENDENT_AMBULATORY_CARE_PROVIDER_SITE_OTHER): Payer: Managed Care, Other (non HMO) | Admitting: Obstetrics and Gynecology

## 2020-03-03 ENCOUNTER — Other Ambulatory Visit: Payer: Self-pay

## 2020-03-03 ENCOUNTER — Encounter: Payer: Self-pay | Admitting: Obstetrics and Gynecology

## 2020-03-03 ENCOUNTER — Telehealth: Payer: Self-pay

## 2020-03-03 VITALS — BP 108/60 | Ht 67.0 in | Wt 165.0 lb

## 2020-03-03 DIAGNOSIS — Z1151 Encounter for screening for human papillomavirus (HPV): Secondary | ICD-10-CM | POA: Diagnosis not present

## 2020-03-03 DIAGNOSIS — Z1231 Encounter for screening mammogram for malignant neoplasm of breast: Secondary | ICD-10-CM

## 2020-03-03 DIAGNOSIS — Z124 Encounter for screening for malignant neoplasm of cervix: Secondary | ICD-10-CM

## 2020-03-03 DIAGNOSIS — Z01419 Encounter for gynecological examination (general) (routine) without abnormal findings: Secondary | ICD-10-CM | POA: Diagnosis not present

## 2020-03-03 DIAGNOSIS — Z1211 Encounter for screening for malignant neoplasm of colon: Secondary | ICD-10-CM

## 2020-03-03 DIAGNOSIS — N952 Postmenopausal atrophic vaginitis: Secondary | ICD-10-CM

## 2020-03-03 DIAGNOSIS — M810 Age-related osteoporosis without current pathological fracture: Secondary | ICD-10-CM

## 2020-03-03 NOTE — Patient Instructions (Signed)
I value your feedback and entrusting us with your care. If you get a [facility] patient survey, I would appreciate you taking the time to let us know about your experience today. Thank you!  As of August 26, 2019, your lab results will be released to your MyChart immediately, before I even have a chance to see them. Please give me time to review them and contact you if there are any abnormalities. Thank you for your patience.  

## 2020-03-03 NOTE — Telephone Encounter (Signed)
If they're working, then they should be fine. I don't know this product specifically but collagen is fine to take.

## 2020-03-03 NOTE — Telephone Encounter (Signed)
Pt aware. Would like to know if you have a different product that works better than what she's taking? If not, she will continue this one.

## 2020-03-03 NOTE — Telephone Encounter (Signed)
Those are fine. I think she has already tried fiber supplements without relief.

## 2020-03-03 NOTE — Telephone Encounter (Signed)
Pt calling; was seen this am; forgot to ask about constipation; is taking vital nutrients, calogen peptide one scoop BID.  Wants [facility]'s opinion of this product.  [phone]

## 2020-03-06 LAB — IGP, APTIMA HPV: HPV Aptima: NEGATIVE

## 2020-04-18 ENCOUNTER — Other Ambulatory Visit: Payer: Self-pay

## 2020-04-18 ENCOUNTER — Ambulatory Visit: Payer: Managed Care, Other (non HMO) | Admitting: Internal Medicine

## 2020-04-18 ENCOUNTER — Encounter: Payer: Self-pay | Admitting: Internal Medicine

## 2020-04-18 VITALS — BP 98/68 | HR 85 | Temp 98.4°F | Ht 67.0 in | Wt 160.0 lb

## 2020-04-18 DIAGNOSIS — W57XXXA Bitten or stung by nonvenomous insect and other nonvenomous arthropods, initial encounter: Secondary | ICD-10-CM

## 2020-04-18 DIAGNOSIS — S40861A Insect bite (nonvenomous) of right upper arm, initial encounter: Secondary | ICD-10-CM | POA: Diagnosis not present

## 2020-04-18 MED ORDER — TRIAMCINOLONE ACETONIDE 0.1 % EX CREA: 1.0000 "application " | TOPICAL_CREAM | Freq: Two times a day (BID) | CUTANEOUS | 0 refills | Status: DC

## 2020-04-18 MED ORDER — DOXYCYCLINE HYCLATE 100 MG PO TABS: 100.0000 mg | ORAL_TABLET | Freq: Two times a day (BID) | ORAL | 0 refills | Status: DC

## 2020-04-18 NOTE — Progress Notes (Signed)
Chief Complaint  Patient presents with  . Insect Bite   F/u  1. Right shoulder insect bite ? 3 weeks ago at work lifting a box and after bite area of bite front of shoulder associated with burning and tingling and h/a and joint pain in right shoulder and knees and h/a since the bite on and off. Area is burning and itching with red welp with circle around it.  Tried green tea/ginger/tylenol   Review of Systems  Cardiovascular: Negative for chest pain.  Musculoskeletal: Positive for joint pain.  Skin: Positive for itching and rash.  Neurological: Positive for headaches.   Past Medical History:  Diagnosis Date  . Clotting disorder ([place])    pt states she has a mutated gene that predisposes her to clotting  . Early menopause    age [age]  . HLD (hyperlipidemia)   . Hypothyroidism    possible h/o  . Liver hemangioma    per patient  . Osteopenia 2016, 2018   spine/hip; D[facility] at [facility]; 2018-hip  . Osteoporosis 2018   spine; DEXA at [facility]  . 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 02/2010 Echo: EF 55-60%, no rwma.  . Vitamin D deficiency    low   Past Surgical History:  Procedure Laterality Date  . ABDOMINAL HYSTERECTOMY  1999   Total lap hyst BSO due to endometriosis  . CARDIAC ELECTROPHYSIOLOGY [place] AND ABLATION  2011  . COLONOSCOPY WITH PROPOFOL N/A 07/10/2015   Procedure: COLONOSCOPY WITH PROPOFOL;  Surgeon: Lollie Sails, MD;  [facility] ENDOSCOPY;  Service: Endoscopy;  Laterality: N/A;  . GALLBLADDER SURGERY    . HERNIA REPAIR  12/2010  . LEFT HEART CATH AND CORONARY ANGIOGRAPHY N/A 02/24/2017   Procedure: Left Heart Cath and Coronary Angiography;  Surgeon: Wellington Hampshire, MD;  Location: [place] CV LAB;  Service: Cardiovascular;  Laterality: N/A;  . TONSILLECTOMY     Family History  Problem Relation Age of Onset  . Heart disease Father        s/p CABG  . Obesity Father   . Hyperlipidemia Father   . Bladder Cancer Father     . Drug abuse Father   . Thyroid disease Sister   . Clotting disorder Sister        gene mutation  . Breast cancer Neg Hx    Social History   Socioeconomic History  . Marital status: Married    Spouse name: Not on file  . Number of children: 1  . Years of education: Not on file  . Highest education level: Not on file  Occupational History  . Occupation: Armed forces technical officer: [employer]  Tobacco Use  . Smoking status: Never Smoker  . Smokeless tobacco: Never Used  . Tobacco comment: tobacco use- no   Vaping Use  . Vaping Use: Never used  Substance and Sexual Activity  . Alcohol use: Yes    Alcohol/week: 0.0 standard drinks    Comment: rare glass of wine.  . Drug use: No  . Sexual activity: Yes    Birth control/protection: Post-menopausal  Other Topics Concern  . Not on file  Social History Narrative   Lives locally with husband.  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   Social Determinants of Health   Financial Resource Strain:   . Difficulty of Paying Living Expenses:   Food Insecurity:   . Worried About Charity fundraiser  in the Last Year:   . [place] in the Last Year:   Transportation Needs:   . Film/video editor (Medical):   Marland Kitchen Lack of Transportation (Non-Medical):   Physical Activity:   . Days of Exercise per Week:   . Minutes of Exercise per Session:   Stress:   . Feeling of Stress :   Social Connections:   . Frequency of Communication with Friends and Family:   . Frequency of Social Gatherings with Friends and Family:   . Attends Religious Services:   . Active Member of Clubs or Organizations:   . Attends Archivist Meetings:   Marland Kitchen Marital Status:   Intimate Partner Violence:   . Fear of Current or Ex-Partner:   . Emotionally Abused:   Marland Kitchen Physically Abused:   . Sexually Abused:    Current Meds  Medication Sig  . Acetaminophen (TYLENOL PO) Take by mouth.  . Ascorbic Acid (VITAMIN C PO) Take by mouth.  .  Calcium Carbonate 500 MG CHEW Chew 1 tablet by mouth daily.  . cholecalciferol (VITAMIN D) 1000 units tablet Take by mouth daily.  . Ferrous Sulfate (IRON PO) Take by mouth.  . Multiple Vitamins-Minerals (MULTIVITAMIN PO) Take 1 tablet by mouth daily.  . Thiamine HCl (VITAMIN B-1 PO) Take by mouth.   Allergies  Allergen Reactions  . Cephalexin     Rash & dizziness  . Latex     rash  . Metoprolol     REACTION: dizziness, slurred speech  . Nsaids Other (See Comments)    ulcers ulcers  . Penicillin G Itching  . Pollen Extract    Recent Results (from the past 2160 hour(s))  SARS-CoV-2 Semi-Quantitative Total Antibody, Spike     Status: None   Collection Time: 02/24/20  8:22 AM  Result Value Ref Range   SARS-CoV-2 Semi-Quant Total Ab 526.4 <0.8 U/mL    Comment: Antibodies against the SARS-CoV-2 spike protein receptor binding domain (RBD) were detected. It is yet undetermined what level of antibody to SARS-CoV-2 spike protein correlates to immunity against developing symptomatic SARS-CoV-2 disease. Studies are underway to measure the quantitative levels of specific SARS-CoV-2 antibodies following vaccination. Such studies will provide valuable insights into the correlation between protection from vaccination and antibody levels.                                    Interpretation:                                      Negative    <0.8 Sample does NOT contain detectable antibodies against the SARS-Cov-2 spike protein receptor binding domain (RBD).                                      Positive    >0.7 Sample contains detectable antibodies against the SARS-Cov-2 spike protein receptor binding domain (RBD).   IGP, Aptima HPV     Status: None   Collection Time: 03/03/20 11:09 AM  Result Value Ref Range   Interpretation NILM     Comment: NEGATIVE FOR INTRAEPITHELIAL LESION OR MALIGNANCY.   Category NIL     Comment: Negative for Intraepithelial Lesion   Infection INCH     Comment:  CELLULAR CHANGES ASSOCIATED WITH INFLAMMATION ARE PRESENT.   Adequacy ENDO     Comment: Satisfactory for evaluation. Endocervical and/or squamous metaplastic cells (endocervical component) are present.    Clinician Provided ICD10 Comment     Comment: Z12.4   Performed by: Comment     Comment: Shan Levans, Cytotechnologist (ASCP)   Note: Comment     Comment: The Pap smear is a screening test designed to aid in the detection of premalignant and malignant conditions of the uterine cervix.  It is not a diagnostic procedure and should not be used as the sole means of detecting cervical cancer.  Both false-positive and false-negative reports do occur.    Test Methodology Comment     Comment: This liquid based ThinPrep(R) pap test was screened with the use of an image guided system.    HPV Aptima Negative Negative    Comment: This nucleic acid amplification test detects fourteen high-risk HPV types (16,18,31,33,35,39,45,51,52,56,58,59,66,68) without differentiation.    Objective  Body mass index is 25.06 kg/m. Wt Readings from Last 3 Encounters:  04/18/20 160 lb (72.6 kg)  03/03/20 165 lb (74.8 kg)  08/14/15 150 lb (68 kg)   Temp Readings from Last 3 Encounters:  04/18/20 98.4 F (36.9 C) (Oral)  07/10/15 97.6 F (36.4 C)  06/15/14 98.1 F (36.7 C) (Oral)   BP Readings from Last 3 Encounters:  04/18/20 98/68  03/03/20 108/60  08/14/15 120/74   Pulse Readings from Last 3 Encounters:  04/18/20 85  08/14/15 92  07/17/15 70    Physical Exam Vitals and nursing note reviewed.  Constitutional:      Appearance: Normal appearance. She is well-developed and well-groomed.  HENT:     Head: Normocephalic and atraumatic.  Eyes:     Conjunctiva/sclera: Conjunctivae normal.     Pupils: Pupils are equal, round, and reactive to light.  Cardiovascular:     Rate and Rhythm: Normal rate and regular rhythm.     Heart sounds: Normal heart sounds. No murmur heard.   Pulmonary:      Effort: Pulmonary effort is normal.     Breath sounds: Normal breath sounds.  Chest:    Skin:    General: Skin is warm and dry.  Neurological:     General: No focal deficit present.     Mental Status: She is alert and oriented to person, place, and time. Mental status is at baseline.     Gait: Gait normal.  Psychiatric:        Attention and Perception: Attention and perception normal.        Mood and Affect: Mood and affect normal.        Speech: Speech normal.        Behavior: Behavior normal. Behavior is cooperative.        Thought Content: Thought content normal.        Cognition and Memory: Cognition normal.        Judgment: Judgment normal.     Assessment  Plan  Insect bite of right upper arm (right anterior shoulder), initial encounter ?brown recluse vs tick vs other- Plan: Comprehensive metabolic panel, CBC with Differential/Platelet, B. burgdorfi antibodies, [place] mtn spotted fvr abs pnl(IgG+IgM), triamcinolone cream (KENALOG) 0.1 %, doxycycline (VIBRA-TABS) 100 MG tablet bid x 7-10 days Prn tylenol h/a declines nsaids due to h/o ulcers with h/a Let us know if h/a and joint pain not better  Bite actually getting better from review of photos and in person visit but could consider  Tdap in future no record    Provider: Dr. Olivia Mackie McLean-Scocuzza-Internal Medicine

## 2020-04-18 NOTE — Patient Instructions (Signed)
General Headache Without Cause A headache is pain or discomfort felt around the head or neck area. The specific cause of a headache may not be found. There are many causes and types of headaches. A few common ones are:  Tension headaches.  Migraine headaches.  Cluster headaches.  Chronic daily headaches. Follow these instructions at home: Watch your condition for any changes. Let your health care provider know about them. Take these steps to help with your condition: Managing pain      Take over-the-counter and prescription medicines only as told by your health care provider.  Lie down in a dark, quiet room when you have a headache.  If directed, put ice on your head and neck area: ? Put ice in a plastic bag. ? Place a towel between your skin and the bag. ? Leave the ice on for 20 minutes, 2-3 times per day.  If directed, apply heat to the affected area. Use the heat source that your health care provider recommends, such as a moist heat pack or a heating pad. ? Place a towel between your skin and the heat source. ? Leave the heat on for 20-30 minutes. ? Remove the heat if your skin turns bright red. This is especially important if you are unable to feel pain, heat, or cold. You may have a greater risk of getting burned.  Keep lights dim if bright lights bother you or make your headaches worse. Eating and drinking  Eat meals on a regular schedule.  If you drink alcohol: ? Limit how much you use to:  0-1 drink a day for women.  0-2 drinks a day for men. ? Be aware of how much alcohol is in your drink. In the [place] one drink equals one 12 oz bottle of beer (355 mL), one 5 oz glass of wine (148 mL), or one 1 oz glass of hard liquor (44 mL).  Stop drinking caffeine, or decrease the amount of caffeine you drink. General instructions   Keep a headache journal to help find out what may trigger your headaches. For example, write down: ? What you eat and drink. ? How much  sleep you get. ? Any change to your diet or medicines.  Try massage or other relaxation techniques.  Limit stress.  Sit up straight, and do not tense your muscles.  Do not use any products that contain nicotine or tobacco, such as cigarettes, e-cigarettes, and chewing tobacco. If you need help quitting, ask your health care provider.  Exercise regularly as told by your health care provider.  Sleep on a regular schedule. Get 7-9 hours of sleep each night, or the amount recommended by your health care provider.  Keep all follow-up visits as told by your health care provider. This is important. Contact a health care provider if:  Your symptoms are not helped by medicine.  You have a headache that is different from the usual headache.  You have nausea or you vomit.  You have a fever. Get help right away if:  Your headache becomes severe quickly.  Your headache gets worse after moderate to intense physical activity.  You have repeated vomiting.  You have a stiff neck.  You have a loss of vision.  You have problems with speech.  You have pain in the eye or ear.  You have muscular weakness or loss of muscle control.  You lose your balance or have trouble walking.  You feel faint or pass out.  You have confusion.    You have a seizure. Summary  A headache is pain or discomfort felt around the head or neck area.  There are many causes and types of headaches. In some cases, the cause may not be found.  Keep a headache journal to help find out what may trigger your headaches. Watch your condition for any changes. Let your health care provider know about them.  Contact a health care provider if you have a headache that is different from the usual headache, or if your symptoms are not helped by medicine.  Get help right away if your headache becomes severe, you vomit, you have a loss of vision, you lose your balance, or you have a seizure. This information is not  intended to replace advice given to you by your health care provider. Make sure you discuss any questions you have with your health care provider. Document Revised: 03/23/2018 Document Reviewed: 03/23/2018 Elsevier Patient Education  [place], Adult An insect bite can make your skin red, itchy, and swollen. An insect bite is different from an insect sting, which happens when an insect injects poison (venom) into the skin. Some insects can spread disease to people through a bite. However, most insect bites do not lead to disease and are not serious. What are the causes? Insects may bite for a variety of reasons, including:  Hunger.  To defend themselves. Insects that bite include:  Spiders.  Mosquitoes.  Ticks.  Fleas.  Ants.  Flies.  Kissing bugs.  Chiggers. What are the signs or symptoms? Symptoms of this condition include:  Itching or pain in the bite area.  Redness and swelling in the bite area.  An open wound (skin ulcer). In many cases, symptoms last for 2-4 days. In rare cases, a person may have a severe allergic reaction (anaphylactic reaction) to a bite. Symptoms of an anaphylactic reaction may include:  Feeling warm in the face (flushed). This may include redness.  Itchy, red, swollen areas of skin (hives).  Swelling of the eyes, lips, face, mouth, tongue, or throat.  Difficulty breathing, speaking, or swallowing.  Noisy breathing (wheezing).  Dizziness or light-headedness.  Fainting.  Pain or cramping in the abdomen.  Vomiting.  Diarrhea. How is this diagnosed? This condition is usually diagnosed based on symptoms and a physical exam. How is this treated? Treatment is usually not needed. Symptoms often go away on their own. When treatment is recommended, it may involve:  Applying a cream or lotion to the bite area. This treatment helps with itching.  Taking an antibiotic medicine. This treatment is needed if the bite  area gets infected.  Getting a tetanus shot, if you are not up to date on this vaccine.  Applying ice to the affected area.  Allergy medicines called antihistamines. This treatment may be needed if you develop itching or an allergic reaction to the insect bite.  Giving yourself an epinephrine injection if you have an anaphylactic reaction to a bite. To give the injection, you will use what is commonly called an auto-injector "pen" (pre-filled automatic epinephrine injection device). Your health care provider will teach you how to use an auto-injector pen. Follow these instructions at home: Bite area care   Do not scratch the bite area.  Keep the bite area clean and dry. Wash it every day with soap and water as told by your health care provider.  Check the bite area every day for signs of infection. Check for: ? Redness, swelling, or pain. ?  Fluid or blood. ? Warmth. ? Pus or a bad smell. Managing pain, itching, and swelling   You may apply cortisone cream, calamine lotion, or a paste made of baking soda and water to the bite area as told by your health care provider.  If directed, put ice on the bite area. ? Put ice in a plastic bag. ? Place a towel between your skin and the bag. ? Leave the ice on for 20 minutes, 2-3 times a day. General instructions  Apply or take over-the-counter and prescription medicines only as told by your health care provider.  If you were prescribed an antibiotic medicine, take or apply it as told by your health care provider. Do not stop using the antibiotic even if your condition improves.  Keep all follow-up visits as told by your health care provider. This is important. How is this prevented? To help reduce your risk of insect bites:  When you are outdoors, wear clothing that covers your arms and legs. This is especially important in the early morning and evening.  Use insect repellent. The best insect repellents contain DEET, picaridin, oil of  lemon eucalyptus (OLE), or IR3535.  Consider spraying your clothing with a pesticide called permethrin. Permethrin helps prevent insect bites. It works for several weeks and for up to 5-6 clothing washes. Do not apply permethrin directly to the skin.  If your home windows do not have screens, consider installing them.  If you will be sleeping in an area where there are mosquitoes, consider covering your sleeping area with a mosquito net. Contact a health care provider if:  You have redness, swelling, or pain in the bite area.  You have fluid or blood coming from the bite area.  The bite area feels warm to the touch.  You have pus or a bad smell coming from the bite area.  You have a fever. Get help right away if:  You have joint pain.  You have a rash.  You feel unusually tired or sleepy.  You have neck pain.  You have a headache.  You have unusual weakness.  You develop symptoms of an anaphylactic reaction. These may include: ? Flushed skin. ? Hives. ? Swelling of the eyes, lips, face, mouth, tongue, or throat. ? Difficulty breathing, speaking, or swallowing. ? Wheezing. ? Dizziness or light-headedness. ? Fainting. ? Pain or cramping in the abdomen. ? Vomiting. ? Diarrhea. These symptoms may represent a serious problem that is an emergency. Do not wait to see if the symptoms will go away. Do the following right away:  Use the auto-injector pen as you have been instructed.  Get medical help. Call your local emergency services (911 in the [place] Do not drive yourself to the hospital. Summary  An insect bite can make your skin red, itchy, and swollen.  Treatment is usually not needed. Symptoms often go away on their own. When treatment is recommended, it may involve taking medicine, applying medicine to the area, or applying ice.  Apply or take over-the-counter and prescription medicines only as told by your health care provider.  Use insect repellent to help  prevent insect bites.  Contact a health care provider if you have any signs of infection in the bite area. This information is not intended to replace advice given to you by your health care provider. Make sure you discuss any questions you have with your health care provider. Document Revised: 03/13/2018 Document Reviewed: 03/13/2018 Elsevier Patient Education  [place].

## 2020-04-18 NOTE — Progress Notes (Signed)
Patient presenting with nite on the shoulder. 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 States she was unpacking boxes 3 weeks ago and felt a pinch on the shoulder. This then started itching and burning. Symptoms have been itching, burning, tingling, body aches, and headaches. This has been ongoing for 3 weeks.

## 2020-04-20 ENCOUNTER — Telehealth: Payer: Self-pay | Admitting: Family Medicine

## 2020-04-20 NOTE — Telephone Encounter (Signed)
Pt called in for results  

## 2020-04-21 LAB — COMPREHENSIVE METABOLIC PANEL
ALT: 19 IU/L (ref 0–32)
AST: 25 IU/L (ref 0–40)
Albumin/Globulin Ratio: 2 (ref 1.2–2.2)
Albumin: 4.8 g/dL (ref 3.8–4.9)
Alkaline Phosphatase: 92 IU/L (ref 48–121)
BUN/Creatinine Ratio: 32 — ABNORMAL HIGH (ref 9–23)
BUN: 24 mg/dL (ref 6–24)
Bilirubin Total: 0.7 mg/dL (ref 0.0–1.2)
CO2: 25 mmol/L (ref 20–29)
Calcium: 9.6 mg/dL (ref 8.7–10.2)
Chloride: 102 mmol/L (ref 96–106)
Creatinine, Ser: 0.74 mg/dL (ref 0.57–1.00)
GFR calc Af Amer: 104 mL/min/{1.73_m2} (ref >59)
GFR calc non Af Amer: 90 mL/min/{1.73_m2} (ref >59)
Globulin, Total: 2.4 g/dL (ref 1.5–4.5)
Glucose: 91 mg/dL (ref 65–99)
Potassium: 4 mmol/L (ref 3.5–5.2)
Sodium: 142 mmol/L (ref 134–144)
Total Protein: 7.2 g/dL (ref 6.0–8.5)

## 2020-04-21 LAB — CBC WITH DIFFERENTIAL/PLATELET
Basophils Absolute: 0 10*3/uL (ref 0.0–0.2)
Basos: 0 %
EOS (ABSOLUTE): 0 10*3/uL (ref 0.0–0.4)
Eos: 1 %
Hematocrit: 43.2 % (ref 34.0–46.6)
Hemoglobin: 14.5 g/dL (ref 11.1–15.9)
Immature Grans (Abs): 0 10*3/uL (ref 0.0–0.1)
Immature Granulocytes: 0 %
Lymphocytes Absolute: 1.7 10*3/uL (ref 0.7–3.1)
Lymphs: 33 %
MCH: 29.7 pg (ref 26.6–33.0)
MCHC: 33.6 g/dL (ref 31.5–35.7)
MCV: 89 fL (ref 79–97)
Monocytes Absolute: 0.4 10*3/uL (ref 0.1–0.9)
Monocytes: 7 %
Neutrophils Absolute: 3.1 10*3/uL (ref 1.4–7.0)
Neutrophils: 59 %
Platelets: 272 10*3/uL (ref 150–450)
RBC: 4.88 x10E6/uL (ref 3.77–5.28)
RDW: 13.2 % (ref 11.7–15.4)
WBC: 5.3 10*3/uL (ref 3.4–10.8)

## 2020-04-21 LAB — ROCKY MTN SPOTTED FVR ABS PNL(IGG+IGM)
RMSF IgG: NEGATIVE
RMSF IgM: 0.23 index (ref 0.00–0.89)

## 2020-04-21 LAB — B. BURGDORFI ANTIBODIES: Lyme IgG/IgM Ab: <0.91 {ISR} (ref 0.00–0.90)

## 2020-04-21 NOTE — Telephone Encounter (Signed)
Dr Caryl Bis has not written any results to read for patient yet. She requests any calls for today to come to her Mobile number to deliver results.

## 2020-04-24 ENCOUNTER — Telehealth: Payer: Self-pay

## 2020-04-24 DIAGNOSIS — Z131 Encounter for screening for diabetes mellitus: Secondary | ICD-10-CM

## 2020-04-24 DIAGNOSIS — W57XXXA Bitten or stung by nonvenomous insect and other nonvenomous arthropods, initial encounter: Secondary | ICD-10-CM

## 2020-04-24 DIAGNOSIS — R799 Abnormal finding of blood chemistry, unspecified: Secondary | ICD-10-CM

## 2020-04-24 DIAGNOSIS — S50869A Insect bite (nonvenomous) of unspecified forearm, initial encounter: Secondary | ICD-10-CM

## 2020-04-24 DIAGNOSIS — E785 Hyperlipidemia, unspecified: Secondary | ICD-10-CM

## 2020-04-24 NOTE — Telephone Encounter (Signed)
Before next visit patient wants her redraw labs that Dr Olivia Mackie spoke of to check her BUN/CRE, A1C and Cholesterol. All labs have to plasced for [facility] since patient is a [facility] employee.Patient would like a phone call at this number once orders have been placed. [phone] patients work phone.

## 2020-04-24 NOTE — Addendum Note (Signed)
Addended by: Leone Haven on: 04/24/2020 03:54 PM   Modules accepted: Orders

## 2020-04-24 NOTE — Telephone Encounter (Signed)
Patient was seen by Dr. Aundra Dubin; forwarding to [place] for results

## 2020-04-24 NOTE — Telephone Encounter (Signed)
Please see result note 

## 2020-04-24 NOTE — Telephone Encounter (Signed)
Left message for patient to return call back.  

## 2020-04-24 NOTE — Telephone Encounter (Signed)
Patient stated she really wants her lipids and a1c completed. She also wants her lyme and tick fever labs completed again. Please advise.

## 2020-04-24 NOTE — Telephone Encounter (Signed)
Pt called back she stated that you could call her on cell phone [phone]

## 2020-04-24 NOTE — Telephone Encounter (Signed)
The patients A1c was in the normal range in April. She just had her lipids checked in April as well. I do not think we have to repeat those tests. Her insurance may not pay for her lipid panel to be repeated that soon.  We can recheck her kidney function.  Order placed.

## 2020-04-25 NOTE — Addendum Note (Signed)
Addended by: Leone Haven on: 04/25/2020 01:43 PM   Modules accepted: Orders

## 2020-04-25 NOTE — Telephone Encounter (Signed)
Patient stated Dr Olivia Mackie told her we can recheck it to be sure at a later date and yes labs have to go to [facility]

## 2020-04-25 NOTE — Telephone Encounter (Signed)
Orders placed.

## 2020-04-25 NOTE — Telephone Encounter (Signed)
Is there a particular reason that she wants the lyme and RMSF labs done again? They were negative previously and it does not sound like she found a tick on her at the time of the insect bite. Also, do her labs need to go to [facility]?

## 2020-05-02 ENCOUNTER — Telehealth: Payer: Self-pay | Admitting: Family Medicine

## 2020-05-02 NOTE — Telephone Encounter (Signed)
Pt called in wanted to verify the lab test that is order for her can contact her at [phone]

## 2020-05-02 NOTE — Telephone Encounter (Signed)
I called the patient and received no answer not a VM.  Michelle Armstrong,cma

## 2020-05-02 NOTE — Addendum Note (Signed)
Addended by: Leeanne Rio on: 05/02/2020 03:32 PM   Modules accepted: Orders

## 2020-05-02 NOTE — Telephone Encounter (Signed)
I called and spoke with the patient and informed her that her labs were ordered for [facility] and she can go and have them drawn.  Ameer Sanden,cma

## 2020-06-02 ENCOUNTER — Ambulatory Visit (INDEPENDENT_AMBULATORY_CARE_PROVIDER_SITE_OTHER): Payer: Managed Care, Other (non HMO) | Admitting: Family Medicine

## 2020-06-02 ENCOUNTER — Other Ambulatory Visit: Payer: Self-pay

## 2020-06-02 ENCOUNTER — Encounter: Payer: Self-pay | Admitting: Family Medicine

## 2020-06-02 VITALS — BP 120/70 | HR 73 | Temp 98.9°F | Ht 67.0 in | Wt 158.0 lb

## 2020-06-02 DIAGNOSIS — E785 Hyperlipidemia, unspecified: Secondary | ICD-10-CM | POA: Diagnosis not present

## 2020-06-02 DIAGNOSIS — W57XXXD Bitten or stung by nonvenomous insect and other nonvenomous arthropods, subsequent encounter: Secondary | ICD-10-CM | POA: Diagnosis not present

## 2020-06-02 DIAGNOSIS — S20361D Insect bite (nonvenomous) of right front wall of thorax, subsequent encounter: Secondary | ICD-10-CM | POA: Diagnosis not present

## 2020-06-02 DIAGNOSIS — D1803 Hemangioma of intra-abdominal structures: Secondary | ICD-10-CM

## 2020-06-02 DIAGNOSIS — W57XXXA Bitten or stung by nonvenomous insect and other nonvenomous arthropods, initial encounter: Secondary | ICD-10-CM

## 2020-06-02 DIAGNOSIS — M810 Age-related osteoporosis without current pathological fracture: Secondary | ICD-10-CM

## 2020-06-02 DIAGNOSIS — K5900 Constipation, unspecified: Secondary | ICD-10-CM

## 2020-06-02 DIAGNOSIS — Z23 Encounter for immunization: Secondary | ICD-10-CM

## 2020-06-02 DIAGNOSIS — R229 Localized swelling, mass and lump, unspecified: Secondary | ICD-10-CM | POA: Diagnosis not present

## 2020-06-02 HISTORY — DX: Bitten or stung by nonvenomous insect and other nonvenomous arthropods, initial encounter: W57.XXXA

## 2020-06-02 LAB — BASIC METABOLIC PANEL
BUN/Creatinine Ratio: 25 — ABNORMAL HIGH (ref 9–23)
BUN: 20 mg/dL (ref 6–24)
CO2: 25 mmol/L (ref 20–29)
Calcium: 9.8 mg/dL (ref 8.7–10.2)
Chloride: 102 mmol/L (ref 96–106)
Creatinine, Ser: 0.79 mg/dL (ref 0.57–1.00)
GFR calc Af Amer: 96 mL/min/{1.73_m2} (ref >59)
GFR calc non Af Amer: 83 mL/min/{1.73_m2} (ref >59)
Glucose: 82 mg/dL (ref 65–99)
Potassium: 4.4 mmol/L (ref 3.5–5.2)
Sodium: 142 mmol/L (ref 134–144)

## 2020-06-02 LAB — LIPID PANEL
Chol/HDL Ratio: 2.2 ratio (ref 0.0–4.4)
Cholesterol, Total: 218 mg/dL — ABNORMAL HIGH (ref 100–199)
HDL: 98 mg/dL (ref >39)
LDL Chol Calc (NIH): 109 mg/dL — ABNORMAL HIGH (ref 0–99)
Triglycerides: 60 mg/dL (ref 0–149)
VLDL Cholesterol Cal: 11 mg/dL (ref 5–40)

## 2020-06-02 LAB — B. BURGDORFI ANTIBODIES: Lyme IgG/IgM Ab: <0.91 {ISR} (ref 0.00–0.90)

## 2020-06-02 LAB — ROCKY MTN SPOTTED FVR ABS PNL(IGG+IGM)
RMSF IgG: NEGATIVE
RMSF IgM: 0.28 index (ref 0.00–0.89)

## 2020-06-02 LAB — HEMOGLOBIN A1C
Est. average glucose Bld gHb Est-mCnc: 108 mg/dL
Hgb A1c MFr Bld: 5.4 % (ref 4.8–5.6)

## 2020-06-02 MED ORDER — POLYETHYLENE GLYCOL 3350 17 GM/SCOOP PO POWD
17.0000 g | Freq: Every day | ORAL | 0 refills | Status: DC | PRN
Start: 2020-06-02 — End: 2020-08-17

## 2020-06-02 NOTE — Patient Instructions (Signed)
Nice to see you. Please try MiraLAX one scoop once daily.  If you do not have a good bowel movement within 3 days you can increase to two scoops daily. Please monitor the bump on the back of your left arm.  If it worsens please let us know. I would recommend follow-up MRI for your liver hemangiomas.

## 2020-06-02 NOTE — Assessment & Plan Note (Signed)
Minimally elevated LDL.  ASCVD risk score does not meet criteria for treatment.  She will continue to work on diet and exercise.

## 2020-06-02 NOTE — Assessment & Plan Note (Signed)
Resolved.  She has had negative RMSF and Lyme testing.

## 2020-06-02 NOTE — Assessment & Plan Note (Signed)
Recommend medication given risk of fracture.  She declines this.  She will continue with calcium and vitamin D supplementation.

## 2020-06-02 NOTE — Assessment & Plan Note (Signed)
Discussed a trial of MiraLAX 1 scoop once daily for 3 days and if not improving should increase to 2 scoops once daily.  If that is not beneficial she will let us know.  She will keep her appointment for her colonoscopy.

## 2020-06-02 NOTE — Assessment & Plan Note (Signed)
I initially discussed obtaining an MRI with the patient though based on further review of her chart it looks like she is had multiple imaging studies of these through the years without any significant growth.  Based on review of up-to-date it seems that she does not need further imaging as she has been asymptomatic.  This will be relayed to the patient.

## 2020-06-02 NOTE — Assessment & Plan Note (Signed)
Suspect likely cyst.  Very small in size.  She will monitor and if it changes at all she will let us know.

## 2020-06-02 NOTE — Progress Notes (Signed)
Michelle Rumps, MD Phone: [phone]  Michelle Armstrong is a 59 y.o. female who presents today for follow-up.  Elevated LDL: Patient follows a low-carb high-protein diet.  Drinks lots of water.  Walks several miles a day.  Does not need medication based on ASCVD risk score being below 5%.  Insect bite: Patient notes she never saw what bit her though it was on her right chest.  Notes she had a large red area and was evaluated in our office for this previously.  Notes there was pain with this.  Notes it has resolved.  She had several negative [facility] spotted fever tests as well as Lyme testing.  Osteoporosis: Noted from most recent bone density scan.  She does take calcium and vitamin D.  She does not want medication for this.  Subcutaneous nodule: This is on the back of her left arm.  Notes it is quite small though is tender to palpation.  Does not hurt otherwise.  She just noticed this.  Constipation: Notes its been much more difficult to have bowel movements in her 64s.  She has small balls of stool.  She has tried Stage manager and collagen peptide.  She has a small hemorrhoid that is become an issue at times.  She does have a colonoscopy scheduled in the next month.  Liver hemangioma: Notes she has had these previously on imaging.  She wonders if she needs any follow-up.  Social History   Tobacco Use  Smoking Status Never Smoker  Smokeless Tobacco Never Used  Tobacco Comment   tobacco use- no      ROS see history of present illness  Objective  Physical Exam Vitals:   06/02/20 1402  BP: 120/70  Pulse: 73  Temp: 98.9 F (37.2 C)  SpO2: 96%    BP Readings from Last 3 Encounters:  06/02/20 120/70  04/18/20 98/68  03/03/20 108/60   Wt Readings from Last 3 Encounters:  06/02/20 158 lb (71.7 kg)  04/18/20 160 lb (72.6 kg)  03/03/20 165 lb (74.8 kg)    Physical Exam Constitutional:      General: She is not in acute distress.    Appearance: She is not  diaphoretic.  Cardiovascular:     Rate and Rhythm: Normal rate and regular rhythm.     Heart sounds: Normal heart sounds.  Pulmonary:     Effort: Pulmonary effort is normal.     Breath sounds: Normal breath sounds.  Skin:    General: Skin is warm and dry.       Neurological:     Mental Status: She is alert.      Assessment/Plan: Please see individual problem list.  Hyperlipidemia Minimally elevated LDL.  ASCVD risk score does not meet criteria for treatment.  She will continue to work on diet and exercise.  Insect bite Resolved.  She has had negative RMSF and Lyme testing.  Subcutaneous nodule Suspect likely cyst.  Very small in size.  She will monitor and if it changes at all she will let us know.  Hepatic hemangioma I initially discussed obtaining an MRI with the patient though based on further review of her chart it looks like she is had multiple imaging studies of these through the years without any significant growth.  Based on review of up-to-date it seems that she does not need further imaging as she has been asymptomatic.  This will be relayed to the patient.  Osteoporosis of lumbar spine Recommend medication given risk of fracture.  She declines this.  She will continue with calcium and vitamin D supplementation.  Constipation Discussed a trial of MiraLAX 1 scoop once daily for 3 days and if not improving should increase to 2 scoops once daily.  If that is not beneficial she will let us know.  She will keep her appointment for her colonoscopy.   Orders Placed This Encounter  Procedures  . Varicella-zoster vaccine IM (Shingrix)    Meds ordered this encounter  Medications  . polyethylene glycol powder (GLYCOLAX/MIRALAX) 17 GM/SCOOP powder    Sig: Take 17 g by mouth daily as needed for mild constipation.    Dispense:  500 g    Refill:  0    [name] was seen today for follow-up.  Diagnoses and all orders for this visit:  Need for shingles vaccine -      Varicella-zoster vaccine IM (Shingrix)  Hyperlipidemia, unspecified hyperlipidemia type  Insect bite of right front wall of thorax, subsequent encounter  Subcutaneous nodule  Hepatic hemangioma  Osteoporosis of lumbar spine  Constipation, unspecified constipation type  Other orders -     polyethylene glycol powder (GLYCOLAX/MIRALAX) 17 GM/SCOOP powder; Take 17 g by mouth daily as needed for mild constipation.     This visit occurred during the SARS-CoV-2 public health emergency.  Safety protocols were in place, including screening questions prior to the visit, additional usage of staff PPE, and extensive cleaning of exam room while observing appropriate contact time as indicated for disinfecting solutions.    Michelle Rumps, MD [place]

## 2020-06-06 NOTE — Progress Notes (Signed)
I called and spoke with the patient and informed her that the provider stated that due to her Hemangiomas being stable he feels she does not need any further imaging tests at this time. Patient understood.  Keyasha Miah,cma

## 2020-08-14 ENCOUNTER — Telehealth: Payer: Self-pay

## 2020-08-14 NOTE — Telephone Encounter (Signed)
Pt states she noticed in her chart that on a result note 06/02/20 that she declined medication for risk of fracture due to osteoporosis of lumbar spine. She would like for it to show the reason she declined and that she was not trying not to be compliant. She said she is at risk for blood clots and she feels this should be mentioned. Alexas Basulto,cma

## 2020-08-14 NOTE — Telephone Encounter (Signed)
Pt states she noticed in her chart that on a result note 06/02/20 that she declined medication for risk of fracture due to osteoporosis of lumbar spine. She would like for it to show the reason she declined and that she was not trying not to be compliant. She said she is at risk for blood clots and she feels this should be mentioned.   She also wants doxycycline and triamcinolone medications removed from her medication list since she no longer take them.

## 2020-08-14 NOTE — Telephone Encounter (Signed)
It is in the note that she declined the medication.  It does not state why only that it was declined.  Jedd Schulenburg,cma

## 2020-08-14 NOTE — Telephone Encounter (Signed)
Is this in the office visit note from 06/02/20? I have noted her concern, though there is no listed side effect of blood clots for fosamax or prolia which are two commonly used medications for osteoporosis.

## 2020-08-15 NOTE — Telephone Encounter (Signed)
Noted. Please call her and counsel her on the information in my prior note. I can addend my office note once that has been completed.

## 2020-08-17 ENCOUNTER — Other Ambulatory Visit: Payer: Self-pay

## 2020-08-17 ENCOUNTER — Ambulatory Visit (INDEPENDENT_AMBULATORY_CARE_PROVIDER_SITE_OTHER): Payer: Managed Care, Other (non HMO)

## 2020-08-17 DIAGNOSIS — Z23 Encounter for immunization: Secondary | ICD-10-CM | POA: Diagnosis not present

## 2020-08-17 NOTE — Progress Notes (Signed)
Patient presented for Shingrix injection to left deltoid, patient voiced no concerns nor showed any signs of distress during injection.

## 2020-08-17 NOTE — Addendum Note (Signed)
Addended byElpidio Galea T on: 08/17/2020 04:48 PM   Modules accepted: Orders

## 2020-09-01 ENCOUNTER — Telehealth: Payer: Self-pay

## 2020-09-01 NOTE — Telephone Encounter (Signed)
I called the patient and informed her that her immunization record was ready for pickup at the front and she understood.  Michelle Armstrong,cma

## 2020-09-01 NOTE — Telephone Encounter (Signed)
Pt is trying to complete [employer] for [employer] for a job and she needs a copy of her immunization records to pick up today. She is asking we call when it is ready.

## 2020-09-05 NOTE — Telephone Encounter (Signed)
Pt would like a call back about her Vaccine record

## 2020-09-07 NOTE — Telephone Encounter (Signed)
I called the patient and informed her that her new vaccine record with her added vaccines was ready for pickup at the front desk.  Michelle Armstrong,cma

## 2020-09-13 ENCOUNTER — Telehealth: Payer: Self-pay | Admitting: Family Medicine

## 2020-09-13 NOTE — Telephone Encounter (Signed)
Pt was around her son on Sunday and he tested positive for COVID today  She is having headache, sore throat   Pt just wants to come for a COVID test not an appt   Pt said that she didn't want to go to [facility] but would see if [facility] or [facility] had any appts to be tested

## 2020-09-14 ENCOUNTER — Other Ambulatory Visit: Payer: Self-pay

## 2020-09-14 ENCOUNTER — Telehealth (INDEPENDENT_AMBULATORY_CARE_PROVIDER_SITE_OTHER): Payer: Managed Care, Other (non HMO) | Admitting: Family Medicine

## 2020-09-14 ENCOUNTER — Telehealth: Payer: Self-pay | Admitting: Family Medicine

## 2020-09-14 ENCOUNTER — Other Ambulatory Visit (INDEPENDENT_AMBULATORY_CARE_PROVIDER_SITE_OTHER): Payer: Managed Care, Other (non HMO)

## 2020-09-14 ENCOUNTER — Encounter: Payer: Self-pay | Admitting: Family Medicine

## 2020-09-14 VITALS — Ht 67.0 in | Wt 157.0 lb

## 2020-09-14 DIAGNOSIS — Z20822 Contact with and (suspected) exposure to covid-19: Secondary | ICD-10-CM | POA: Diagnosis not present

## 2020-09-14 DIAGNOSIS — R0989 Other specified symptoms and signs involving the circulatory and respiratory systems: Secondary | ICD-10-CM | POA: Diagnosis not present

## 2020-09-14 DIAGNOSIS — R059 Cough, unspecified: Secondary | ICD-10-CM | POA: Diagnosis not present

## 2020-09-14 DIAGNOSIS — R051 Acute cough: Secondary | ICD-10-CM | POA: Insufficient documentation

## 2020-09-14 HISTORY — DX: Cough, unspecified: R05.9

## 2020-09-14 NOTE — Progress Notes (Addendum)
Virtual Visit via telephone Note  This visit type was conducted due to national recommendations for restrictions regarding the COVID-19 pandemic (e.g. social distancing).  This format is felt to be most appropriate for this patient at this time.  All issues noted in this document were discussed and addressed.  No physical exam was performed (except for noted visual exam findings with Video Visits).   I connected with Michelle Armstrong today at  3:00 PM EST by telephone and verified that I am speaking with the correct person using two identifiers. Location patient: home Location provider: work Persons participating in the virtual visit: patient, provider  I discussed the limitations, risks, security and privacy concerns of performing an evaluation and management service by telephone and the availability of in person appointments. I also discussed with the patient that there may be a patient responsible charge related to this service. The patient expressed understanding and agreed to proceed.  Interactive audio and video telecommunications were attempted between this provider and patient, however failed, due to patient having technical difficulties OR patient did not have access to video capability.  We continued and completed visit with audio only.   Reason for visit: same day visit.   HPI: Cough: Patient notes onset of symptoms on 09/11/2020.  She saw her son and his girlfriend this past weekend and they tested positive earlier this week.  Patient notes cough, sinus congestion, fatigue, sore throat, and chills.  She notes no fevers.  She does note she feels achy and her ears hurt.  No loss of taste or smell.  Did have Covid exposures.  She is vaccinated against COVID-19 though has not received her booster.  She continues Mucinex and Tylenol.  Her husband was also exposed but has had no symptoms.  He has not received steroids vaccine.   ROS: See pertinent positives and negatives per  HPI.  Past Medical History:  Diagnosis Date  . Clotting disorder (HCC)    pt states she has a mutated gene that predisposes her to clotting  . Early menopause    age [age]  . HLD (hyperlipidemia)   . Hypothyroidism    possible h/o  . Liver hemangioma    per patient  . Osteopenia 2016, 2018   spine/hip; DEXA at [facility]; 2018-hip  . Osteoporosis 2018   spine; DEXA at [facility]  . 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 02/2010 Echo: EF 55-60%, no rwma.  . Vitamin D deficiency    low    Past Surgical History:  Procedure Laterality Date  . ABDOMINAL HYSTERECTOMY  1999   Total lap hyst BSO due to endometriosis  . CARDIAC ELECTROPHYSIOLOGY MAPPING AND ABLATION  2011  . COLONOSCOPY WITH PROPOFOL N/A 07/10/2015   Procedure: COLONOSCOPY WITH PROPOFOL;  Surgeon: Christena Deem, MD;  Location: [facility] ENDOSCOPY;  Service: Endoscopy;  Laterality: N/A;  . GALLBLADDER SURGERY    . HERNIA REPAIR  12/2010  . LEFT HEART CATH AND CORONARY ANGIOGRAPHY N/A 02/24/2017   Procedure: Left Heart Cath and Coronary Angiography;  Surgeon: Iran Ouch, MD;  Location: [facility] INVASIVE CV LAB;  Service: Cardiovascular;  Laterality: N/A;  . TONSILLECTOMY      Family History  Problem Relation Age of Onset  . Heart disease Father        s/p CABG  . Obesity Father   . Hyperlipidemia Father   . Bladder Cancer Father   . Drug abuse Father   .  Thyroid disease Sister   . Clotting disorder Sister        gene mutation  . Breast cancer Neg Hx     SOCIAL HX: Non-smoker   Current Outpatient Medications:  .  Ascorbic Acid (VITAMIN C PO), Take by mouth., Disp: , Rfl:  .  Calcium Carbonate 500 MG CHEW, Chew 1 tablet by mouth daily., Disp: , Rfl:  .  cholecalciferol (VITAMIN D) 1000 units tablet, Take by mouth daily., Disp: , Rfl:  .  Ferrous Sulfate (IRON PO), Take by mouth., Disp: , Rfl:  .  Multiple Vitamins-Minerals (IMMUNE SUPPORT PO), Take by mouth., Disp: , Rfl:  .   Multiple Vitamins-Minerals (MULTIVITAMIN PO), Take 1 tablet by mouth daily., Disp: , Rfl:  .  PE-diphenhydrAMINE-DM-GG-APAP (MUCINEX FAST-MAX CLD/FLU DY/NT PO), Take by mouth., Disp: , Rfl:  .  Thiamine HCl (VITAMIN B-1 PO), Take by mouth., Disp: , Rfl:  .  Acetaminophen (TYLENOL PO), Take by mouth. (Patient not taking: Reported on 09/14/2020), Disp: , Rfl:   EXAM: This was a telephone visit and thus no physical exam was completed.  ASSESSMENT AND PLAN:  Discussed the following assessment and plan:  Problem List Items Addressed This Visit    Close exposure to COVID-19 virus   Cough - Primary    I suspect patient has COVID-19 given her symptoms and exposure.  She has already been tested for this earlier today.  Discussed that treatment is supportive care at this point as she would not meet criteria for the monoclonal antibody infusion. She can continue tylenol and mucinex as needed. Discussed strict quarantine precautions and advised her of the new CDC guidelines for quarantine. I also advised that her husband should quarantine as well and that he should be tested at day 5 after exposure. If he develops symptoms he should be tested sooner. Advised to see medical attention for chest pain, shortness of breath, and high fevers.           I discussed the assessment and treatment plan with the patient. The patient was provided an opportunity to ask questions and all were answered. The patient agreed with the plan and demonstrated an understanding of the instructions.   The patient was advised to call back or seek an in-person evaluation if the symptoms worsen or if the condition fails to improve as anticipated.  I provided 15 minutes of non-face-to-face time during this encounter.   Marikay Alar, MD

## 2020-09-14 NOTE — Telephone Encounter (Signed)
Patient would like a order to [facility] to have a COVID test done. Patient states her manger at [facility] is requesting to have it done there since patient can not find any where else to go, until next week. Patient's manager does not want her to wait that long because employees and family need to be notified. Please call her at [phone].

## 2020-09-14 NOTE — Telephone Encounter (Signed)
I called patient & she really just wanted Covid test ordered for [facility]. I did order for patient & asked that she call to make sure that [facility] can see. She stated that she can go to patient service center to be tested. I told patient she still needed a visit as patient is so sick that she stated that she couldn't even drive. She is scheduled for VV at 3p with Dr. Darrick Huntsman.

## 2020-09-14 NOTE — Telephone Encounter (Signed)
Calling to start 3 pm virtual visit. Call went straight to voicemail. Left message to return call.

## 2020-09-14 NOTE — Telephone Encounter (Signed)
Patient answered second call

## 2020-09-14 NOTE — Progress Notes (Signed)
Patient was around some one tested positive yesterday. Patient having chills, congestion, cough, fatigue, weakness, sore throat.

## 2020-09-14 NOTE — Assessment & Plan Note (Signed)
I suspect patient has COVID-19 given her symptoms and exposure.  She has already been tested for this earlier today.  Discussed that treatment is supportive care at this point as she would not meet criteria for the monoclonal antibody infusion. She can continue tylenol and mucinex as needed. Discussed strict quarantine precautions and advised her of the new CDC guidelines for quarantine. I also advised that her husband should quarantine as well and that he should be tested at day 5 after exposure. If he develops symptoms he should be tested sooner. Advised to see medical attention for chest pain, shortness of breath, and high fevers.

## 2020-09-17 LAB — COVID-19, FLU A+B AND RSV
Influenza A, NAA: NOT DETECTED
Influenza B, NAA: NOT DETECTED
RSV, NAA: NOT DETECTED
SARS-CoV-2, NAA: NOT DETECTED

## 2020-09-18 ENCOUNTER — Other Ambulatory Visit: Payer: Managed Care, Other (non HMO)

## 2020-09-29 ENCOUNTER — Telehealth: Payer: Self-pay | Admitting: Family Medicine

## 2020-09-29 DIAGNOSIS — Z20822 Contact with and (suspected) exposure to covid-19: Secondary | ICD-10-CM | POA: Insufficient documentation

## 2020-09-29 HISTORY — DX: Contact with and (suspected) exposure to covid-19: Z20.822

## 2020-09-29 NOTE — Telephone Encounter (Signed)
I called the patient and informed her that the provider added covid exposure to the DX code and she understood.  Michelle Armstrong,cma

## 2020-09-29 NOTE — Telephone Encounter (Signed)
Noted.  The patient was exposed to COVID-19 and this is documented in the note.  I added close exposure to COVID-19 to the diagnosis codes for the encounter.  Please let the patient know this.

## 2020-09-29 NOTE — Telephone Encounter (Signed)
    Pt called to get the ICD code changed for her Covid test and visit so that insurance will cover it  Pt had a representative from [place] on the line and we were both trying to explain to her about the coding and she was getting upset and saying that she knows how to code and bill and this needs to be fixed. Another call came in on patient phone and our call disconnected    Called patient back and we discussed further    Pt said that the ICD code should be Z20.822  Judene Logue,cma

## 2020-09-29 NOTE — Telephone Encounter (Signed)
Pt called to get the ICD code changed for her Covid test and visit so that insurance will cover it  Pt had a representative from [place] on the line and we were both trying to explain to her about the coding and she was getting upset and saying that she knows how to code and bill and this needs to be fixed. Another call came in on patient phone and our call disconnected   Called patient back and we discussed further   Pt said that the ICD code should be Z20.822

## 2020-11-14 ENCOUNTER — Other Ambulatory Visit: Payer: Self-pay

## 2020-11-14 ENCOUNTER — Ambulatory Visit: Payer: Managed Care, Other (non HMO) | Admitting: Dermatology

## 2020-11-14 DIAGNOSIS — D2239 Melanocytic nevi of other parts of face: Secondary | ICD-10-CM

## 2020-11-14 DIAGNOSIS — L853 Xerosis cutis: Secondary | ICD-10-CM

## 2020-11-14 DIAGNOSIS — L299 Pruritus, unspecified: Secondary | ICD-10-CM | POA: Diagnosis not present

## 2020-11-14 DIAGNOSIS — L859 Epidermal thickening, unspecified: Secondary | ICD-10-CM

## 2020-11-14 DIAGNOSIS — D1724 Benign lipomatous neoplasm of skin and subcutaneous tissue of left leg: Secondary | ICD-10-CM

## 2020-11-14 DIAGNOSIS — L814 Other melanin hyperpigmentation: Secondary | ICD-10-CM

## 2020-11-14 DIAGNOSIS — D1800 Hemangioma unspecified site: Secondary | ICD-10-CM

## 2020-11-14 DIAGNOSIS — D2262 Melanocytic nevi of left upper limb, including shoulder: Secondary | ICD-10-CM | POA: Diagnosis not present

## 2020-11-14 DIAGNOSIS — I8390 Asymptomatic varicose veins of unspecified lower extremity: Secondary | ICD-10-CM

## 2020-11-14 DIAGNOSIS — D229 Melanocytic nevi, unspecified: Secondary | ICD-10-CM

## 2020-11-14 MED ORDER — MOMETASONE FUROATE 0.1 % EX CREA: 1.0000 "application " | TOPICAL_CREAM | Freq: Every day | CUTANEOUS | 0 refills | Status: DC | PRN

## 2020-11-14 NOTE — Progress Notes (Signed)
   Follow-Up Visit   Subjective  Michelle Armstrong is a 60 y.o. female who presents for the following: Spot Check (Pt has a few spots all over the body that she would like looked at. ).  Itchy spots comes and goes on her arms.  Moles on face to check, sometimes sore.    The following portions of the chart were reviewed this encounter and updated as appropriate:      Review of Systems: No other skin or systemic complaints except as noted in HPI or Assessment and Plan.   Objective  Well appearing patient in no apparent distress; mood and affect are within normal limits.  A focused examination was performed including face, arms, chest, hands, legs, feet. Relevant physical exam findings are noted in the Assessment and Plan.  Objective  b/l hands, upper arms: diffuse xerotic patches  Objective  right mid cheek, left posterior shoulder: 4 mm flesh papule at right mid cheek  4 mm medium speckled brown papule at left posterior shoulder  Objective  left upper knee: Rubbery nodule  Objective  b/l heels: Thickened dry scaly skin on heels  Assessment & Plan  Xerosis of skin b/l hands, upper arms  With Pruritus due to chronically dry skin  Start Mometasone cream. Apply to aa's QD/BID.   Recommend mild soap and moisturizing cream 1-2 times daily.  Dry skin care handout given  Topical steroids (such as triamcinolone, fluocinolone, fluocinonide, mometasone, clobetasol, halobetasol, betamethasone, hydrocortisone) can cause thinning and lightening of the skin if they are used for too long in the same area. Your physician has selected the right strength medicine for your problem and area affected on the body. Please use your medication only as directed by your physician to prevent side effects.    mometasone (ELOCON) 0.1 % cream - b/l hands, upper arms  Nevus right mid cheek, left posterior shoulder  Benign-appearing.  Observation.  Call clinic for new or changing moles.   Recommend daily use of broad spectrum spf 30+ sunscreen to sun-exposed areas.   Can be removed in future if irritated. Discussed resulting scar  Lipoma of left lower extremity left upper knee  Benign, observe.    Hyperkeratosis b/l heels  Recommend Amlactin Foot cream, CeraVe SA cream, or Eucerin roughness relief cream twice daily  Hemangiomas - Red papules - Discussed benign nature - Observe - Call for any changes  Lentigines - Scattered tan macules - Due to sun exposure - Benign-appering, observe - Recommend daily broad spectrum sunscreen SPF 30+ to sun-exposed areas, reapply every 2 hours as needed. - Call for any changes  Melanocytic Nevi - Tan-brown and/or pink-flesh-colored symmetric macules and papules - Benign appearing on exam today - Observation - Call clinic for new or changing moles - Recommend daily use of broad spectrum spf 30+ sunscreen to sun-exposed areas.   Varicose Veins/Spider veins - Dilated blue, purple or red veins at the lower extremities - Reassured - These can be treated by sclerotherapy (a procedure to inject a medicine into the veins to make them disappear) if desired, but the treatment is not covered by insurance    Return if symptoms worsen or fail to improve.  I, Harriett Sine, CMA, am acting as scribe for Brendolyn Patty, MD.  Documentation: I have reviewed the above documentation for accuracy and completeness, and I agree with the above.  Brendolyn Patty MD

## 2020-11-14 NOTE — Patient Instructions (Addendum)

## 2020-12-15 ENCOUNTER — Other Ambulatory Visit: Payer: Self-pay

## 2020-12-15 ENCOUNTER — Ambulatory Visit
Admission: RE | Admit: 2020-12-15 | Discharge: 2020-12-15 | Disposition: A | Discharge status: Home / Self Care | Payer: Managed Care, Other (non HMO) | Source: Ambulatory Visit | Attending: Obstetrics and Gynecology | Admitting: Obstetrics and Gynecology

## 2020-12-15 DIAGNOSIS — Z1231 Encounter for screening mammogram for malignant neoplasm of breast: Secondary | ICD-10-CM | POA: Insufficient documentation

## 2020-12-21 ENCOUNTER — Other Ambulatory Visit: Payer: Self-pay | Admitting: Obstetrics and Gynecology

## 2020-12-21 DIAGNOSIS — Z1321 Encounter for screening for nutritional disorder: Secondary | ICD-10-CM

## 2020-12-21 DIAGNOSIS — Z1329 Encounter for screening for other suspected endocrine disorder: Secondary | ICD-10-CM

## 2020-12-21 DIAGNOSIS — Z0000 Encounter for general adult medical examination without abnormal findings: Secondary | ICD-10-CM

## 2020-12-21 DIAGNOSIS — D649 Anemia, unspecified: Secondary | ICD-10-CM

## 2020-12-21 DIAGNOSIS — Z9884 Bariatric surgery status: Secondary | ICD-10-CM

## 2020-12-21 DIAGNOSIS — Z13220 Encounter for screening for lipoid disorders: Secondary | ICD-10-CM

## 2020-12-21 DIAGNOSIS — Z131 Encounter for screening for diabetes mellitus: Secondary | ICD-10-CM

## 2020-12-21 NOTE — Progress Notes (Signed)
Yearly lab orders placed.

## 2021-01-12 ENCOUNTER — Other Ambulatory Visit: Payer: Managed Care, Other (non HMO)

## 2021-01-12 ENCOUNTER — Other Ambulatory Visit: Payer: Self-pay

## 2021-01-12 DIAGNOSIS — Z0000 Encounter for general adult medical examination without abnormal findings: Secondary | ICD-10-CM

## 2021-01-12 DIAGNOSIS — Z9884 Bariatric surgery status: Secondary | ICD-10-CM

## 2021-01-12 DIAGNOSIS — Z13220 Encounter for screening for lipoid disorders: Secondary | ICD-10-CM

## 2021-01-12 DIAGNOSIS — D649 Anemia, unspecified: Secondary | ICD-10-CM

## 2021-01-12 DIAGNOSIS — Z1329 Encounter for screening for other suspected endocrine disorder: Secondary | ICD-10-CM

## 2021-01-12 DIAGNOSIS — Z131 Encounter for screening for diabetes mellitus: Secondary | ICD-10-CM

## 2021-01-12 DIAGNOSIS — Z1321 Encounter for screening for nutritional disorder: Secondary | ICD-10-CM

## 2021-01-13 LAB — CBC WITH DIFFERENTIAL/PLATELET
Basophils Absolute: 0 10*3/uL (ref 0.0–0.2)
Basos: 0 %
EOS (ABSOLUTE): 0.1 10*3/uL (ref 0.0–0.4)
Eos: 2 %
Hematocrit: 41.4 % (ref 34.0–46.6)
Hemoglobin: 13.8 g/dL (ref 11.1–15.9)
Immature Grans (Abs): 0 10*3/uL (ref 0.0–0.1)
Immature Granulocytes: 0 %
Lymphocytes Absolute: 1.7 10*3/uL (ref 0.7–3.1)
Lymphs: 43 %
MCH: 29.2 pg (ref 26.6–33.0)
MCHC: 33.3 g/dL (ref 31.5–35.7)
MCV: 88 fL (ref 79–97)
Monocytes Absolute: 0.4 10*3/uL (ref 0.1–0.9)
Monocytes: 9 %
Neutrophils Absolute: 1.8 10*3/uL (ref 1.4–7.0)
Neutrophils: 46 %
Platelets: 268 10*3/uL (ref 150–450)
RBC: 4.73 x10E6/uL (ref 3.77–5.28)
RDW: 13.1 % (ref 11.7–15.4)
WBC: 4 10*3/uL (ref 3.4–10.8)

## 2021-01-13 LAB — LIPID PANEL
Chol/HDL Ratio: 2.1 ratio (ref 0.0–4.4)
Cholesterol, Total: 250 mg/dL — ABNORMAL HIGH (ref 100–199)
HDL: 120 mg/dL (ref >39)
LDL Chol Calc (NIH): 121 mg/dL — ABNORMAL HIGH (ref 0–99)
Triglycerides: 54 mg/dL (ref 0–149)
VLDL Cholesterol Cal: 9 mg/dL (ref 5–40)

## 2021-01-13 LAB — COMPREHENSIVE METABOLIC PANEL
ALT: 30 IU/L (ref 0–32)
AST: 33 IU/L (ref 0–40)
Albumin/Globulin Ratio: 2 (ref 1.2–2.2)
Albumin: 4.7 g/dL (ref 3.8–4.9)
Alkaline Phosphatase: 90 IU/L (ref 44–121)
BUN/Creatinine Ratio: 25 — ABNORMAL HIGH (ref 9–23)
BUN: 19 mg/dL (ref 6–24)
Bilirubin Total: 0.9 mg/dL (ref 0.0–1.2)
CO2: 25 mmol/L (ref 20–29)
Calcium: 9.6 mg/dL (ref 8.7–10.2)
Chloride: 100 mmol/L (ref 96–106)
Creatinine, Ser: 0.75 mg/dL (ref 0.57–1.00)
Globulin, Total: 2.4 g/dL (ref 1.5–4.5)
Glucose: 85 mg/dL (ref 65–99)
Potassium: 4.2 mmol/L (ref 3.5–5.2)
Sodium: 140 mmol/L (ref 134–144)
Total Protein: 7.1 g/dL (ref 6.0–8.5)
eGFR: 92 mL/min/{1.73_m2} (ref >59)

## 2021-01-13 LAB — MAGNESIUM: Magnesium: 2.2 mg/dL (ref 1.6–2.3)

## 2021-01-13 LAB — HEMOGLOBIN A1C
Est. average glucose Bld gHb Est-mCnc: 114 mg/dL
Hgb A1c MFr Bld: 5.6 % (ref 4.8–5.6)

## 2021-01-13 LAB — IRON,TIBC AND FERRITIN PANEL
Ferritin: 27 ng/mL (ref 15–150)
Iron Saturation: 27 % (ref 15–55)
Iron: 108 ug/dL (ref 27–159)
Total Iron Binding Capacity: 403 ug/dL (ref 250–450)
UIBC: 295 ug/dL (ref 131–425)

## 2021-01-13 LAB — VITAMIN D 25 HYDROXY (VIT D DEFICIENCY, FRACTURES): Vit D, 25-Hydroxy: 53.3 ng/mL (ref 30.0–100.0)

## 2021-01-13 LAB — THYROID PANEL WITH TSH
Free Thyroxine Index: 1.7 (ref 1.2–4.9)
T3 Uptake Ratio: 26 % (ref 24–39)
T4, Total: 6.5 ug/dL (ref 4.5–12.0)
TSH: 2.29 u[IU]/mL (ref 0.450–4.500)

## 2021-01-13 LAB — B12 AND FOLATE PANEL
Folate: >20 ng/mL (ref >3.0)
Vitamin B-12: 668 pg/mL (ref 232–1245)

## 2021-01-13 LAB — SARS-COV-2 SEMI-QUANTITATIVE TOTAL ANTIBODY, SPIKE: SARS-CoV-2 Spike Ab Interp: POSITIVE

## 2021-01-13 LAB — AMYLASE: Amylase: 76 U/L (ref 31–110)

## 2021-01-13 LAB — SARS-COV-2 SPIKE AB DILUTION: SARS-CoV-2 Spike Ab Dilution: >25000 U/mL (ref <0.8)

## 2021-01-24 NOTE — Progress Notes (Signed)
Chief Complaint  Patient presents with  . Gynecologic Exam  . [facility] Employee     HPI:      [name] is a 60 y.o. G2P0011 who LMP was No LMP recorded. Patient has had a hysterectomy., presents today for her annual examination. Her menses are absent due to total lap hyst BSO due to endometriosis 1999.She does nothave PMB. No vasomotor sx.  Sex activity:single partner, contraception -status post hysterectomy. Shedoeshave vaginal dryness, improved with ThermaVi procedure and lubricants with some relief.No longer having vaginal bleeding with sex. Did oral HRT in past and didn't like it, has declined vag ERT. Has gene for clotting disorder.  Last Pap:6/18/21Results were: no abnormalities. Neg HPV DNA 2021.  Pt likes yearly paps (gets reimbursed from [facility]) Hx of STDs:none  Last mammogram:4/1/22Results were: normal--routine follow-up in 12 months There is no FH of breast cancer. There is no FH of ovarian cancer. The patientdoesdo self-breast exams.  Colonoscopy:colonoscopy10/21 withoutabnormalities, with [facility]. Repeat due after 5 yrs.  DEXA:[facility] at [facility], 2018 at [facility]. 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 Taking ca/Vit D.  Tobacco use:The patient denies current or previous tobacco use. Alcohol GEX:BMWU No drug use Exercise:moderately active  Shedoesget adequate calcium and Vitamin D in her diet. Recent labs 4/22 WNL. Total chol borderline but HDL is 120. Pt taking MVI with iron.       Past Medical History:  Diagnosis Date  . Clotting disorder ([place])    pt states she has a mutated gene that predisposes her to clotting  . HLD (hyperlipidemia)   . Hypothyroidism    possible h/o  . Liver hemangioma    per patient  . Osteopenia 2016, 2018   spine/hip; DEXA at [facility]; 2018-hip  . Osteoporosis 2018   spine; D[facility]A at [facility]  . 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 02/2010 Echo: EF 55-60%, no rwma.  . Vitamin D deficiency    low         Past Surgical History:  Procedure Laterality Date  . ABDOMINAL HYSTERECTOMY  1999   Total lap hyst BSO due to endometriosis  . CARDIAC ELECTROPHYSIOLOGY [place] AND ABLATION  2011  . COLONOSCOPY WITH PROPOFOL N/A 07/10/2015   Procedure: COLONOSCOPY WITH PROPOFOL;  Surgeon: Lollie Sails, MD;  Location[facility] ENDOSCOPY;  Service: Endoscopy;  Laterality: N/A;  . GALLBLADDER SURGERY    . HERNIA REPAIR  12/2010  . LEFT HEART CATH AND CORONARY ANGIOGRAPHY N/A 02/24/2017   Procedure: Left Heart Cath and Coronary Angiography;  Surgeon: Wellington Hampshire, MD;  Location: [place] CV LAB;  Service: Cardiovascular;  Laterality: N/A;  . TONSILLECTOMY           Family History  Problem Relation Age of Onset  . Heart disease Father        s/p CABG  . Obesity Father   . Hyperlipidemia Father   . Bladder Cancer Father   . Drug abuse Father   . Thyroid disease Sister   . Clotting disorder Sister        gene mutation  . Breast cancer Neg Hx     Social History        Socioeconomic History  . Marital status: Married    Spouse name: Not on file  . Number of children: 1  . Years of education: Not on file  . Highest education level: Not on file  Occupational History  . Occupation: Armed forces technical officer: [employer]  Social Needs  . Financial resource strain: Not on file  . Food insecurity    Worry: Not on file    Inability: Not on file  . Transportation needs    Medical: Not on file    Non-medical: Not on file  Tobacco Use  . Smoking status: Never Smoker  . Smokeless tobacco: Never Used  . Tobacco comment: tobacco use- no   Substance and Sexual Activity  . Alcohol use: Yes    Alcohol/week: 0.0 standard drinks    Comment: rare glass of wine.  . Drug use: No  . Sexual activity: Yes    Birth control/protection: Post-menopausal  Lifestyle  .  Physical activity    Days per week: Not on file    Minutes per session: Not on file  . Stress: Not on file  Relationships  . Social Herbalist on phone: Not on file    Gets together: Not on file    Attends religious service: Not on file    Active member of club or organization: Not on file    Attends meetings of clubs or organizations: Not on file    Relationship status: Not on file  . Intimate partner violence    Fear of current or ex partner: Not on file    Emotionally abused: Not on file    Physically abused: Not on file    Forced sexual activity: Not on file  Other Topics Concern  . Not on file  Social History Narrative   Lives locally with husband.  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          Current Outpatient Medications on File Prior to Visit  Medication Sig Dispense Refill  . Ascorbic Acid (VITAMIN C PO) Take by mouth.    . Calcium Carbonate 500 MG CHEW Chew 1 tablet by mouth daily.    . cholecalciferol (VITAMIN D) 1000 units tablet Take by mouth daily.    . Ferrous Sulfate (IRON PO) Take by mouth.    . Multiple Vitamins-Minerals (MULTIVITAMIN PO) Take 1 tablet by mouth daily.    . Thiamine HCl (VITAMIN B-1 PO) Take by mouth.     No current facility-administered medications on file prior to visit.       ROS:  Review of Systems  Constitutional: Negative for fatigue, fever and unexpected weight change.  Respiratory: Negative for cough, shortness of breath and wheezing.   Cardiovascular: Negative for chest pain, palpitations and leg swelling.  Gastrointestinal:  Negative for blood in stool, diarrhea, nausea and vomiting.  Endocrine: Negative for cold intolerance, heat intolerance and polyuria.  Genitourinary: Negative for dyspareunia, dysuria, flank pain, frequency, genital sores, hematuria, menstrual problem, pelvic pain, urgency, vaginal bleeding, vaginal discharge and vaginal pain.   Musculoskeletal: Negative for back pain, joint swelling and myalgias.  Skin: Negative for rash.  Neurological: Negative for dizziness, syncope, light-headedness, numbness and headaches.  Hematological: Negative for adenopathy.  Psychiatric/Behavioral: Negative for agitation, confusion, sleep disturbance and suicidal ideas. The patient is not nervous/anxious.      Objective: BP 100/76   Ht 5\' 7"  (1.702 m)   BMI 24.59 kg/m    Physical Exam Constitutional:      Appearance: She is well-developed.  Genitourinary:     Vulva, vagina, right adnexa and left adnexa normal.     Vaginal atrophic mucosa present.     No vaginal discharge,  erythema or tenderness.     Cervix is absent.     No right or left adnexal mass present.     Right adnexa not tender.     Left adnexa not tender.     Genitourinary Comments: UTERUS/CX SURG REM  Neck:     Musculoskeletal: Normal range of motion.     Thyroid: No thyromegaly.  Cardiovascular:     Rate and Rhythm: Normal rate and regular rhythm.     Heart sounds: Normal heart sounds. No murmur.  Pulmonary:     Effort: Pulmonary effort is normal.     Breath sounds: Normal breath sounds.  Chest:     Breasts:        Right: No mass, nipple discharge, skin change or tenderness.        Left: No mass, nipple discharge, skin change or tenderness.  Abdominal:     Palpations: Abdomen is soft.     Tenderness: There is no abdominal tenderness. There is no guarding.  Musculoskeletal: Normal range of motion.  Neurological:     General: No focal deficit present.     Mental Status: She is alert and oriented to person, place, and time.     Cranial Nerves: No cranial nerve deficit.  Skin:    General: Skin is warm and dry.  Psychiatric:        Mood and Affect: Mood normal.        Behavior: Behavior normal.        Thought Content: Thought content normal.        Judgment: Judgment normal.  Vitals signs reviewed.     Assessment/Plan:  Encounter for  annual routine gynecological examination  Cervical cancer screening - Plan: IGP, Aptima HPV  Encounter for screening mammogram for malignant neoplasm of breast - Plan: MM 3D SCREEN BREAST BILATERAL; pt current on mammo till 4/23  Postmenopausal atrophic vaginitis--tolerable currently. F/u prn.   Osteoporosis of lumbar spine - Plan: DG Bone Density; pt to sched 12/22   GYN counsel breast self exam, mammography screening, menopause, adequate intake of calcium and vitamin D, diet and exercise     F/U             Return in about 1 year (around 02/25/2020).  Etheridge Geil B. Ryann Pauli, PA-C 03/01/2019 10:36 AM

## 2021-01-25 ENCOUNTER — Encounter: Payer: Self-pay | Admitting: Obstetrics and Gynecology

## 2021-01-25 ENCOUNTER — Ambulatory Visit (INDEPENDENT_AMBULATORY_CARE_PROVIDER_SITE_OTHER): Payer: Managed Care, Other (non HMO) | Admitting: Obstetrics and Gynecology

## 2021-01-25 ENCOUNTER — Other Ambulatory Visit: Payer: Self-pay

## 2021-01-25 VITALS — BP 100/76 | Ht 67.0 in | Wt 160.0 lb

## 2021-01-25 DIAGNOSIS — Z01419 Encounter for gynecological examination (general) (routine) without abnormal findings: Secondary | ICD-10-CM | POA: Diagnosis not present

## 2021-01-25 DIAGNOSIS — Z1211 Encounter for screening for malignant neoplasm of colon: Secondary | ICD-10-CM | POA: Diagnosis not present

## 2021-01-25 DIAGNOSIS — Z124 Encounter for screening for malignant neoplasm of cervix: Secondary | ICD-10-CM

## 2021-01-25 DIAGNOSIS — Z1231 Encounter for screening mammogram for malignant neoplasm of breast: Secondary | ICD-10-CM

## 2021-01-25 DIAGNOSIS — M810 Age-related osteoporosis without current pathological fracture: Secondary | ICD-10-CM

## 2021-01-25 NOTE — Patient Instructions (Addendum)
I value your feedback and you entrusting us with your care. If you get a  patient survey, I would appreciate you taking the time to let us know about your experience today. Thank you! ? ? ?

## 2021-01-31 LAB — IGP, RFX APTIMA HPV ASCU

## 2021-02-19 ENCOUNTER — Telehealth: Payer: Self-pay

## 2021-02-19 NOTE — Telephone Encounter (Signed)
Pt calling; is having trouble seeing lab results from 01/25/21.  [phone] or if p 5pm [phone]  Pt has form to fill out proving she has had an annual physical and needed results; unable to retrieve them thru MyChart d/t computer updated and is unable to get in to [facility].  Lab values given.  Pt decided to come to office tomorrow pm and p/u results, have [facility] sign paper and fax it all together with HR.  Requested labs be printed and put up front for p/u. Labs printed from 01/12/21 and place in envelop and is up front for p/u.

## 2021-04-18 ENCOUNTER — Telehealth: Payer: Self-pay | Admitting: Family Medicine

## 2021-04-18 NOTE — Telephone Encounter (Signed)
She will need to be seen to determine appropriate treatment.  She should keep the virtual visit with Dr. Derrel Nip.  Please find out if she has had any severe eye pain, extreme light sensitivity, or extreme foreign body sensation in her eye or if she has had any vision changes with this.  If she has had any of those symptoms she needs to see an eye doctor pretty quickly.

## 2021-04-18 NOTE — Telephone Encounter (Signed)
Pt states that the pain is not severe and is only irritating. Pt states that there is minimal light sensitivity, outside seems "brighter than usual" and light stings slightly. Pt does not feel like there is something in her eye, just the yellow discharge on her eye and the eyelashes. Pt states her eye is itching and sore and she has been using OTC eye drops. Pt states there is no vision changes in her eye.

## 2021-04-18 NOTE — Telephone Encounter (Signed)
Transferred to Access Nurse   Pt is having congestion, cough, sinus pain and thinks she has pink eye (eye is crusty and draining)  Pt declined VV   Access nurse aware no available appts

## 2021-04-18 NOTE — Telephone Encounter (Signed)
Called patient and discussed her eye. Michelle Armstrong states that she has possible pink eye from her grandson. Pt c/o itching, discharge, redness x2-3days. Pt states that her eye feels sticky. Pt declines going to the urgent care due to possibly becoming sick. Pt refused due to outside cleanliness and people not wearing masks.   Pt asks if there is any way she can have medicine sent in without being seen.   Pt scheduled a Video Visit on 04/19/21 with Dr. Derrel Nip

## 2021-04-18 NOTE — Telephone Encounter (Signed)
Pt called in to access nurse due to pink eye. Pt took a home test for covid and it was negative. Pt was instructed to go to urgent care for her symptoms, pt declined. Stating that she does not want to encounter covid or monkey pox. Pt wants to be seen at our office.

## 2021-04-18 NOTE — Telephone Encounter (Signed)
Called to speak with [facility]. Informed her that Dr. Derrel Nip is ok for the visit on 04/19/21 to be in person. Cathy verbalized understanding and had no further questions.

## 2021-04-19 ENCOUNTER — Encounter: Payer: Self-pay | Admitting: Internal Medicine

## 2021-04-19 ENCOUNTER — Ambulatory Visit: Payer: Managed Care, Other (non HMO) | Admitting: Internal Medicine

## 2021-04-19 ENCOUNTER — Telehealth: Payer: Self-pay | Admitting: Family Medicine

## 2021-04-19 ENCOUNTER — Other Ambulatory Visit: Payer: Self-pay

## 2021-04-19 VITALS — BP 100/68 | HR 86 | Temp 98.1°F | Ht 67.0 in | Wt 162.0 lb

## 2021-04-19 DIAGNOSIS — J22 Unspecified acute lower respiratory infection: Secondary | ICD-10-CM | POA: Diagnosis not present

## 2021-04-19 MED ORDER — HYDROCOD POLST-CPM POLST ER 10-8 MG/5ML PO SUER: 5.0000 mL | Freq: Every evening | ORAL | 0 refills | Status: DC | PRN

## 2021-04-19 MED ORDER — METHYLPREDNISOLONE ACETATE 40 MG/ML IJ SUSP
40.0000 mg | Freq: Once | INTRAMUSCULAR | Status: AC
  Administered 2021-04-19: 40 mg via INTRAMUSCULAR

## 2021-04-19 MED ORDER — HYDROCOD POLST-CPM POLST ER 10-8 MG/5ML PO SUER: 5.0000 mL | Freq: Two times a day (BID) | ORAL | 0 refills | Status: DC | PRN

## 2021-04-19 MED ORDER — LEVOFLOXACIN 500 MG PO TABS: 500.0000 mg | ORAL_TABLET | Freq: Every day | ORAL | 0 refills | Status: AC

## 2021-04-19 NOTE — Progress Notes (Signed)
Subjective:  Patient ID: Michelle Armstrong, female    DOB: [birth year]  Age: 60 y.o. MRN: [id number]  CC: The encounter diagnosis was Lower resp. tract infection.  HPI Michelle Armstrong presents for evaluation of signs and symptoms of viral infection  This visit occurred during the SARS-CoV-2 public health emergency.  Safety protocols were in place, including screening questions prior to the visit, additional usage of staff PPE, and extensive cleaning of exam room while observing appropriate contact time as indicated for disinfecting solutions.     Stared taking clindamycin for purulent cough,  sore throat.  Started feeling better,  then last weekend had contact with other grandchild who had pinkeye,  and  sympotms started again with bilateral scleral irritation,  crusting  of  eyelids, persistent cough productive of purulent sputum, and  ear pain .  Has checked herslef daily for 7 days for COVId,  all tests negative.   Outpatient Medications Prior to Visit  Medication Sig Dispense Refill   Acetaminophen (TYLENOL PO) Take by mouth.     Ascorbic Acid (VITAMIN C PO) Take by mouth.     Calcium Carbonate 500 MG CHEW Chew 1 tablet by mouth daily.     cholecalciferol (VITAMIN D) 1000 units tablet Take by mouth daily.     Ferrous Sulfate (IRON PO) Take by mouth.     Multiple Vitamins-Minerals (MULTIVITAMIN PO) Take 1 tablet by mouth daily.     Thiamine HCl (VITAMIN B-1 PO) Take by mouth.     No facility-administered medications prior to visit.    Review of Systems;  Patient denies headache, fevers,  unintentional weight loss, skin rash, eye pain,  sinus pain,  dysphagia,  hemoptysis , , dyspnea, wheezing, chest pain, palpitations, orthopnea, edema, abdominal pain, nausea, melena, diarrhea, constipation, flank pain, dysuria, hematuria, urinary  Frequency, nocturia, numbness, tingling, seizures,  Focal weakness, Loss of consciousness,  Tremor, insomnia, depression, anxiety, and suicidal  ideation.      Objective:  BP 100/68 (BP Location: Left Arm, Patient Position: Sitting, Cuff Size: Large)   Pulse 86   Temp 98.1 F (36.7 C) (Oral)   Ht '5\' 7"'$  (1.702 m)   Wt 162 lb (73.5 kg)   SpO2 99%   BMI 25.37 kg/m   BP Readings from Last 3 Encounters:  04/19/21 100/68  01/25/21 100/76  06/02/20 120/70    Wt Readings from Last 3 Encounters:  04/19/21 162 lb (73.5 kg)  01/25/21 160 lb (72.6 kg)  09/14/20 157 lb (71.2 kg)    General appearance: alert, cooperative and appears stated age Ears: normal TM's and external ear canals both ears Throat: lips, mucosa, and tongue normal; teeth and gums normal Neck: no adenopathy, no carotid bruit, supple, symmetrical, trachea midline and thyroid not enlarged, symmetric, no tenderness/mass/nodules Back: symmetric, no curvature. ROM normal. No CVA tenderness. Lungs: clear to auscultation bilaterally Heart: regular rate and rhythm, S1, S2 normal, no murmur, click, rub or gallop Abdomen: soft, non-tender; bowel sounds normal; no masses,  no organomegaly Pulses: 2+ and symmetric Skin: Skin color, texture, turgor normal. No rashes or lesions Lymph nodes: Cervical, supraclavicular, and axillary nodes normal.  Lab Results  Component Value Date   HGBA1C 5.6 01/12/2021   HGBA1C 5.4 05/31/2020   HGBA1C 5.3 01/07/2020    Lab Results  Component Value Date   CREATININE 0.75 01/12/2021   CREATININE 0.79 05/31/2020   CREATININE 0.74 04/18/2020    Lab Results  Component Value Date   WBC 4.0 01/12/2021  HGB 13.8 01/12/2021   HCT 41.4 01/12/2021   PLT 268 01/12/2021   GLUCOSE 85 01/12/2021   CHOL 250 (H) 01/12/2021   TRIG 54 01/12/2021   HDL 120 01/12/2021   LDLCALC 121 (H) 01/12/2021   ALT 30 01/12/2021   AST 33 01/12/2021   NA 140 01/12/2021   K 4.2 01/12/2021   CL 100 01/12/2021   CREATININE 0.75 01/12/2021   BUN 19 01/12/2021   CO2 25 01/12/2021   TSH 2.290 01/12/2021   INR 1.10 02/24/2017   HGBA1C 5.6 01/12/2021     MM 3D SCREEN BREAST BILATERAL  Result Date: 12/18/2020 CLINICAL DATA:  Screening. EXAM: DIGITAL SCREENING BILATERAL MAMMOGRAM WITH TOMOSYNTHESIS AND CAD TECHNIQUE: Bilateral screening digital craniocaudal and mediolateral oblique mammograms were obtained. Bilateral screening digital breast tomosynthesis was performed. The images were evaluated with computer-aided detection. COMPARISON:  Previous exam(s). ACR Breast Density Category b: There are scattered areas of fibroglandular density. FINDINGS: There are no findings suspicious for malignancy. The images were evaluated with computer-aided detection. IMPRESSION: No mammographic evidence of malignancy. A result letter of this screening mammogram will be mailed directly to the patient. RECOMMENDATION: Screening mammogram in one year. (Code:SM-B-01Y) BI-RADS CATEGORY  1: Negative. Electronically Signed   By: Lillia Mountain M.D.   On: 12/18/2020 09:48    Assessment & Plan:   Problem List Items Addressed This Visit       Unprioritized   Lower resp. tract infection - Primary    Flu,  COVID PCR and RSV tests were negative.  Empiric treatment with levaquin , prednisone and tussionex changed to azithromycin and tessalon per patient preference       Relevant Orders   COVID-19, Flu A+B and RSV (Completed)    I am having Michelle "Cathy" start on levofloxacin. I am also having her maintain her Calcium Carbonate, Multiple Vitamins-Minerals (MULTIVITAMIN PO), cholecalciferol, Ferrous Sulfate (IRON PO), Ascorbic Acid (VITAMIN C PO), Thiamine HCl (VITAMIN B-1 PO), and Acetaminophen (TYLENOL PO). We administered methylPREDNISolone acetate.  Meds ordered this encounter  Medications   levofloxacin (LEVAQUIN) 500 MG tablet    Sig: Take 1 tablet (500 mg total) by mouth daily for 7 days.    Dispense:  7 tablet    Refill:  0   DISCONTD: chlorpheniramine-HYDROcodone (TUSSIONEX PENNKINETIC ER) 10-8 MG/5ML SUER    Sig: Take 5 mLs by mouth at  bedtime as needed.    Dispense:  140 mL    Refill:  0   methylPREDNISolone acetate (DEPO-MEDROL) injection 40 mg    There are no discontinued medications.  Follow-up: No follow-ups on file.   Crecencio Mc, MD

## 2021-04-19 NOTE — Addendum Note (Signed)
Addended by: Crecencio Mc on: 04/19/2021 05:05 PM   Modules accepted: Orders

## 2021-04-19 NOTE — Telephone Encounter (Signed)
Good grief!  Tell her to use what they gave her twice daily  and I have sent a second prescription for 100 ml  (10 day supply) to [facility] for next refill

## 2021-04-19 NOTE — Telephone Encounter (Signed)
Are you willing to prescribe muscle relaxer for patient?

## 2021-04-19 NOTE — Telephone Encounter (Signed)
Pt saw Dr. Derrel Nip today an wanted to know if a muscle relaxer could be called in since she is having back pain from coughing

## 2021-04-19 NOTE — Telephone Encounter (Signed)
IF SHE USES THE COUGH MEDICINE I PRESCRIBE SHE WILL NOT NEED A MUSCLE RELAXER

## 2021-04-19 NOTE — Telephone Encounter (Signed)
Pt stated that she only received a very small bottle of the Tussionex to be taken at bedtime only. She said they are not allowed to dispense the larger bottles. She feels it will not be enough.   I called [place] & patient was only able to get 35 mL bc of the directions for her to take 1 mL at night. The limit on this medication is 7 day script. The 140 mL would be 28 days. Directions would have to be changed to twice a day to receive more.

## 2021-04-19 NOTE — Patient Instructions (Signed)
I am treating you for sinusitis/otitis /pneumonia  pending the results of your viral tests for RSV,  flu and COVID 19    I am prescribing an antibiotic (levaquin) and cough suppressant with a narcotic t to manage your cough and back pain .   You will SLEEP with this medication so do not plan to drive anywhere .  You can use Robitussin during the day if the Tussionex is too strong.   You received a shot of Dexamethasone today (prednisone) to help calm down all the inflammation.    I also advise use of the following OTC meds to help with your other symptoms.   Take   Sudafed PE  10 to 30 mg every 8 hours for the  ear and sinus pain , you may substitute Afrin nasal spray for the nighttime dose of sudafed PE  If needed to prevent insomnia.  Gargle with salt water as needed for sore throat.   Taking an antibiotic can create an imbalance in the normal population of bacteria that live in the small intestine.  This imbalance can persist for 3 months.   NOTE:  Taking a probiotic ( Align, Floraque or Culturelle), the generic version of one of these over the counter medications, or an alternative form (kombucha,  Yogurt, or another dietary source) for a minimum of 3 weeks may help prevent a serious antibiotic associated diarrhea  Called clostridium dificile colitis that occurs when the bacteria population is altered .  Taking a probiotic may also prevent vaginitis due to yeast infections and can be continued indefinitely if you feel that it improves your digestion or your elimination (bowels).

## 2021-04-20 ENCOUNTER — Telehealth: Payer: Self-pay | Admitting: Family Medicine

## 2021-04-20 MED ORDER — AZITHROMYCIN 500 MG PO TABS: 500.0000 mg | ORAL_TABLET | Freq: Every day | ORAL | 0 refills | Status: DC

## 2021-04-20 MED ORDER — BENZONATATE 200 MG PO CAPS: 200.0000 mg | ORAL_CAPSULE | Freq: Three times a day (TID) | ORAL | 1 refills | Status: DC | PRN

## 2021-04-20 MED ORDER — TIZANIDINE HCL 4 MG PO TABS: 4.0000 mg | ORAL_TABLET | Freq: Four times a day (QID) | ORAL | 0 refills | Status: DC | PRN

## 2021-04-20 NOTE — Telephone Encounter (Signed)
Spoken to patient, she is stating that when she took her cough syrup it makes her cough a lot worse. The coughing has caused her to hurt her back and it is causing stabbing pains in certain areas. She would like to try tessalon pearls added and something for pain like a cream? Muscle relaxer and tylenol for pain. She can not take NSAIDS due to ulcers.. Please advise.

## 2021-04-20 NOTE — Telephone Encounter (Signed)
Please call her pharmacy and cancel the 2nd prescription for Tussionex   I will call in tessalon perles and a muscle relaxer,  and she can try salon pas with lidocaine for theback pain .  Regards,   Deborra Medina, MD

## 2021-04-20 NOTE — Addendum Note (Signed)
Addended by: Crecencio Mc on: 04/20/2021 02:09 PM   Modules accepted: Orders

## 2021-04-20 NOTE — Telephone Encounter (Signed)
Spoken to patient and she feels her abx Levaquin is giving her side effects of heel pain, leg pain and would like to take something not as strong such as a zpak because she knows that doesn't cause side effects.

## 2021-04-20 NOTE — Telephone Encounter (Signed)
Patient was informed.

## 2021-04-20 NOTE — Telephone Encounter (Signed)
Patient has been informed.

## 2021-04-20 NOTE — Telephone Encounter (Signed)
Pt called an wanted to discuss the chlorpheniramine-HYDROcodone [facility] ER) 10-8  she said that it is causing her to cough more also her back pain has gotten worse now

## 2021-04-20 NOTE — Addendum Note (Signed)
Addended by: Crecencio Mc on: 04/20/2021 12:24 PM   Modules accepted: Orders

## 2021-04-20 NOTE — Telephone Encounter (Signed)
Zpack sent.  

## 2021-04-21 LAB — COVID-19, FLU A+B AND RSV
Influenza A, NAA: NOT DETECTED
Influenza B, NAA: NOT DETECTED
RSV, NAA: NOT DETECTED
SARS-CoV-2, NAA: NOT DETECTED

## 2021-04-22 ENCOUNTER — Encounter: Payer: Self-pay | Admitting: Internal Medicine

## 2021-04-22 NOTE — Assessment & Plan Note (Signed)
Flu,  COVID PCR and RSV tests were negative.  Empiric treatment with levaquin , prednisone and tussionex changed to azithromycin and tessalon per patient preference

## 2021-04-27 NOTE — Telephone Encounter (Signed)
error 

## 2021-06-05 ENCOUNTER — Telehealth: Payer: Self-pay | Admitting: Family Medicine

## 2021-06-05 NOTE — Telephone Encounter (Signed)
Patient had a back injury from falling 05/17/21 and has been to the hospital and seen a chiropractor. It has been over a month since her last visit. They informed the patient that she needs a follow up MRI on her neck and spine to determine need for further treatment.    Please advise  Michelle Armstrong,cma

## 2021-06-05 NOTE — Telephone Encounter (Signed)
She needs a visit for this.  It looks like she has been seeing orthopedics for a fracture in her left leg.  Has she discussed the need for these MRIs with them?  They may be able to arrange this as they have been following her more recently I have.  If needed we can try to get her in for a visit though we will have to look for a time to get her scheduled.

## 2021-06-05 NOTE — Telephone Encounter (Signed)
Patient had a back injury from falling 05/17/21 and has been to the hospital and seen a chiropractor. It has been over a month since her last visit. They informed the patient that she needs a follow up MRI on her neck and spine to determine need for further treatment.   Please advise

## 2021-06-06 NOTE — Telephone Encounter (Signed)
Patient states that her mobility is severely restricted and she is unable to come in. She does not have a wheelchair yet. States she has to scoot along the floor with her husband holding her legs and then he has to lift her into the car. States this process is painful.    She wants to know if this can be a virtual visit? An MRI of the neck and spine was suggested. States she tried to get the order from [facility] but they did not do this. Carma Dwiggins,cma

## 2021-06-06 NOTE — Telephone Encounter (Signed)
A virtual visit would be ok.

## 2021-06-06 NOTE — Telephone Encounter (Signed)
Patient states that her mobility is severely restricted and she is unable to come in. She does not have a wheelchair yet. States she has to scoot along the floor with her husband holding her legs and then he has to lift her into the car. States this process is painful.   She wants to know if this can be a virtual visit? An MRI of the neck and spine was suggested. States she tried to get the order from [facility] but they did not do this.

## 2021-06-08 ENCOUNTER — Ambulatory Visit (INDEPENDENT_AMBULATORY_CARE_PROVIDER_SITE_OTHER): Payer: Managed Care, Other (non HMO) | Admitting: Family Medicine

## 2021-06-08 ENCOUNTER — Encounter: Payer: Self-pay | Admitting: Family Medicine

## 2021-06-08 ENCOUNTER — Other Ambulatory Visit: Payer: Self-pay

## 2021-06-08 ENCOUNTER — Telehealth: Payer: Self-pay | Admitting: *Deleted

## 2021-06-08 DIAGNOSIS — M5412 Radiculopathy, cervical region: Secondary | ICD-10-CM

## 2021-06-08 DIAGNOSIS — S82832A Other fracture of upper and lower end of left fibula, initial encounter for closed fracture: Secondary | ICD-10-CM | POA: Diagnosis not present

## 2021-06-08 DIAGNOSIS — S32010A Wedge compression fracture of first lumbar vertebra, initial encounter for closed fracture: Secondary | ICD-10-CM

## 2021-06-08 NOTE — Telephone Encounter (Signed)
Please place X-ray order for appt on Monday. Thanks

## 2021-06-08 NOTE — Progress Notes (Signed)
Virtual Visit via VIDEO Note  This visit type was conducted due to national recommendations for restrictions regarding the COVID-19 pandemic (e.g. social distancing).  This format is felt to be most appropriate for this patient at this time.  All issues noted in this document were discussed and addressed.  No physical exam was performed (except for noted visual exam findings with Video Visits).   I connected with Michelle Armstrong today at  3:15 PM EDT by a video enabled telemedicine application and verified that I am speaking with the correct person using two identifiers. Location patient: home Location provider: work Persons participating in the virtual visit: patient, provider  I discussed the limitations, risks, security and privacy concerns of performing an evaluation and management service by telephone and the availability of in person appointments. I also discussed with the patient that there may be a patient responsible charge related to this service. The patient expressed understanding and agreed to proceed.  Reason for visit: same day visit  HPI: Back injury/fall: Patient notes on 05/17/2021 she had follow-up.  Her foot went into a divot in some outdoor carpet and she ended up twisting both ankles.  Notes her right foot still hurts though her ankle is okay.  She heard a crack when this occurred.  She was evaluated at a hospital in [place] and found to have a proximal fibula fracture as well as a lateral malleolus fracture.  She was placed in a cast.  She has been seen by orthopedics in [place] for this.  She notes they would only address her fibula fractures and wanted to refer her for her other issues.  She was also found to have an L1 compression fracture.  She had no prior back issues and notes her chiropractor noted no prior issues before her trip.  Does note some discomfort in her back that is radiating to her left thigh and some pain radiating to her right leg.  Also has some pain  radiating down her left arm.  No numbness.  She reports some knots above her left knee anteriorly.  She notes no leg numbness.  No incontinence.  No saddle anesthesia.  She had no loss of consciousness with the fall.  She does not remember head injury.  She does report some fingertip numbness at times.  She is in the process of getting a wheelchair and it has been ordered though she has not received this yet.  The patient reports being unhappy with the orthopedic office she was evaluated at initially.  She does report she tried to call [facility] though they advised that maybe she needed to see the prior orthopedist until her cast is off.  She notes the orthopedist in [place] just seemed to want to do surgery.  The patient has been taking Tylenol 6 tablets/day and 1 oxycodone per day.   ROS: See pertinent positives and negatives per HPI.  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 02/2010 Echo: EF 55-60%, no rwma.  Vitamin D deficiency    low    Past Surgical History:  Procedure Laterality Date   ABDOMINAL HYSTERECTOMY  1999   Total lap hyst BSO due to endometriosis   CARDIAC ELECTROPHYSIOLOGY MAPPING AND ABLATION  2011   COLONOSCOPY WITH PROPOFOL N/A 07/10/2015   Procedure: COLONOSCOPY WITH PROPOFOL;  Surgeon: Lollie Sails, MD;  Location: [facility] ENDOSCOPY;  Service: Endoscopy;  Laterality: N/A;   GALLBLADDER SURGERY     HERNIA REPAIR  12/2010   LEFT HEART CATH AND CORONARY ANGIOGRAPHY N/A 02/24/2017   Procedure: Left Heart Cath and Coronary Angiography;  Surgeon: Wellington Hampshire, MD;  Location:  [facility];  Service: Cardiovascular;  Laterality: N/A;   TONSILLECTOMY      Family History  Problem Relation Age of Onset   Heart disease Father        s/p CABG   Obesity Father    Hyperlipidemia Father    Bladder Cancer Father    Drug abuse Father    Thyroid disease Sister    Clotting disorder Sister        gene mutation   Breast cancer Neg Hx     SOCIAL HX: Non-smoker   Current Outpatient Medications:    Acetaminophen (TYLENOL PO), Take by mouth., Disp: , Rfl:    Ascorbic Acid (VITAMIN C PO), Take by mouth., Disp: , Rfl:    aspirin EC 81 MG tablet, Take 81 mg by mouth daily. Swallow whole., Disp: , Rfl:    Calcium Carbonate 500 MG CHEW, Chew 1 tablet by mouth daily., Disp: , Rfl:    cholecalciferol (VITAMIN D) 1000 units tablet, Take by mouth daily., Disp: , Rfl:    Ferrous Sulfate (IRON PO), Take by mouth., Disp: , Rfl:    Multiple Vitamins-Minerals (MULTIVITAMIN PO), Take 1 tablet by mouth daily., Disp: , Rfl:    oxycodone-acetaminophen (LYNOX) 5-300 MG tablet, Take 1 tablet by mouth every 4 (four) hours as needed for pain., Disp: , Rfl:    Thiamine HCl (VITAMIN B-1 PO), Take by mouth., Disp: , Rfl:    azithromycin (ZITHROMAX) 500 MG tablet, Take 1 tablet (500 mg total) by mouth daily. (Patient not taking: Reported on 06/08/2021), Disp: 7 tablet, Rfl: 0   benzonatate (TESSALON) 200 MG capsule, Take 1 capsule (200 mg total) by mouth 3 (three) times daily as needed for cough. (Patient not taking: Reported on 06/08/2021), Disp: 60 capsule, Rfl: 1   tiZANidine (ZANAFLEX) 4 MG tablet, Take 1 tablet (4 mg total) by mouth every 6 (six) hours as needed for muscle spasms. (Patient not taking: Reported on 06/08/2021), Disp: 60 tablet, Rfl: 0  EXAM:  VITALS per patient if applicable:  GENERAL: alert, oriented, appears well and in no acute distress  HEENT: atraumatic, conjunttiva clear, no obvious abnormalities on inspection of external nose and ears  NECK: normal movements  of the head and neck  LUNGS: on inspection no signs of respiratory distress, breathing rate appears normal, no obvious gross SOB, gasping or wheezing  CV: no obvious cyanosis  MS: moves arms without noticeable abnormality, left leg is in a brace/cast  PSYCH/NEURO: pleasant and cooperative, no obvious depression or anxiety, speech and thought processing grossly intact  ASSESSMENT AND PLAN:  Discussed the following assessment and plan:  Problem List Items Addressed This Visit     Cervical radiculopathy - Primary    The patient has pain radiating down her left arm.  Some tingling in her fingertips.  We will plan on an  x-ray to start and then consider an MRI.      Relevant Orders   DG Cervical Spine Complete   Closed compression fracture of body of L1 vertebra (HCC)    Plan for MRI to evaluate for how acute this is though it would seem to be acute based on her history.  She also has some pain radiating into her legs which could indicate nerve impingement.  MRI will help evaluate whether or not that is related to the compression fracture.      Relevant Orders   MR Lumbar Spine Wo Contrast   Fibula fracture    Discussed seeing a different orthopedist for a second opinion.  Referral will be placed.  Discussed that the nodules above her knee could be reactive or related to a hematoma or lymph nodes.  She is coming in for a cervical spine x-ray and I will try to evaluate the nodules when she is physically in the office.      Relevant Orders   Ambulatory referral to Orthopedic Surgery    Return in about 4 weeks (around 07/06/2021).   I discussed the assessment and treatment plan with the patient. The patient was provided an opportunity to ask questions and all were answered. The patient agreed with the plan and demonstrated an understanding of the instructions.   The patient was advised to call back or seek an in-person evaluation if the symptoms worsen or if the condition fails to  improve as anticipated.   I have spent 40 minutes in the care of this patient regarding history taking, documentation, review of the ED visit note and recent orthopedic notes, discussion of plan, placing orders.    Tommi Rumps, MD

## 2021-06-09 NOTE — Telephone Encounter (Signed)
Order placed

## 2021-06-11 ENCOUNTER — Ambulatory Visit (INDEPENDENT_AMBULATORY_CARE_PROVIDER_SITE_OTHER): Payer: Managed Care, Other (non HMO)

## 2021-06-11 ENCOUNTER — Other Ambulatory Visit: Payer: Managed Care, Other (non HMO)

## 2021-06-11 ENCOUNTER — Other Ambulatory Visit: Payer: Self-pay

## 2021-06-11 DIAGNOSIS — M5412 Radiculopathy, cervical region: Secondary | ICD-10-CM | POA: Insufficient documentation

## 2021-06-11 DIAGNOSIS — S82409A Unspecified fracture of shaft of unspecified fibula, initial encounter for closed fracture: Secondary | ICD-10-CM | POA: Insufficient documentation

## 2021-06-11 DIAGNOSIS — S32010A Wedge compression fracture of first lumbar vertebra, initial encounter for closed fracture: Secondary | ICD-10-CM | POA: Insufficient documentation

## 2021-06-11 NOTE — Assessment & Plan Note (Addendum)
Discussed seeing a different orthopedist for a second opinion.  Referral will be placed.  Discussed that the nodules above her knee could be reactive or related to a hematoma or lymph nodes.  She is coming in for a cervical spine x-ray and I will try to evaluate the nodules when she is physically in the office.

## 2021-06-11 NOTE — Progress Notes (Addendum)
Addendum: Patient came to the office on 06/11/2021 for a neck x-ray.  I was able to palpate the nodules in her distal quadriceps muscle.  These are slightly tender.  They seem to be within the musculature.  There is no overlying skin change.  These findings could be related to her trauma.  She is able to fully extend her leg.  I discussed that the patient really probably needs to have somebody ultrasound the area within an orthopedic office.  A referral to the [facility] has already been placed.

## 2021-06-11 NOTE — Assessment & Plan Note (Signed)
Plan for MRI to evaluate for how acute this is though it would seem to be acute based on her history.  She also has some pain radiating into her legs which could indicate nerve impingement.  MRI will help evaluate whether or not that is related to the compression fracture.

## 2021-06-11 NOTE — Assessment & Plan Note (Signed)
The patient has pain radiating down her left arm.  Some tingling in her fingertips.  We will plan on an x-ray to start and then consider an MRI.

## 2021-06-14 ENCOUNTER — Other Ambulatory Visit: Payer: Self-pay | Admitting: Family Medicine

## 2021-06-14 ENCOUNTER — Telehealth: Payer: Self-pay | Admitting: Family Medicine

## 2021-06-14 DIAGNOSIS — M5412 Radiculopathy, cervical region: Secondary | ICD-10-CM

## 2021-06-14 NOTE — Telephone Encounter (Signed)
I am not exactly sure what they would be ultrasounding below the knee for so would be difficult to place an order for this.  She would have to be sent out for [place] to do an ultrasound as well.  It would be best if the orthopedic office could arrange for this as they will be the ones following up on it and determining appropriate treatment.

## 2021-06-14 NOTE — Telephone Encounter (Signed)
Patient was referred to [facility] for left knee pain. They wanted to do an Ultrasound above and below the knee but informed the Patient they do not do these there. They would have to send her out somewhere.    Patient wanting to know if left knee ultrasound could be ordered by PCP?    Please advise  Michelle Armstrong,cma

## 2021-06-14 NOTE — Telephone Encounter (Signed)
Patient was referred to [facility] for left knee pain. They wanted to do an Ultrasound above and below the knee but informed the Patient they do not do these there. They would have to send her out somewhere.   Patient wanting to know if left knee ultrasound could be ordered by PCP?   Please advise

## 2021-06-15 ENCOUNTER — Telehealth: Payer: Self-pay

## 2021-06-15 DIAGNOSIS — M25562 Pain in left knee: Secondary | ICD-10-CM

## 2021-06-15 DIAGNOSIS — R229 Localized swelling, mass and lump, unspecified: Secondary | ICD-10-CM

## 2021-06-15 DIAGNOSIS — M25462 Effusion, left knee: Secondary | ICD-10-CM

## 2021-06-15 NOTE — Telephone Encounter (Signed)
FYI Michelle Armstrong  [name], how soon can stat ultrasound of left leg be scheduled?   [name]  Call pt  I have not seen patient but from note it sounds she has knot and swelling above and below the knee. Is she looking for blood clot? OR are we looking for effusion ( swelling) of knee joint?   I do not see Xray of left knee and have ordered as well. [facility]   I can see Xray of left femur, lumbar spine, ankle this month. Appears she had distal left fibular fracture. Compression fracture seen L1.   Please confirm she is continuing to follow with orthopedics.  Please confirm no fever, redness, red streaks from knee, sob.   If any of the above or pain, swelling worsens over weekend, she would need to go to ED.

## 2021-06-15 NOTE — Telephone Encounter (Addendum)
Noted. Please different order that was placed.

## 2021-06-15 NOTE — Telephone Encounter (Signed)
Thanks dr Caryl Bis Noted  Michelle Armstrong, May disregard order for left knee Xray and [place] left leg to eval for DVT  [name], may cancel the above

## 2021-06-15 NOTE — Telephone Encounter (Signed)
Thank you for taking care of this. This was not to look for a blood clot. She had some areas that felt like nodules just superior to the patella. The idea was for orthopedics to evaluate this as she might need an [place] to determine the cause of the exam findings and my thought was that they might have been able to do this in their office. We will see what her US reveals.

## 2021-06-15 NOTE — Telephone Encounter (Signed)
She has a cast on. They will not be able to do an [place] of her left leg to evaluate for a DVT. That was not my concern. The [place] was to evaluate the area just above her knee for the nodules just above her knee. I will place the order for this since orthopedics apparently can't evaluate this issue themselves.

## 2021-06-17 ENCOUNTER — Ambulatory Visit
Admission: RE | Admit: 2021-06-17 | Discharge: 2021-06-17 | Disposition: A | Discharge status: Home / Self Care | Payer: Managed Care, Other (non HMO) | Source: Ambulatory Visit | Attending: Family Medicine | Admitting: Family Medicine

## 2021-06-17 ENCOUNTER — Other Ambulatory Visit: Payer: Self-pay

## 2021-06-17 ENCOUNTER — Ambulatory Visit
Admission: RE | Admit: 2021-06-17 | Discharge: 2021-06-17 | Disposition: A | Discharge status: Home / Self Care | Payer: Managed Care, Other (non HMO) | Source: Ambulatory Visit | Attending: Family | Admitting: Family

## 2021-06-17 DIAGNOSIS — R229 Localized swelling, mass and lump, unspecified: Secondary | ICD-10-CM | POA: Insufficient documentation

## 2021-06-17 DIAGNOSIS — M5412 Radiculopathy, cervical region: Secondary | ICD-10-CM | POA: Diagnosis not present

## 2021-06-17 DIAGNOSIS — M25562 Pain in left knee: Secondary | ICD-10-CM | POA: Diagnosis present

## 2021-06-17 DIAGNOSIS — M25462 Effusion, left knee: Secondary | ICD-10-CM

## 2021-06-17 DIAGNOSIS — S32010A Wedge compression fracture of first lumbar vertebra, initial encounter for closed fracture: Secondary | ICD-10-CM | POA: Diagnosis present

## 2021-06-18 ENCOUNTER — Ambulatory Visit: Payer: Managed Care, Other (non HMO)

## 2021-06-25 ENCOUNTER — Telehealth: Payer: Self-pay | Admitting: Family Medicine

## 2021-06-25 MED ORDER — NITROFURANTOIN MONOHYD MACRO 100 MG PO CAPS: 100.0000 mg | ORAL_CAPSULE | Freq: Two times a day (BID) | ORAL | 0 refills | Status: DC

## 2021-06-25 NOTE — Telephone Encounter (Signed)
Pt notified of medicine being sent into pharmacy.

## 2021-06-25 NOTE — Telephone Encounter (Signed)
Patient calling in and states onset of last Friday she has been having urinary symptoms. Patient is having burning with urination, odor to the urine, ongoing back pain from the fall so unsure if related.    Patient states due to her leg injuries she has not been able to bathe, just uses a cloth to clean herself. Patient states getting out of the house is too painful and hard. She does not have a wheelchair to get around in.    Patient declines an appointment to have a urine sample ran. States she just can not do it.    Would like something sent in or a call back with suggested over the counter treatments.   Amauri Keefe,cma

## 2021-06-25 NOTE — Telephone Encounter (Signed)
Noted.  Given her extreme difficulty in getting out of the house I will send in [facility] for her to start on for a UTI.  If this does not help with her symptoms she will have to provide [place] with a urine sample.  If her symptoms worsen she needs to be seen in person.

## 2021-06-25 NOTE — Addendum Note (Signed)
Addended by: Leone Haven on: 06/25/2021 10:19 AM   Modules accepted: Orders

## 2021-06-25 NOTE — Telephone Encounter (Signed)
Patient calling in and states onset of last Friday she has been having urinary symptoms. Patient is having burning with urination, odor to the urine, ongoing back pain from the fall so unsure if related.   Patient states due to her leg injuries she has not been able to bathe, just uses a cloth to clean herself. Patient states getting out of the house is too painful and hard. She does not have a wheelchair to get around in.   Patient declines an appointment to have a urine sample ran. States she just can not do it.   Would like something sent in or a call back with suggested over the counter treatments.

## 2021-07-03 ENCOUNTER — Ambulatory Visit: Payer: Managed Care, Other (non HMO)

## 2021-07-03 ENCOUNTER — Other Ambulatory Visit: Payer: Self-pay | Admitting: Family Medicine

## 2021-07-03 MED ORDER — TRAMADOL HCL 50 MG PO TABS: 50.0000 mg | ORAL_TABLET | Freq: Three times a day (TID) | ORAL | 0 refills | Status: DC | PRN

## 2021-07-04 ENCOUNTER — Encounter: Payer: Self-pay | Admitting: Physical Therapy

## 2021-07-04 ENCOUNTER — Other Ambulatory Visit: Payer: Self-pay

## 2021-07-04 ENCOUNTER — Telehealth: Payer: Self-pay | Admitting: Family Medicine

## 2021-07-04 ENCOUNTER — Ambulatory Visit: Payer: Managed Care, Other (non HMO) | Attending: Physician Assistant | Admitting: Physical Therapy

## 2021-07-04 DIAGNOSIS — R2689 Other abnormalities of gait and mobility: Secondary | ICD-10-CM | POA: Insufficient documentation

## 2021-07-04 DIAGNOSIS — M79662 Pain in left lower leg: Secondary | ICD-10-CM | POA: Insufficient documentation

## 2021-07-04 DIAGNOSIS — R2681 Unsteadiness on feet: Secondary | ICD-10-CM | POA: Insufficient documentation

## 2021-07-04 DIAGNOSIS — M6281 Muscle weakness (generalized): Secondary | ICD-10-CM | POA: Insufficient documentation

## 2021-07-04 NOTE — Therapy (Signed)
[place] [facility] SERVICES [address] [place], [place], [address] Phone: [phone]   Fax:  [phone]  Physical Therapy Note  Patient Details  Name: Michelle Armstrong MRN: [id number] Date of Birth: Jan 18, 1961 No data recorded  Encounter Date: 07/04/2021   PT End of Session - 07/04/21 1431     Visit Number 1    Number of Visits 1    Date for PT Re-Evaluation 07/04/21    Authorization Type [employer]- 60 visit limit    PT Start Time 0803    PT Stop Time 0905    PT Time Calculation (min) 62 min    Behavior During Therapy Anxious             Past Medical History:  Diagnosis Date   Clotting disorder ([place])    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 02/2010 Echo: EF 55-60%, no rwma.   Vitamin D deficiency    low    Past Surgical History:  Procedure Laterality Date   ABDOMINAL HYSTERECTOMY  1999   Total lap hyst BSO due to endometriosis   CARDIAC ELECTROPHYSIOLOGY MAPPING AND ABLATION  2011   COLONOSCOPY WITH PROPOFOL N/A 07/10/2015   Procedure: COLONOSCOPY WITH PROPOFOL;  Surgeon: Lollie Sails, MD;  Location: [facility] ENDOSCOPY;  Service: Endoscopy;  Laterality: N/A;   GALLBLADDER SURGERY     HERNIA REPAIR  12/2010   LEFT HEART CATH AND CORONARY ANGIOGRAPHY N/A 02/24/2017   Procedure: Left Heart Cath and Coronary Angiography;  Surgeon: Wellington Hampshire, MD;  Location: [facility];  Service: Cardiovascular;  Laterality: N/A;   TONSILLECTOMY      There were no vitals filed for this visit.    Subjective Assessment - 07/04/21 0811     Subjective "I am concerned about my left ankle as  well as my leg."    Pertinent History 60 yo Female s/p fall with subsequent closed non-displaced segmental fracture of left fibula on 05/17/21. She fell coming out of a Restaurant, stepping off the curb. She was casted for 6 weeks and has now been given a walking boot which she will wear for 6 weeks.  MD order states, "May gradually advance weight bearing as tolerated in the boot. Once full weightbearing, may wean out of boot" She also suffered an L1 compression fracture (25% loss of height). She reports orthopedic MD has not evaluated her ankle. She has a referral to [facility] to evaluate ankle later today. 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 As far as she is aware she has not had a clot at this point. She also has osteopenia. Pt is unable to don/doff boot independently due to difficulty bending over, husband helps her; Pt reports increased back pain with difficulty getting in/out of bed and has since been sleeping and living in a lift chair. PMH significant: Clotting disorder, HLD, hypothyroidism, Osteopenia, SVT, vitamin D deficiency    How long can you stand comfortably? Unsure- hasn't started weight  bearing in LLE at this time    How long can you walk comfortably? limited- hasn't started weight bearing in LLE; will sit on knee scooter and scoot around short distances as needed.    Diagnostic tests 10/2: MRI of lumbar spine: Evolving subacute compression fracture involving the superior  endplate of L1 with mild 25% height loss and trace 2 mm bony  retropulsion. No associated stenosis.  2. Small left foraminal disc protrusion with annular fissure at  L4-5, closely approximating and potentially irritating the exiting  left L4 nerve root.  3. Mild-to-moderate facet hypertrophy at L3-4 through L5-S1. X-ray of LLE: 1.  Mildly displaced oblique extra-articular fracture of the proximal left fibular diaphysis.   2.  Nondisplaced left lateral malleolar  fracture.   3.  There is no significant osteoarthrosis.    Currently in Pain? Yes    Pain Score 4     Pain Location Leg    Pain Orientation Left;Lateral    Pain Descriptors / Indicators Other (Comment);Aching   stinging   Pain Type Acute pain    Pain Radiating Towards radiates up to knee    Pain Onset More than a month ago    Pain Frequency Intermittent    Aggravating Factors  unsure/random    Pain Relieving Factors unsure; does use ice intermittently which does help;    Effect of Pain on Daily Activities unsure    Multiple Pain Sites Yes    Pain Score 6    Pain Location Ankle    Pain Orientation Left;Anterior;Posterior    Pain Descriptors / Indicators Sore;Burning;Aching    Pain Type Acute pain    Pain Onset More than a month ago    Pain Frequency Intermittent    Aggravating Factors  worse with movement; unsure- hasn't tried weight bearing    Pain Relieving Factors elevate, will take tylenol    Effect of Pain on Daily Activities decreased activity tolerance;    Pain Score 8    Pain Location Back    Pain Orientation Lower    Pain Descriptors / Indicators Burning;Sharp;Aching;Throbbing    Pain Type Chronic pain    Pain Onset More than a month ago    Pain Frequency Constant    Aggravating Factors  unable to bend far,    Pain Relieving Factors Good back support;    Effect of Pain on Daily Activities decreased activity tolerance;                 Patient presents to therapy for evaluation. However PT unable to assess patient due to time constraints with lengthy history intake. Will obtain objective measures and assess patient at next visit.                                 Patient will benefit from skilled therapeutic intervention in order to improve the following deficits and impairments:     Visit Diagnosis: Pain in left lower leg     Problem List Patient Active Problem List   Diagnosis Date Noted   Closed compression fracture of  body of L1 vertebra ([place]) 06/11/2021   Cervical radiculopathy 06/11/2021   Fibula fracture 06/11/2021   Lower resp. 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

## 2021-07-04 NOTE — Telephone Encounter (Signed)
Left message for patient to return call back.  According to chart hypothyroidism has been marked resolved by another provider. Saying she no longer has that issue. But as for Hyperlipidemia, that is still an active dx due to her cholesterol being so elevated for multiple years.

## 2021-07-04 NOTE — Telephone Encounter (Signed)
Called pt back. Pt states she does not have hypothyroidism or hyperlipidemia and would like them removed from her chart.

## 2021-07-04 NOTE — Telephone Encounter (Signed)
Patient called in stating that she went to physical therapy this morning and noticed in her chart that she was diagnosed with hypothyroidism and hyperlipidemia but she has never had either one of those conditions.She would like a call back from [place] or [place] to help her with this.Please call her at [phone] or [phone] if you call her tomorrow.

## 2021-07-05 NOTE — Telephone Encounter (Signed)
It looks like she had an elevated TSH in the past and was diagnosed with subclinical hypothyroidism and saw endocrinology for this in October 2018.  More recently her TSH has been in the acceptable range and given that I can certainly resolve the issue of subclinical hypothyroidism from her problem list.  Though given that this was an appropriate diagnosis it cannot be removed from her history.  Her LDL has been elevated over the last 4 years.  It is not elevated to the point that she needs medication for this.  We have discussed her elevated LDL at prior follow-up visits.  This does qualify her for a diagnosis of hyperlipidemia as she does have an elevated LDL cholesterol.

## 2021-07-11 ENCOUNTER — Ambulatory Visit: Payer: Managed Care, Other (non HMO)

## 2021-07-11 ENCOUNTER — Other Ambulatory Visit: Payer: Self-pay

## 2021-07-11 DIAGNOSIS — I2510 Atherosclerotic heart disease of native coronary artery without angina pectoris: Secondary | ICD-10-CM | POA: Diagnosis not present

## 2021-07-11 DIAGNOSIS — M79662 Pain in left lower leg: Secondary | ICD-10-CM

## 2021-07-11 DIAGNOSIS — R0789 Other chest pain: Secondary | ICD-10-CM | POA: Diagnosis not present

## 2021-07-11 DIAGNOSIS — R2689 Other abnormalities of gait and mobility: Secondary | ICD-10-CM

## 2021-07-11 DIAGNOSIS — M6281 Muscle weakness (generalized): Secondary | ICD-10-CM

## 2021-07-11 NOTE — Therapy (Signed)
[place] MAIN [facility] SERVICES [address] [place], [place], [address] Phone: [phone]   Fax:  [phone]  Physical Therapy Treatment  Patient Details  Name: Michelle Armstrong MRN: [id number] Date of Birth: 1960-12-02 Referring Provider (PT): Hildred Alamin PA   Encounter Date: 07/11/2021   PT End of Session - 07/11/21 1030     Visit Number 1    Number of Visits 16    Date for PT Re-Evaluation 09/05/21    Authorization Type 1/10 eval 07/11/21    PT Start Time 0800    PT Stop Time 0845    PT Time Calculation (min) 45 min    Equipment Utilized During Treatment Gait belt    Activity Tolerance Patient tolerated treatment well    Behavior During Therapy [facility] for tasks assessed/performed;Anxious             Past Medical History:  Diagnosis Date   Clotting disorder ([place])    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 02/2010 Echo: EF 55-60%, no rwma.   Vitamin D deficiency    low    Past Surgical History:  Procedure Laterality Date   ABDOMINAL HYSTERECTOMY  1999   Total lap hyst BSO due to endometriosis   CARDIAC ELECTROPHYSIOLOGY MAPPING AND ABLATION  2011   COLONOSCOPY WITH PROPOFOL N/A 07/10/2015   Procedure: COLONOSCOPY WITH PROPOFOL;  Surgeon: Lollie Sails, MD;  Location: [facility] ENDOSCOPY;  Service: Endoscopy;  Laterality: N/A;   GALLBLADDER SURGERY     HERNIA REPAIR  12/2010   LEFT HEART CATH AND CORONARY ANGIOGRAPHY N/A 02/24/2017   Procedure: Left Heart Cath and Coronary Angiography;  Surgeon: Wellington Hampshire, MD;  Location: [place] CV LAB;  Service: Cardiovascular;  Laterality: N/A;    TONSILLECTOMY      There were no vitals filed for this visit.   Subjective Assessment - 07/11/21 0919     Subjective Patient presents in her boot. Has seen Dr. Mack Guise for her proximal and distal fibular fracture and has been approved for WBAT by Dr. Mack Guise.    Pertinent History 60 yo Female s/p fall with subsequent closed non-displaced segmental fracture of left fibula on 05/17/21. She fell coming out of a Restaurant, stepping off the curb. She was casted for 6 weeks and has now been given a walking boot which she will wear for 6 weeks.  MD order states, "May gradually advance weight bearing as tolerated in the boot. Once full weightbearing, may wean out of boot" She also suffered an L1 compression fracture (25% loss of height). She reports orthopedic MD has not evaluated her ankle. She has a referral to [facility] to evaluate ankle later today. 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 As far as she is aware she has not had a clot at this point. She also has osteopenia. Pt is unable to don/doff boot independently due to difficulty bending over, husband helps her; Pt reports increased back pain with difficulty getting in/out of  bed and has since been sleeping and living in a lift chair. PMH significant: Clotting disorder, HLD, hypothyroidism, Osteopenia, SVT, vitamin D deficiency    How long can you stand comfortably? Unsure- hasn't started weight bearing in LLE at this time    How long can you walk comfortably? limited- hasn't started weight bearing in LLE; will sit on knee scooter and scoot around short distances as needed.    Diagnostic tests 10/2: MRI of lumbar spine: Evolving subacute compression fracture involving the superior  endplate of L1 with mild 25% height loss and trace 2 mm bony  retropulsion. No associated stenosis.  2. Small left foraminal disc protrusion with annular fissure at  L4-5, closely approximating and potentially  irritating the exiting  left L4 nerve root.  3. Mild-to-moderate facet hypertrophy at L3-4 through L5-S1. X-ray of LLE: 1.  Mildly displaced oblique extra-articular fracture of the proximal left fibular diaphysis.   2.  Nondisplaced left lateral malleolar fracture.   3.  There is no significant osteoarthrosis.    Currently in Pain? Yes    Pain Score 4     Pain Location Leg    Pain Orientation Left    Pain Descriptors / Indicators Aching    Pain Type Chronic pain    Pain Onset More than a month ago    Pain Frequency Intermittent    Pain Onset --    Pain Onset --                [facility] PT Assessment - 07/11/21 0001       Assessment   Medical Diagnosis proximal and distal fibula fracture.    Referring Provider (PT) Hildred Alamin PA    Onset Date/Surgical Date --   05/17/21.     Precautions   Precautions Other (comment);Shoulder   foot/ankle   Required Braces or Orthoses Other Brace/Splint   L boot   Other Brace/Splint L boot      Restrictions   Weight Bearing Restrictions Yes    LLE Weight Bearing Weight bearing as tolerated    Other Position/Activity Restrictions none, WBAT per Dr Mack Guise      Balance Screen   Has the patient fallen in the past 6 months Yes    Has the patient had a decrease in activity level because of a fear of falling?  Yes      [place] Private residence    Living Arrangements Spouse/significant other    Available Help at [place] seat;Wheelchair - manual      Prior Function   Level of Independence Independent    Vocation --   [employer]                 PAIN: Patient reports pain in L ankle/foot, L and right hips, and back  POSTURE: Weight shifted off of LLE with LLE in boot.   PROM/AROM:   Ankle ROM Seated: Motion: PROM long sitting Right Left  Plantarflexion WF 18 *  Dorsiflexion WFL 3*  Inversion WFL Not tested due to pain  Eversion WFL Not tested due to pain   *painful  Ankle ROM Seated: Motion: AROM long sitting Right Left  Plantarflexion WFL 10*  Dorsiflexion WFL -2*  Inversion WFL 10*  Eversion WFL 8*  *painful  Knee ROM:  L knee flexion: 106 degrees L knee extension -4 degrees  R knee: WFL  Accessory Motions:  Not tested this session  STRENGTH:  Graded on a 0-5 scale Muscle Group Left Right  Knee Flex 3/5 5/5  Knee Ext 3/5 5/5  Ankle DF 2+/5 5/5  Ankle PF 2+/5 5/5  Ankle eversion 2/5 5/5  Ankle Inversion 2/5 5/5    SPECIAL TESTS: Deferred at this time due to pain  FUNCTIONAL MOBILITY:  STS with boot on L foot: R foot back with primary force of movement, minimal weight shift onto affected LLE  STS with walker: safe technique; will continue education next session.   BALANCE: Not able to be assessed at evaluation due to limited tolerance for standing   GAIT: Not assessed at evaluation due to patient being limited in standing by pain. Patient is safe for ambulation per MD  OUTCOME MEASURES: TEST Outcome Interpretation  LEFS Given to bring back   FOTO 5% Predicted discharge score 47%            Access Code: [email or web address] URL: [email or web address] Date: 07/11/2021 Prepared by: Janna Arch  Exercises Long Sitting Ankle Pumps - 1 x daily - 7 x weekly - 2 sets - 10 reps - 5 hold Seated Ankle Circles - 1 x daily - 7 x weekly - 2 sets - 10 reps - 5 hold Seated Long Arc Quad - 1 x daily - 7 x weekly - 2 sets - 10 reps - 5 hold   Patient presents for evaluation s/p fall with subsequent closed non-displaced segmental fracture of left fibula on 05/17/21. Patient has been cleared for WBAT by Dr. Mack Guise. Patient is fearful of weightbearing due to her recent trauma but is agreeable to attempt it with PT this session. Her ROM is assessed as well as strength and is limited but is as expected at her current level s/p fracture of LLE. Patient will benefit from transfer training as well as proper use of a RW  next session. She is given HEP for strengthening and mobility to slowly increase AROM of patient. Patient will bring back filled out LEFS for addition to goals at this time. Patient will benefit from skilled physical therapy to increase LE strength, ROM, mobility, and return patient to PLOF.          PT Education - 07/11/21 1017     Education Details exercise technique, body mechanics    Person(s) Educated Patient    Methods Explanation;Demonstration;Tactile cues;Verbal cues    Comprehension Verbalized understanding;Returned demonstration;Verbal cues required;Tactile cues required              PT Short Term Goals - 07/11/21 1443       PT SHORT TERM GOAL #1   Title Patient will be independent in home exercise program to improve strength/mobility for better functional independence with ADLs.    Baseline 10/26: HEP given    Time 4    Period Weeks    Status New    Target Date 08/08/21      PT SHORT TERM GOAL #2   Title Patient will report a worse VAS of 4/10 in LLE for improved tolerance of mobility and quality of life.    Time 4    Period Weeks    Status New    Target Date 08/08/21               PT Long Term Goals - 07/11/21 1444       PT LONG TERM GOAL #1   Title Patient will increase FOTO score to equal to or greater than 47%    to demonstrate statistically  significant improvement in mobility and quality of life.    Baseline 10/26: 5%    Time 8    Period Weeks    Status New    Target Date 09/05/21      PT LONG TERM GOAL #2   Title Patient will increase 10 meter walk test to >1.31m/s as to improve gait speed for better community ambulation and to reduce fall risk.    Baseline 10/26: unable to ambulate at this time    Time 8    Period Weeks    Status New    Target Date 09/05/21      PT LONG TERM GOAL #3   Title Patient will increase BLE gross strength to 4+/5 as to improve functional strength for independent gait, increased standing tolerance and  increased ADL ability.    Baseline 10/26: see note    Time 8    Period Weeks    Status New    Target Date 09/05/21      PT LONG TERM GOAL #4   Title Patient will tolerate weightbearing for >30 minutes for increased mobility and ambulation to increase indepenence with ADLs and iADLs    Baseline 10/26; able to tolerate 5 seconds    Time 8    Period Weeks    Status New    Target Date 09/05/21      PT LONG TERM GOAL #5   Title Patient will increase ankle ROM to 10 degrees within normal range for improved gait mechanics and reduced pain.    Baseline 10/26: see note    Time 8    Period Weeks    Status New    Target Date 09/05/21                   Plan - 07/11/21 1440     Clinical Impression Statement Patient presents for evaluation s/p fall with subsequent closed non-displaced segmental fracture of left fibula on 05/17/21. Patient has been cleared for WBAT by Dr. Mack Guise. Patient is fearful of weightbearing due to her recent trauma but is agreeable to attempt it with PT this session. Her ROM is assessed as well as strength and is limited but is as expected at her current level s/p fracture of LLE. Patient will benefit from transfer training as well as proper use of a RW next session. She is given HEP for strengthening and mobility to slowly increase AROM of patient. Patient will bring back filled out LEFS for addition to goals at this time. Patient will benefit from skilled physical therapy to increase LE strength, ROM, mobility, and return patient to PLOF.    Personal Factors and Comorbidities Age;Comorbidity 3+;Past/Current Experience;Sex;Time since onset of injury/illness/exacerbation;Transportation    Comorbidities HLD, hypothyroidism, ostepenia, osteoporis, SVT, clotting disorder    Examination-Activity Limitations Bathing;Bed Mobility;Bend;Caring for [employer];Locomotion Level;Lift;Hygiene/Grooming;Squat;Stairs;Stand;Toileting;Transfers     Examination-Participation Restrictions Church;Cleaning;Community Activity;Driving;Interpersonal Relationship;Personal Finances;Meal Prep;Laundry;Shop;Volunteer;Yard Work;Occupation    Stability/Clinical Decision Making Evolving/Moderate complexity    Clinical Decision Making Moderate    Rehab Potential Fair    PT Frequency 2x / week    PT Duration 8 weeks    PT Treatment/Interventions ADLs/Self Care Home Management;Aquatic Therapy;Biofeedback;Cryotherapy;Electrical Stimulation;Iontophoresis 4mg /ml Dexamethasone;Moist Heat;Ultrasound;DME Instruction;Gait training;Stair training;Functional mobility training;Neuromuscular re-education;Cognitive remediation;Balance training;Therapeutic exercise;Therapeutic activities;Patient/family education;Orthotic Fit/Training;Manual techniques;Compression bandaging;Manual lymph drainage;Scar mobilization;Passive range of motion;Energy conservation;Splinting;Taping;Vasopneumatic Device;Visual/perceptual remediation/compensation    PT Next Visit Plan transfer training with walker, attempt WB again, increase LE strength and ROM.    PT Home Exercise Plan see above  Consulted and Agree with Plan of Care Patient             Patient will benefit from skilled therapeutic intervention in order to improve the following deficits and impairments:  Abnormal gait, Cardiopulmonary status limiting activity, Decreased activity tolerance, Decreased balance, Decreased knowledge of precautions, Decreased endurance, Decreased knowledge of use of DME, Decreased range of motion, Decreased mobility, Difficulty walking, Decreased strength, Hypomobility, Increased edema, Impaired flexibility, Impaired perceived functional ability, Increased muscle spasms, Improper body mechanics, Pain  Visit Diagnosis: Pain in left lower leg  Other abnormalities of gait and mobility  Muscle weakness (generalized)     Problem List Patient Active Problem List   Diagnosis Date Noted   Closed  compression fracture of body of L1 vertebra ([place]) 06/11/2021   Cervical radiculopathy 06/11/2021   Fibula fracture 06/11/2021   Lower resp. 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 [place], [place], [address] Phone: [phone]   Fax:  [phone]  Name: Michelle Armstrong MRN: [id number] Date of Birth: 09-23-1960

## 2021-07-11 NOTE — Patient Instructions (Signed)
Access Code: [email or web address] URL: [email or web address] Date: 07/11/2021 Prepared by: Janna Arch  Exercises  Long Sitting Ankle Pumps - 1 x daily - 7 x weekly - 2 sets - 10 reps - 5 hold Seated Ankle Circles - 1 x daily - 7 x weekly - 2 sets - 10 reps - 5 hold Seated Long Arc Quad - 1 x daily - 7 x weekly - 2 sets - 10 reps - 5 hold

## 2021-07-13 ENCOUNTER — Other Ambulatory Visit: Payer: Self-pay

## 2021-07-13 ENCOUNTER — Ambulatory Visit: Payer: Managed Care, Other (non HMO)

## 2021-07-13 DIAGNOSIS — R2681 Unsteadiness on feet: Secondary | ICD-10-CM

## 2021-07-13 DIAGNOSIS — R2689 Other abnormalities of gait and mobility: Secondary | ICD-10-CM

## 2021-07-13 DIAGNOSIS — M6281 Muscle weakness (generalized): Secondary | ICD-10-CM

## 2021-07-13 DIAGNOSIS — M79662 Pain in left lower leg: Secondary | ICD-10-CM

## 2021-07-13 NOTE — Therapy (Signed)
[place] MAIN [facility] SERVICES [address] [place], [place], [address] Phone: [phone]   Fax:  [phone]  Physical Therapy Treatment  Patient Details  Name: Michelle Armstrong MRN: [id number] Date of Birth: 1961-07-26 Referring Provider (PT): Hildred Alamin PA   Encounter Date: 07/13/2021   PT End of Session - 07/13/21 0923     Visit Number 2    Number of Visits 16    Date for PT Re-Evaluation 09/05/21    Authorization Type 1/10 eval 07/11/21    PT Start Time 0805    PT Stop Time 0851    PT Time Calculation (min) 46 min    Equipment Utilized During Treatment Gait belt    Activity Tolerance Patient tolerated treatment well    Behavior During Therapy [facility] for tasks assessed/performed             Past Medical History:  Diagnosis Date   Clotting disorder ([place])    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 02/2010 Echo: EF 55-60%, no rwma.   Vitamin D deficiency    low    Past Surgical History:  Procedure Laterality Date   ABDOMINAL HYSTERECTOMY  1999   Total lap hyst BSO due to endometriosis   CARDIAC ELECTROPHYSIOLOGY MAPPING AND ABLATION  2011   COLONOSCOPY WITH PROPOFOL N/A 07/10/2015   Procedure: COLONOSCOPY WITH PROPOFOL;  Surgeon: Lollie Sails, MD;  Location: [facility] ENDOSCOPY;  Service: Endoscopy;  Laterality: N/A;   GALLBLADDER SURGERY     HERNIA REPAIR  12/2010   LEFT HEART CATH AND CORONARY ANGIOGRAPHY N/A 02/24/2017   Procedure: Left Heart Cath and Coronary Angiography;  Surgeon: Wellington Hampshire, MD;  Location: [place] CV LAB;  Service: Cardiovascular;  Laterality: N/A;    TONSILLECTOMY      There were no vitals filed for this visit.   Subjective Assessment - 07/13/21 0920     Subjective Pt wearing boot and has brought RW for adjustment at session. Pt reports low back pain and LLE pain. Reports overall improvement with swelling of LLE.    Pertinent History 60 yo Female s/p fall with subsequent closed non-displaced segmental fracture of left fibula on 05/17/21. She fell coming out of a Restaurant, stepping off the curb. She was casted for 6 weeks and has now been given a walking boot which she will wear for 6 weeks.  MD order states, "May gradually advance weight bearing as tolerated in the boot. Once full weightbearing, may wean out of boot" She also suffered an L1 compression fracture (25% loss of height). She reports orthopedic MD has not evaluated her ankle. She has a referral to [facility] to evaluate ankle later today. 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 As far as she is aware she has not had a clot at this point. She also has osteopenia. Pt is unable to don/doff boot independently due to difficulty bending over, husband helps her; Pt reports increased back pain with difficulty getting in/out  of bed and has since been sleeping and living in a lift chair. PMH significant: Clotting disorder, HLD, hypothyroidism, Osteopenia, SVT, vitamin D deficiency    How long can you stand comfortably? Unsure- hasn't started weight bearing in LLE at this time    How long can you walk comfortably? limited- hasn't started weight bearing in LLE; will sit on knee scooter and scoot around short distances as needed.    Diagnostic tests 10/2: MRI of lumbar spine: Evolving subacute compression fracture involving the superior  endplate of L1 with mild 25% height loss and trace 2 mm bony  retropulsion. No associated stenosis.  2. Small left foraminal disc protrusion with annular fissure at  L4-5, closely approximating and potentially  irritating the exiting  left L4 nerve root.  3. Mild-to-moderate facet hypertrophy at L3-4 through L5-S1. X-ray of LLE: 1.  Mildly displaced oblique extra-articular fracture of the proximal left fibular diaphysis.   2.  Nondisplaced left lateral malleolar fracture.   3.  There is no significant osteoarthrosis.    Currently in Pain? Yes    Pain Location Leg    Pain Orientation Left    Pain Onset More than a month ago    Pain Location Back    Pain Orientation Lower              INTERVENTIONS  Pt performed the following exercises for 1-3 sets each for 10 or more reps per set: -Seated LLE ankle pumps -Seated LLE ankle circles CW/CC -Seated LLE LAQ with 2 sec holds at end range -LLE hamstring curl isometrics into WC leg rest pad   LLE towel scrunches, seated, performed for approx 2 minutes.   PT instructs pt in modified STS technique from [facility] to RW to avoid increased spinal flexion. Pt performs 3 STS throughout session. Pt demos ability to perform STS with BLEs in parallel positioning to promote increased weightbearing through LLE. Pt exhibits good BUE strength for transfers and good eccentric control with stand>sit.   Assessment for proper height of RW. Pt comes in with RW adjusted to appropriate height.  With pt standing at RW, CGA x2, pt performs the following interventions for multiple reps: -LE lateral weight shifts to promote increased weightbearing through LLE  -LLE lateral>medial steps.  -With pt's LLE placed ahead of RLE, pt performs forward/backward weight shifts  -LLE forward/backward steps  PT provides instruction for therex throughout in the form of VC/TC or demo.      PT Education - 07/13/21 0922     Education Details transfers, exercise technique, body mechanics    Person(s) Educated Patient;Spouse    Methods Explanation;Demonstration;Verbal cues    Comprehension Verbalized understanding;Returned demonstration              PT Short Term Goals - 07/11/21  1443       PT SHORT TERM GOAL #1   Title Patient will be independent in home exercise program to improve strength/mobility for better functional independence with ADLs.    Baseline 10/26: HEP given    Time 4    Period Weeks    Status New    Target Date 08/08/21      PT SHORT TERM GOAL #2   Title Patient will report a worse VAS of 4/10 in LLE for improved tolerance of mobility and quality of life.    Time 4    Period Weeks    Status New    Target Date 08/08/21  PT Long Term Goals - 07/11/21 1444       PT LONG TERM GOAL #1   Title Patient will increase FOTO score to equal to or greater than 47%    to demonstrate statistically significant improvement in mobility and quality of life.    Baseline 10/26: 5%    Time 8    Period Weeks    Status New    Target Date 09/05/21      PT LONG TERM GOAL #2   Title Patient will increase 10 meter walk test to >1.53m/s as to improve gait speed for better community ambulation and to reduce fall risk.    Baseline 10/26: unable to ambulate at this time    Time 8    Period Weeks    Status New    Target Date 09/05/21      PT LONG TERM GOAL #3   Title Patient will increase BLE gross strength to 4+/5 as to improve functional strength for independent gait, increased standing tolerance and increased ADL ability.    Baseline 10/26: see note    Time 8    Period Weeks    Status New    Target Date 09/05/21      PT LONG TERM GOAL #4   Title Patient will tolerate weightbearing for >30 minutes for increased mobility and ambulation to increase indepenence with ADLs and iADLs    Baseline 10/26; able to tolerate 5 seconds    Time 8    Period Weeks    Status New    Target Date 09/05/21      PT LONG TERM GOAL #5   Title Patient will increase ankle ROM to 10 degrees within normal range for improved gait mechanics and reduced pain.    Baseline 10/26: see note    Time 8    Period Weeks    Status New    Target Date 09/05/21                    Plan - 07/13/21 7902     Clinical Impression Statement Pt highly motivated throughout session and shows progress with increased weightbearing through LLE with interventions. Appointment focus was on transfer training, AROM, and progressive weightbearing exercises for LLE. PT did instruct pt in modified STS technique due to pt's L1 compression fracture and pt was able to demonstrate good technique following cues. The pt will benefit from further skilled PT to continue to increase LE strength, ROM, and weightbearing through LLE in order to return pt to PLOF.    Personal Factors and Comorbidities Age;Comorbidity 3+;Past/Current Experience;Sex;Time since onset of injury/illness/exacerbation;Transportation    Comorbidities HLD, hypothyroidism, ostepenia, osteoporis, SVT, clotting disorder    Examination-Activity Limitations Bathing;Bed Mobility;Bend;Caring for [employer];Locomotion Level;Lift;Hygiene/Grooming;Squat;Stairs;Stand;Toileting;Transfers    Examination-Participation Restrictions Church;Cleaning;Community Activity;Driving;Interpersonal Relationship;Personal Finances;Meal Prep;Laundry;Shop;Volunteer;Yard Work;Occupation    Stability/Clinical Decision Making Evolving/Moderate complexity    Rehab Potential Fair    PT Frequency 2x / week    PT Duration 8 weeks    PT Treatment/Interventions ADLs/Self Care Home Management;Aquatic Therapy;Biofeedback;Cryotherapy;Electrical Stimulation;Iontophoresis 4mg /ml Dexamethasone;Moist Heat;Ultrasound;DME Instruction;Gait training;Stair training;Functional mobility training;Neuromuscular re-education;Cognitive remediation;Balance training;Therapeutic exercise;Therapeutic activities;Patient/family education;Orthotic Fit/Training;Manual techniques;Compression bandaging;Manual lymph drainage;Scar mobilization;Passive range of motion;Energy conservation;Splinting;Taping;Vasopneumatic Device;Visual/perceptual  remediation/compensation    PT Next Visit Plan transfer training with walker, attempt WB again, increase LE strength and ROM. Continue POC as previously indicated    PT Home Exercise Plan no updates currently    Consulted and Agree with Plan of Care Patient  Patient will benefit from skilled therapeutic intervention in order to improve the following deficits and impairments:  Abnormal gait, Cardiopulmonary status limiting activity, Decreased activity tolerance, Decreased balance, Decreased knowledge of precautions, Decreased endurance, Decreased knowledge of use of DME, Decreased range of motion, Decreased mobility, Difficulty walking, Decreased strength, Hypomobility, Increased edema, Impaired flexibility, Impaired perceived functional ability, Increased muscle spasms, Improper body mechanics, Pain  Visit Diagnosis: Pain in left lower leg  Other abnormalities of gait and mobility  Muscle weakness (generalized)  Unsteadiness on feet     Problem List Patient Active Problem List   Diagnosis Date Noted   Closed compression fracture of body of L1 vertebra ([place]) 06/11/2021   Cervical radiculopathy 06/11/2021   Fibula fracture 06/11/2021   Lower resp. 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, [place],  [address] Phone: [phone]   Fax:  [phone]  Name: Michelle Armstrong MRN: [id number] Date of Birth: [birth year]

## 2021-07-14 ENCOUNTER — Other Ambulatory Visit: Payer: Self-pay

## 2021-07-14 ENCOUNTER — Encounter: Payer: Self-pay | Admitting: Emergency Medicine

## 2021-07-14 ENCOUNTER — Inpatient Hospital Stay
Admission: EM | Admit: 2021-07-14 | Discharge: 2021-07-16 | DRG: 282 | Disposition: A | Discharge status: Home / Self Care | Payer: Managed Care, Other (non HMO) | Attending: Student | Admitting: Student

## 2021-07-14 ENCOUNTER — Emergency Department: Payer: Managed Care, Other (non HMO)

## 2021-07-14 DIAGNOSIS — E785 Hyperlipidemia, unspecified: Secondary | ICD-10-CM | POA: Diagnosis present

## 2021-07-14 DIAGNOSIS — Z880 Allergy status to penicillin: Secondary | ICD-10-CM | POA: Diagnosis not present

## 2021-07-14 DIAGNOSIS — R002 Palpitations: Secondary | ICD-10-CM | POA: Diagnosis present

## 2021-07-14 DIAGNOSIS — Z79899 Other long term (current) drug therapy: Secondary | ICD-10-CM

## 2021-07-14 DIAGNOSIS — Z832 Family history of diseases of the blood and blood-forming organs and certain disorders involving the immune mechanism: Secondary | ICD-10-CM

## 2021-07-14 DIAGNOSIS — Z8052 Family history of malignant neoplasm of bladder: Secondary | ICD-10-CM

## 2021-07-14 DIAGNOSIS — Z881 Allergy status to other antibiotic agents status: Secondary | ICD-10-CM

## 2021-07-14 DIAGNOSIS — Z8349 Family history of other endocrine, nutritional and metabolic diseases: Secondary | ICD-10-CM

## 2021-07-14 DIAGNOSIS — Z20822 Contact with and (suspected) exposure to covid-19: Secondary | ICD-10-CM | POA: Diagnosis present

## 2021-07-14 DIAGNOSIS — Z888 Allergy status to other drugs, medicaments and biological substances status: Secondary | ICD-10-CM

## 2021-07-14 DIAGNOSIS — R0789 Other chest pain: Secondary | ICD-10-CM

## 2021-07-14 DIAGNOSIS — R911 Solitary pulmonary nodule: Secondary | ICD-10-CM

## 2021-07-14 DIAGNOSIS — I214 Non-ST elevation (NSTEMI) myocardial infarction: Secondary | ICD-10-CM

## 2021-07-14 DIAGNOSIS — Z91040 Latex allergy status: Secondary | ICD-10-CM

## 2021-07-14 DIAGNOSIS — Z789 Other specified health status: Secondary | ICD-10-CM | POA: Diagnosis present

## 2021-07-14 DIAGNOSIS — I21A1 Myocardial infarction type 2: Secondary | ICD-10-CM | POA: Diagnosis present

## 2021-07-14 DIAGNOSIS — I471 Supraventricular tachycardia: Secondary | ICD-10-CM | POA: Diagnosis not present

## 2021-07-14 DIAGNOSIS — Z7982 Long term (current) use of aspirin: Secondary | ICD-10-CM

## 2021-07-14 DIAGNOSIS — Z8249 Family history of ischemic heart disease and other diseases of the circulatory system: Secondary | ICD-10-CM | POA: Diagnosis not present

## 2021-07-14 DIAGNOSIS — R079 Chest pain, unspecified: Secondary | ICD-10-CM | POA: Diagnosis not present

## 2021-07-14 DIAGNOSIS — I2510 Atherosclerotic heart disease of native coronary artery without angina pectoris: Principal | ICD-10-CM | POA: Diagnosis present

## 2021-07-14 DIAGNOSIS — R778 Other specified abnormalities of plasma proteins: Secondary | ICD-10-CM | POA: Diagnosis not present

## 2021-07-14 DIAGNOSIS — Z83438 Family history of other disorder of lipoprotein metabolism and other lipidemia: Secondary | ICD-10-CM

## 2021-07-14 DIAGNOSIS — M810 Age-related osteoporosis without current pathological fracture: Secondary | ICD-10-CM | POA: Diagnosis present

## 2021-07-14 LAB — CBC
HCT: 41.3 % (ref 36.0–46.0)
Hemoglobin: 14.4 g/dL (ref 12.0–15.0)
MCH: 31 pg (ref 26.0–34.0)
MCHC: 34.9 g/dL (ref 30.0–36.0)
MCV: 88.8 fL (ref 80.0–100.0)
Platelets: 308 10*3/uL (ref 150–400)
RBC: 4.65 MIL/uL (ref 3.87–5.11)
RDW: 13.6 % (ref 11.5–15.5)
WBC: 6.6 10*3/uL (ref 4.0–10.5)
nRBC: 0 % (ref 0.0–0.2)

## 2021-07-14 LAB — PROTIME-INR
INR: 1 (ref 0.8–1.2)
Prothrombin Time: 13.4 seconds (ref 11.4–15.2)

## 2021-07-14 LAB — BASIC METABOLIC PANEL
Anion gap: 11 (ref 5–15)
BUN: 28 mg/dL — ABNORMAL HIGH (ref 6–20)
CO2: 25 mmol/L (ref 22–32)
Calcium: 9.8 mg/dL (ref 8.9–10.3)
Chloride: 103 mmol/L (ref 98–111)
Creatinine, Ser: 0.63 mg/dL (ref 0.44–1.00)
GFR, Estimated: >60 mL/min (ref >60)
Glucose, Bld: 110 mg/dL — ABNORMAL HIGH (ref 70–99)
Potassium: 3.6 mmol/L (ref 3.5–5.1)
Sodium: 139 mmol/L (ref 135–145)

## 2021-07-14 LAB — TROPONIN I (HIGH SENSITIVITY)
Troponin I (High Sensitivity): <2 ng/L (ref <18)
Troponin I (High Sensitivity): 163 ng/L (ref <18)
Troponin I (High Sensitivity): 249 ng/L (ref <18)
Troponin I (High Sensitivity): 300 ng/L (ref <18)

## 2021-07-14 LAB — RESP PANEL BY RT-PCR (FLU A&B, COVID) ARPGX2
Influenza A by PCR: NEGATIVE
Influenza B by PCR: NEGATIVE
SARS Coronavirus 2 by RT PCR: NEGATIVE

## 2021-07-14 LAB — D-DIMER, QUANTITATIVE: D-Dimer, Quant: 0.58 ug/mL-FEU — ABNORMAL HIGH (ref 0.00–0.50)

## 2021-07-14 LAB — APTT: aPTT: 25 seconds (ref 24–36)

## 2021-07-14 MED ORDER — ACETAMINOPHEN 325 MG PO TABS
650.0000 mg | ORAL_TABLET | Freq: Once | ORAL | Status: AC
  Administered 2021-07-14: 650 mg via ORAL
  Filled 2021-07-14: qty 2

## 2021-07-14 MED ORDER — ACETAMINOPHEN 325 MG PO TABS
650.0000 mg | ORAL_TABLET | ORAL | Status: DC | PRN
  Administered 2021-07-15 – 2021-07-16 (×4): 650 mg via ORAL
  Filled 2021-07-14 (×4): qty 2

## 2021-07-14 MED ORDER — ONDANSETRON HCL 4 MG/2ML IJ SOLN: 4.0000 mg | Freq: Four times a day (QID) | INTRAMUSCULAR | Status: DC | PRN

## 2021-07-14 MED ORDER — ASPIRIN 81 MG PO CHEW
243.0000 mg | CHEWABLE_TABLET | Freq: Once | ORAL | Status: AC
  Administered 2021-07-14: 243 mg via ORAL
  Filled 2021-07-14: qty 3

## 2021-07-14 MED ORDER — HEPARIN (PORCINE) 25000 UT/250ML-% IV SOLN
950.0000 [IU]/h | INTRAVENOUS | Status: DC
  Administered 2021-07-14: 1100 [IU]/h via INTRAVENOUS
  Filled 2021-07-14 (×2): qty 250

## 2021-07-14 MED ORDER — ASPIRIN EC 81 MG PO TBEC
81.0000 mg | DELAYED_RELEASE_TABLET | Freq: Every day | ORAL | Status: DC
  Administered 2021-07-15 – 2021-07-16 (×2): 81 mg via ORAL
  Filled 2021-07-14 (×2): qty 1

## 2021-07-14 MED ORDER — HEPARIN SODIUM (PORCINE) 5000 UNIT/ML IJ SOLN
4000.0000 [IU] | Freq: Once | INTRAMUSCULAR | Status: AC
  Administered 2021-07-14: 4000 [IU] via INTRAVENOUS
  Filled 2021-07-14: qty 1

## 2021-07-14 MED ORDER — ALPRAZOLAM 0.25 MG PO TABS
0.2500 mg | ORAL_TABLET | Freq: Two times a day (BID) | ORAL | Status: DC | PRN
  Administered 2021-07-15: 0.25 mg via ORAL
  Filled 2021-07-14: qty 1

## 2021-07-14 MED ORDER — NITROGLYCERIN 0.4 MG SL SUBL: 0.4000 mg | SUBLINGUAL_TABLET | SUBLINGUAL | Status: DC | PRN

## 2021-07-14 MED ORDER — ATORVASTATIN CALCIUM 20 MG PO TABS
40.0000 mg | ORAL_TABLET | Freq: Every day | ORAL | Status: DC
  Filled 2021-07-14: qty 2

## 2021-07-14 MED ORDER — NITROGLYCERIN 0.4 MG SL SUBL
0.4000 mg | SUBLINGUAL_TABLET | SUBLINGUAL | Status: DC | PRN
  Administered 2021-07-14 (×2): 0.4 mg via SUBLINGUAL
  Filled 2021-07-14 (×2): qty 1

## 2021-07-14 MED ORDER — IOHEXOL 350 MG/ML SOLN
75.0000 mL | Freq: Once | INTRAVENOUS | Status: AC | PRN
  Administered 2021-07-14: 75 mL via INTRAVENOUS

## 2021-07-14 MED ORDER — MIDAZOLAM HCL 2 MG/2ML IJ SOLN
2.0000 mg | Freq: Once | INTRAMUSCULAR | Status: AC
  Administered 2021-07-14: 2 mg via INTRAVENOUS
  Filled 2021-07-14: qty 2

## 2021-07-14 NOTE — ED Triage Notes (Signed)
Pt via POV from home. Pt c/o L sided CP that radiates to the L side of her back, dizziness, and nausea that started approx 1100 today. Pt states that she had palpitations yesterday for 10-33mins and it went away. Pt states she took a baby ASA before she came.  Pt is A&OX4 and NAD

## 2021-07-14 NOTE — ED Provider Notes (Signed)
[facility] Emergency Department Provider Note ____________________________________________   Event Date/Time   First MD Initiated Contact with Patient 07/14/21 1622     (approximate)  I have reviewed the triage vital signs and the nursing notes.  HISTORY  Chief Complaint Chest Pain   HPI Michelle Armstrong is a 60 y.o. femalewho presents to the ED for evaluation of chest pain.   Chart review indicates a "clotting disorder" but I do not see any heme-onc notes or actual diagnoses. She self-reports a genetic predisposition for procoagulability that runs in her family, with multiple direct family members with DVT/PE including her sibling and son.  But no personal history of DVT/PE or taking anticoagulation.  2018 left heart cath for NSTEMI with Dr. Fletcher Anon without evidence of obstructive CAD.  Noted sluggish flow to LAD, concern for possible endothelial dysfunction.  She presents today to the ED, accompanied by her husband, for evaluation of chest pain and pressure.  She reports a 15-minute episode while seated yesterday where she felt palpitations and her heart rate increased to the 110s, per her watch.  Small amount of pressure that self resolved with the palpitations after a few minutes.  No recurrence until today, around 10:30 AM when she developed chest pressure left side of her chest, radiating across her chest, to her left-sided back and down her left arm.  Paresthesias to bilateral hands.  Sensation of shortness of breath alongside this.  This pressure has been constant for the past few hours since 1030, up to 8/10 intensity.  She is currently in a walking boot to her left leg after a mechanical fall causing a fibular fracture 2 months ago that has been managed nonoperatively.  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 02/2010 Echo: EF 55-60%, no rwma.   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   Need for hepatitis C screening test 05/22/2017   Hyperlipidemia 02/24/2017   SVT s/p ablation 01/09/2010    Past Surgical History:  Procedure Laterality Date   ABDOMINAL HYSTERECTOMY  1999   Total  lap hyst BSO due to endometriosis   CARDIAC ELECTROPHYSIOLOGY MAPPING AND ABLATION  2011   COLONOSCOPY WITH PROPOFOL N/A 07/10/2015   Procedure: COLONOSCOPY WITH PROPOFOL;  Surgeon: Lollie Sails, MD;  Location: [facility] ENDOSCOPY;  Service: Endoscopy;  Laterality: N/A;   GALLBLADDER SURGERY     HERNIA REPAIR  12/2010   LEFT HEART CATH AND CORONARY ANGIOGRAPHY N/A 02/24/2017   Procedure: Left Heart Cath and Coronary Angiography;  Surgeon: Wellington Hampshire, MD;  Location:  [place] CV LAB;  Service: Cardiovascular;  Laterality: N/A;   TONSILLECTOMY      Prior to Admission medications   Medication Sig Start Date End Date Taking? Authorizing Provider  Acetaminophen (TYLENOL PO) Take by mouth.    [provider]  Ascorbic Acid (VITAMIN C PO) Take by mouth.    [provider]  aspirin EC 81 MG tablet Take 81 mg by mouth daily. Swallow whole.    [provider]  azithromycin (ZITHROMAX) 500 MG tablet Take 1 tablet (500 mg total) by mouth daily. Patient not taking: Reported on 06/08/2021 04/20/21   Crecencio Mc, MD  benzonatate (TESSALON) 200 MG capsule Take 1 capsule (200 mg total) by mouth 3 (three) times daily as needed for cough. Patient not taking: Reported on 06/08/2021 04/20/21   Crecencio Mc, MD  Calcium Carbonate 500 MG CHEW Chew 1 tablet by mouth daily.    [provider]  cholecalciferol (VITAMIN D) 1000 units tablet Take by mouth daily.    [provider]  Ferrous Sulfate (IRON PO) Take by mouth.    [provider]  Multiple Vitamins-Minerals (MULTIVITAMIN PO) Take 1 tablet by mouth daily.    [provider]  nitrofurantoin, macrocrystal-monohydrate, (MACROBID) 100 MG capsule Take 1 capsule (100 mg total) by mouth 2 (two) times daily. 06/25/21   Leone Haven, MD  Thiamine HCl (VITAMIN B-1 PO) Take by mouth.    [provider]  tiZANidine (ZANAFLEX) 4 MG tablet Take 1 tablet (4 mg total) by mouth every 6 (six) hours as needed for muscle spasms. Patient not taking: Reported on 06/08/2021 04/20/21   Crecencio Mc, MD  traMADol (ULTRAM) 50 MG tablet Take 1 tablet (50 mg total) by mouth every 8 (eight) hours as needed. 07/03/21   Leone Haven, MD    Allergies Cephalexin, Latex, Metoprolol, Nsaids, Penicillin g, and Pollen extract  Family History  Problem Relation Age of Onset   Heart disease Father        s/p CABG   Obesity Father    Hyperlipidemia Father     Bladder Cancer Father    Drug abuse Father    Thyroid disease Sister    Clotting disorder Sister        gene mutation   Breast cancer Neg Hx     Social History Social History   Tobacco Use   Smoking status: Never   Smokeless tobacco: Never   Tobacco comments:    tobacco use- no   Vaping Use   Vaping Use: Never used  Substance Use Topics   Alcohol use: Yes    Alcohol/week: 0.0 standard drinks    Comment: rare glass of wine.   Drug use: No    Review of Systems  Constitutional: No fever/chills Eyes: No visual changes. ENT: No sore throat. Cardiovascular: Positive for palpitations and chest pain. Respiratory: Positive for shortness of breath. Gastrointestinal: No abdominal pain.  No nausea, no vomiting.  No diarrhea.  No constipation.  Genitourinary: Negative for dysuria. Musculoskeletal: Negative for back pain. Skin: Negative for rash. Neurological: Negative for headaches, focal weakness or numbness.  ____________________________________________   PHYSICAL EXAM:  VITAL SIGNS: Vitals:   07/14/21 2015 07/14/21 2030  BP: 98/70 106/65  Pulse: 82 75  Resp: 11 16  Temp:    SpO2: 96% 94%     Constitutional: Alert and oriented. Well appearing and in no acute distress. Eyes: Conjunctivae are normal. PERRL. EOMI. Head: Atraumatic. Nose: No congestion/rhinnorhea. Mouth/Throat: Mucous membranes are moist.  Oropharynx non-erythematous. Neck: No stridor. No cervical spine tenderness to palpation. Cardiovascular: Normal rate, regular rhythm. Grossly normal heart sounds.  Good peripheral circulation. Respiratory: Normal respiratory effort.  No retractions. Lungs CTAB. Gastrointestinal: Soft , nondistended, nontender to palpation. No CVA tenderness. Musculoskeletal: No joint effusions. No signs of acute trauma. Boot to left leg. No gross pitting edema.  Some dependent bruises to her left heel and ankle that have been there, reportedly, since the injury.  No other skin  changes or discoloration to the left leg Neurologic:  Normal speech and language. No gross focal neurologic deficits are appreciated.  Skin:  Skin is warm, dry and intact. No rash noted. Psychiatric: Mood and affect are anxious. Speech and behavior are normal.  ____________________________________________   LABS (all labs ordered are listed, but only abnormal results are displayed)  Labs Reviewed  BASIC METABOLIC PANEL - Abnormal; Notable for the following components:      Result Value   Glucose, Bld 110 (*)    BUN 28 (*)    All other components within normal limits  D-DIMER, QUANTITATIVE - Abnormal; Notable for the following components:   D-Dimer, Quant 0.58 (*)    All other components within normal limits  TROPONIN I (HIGH SENSITIVITY) - Abnormal; Notable for the following components:   Troponin I (High Sensitivity) 163 (*)    All other components within normal limits  TROPONIN I (HIGH SENSITIVITY) - Abnormal; Notable for the following components:   Troponin I (High Sensitivity) 249 (*)    All other components within normal limits  TROPONIN I (HIGH SENSITIVITY) - Abnormal; Notable for the following components:   Troponin I (High Sensitivity) 300 (*)    All other components within normal limits  RESP PANEL BY RT-PCR (FLU A&B, COVID) ARPGX2  CBC  PROTIME-INR  APTT  HEPARIN LEVEL (UNFRACTIONATED)  HIV ANTIBODY (ROUTINE TESTING W REFLEX)  LIPID PANEL  BASIC METABOLIC PANEL  CBC  POC URINE PREG, ED  TROPONIN I (HIGH SENSITIVITY)   ____________________________________________  12 Lead EKG  Sinus rhythm, rate of 84 bpm.  Normal axis and intervals.  No evidence of acute ischemia. ____________________________________________  RADIOLOGY  ED MD interpretation:  CXR reviewed by me without evidence of acute cardiopulmonary pathology.  Official radiology report(s): DG Chest 2 View  Result Date: 07/14/2021 CLINICAL DATA:  Acute chest pain. EXAM: CHEST - 2 VIEW COMPARISON:   02/23/2017 and prior studies FINDINGS: The cardiomediastinal silhouette is unremarkable. Mild LEFT basilar scarring again noted. There is no evidence of focal airspace disease, pulmonary edema, suspicious pulmonary nodule/mass, pleural effusion, or pneumothorax. No acute bony abnormalities are identified. IMPRESSION: No active cardiopulmonary disease. Electronically Signed   By: Margarette Canada M.D.   On: 07/14/2021 12:19   CT Angio Chest PE W and/or Wo Contrast  Result Date: 07/14/2021 CLINICAL DATA:  Left-sided chest pain. EXAM: CT ANGIOGRAPHY CHEST WITH CONTRAST TECHNIQUE: Multidetector CT imaging of the chest was performed using the standard protocol during bolus administration of  intravenous contrast. Multiplanar CT image reconstructions and MIPs were obtained to evaluate the vascular anatomy. CONTRAST:  43mL OMNIPAQUE IOHEXOL 350 MG/ML SOLN COMPARISON:  August 09, 2011 FINDINGS: Cardiovascular: Satisfactory opacification of the pulmonary arteries to the segmental level. No evidence of pulmonary embolism. Normal heart size. No pericardial effusion. Mediastinum/Nodes: No enlarged mediastinal, hilar, or axillary lymph nodes. Thyroid gland, trachea, and esophagus demonstrate no significant findings. Lungs/Pleura: A 4 mm noncalcified lung nodule is seen within the posterior aspect of the right lower lobe (axial CT image 61, CT series 6). This represents a new finding when compared to the prior study. There is no evidence of acute infiltrate, pleural effusion or pneumothorax. Upper Abdomen: Surgical clips are seen within the gallbladder fossa. Surgical sutures are noted within the gastric region. Musculoskeletal: No chest wall abnormality. No acute or significant osseous findings. Review of the MIP images confirms the above findings. IMPRESSION: 1. No CT evidence of pulmonary embolism or other acute intrathoracic process. 2. 4 mm noncalcified lung nodule within the posterior aspect of the right lower lobe. No  follow-up needed if patient is low-risk. Non-contrast chest CT can be considered in 12 months if patient is high-risk. This recommendation follows the consensus statement: Guidelines for Management of Incidental Pulmonary Nodules Detected on CT Images: From the Fleischner Society 2017; Radiology 2017; 284:228-243. 3. Evidence of prior cholecystectomy and gastric surgery. Electronically Signed   By: Virgina Norfolk M.D.   On: 07/14/2021 19:47    ____________________________________________   PROCEDURES and INTERVENTIONS  Procedure(s) performed (including Critical Care):  .1-3 Lead EKG Interpretation Performed by: Vladimir Crofts, MD Authorized by: Vladimir Crofts, MD     Interpretation: normal     ECG rate:  80   ECG rate assessment: normal     Rhythm: sinus rhythm     Ectopy: none     Conduction: normal   .Critical Care Performed by: Vladimir Crofts, MD Authorized by: Vladimir Crofts, MD   Critical care provider statement:    Critical care time (minutes):  30   Critical care time was exclusive of:  Separately billable procedures and treating other patients   Critical care was necessary to treat or prevent imminent or life-threatening deterioration of the following conditions:  Cardiac failure   Critical care was time spent personally by me on the following activities:  Ordering and performing treatments and interventions, ordering and review of laboratory studies, ordering and review of radiographic studies, re-evaluation of patient's condition, pulse oximetry, review of old charts, evaluation of patient's response to treatment and examination of patient  Medications  nitroGLYCERIN (NITROSTAT) SL tablet 0.4 mg (0.4 mg Sublingual Given 07/14/21 2000)  heparin ADULT infusion 100 units/mL (25000 units/270mL) (1,100 Units/hr Intravenous New Bag/Given 07/14/21 2003)  aspirin EC tablet 81 mg (has no administration in time range)  acetaminophen (TYLENOL) tablet 650 mg (has no administration in time  range)  atorvastatin (LIPITOR) tablet 40 mg (has no administration in time range)  ALPRAZolam (XANAX) tablet 0.25 mg (has no administration in time range)  ondansetron (ZOFRAN) injection 4 mg (has no administration in time range)  acetaminophen (TYLENOL) tablet 650 mg (650 mg Oral Given 07/14/21 1458)  aspirin chewable tablet 243 mg (243 mg Oral Given 07/14/21 1714)  heparin injection 4,000 Units (4,000 Units Intravenous Given 07/14/21 1723)  midazolam (VERSED) injection 2 mg (2 mg Intravenous Given 07/14/21 2007)  iohexol (OMNIPAQUE) 350 MG/ML injection 75 mL (75 mLs Intravenous Contrast Given 07/14/21 1935)    ____________________________________________   MDM / ED  COURSE   60 year old woman with history of a clotting disorder presents to the ED with chest pain and rising troponins concerning for NSTEMI.  Continued chest pain here in the ED that we will treat with aspirin, nitroglycerin and heparin.  EKG is nonischemic, but troponins are rising from negative to 163.  CXR unremarkable.  We will send a D-dimer to screen for PE.  We will initiate anticoagulation and anticipate medical admission. CTA unremarkable. Troponins uptrending. Symptoms improving with NTG. Admitted to medicine  Clinical Course as of 07/14/21 2115  Sat Jul 14, 2021  1800 Reassessed.  Improving pain after nitroglycerin.  She still quite anxious. [DS]    Clinical Course User Index [DS] Vladimir Crofts, MD    ____________________________________________   FINAL CLINICAL IMPRESSION(S) / ED DIAGNOSES  Final diagnoses:  NSTEMI (non-ST elevated myocardial infarction) [facility])  Other chest pain     ED Discharge Orders     None        Jarquis Walker   Note:  This document was prepared using Dragon voice recognition software and may include unintentional dictation errors.    Vladimir Crofts, MD 07/14/21 2116

## 2021-07-14 NOTE — H&P (Addendum)
History and Physical    Michelle Armstrong GYK:[id number] DOB: 10-25-1960 DOA: 07/14/2021  PCP: Leone Haven, MD   Patient coming from: home  I have personally briefly reviewed patient's old medical records in [place]  Chief Complaint: chest pain  HPI: Michelle Armstrong is a 60 y.o. 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  The episode that prompted the visit today was more severe, located on the left side of her chest radiating to the left arm and back associated with numbness bilateral hands and shortness of breath, lasting several hours.  Onset was while sitting, unrelated to exertion and maximum intensity 10 associated  with nausea and feeling like she was going to throw up. Patient otherwise denies any other symptoms and was previously in her usual state of health except for being in boot on the left leg following a mechanical fall 2 months prior which patient says resulted in "2 fibular fractures in my left leg (at my ankle and right below my knee), a grade 2 left ankle sprain and L1 compression fracture".   She denies cough fever or chills and denies abdominal pain or diarrhea  ED course: Vitals have been within normal limits Blood work: Troponin <2-->163-->249 with D-dimer 0.58 Labs otherwise unremarkable  EKG, personally viewed and interpreted: NSR at 84 with no acute ST-T wave changes  Imaging CT angio of the chest negative for PE Nonacute findings include 4 mm lung nodule posterior aspect right lower lobe with  recommendation for follow-up noncontrast CT chest in 12 months if high risk  Patient was given nitroglycerin x3 in the ED with resolution of her chest pain.  Also given aspirin 325 and started on a heparin infusion.  Hospitalist consulted for admission for NSTEMI.  Review of Systems: As per HPI otherwise all other systems on review of systems negative.    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 02/2010 Echo: EF 55-60%, no rwma.   Vitamin D deficiency    low    Past Surgical History:  Procedure Laterality Date   ABDOMINAL HYSTERECTOMY  1999   Total lap hyst BSO due to endometriosis   CARDIAC ELECTROPHYSIOLOGY MAPPING AND ABLATION  2011   COLONOSCOPY WITH PROPOFOL N/A 07/10/2015   Procedure: COLONOSCOPY WITH PROPOFOL;  Surgeon: Lollie Sails, MD;  Location: [facility] ENDOSCOPY;  Service: Endoscopy;  Laterality: N/A;   GALLBLADDER SURGERY     HERNIA REPAIR  12/2010   LEFT HEART CATH AND CORONARY ANGIOGRAPHY N/A 02/24/2017   Procedure: Left Heart Cath and Coronary Angiography;  Surgeon:  Wellington Hampshire, MD;  Location: [place] CV LAB;  Service: Cardiovascular;  Laterality: N/A;   TONSILLECTOMY       reports that she has never smoked. She has never used smokeless tobacco. She reports current alcohol use. She reports that she does not use drugs.  Allergies  Allergen Reactions   Cephalexin     Rash & dizziness   Latex     rash   Metoprolol     REACTION: dizziness, slurred speech   Nsaids Other (See Comments)    ulcers ulcers   Penicillin G Itching   Pollen Extract     Family  History  Problem Relation Age of Onset   Heart disease Father        s/p CABG   Obesity Father    Hyperlipidemia Father    Bladder Cancer Father    Drug abuse Father    Thyroid disease Sister    Clotting disorder Sister        gene mutation   Breast cancer Neg Hx       Prior to Admission medications   Medication Sig Start Date End Date Taking? Authorizing Provider  Acetaminophen (TYLENOL PO) Take by mouth.    [provider]  Ascorbic Acid (VITAMIN C PO) Take by mouth.    [provider]  aspirin EC 81 MG tablet Take 81 mg by mouth daily. Swallow whole.    [provider]  azithromycin (ZITHROMAX) 500 MG tablet Take 1 tablet (500 mg total) by mouth daily. Patient not taking: Reported on 06/08/2021 04/20/21   Crecencio Mc, MD  benzonatate (TESSALON) 200 MG capsule Take 1 capsule (200 mg total) by mouth 3 (three) times daily as needed for cough. Patient not taking: Reported on 06/08/2021 04/20/21   Crecencio Mc, MD  Calcium Carbonate 500 MG CHEW Chew 1 tablet by mouth daily.    [provider]  cholecalciferol (VITAMIN D) 1000 units tablet Take by mouth daily.    [provider]  Ferrous Sulfate (IRON PO) Take by mouth.    [provider]  Multiple Vitamins-Minerals (MULTIVITAMIN PO) Take 1 tablet by mouth daily.    [provider]  nitrofurantoin, macrocrystal-monohydrate, (MACROBID) 100 MG capsule Take 1 capsule (100 mg total) by mouth 2 (two) times daily. 06/25/21   Leone Haven, MD  Thiamine HCl (VITAMIN B-1 PO) Take by mouth.    [provider]  tiZANidine (ZANAFLEX) 4 MG tablet Take 1 tablet (4 mg total) by mouth every 6 (six) hours as needed for muscle spasms. Patient not taking: Reported on 06/08/2021 04/20/21   Crecencio Mc, MD  traMADol (ULTRAM) 50 MG tablet Take 1 tablet (50 mg total) by mouth every 8 (eight) hours as needed. 07/03/21   Leone Haven, MD    Physical Exam: Vitals:    07/14/21 1700 07/14/21 1730 07/14/21 1800 07/14/21 2015  BP: 140/87 (!) 129/91 121/76 98/70  Pulse: 79 88 77 82  Resp: 20 16 15 11   Temp:      TempSrc:      SpO2: 99% 98% 98% 96%  Weight:      Height:         Vitals:   07/14/21 1700 07/14/21 1730 07/14/21 1800 07/14/21 2015  BP: 140/87 (!) 129/91 121/76 98/70  Pulse: 79 88 77 82  Resp: 20 16 15 11   Temp:      TempSrc:      SpO2: 99% 98% 98% 96%  Weight:      Height:          Constitutional: Alert and oriented x 3 . Not in any apparent distress HEENT:      Head: Normocephalic and atraumatic.         Eyes: PERLA, EOMI, Conjunctivae are normal. Sclera is non-icteric.       Mouth/Throat: Mucous membranes are moist.       Neck: Supple with no signs of meningismus. Cardiovascular: Regular rate and rhythm. No murmurs, gallops, or rubs. 2+ symmetrical distal pulses are present . No JVD. No LE edema Respiratory: Respiratory effort normal .Lungs sounds clear bilaterally. No wheezes, crackles, or rhonchi.  Gastrointestinal: Soft, non tender, and non distended with positive bowel sounds.  Genitourinary: No CVA tenderness. Musculoskeletal: Nontender with normal range of motion in all extremities.  Left foot in boot. No cyanosis, or erythema of extremities. Neurologic:  Face is symmetric. Moving all extremities. No gross focal neurologic deficits . Skin: Skin is warm, dry.  No rash or ulcers Psychiatric: Mood and affect are normal    Labs on Admission: I have personally reviewed following labs and imaging studies  CBC: Recent Labs  Lab 07/14/21 1141  WBC 6.6  HGB 14.4  HCT 41.3  MCV 88.8  PLT 440   Basic Metabolic Panel: Recent Labs  Lab 07/14/21 1141  NA 139  K 3.6  CL 103  CO2 25  GLUCOSE 110*  BUN 28*  CREATININE 0.63  CALCIUM 9.8   GFR: Estimated Creatinine Clearance: 80.7 mL/min (by C-G formula based on SCr of 0.63 mg/dL). Liver Function Tests: No results for input(s): AST, ALT, ALKPHOS, BILITOT, PROT,  ALBUMIN in the last 168 hours. No results for input(s): LIPASE, AMYLASE in the last 168 hours. No results for input(s): AMMONIA in the last 168 hours. Coagulation Profile: Recent Labs  Lab 07/14/21 1725  INR 1.0   Cardiac Enzymes: No results for input(s): CKTOTAL, CKMB, CKMBINDEX, TROPONINI in the last 168 hours. BNP (last 3 results) No results for input(s): PROBNP in the last 8760 hours. HbA1C: No results for input(s): HGBA1C in the last 72 hours. CBG: No results for input(s): GLUCAP in the last 168 hours. Lipid Profile: No results for input(s): CHOL, HDL, LDLCALC, TRIG, CHOLHDL, LDLDIRECT in the last 72 hours. Thyroid Function Tests: No results for input(s): TSH, T4TOTAL, FREET4, T3FREE, THYROIDAB in the last 72 hours. Anemia Panel: No results for input(s): VITAMINB12, FOLATE, FERRITIN, TIBC, IRON, RETICCTPCT in the last 72 hours. Urine analysis: No results found for: COLORURINE, APPEARANCEUR, [place], [place], GLUCOSEU, [place], BILIRUBINUR, KETONESUR, PROTEINUR, UROBILINOGEN, NITRITE, LEUKOCYTESUR  Radiological Exams on Admission: DG Chest 2 View  Result Date: 07/14/2021 CLINICAL DATA:  Acute chest pain. EXAM: CHEST - 2 VIEW COMPARISON:  02/23/2017 and prior studies FINDINGS: The cardiomediastinal silhouette is unremarkable. Mild LEFT basilar scarring again noted. There is no evidence of focal airspace disease, pulmonary edema, suspicious pulmonary nodule/mass, pleural effusion, or pneumothorax. No acute bony abnormalities are identified. IMPRESSION: No active cardiopulmonary disease. Electronically Signed   By: Margarette Canada M.D.   On: 07/14/2021 12:19   CT Angio Chest PE W and/or Wo Contrast  Result Date: 07/14/2021 CLINICAL DATA:  Left-sided chest pain. EXAM: CT ANGIOGRAPHY CHEST WITH CONTRAST TECHNIQUE: Multidetector CT imaging of the chest was performed using the standard protocol during bolus administration of intravenous contrast. Multiplanar CT image reconstructions and  MIPs were obtained to evaluate the vascular anatomy. CONTRAST:  40mL OMNIPAQUE IOHEXOL 350 MG/ML SOLN COMPARISON:  August 09, 2011 FINDINGS: Cardiovascular: Satisfactory opacification of the pulmonary arteries to the segmental level. No evidence of pulmonary embolism. Normal heart size. No pericardial effusion. Mediastinum/Nodes: No enlarged mediastinal, hilar, or axillary lymph nodes. Thyroid gland, trachea, and esophagus demonstrate no significant findings. Lungs/Pleura: A 4 mm noncalcified lung nodule is seen within the posterior aspect of the right lower lobe (axial CT image 61, CT series 6). This represents a new finding when compared to the prior study. There is no evidence of acute infiltrate, pleural effusion or pneumothorax. Upper Abdomen: Surgical clips are seen within the gallbladder fossa. Surgical sutures are noted within the gastric region. Musculoskeletal: No chest wall abnormality. No acute or significant osseous findings. Review of the MIP images confirms the above findings. IMPRESSION: 1. No CT evidence of pulmonary embolism or other acute intrathoracic process. 2. 4 mm noncalcified lung nodule within the posterior aspect of the right lower lobe. No follow-up needed if patient is low-risk. Non-contrast chest CT can be considered in 12 months if patient is high-risk. This recommendation follows the consensus statement: Guidelines for Management of Incidental Pulmonary Nodules Detected on CT Images: From the Fleischner Society 2017; Radiology 2017; 284:228-243. 3. Evidence of prior cholecystectomy and gastric surgery. Electronically Signed   By: Virgina Norfolk M.D.   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 for possible procedure in the a.m.    History of SVT s/p ablation - No acute issues suspected - Cardiac monitoring    Pulmonary nodule less than 6 mm determined by computed tomography of lung - "No follow-up needed if patient is low-risk. 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  Has no follow-up with hematology -CTA chest negative for acute PE  History of left fibula fracture 9/1 -S/p cast removal of a left SLC fiber glass on 10/14, now and now left Cam Boot  - Continue Ortho outpatient follow-up    DVT prophylaxis: Heparin infusion Code Status: full code  Family Communication:  husband at bedside  Disposition Plan: Back to previous home environment Consults called: cardiology Status:At the time of admission, it appears that the appropriate admission status for this patient is INPATIENT. This is judged to be reasonable and necessary in order to provide the required intensity of service to ensure the patient's safety given the presenting symptoms, physical exam findings, and initial radiographic and laboratory data in the context of their  Comorbid conditions.   Patient requires inpatient status due to high intensity of service, high risk for further deterioration and high frequency of surveillance required.   I certify that at the point of admission it is my clinical judgment that the patient will require inpatient hospital care spanning beyond [place] MD [facility]     07/14/2021, 8:29 PM

## 2021-07-14 NOTE — Consult Note (Signed)
ANTICOAGULATION CONSULT NOTE - Initial Consult  Pharmacy Consult for Heparin gtt Indication: chest pain/ACS  Allergies  Allergen Reactions   Cephalexin     Rash & dizziness   Latex     rash   Metoprolol     REACTION: dizziness, slurred speech   Nsaids Other (See Comments)    ulcers ulcers   Penicillin G Itching   Pollen Extract     Patient Measurements: Height: 5\' 7"  (170.2 cm) Weight: 74.4 kg (164 lb) IBW/kg (Calculated) : 61.6 Heparin Dosing Weight: 74.4kg   Vital Signs: Temp: 98.1 F (36.7 C) (10/29 1632) Temp Source: Oral (10/29 1632) BP: 115/73 (10/29 1632) Pulse Rate: 79 (10/29 1632)  Labs: Recent Labs    07/14/21 1141 07/14/21 1457  HGB 14.4  --   HCT 41.3  --   PLT 308  --   CREATININE 0.63  --   TROPONINIHS <2 163*    Estimated Creatinine Clearance: 80.7 mL/min (by C-G formula based on SCr of 0.63 mg/dL).   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 02/2010 Echo: EF 55-60%, no rwma.   Vitamin D deficiency    low    Assessment: Patient with unclear hx of clotting disorder (no personal history of DVT/PE), HLD, liver hemangioma, SVT, and NSTEMI in 2018.  Pharmacy consulted for to manage heparin drip. Heparin bolus given by MD orders.  Baseline labs aPTT 25 INR 1.0 Plt 308 H/H = 14.4/41.3  Goal of Therapy:  Heparin level 0.3-0.7 units/ml Monitor platelets by anticoagulation protocol: Yes   Plan:  Give 4000 units bolus, given by MD prior to placing consult. Start heparin infusion at 1100 units/hr Check anti-Xa level in 6 hours  and daily while on heparin Continue to monitor H&H and platelets  Michelle Armstrong PharmD, BCPS 07/14/2021 6:07 PM

## 2021-07-15 ENCOUNTER — Inpatient Hospital Stay (HOSPITAL_COMMUNITY)
Admit: 2021-07-15 | Discharge: 2021-07-15 | Disposition: A | Discharge status: Home / Self Care | Payer: Managed Care, Other (non HMO) | Attending: Physician Assistant | Admitting: Physician Assistant

## 2021-07-15 DIAGNOSIS — R079 Chest pain, unspecified: Secondary | ICD-10-CM

## 2021-07-15 DIAGNOSIS — I214 Non-ST elevation (NSTEMI) myocardial infarction: Secondary | ICD-10-CM | POA: Diagnosis not present

## 2021-07-15 DIAGNOSIS — I471 Supraventricular tachycardia: Secondary | ICD-10-CM | POA: Diagnosis not present

## 2021-07-15 DIAGNOSIS — I2510 Atherosclerotic heart disease of native coronary artery without angina pectoris: Secondary | ICD-10-CM | POA: Diagnosis not present

## 2021-07-15 LAB — HEPARIN LEVEL (UNFRACTIONATED)
Heparin Unfractionated: 0.54 IU/mL (ref 0.30–0.70)
Heparin Unfractionated: 0.7 IU/mL (ref 0.30–0.70)

## 2021-07-15 LAB — CBC
HCT: 37.9 % (ref 36.0–46.0)
Hemoglobin: 13 g/dL (ref 12.0–15.0)
MCH: 30.5 pg (ref 26.0–34.0)
MCHC: 34.3 g/dL (ref 30.0–36.0)
MCV: 89 fL (ref 80.0–100.0)
Platelets: 268 10*3/uL (ref 150–400)
RBC: 4.26 MIL/uL (ref 3.87–5.11)
RDW: 14 % (ref 11.5–15.5)
WBC: 6.7 10*3/uL (ref 4.0–10.5)
nRBC: 0 % (ref 0.0–0.2)

## 2021-07-15 LAB — BASIC METABOLIC PANEL
Anion gap: 8 (ref 5–15)
BUN: 21 mg/dL — ABNORMAL HIGH (ref 6–20)
CO2: 25 mmol/L (ref 22–32)
Calcium: 9 mg/dL (ref 8.9–10.3)
Chloride: 104 mmol/L (ref 98–111)
Creatinine, Ser: 0.66 mg/dL (ref 0.44–1.00)
GFR, Estimated: >60 mL/min (ref >60)
Glucose, Bld: 87 mg/dL (ref 70–99)
Potassium: 4.2 mmol/L (ref 3.5–5.1)
Sodium: 137 mmol/L (ref 135–145)

## 2021-07-15 LAB — ECHOCARDIOGRAM COMPLETE
AR max vel: 1.86 cm2
AV Area VTI: 1.64 cm2
AV Area mean vel: 1.67 cm2
AV Mean grad: 5 mmHg
AV Peak grad: 7.5 mmHg
Ao pk vel: 1.37 m/s
Area-P 1/2: 6.29 cm2
Height: 67 in
S' Lateral: 2.2 cm
Weight: 2624 oz

## 2021-07-15 LAB — LIPID PANEL
Cholesterol: 226 mg/dL — ABNORMAL HIGH (ref 0–200)
HDL: 92 mg/dL (ref >40)
LDL Cholesterol: 121 mg/dL — ABNORMAL HIGH (ref 0–99)
Total CHOL/HDL Ratio: 2.5 RATIO
Triglycerides: 67 mg/dL (ref <150)
VLDL: 13 mg/dL (ref 0–40)

## 2021-07-15 LAB — HIV ANTIBODY (ROUTINE TESTING W REFLEX): HIV Screen 4th Generation wRfx: NONREACTIVE

## 2021-07-15 LAB — TSH: TSH: 3.992 u[IU]/mL (ref 0.350–4.500)

## 2021-07-15 LAB — TROPONIN I (HIGH SENSITIVITY): Troponin I (High Sensitivity): 279 ng/L (ref <18)

## 2021-07-15 NOTE — ED Notes (Signed)
Pt reports headache, 5/10, medicated with tylenol as ordered.

## 2021-07-15 NOTE — Consult Note (Signed)
ANTICOAGULATION CONSULT NOTE - Initial Consult  Pharmacy Consult for Heparin gtt Indication: chest pain/ACS  Allergies  Allergen Reactions   Cephalexin     Rash & dizziness   Latex     rash   Metoprolol     REACTION: dizziness, slurred speech   Nsaids Other (See Comments)    ulcers ulcers   Penicillin G Itching   Pollen Extract     Patient Measurements: Height: 5\' 7"  (170.2 cm) Weight: 74.4 kg (164 lb) IBW/kg (Calculated) : 61.6 Heparin Dosing Weight: 74.4kg   Vital Signs: Temp: 98.1 F (36.7 C) (10/29 1632) Temp Source: Oral (10/29 1632) BP: 106/65 (10/29 2030) Pulse Rate: 75 (10/30 0000)  Labs: Recent Labs    07/14/21 1141 07/14/21 1457 07/14/21 1725 07/14/21 1816 07/14/21 1956 07/15/21 0116  HGB 14.4  --   --   --   --   --   HCT 41.3  --   --   --   --   --   PLT 308  --   --   --   --   --   APTT  --   --  25  --   --   --   LABPROT  --   --  13.4  --   --   --   INR  --   --  1.0  --   --   --   HEPARINUNFRC  --   --   --   --   --  0.54  CREATININE 0.63  --   --   --   --   --   TROPONINIHS <2 163*  --  249* 300*  --      Estimated Creatinine Clearance: 80.7 mL/min (by C-G formula based on SCr of 0.63 mg/dL).   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 02/2010 Echo: EF 55-60%, no rwma.   Vitamin D deficiency    low    Assessment: Patient with unclear hx of clotting disorder (no personal history of DVT/PE), HLD, liver hemangioma, SVT, and NSTEMI in 2018.  Pharmacy consulted for to manage heparin drip. Heparin bolus given by MD  orders.  Baseline labs aPTT 25 INR 1.0 Plt 308 H/H = 14.4/41.3  Goal of Therapy:  Heparin level 0.3-0.7 units/ml Monitor platelets by anticoagulation protocol: Yes   Plan:  10/30: HL @ 0116 = 0.54, therapeutic X 1  Will continue pt on current rate and recheck HL in 6 hrs on 10/30 @ 0700.   Ki Luckman D 07/15/2021 1:57 AM

## 2021-07-15 NOTE — ED Notes (Signed)
Blue top sent to lab for heparin level.

## 2021-07-15 NOTE — Consult Note (Signed)
Cardiology Consultation:   Patient ID: MARCEDES TECH; [id number]; Mar 26, 1961   Admit date: 07/14/2021 Date of Consult: 07/15/2021  Primary Care Provider: Leone Haven, MD Primary Cardiologist: New to [facility] - consult by Oval Linsey (last seen in the office in 2016) Primary Electrophysiologist:  previously Caryl Comes   Patient Profile:   KEUNDRA PETRUCELLI is a 60 y.o. female with a hx of normal coronary arteries by LHC in 02/2017 with possible endothelial dysfunction, SVT s/p ablation in 2011 with recurrence of palpitations severals years later, HLD, reported clotting disorder with further details unclear, cervical radiculopathy, mechanical fall, lumbar compression fracture and obesity s/p gastric bypass surgery who is being seen today for the evaluation of elevated high sensitivity troponin at the request of Dr. Dwyane Dee.  History of Present Illness:   Ms. Mcilvaine has history of SVT s/p ablation in 2011.  She redeveloped intermittent palpitations in 2015 and was medically managed with recommendation to monitor. She was admitted in 02/2017 with chest pain and mild troponin elevation peaking at 0.67. She underwent echo that showed an EF of 55-60%, normal wall motion, normal LV diastolic function parameters, and trivial mitral regurgitation. LHC showed normal coronary arteries without evidence of obstructive CAD. There was mildly sluggish flow in the left coronary tree, particularly the LAD, normal LVEDP. There was concern for possibly endothelial dysfunction.   She was recently prescribed a short course of steroids.  While taking these, she held her aspirin.  On 10/28, she developed sudden onset of left-sided chest discomfort with associated tachypalpitations.  Symptoms lasted for approximately 5 to 10 minutes followed by spontaneous resolution.  On 10/29, she again developed left-sided chest discomfort, though this time it was significantly more intense and not associated with  palpitations.  There was associated generalized malaise and fatigue with this.  Pain was rated 10 out of 10.  Given symptoms, she presented to [facility].  She continued to note severe chest discomfort while waiting in the ED.  Vitals stable. EKG showed NSR, 84 bpm, no acute st/t changes. Telemetry demonstrated sinus rhythm with sinus tachycardia into the 120s bpm. High sensitivity troponin < 2 with a delta troponin of 163, currently trended to 300. D dimer mildly elevated with CTA chest negative for PE. CXR without acute cardiopulmonary process. In the ED, she was given ASA, SL NTG x 2 with noted improvement in chest pain, and placed on a heparin gtt. Currently, chest pain-free.     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 02/2010 Echo: EF 55-60%, no rwma.   Vitamin D deficiency    low    Past Surgical History:  Procedure Laterality Date   ABDOMINAL HYSTERECTOMY  1999   Total lap hyst BSO due to endometriosis   CARDIAC ELECTROPHYSIOLOGY MAPPING AND ABLATION  2011   COLONOSCOPY WITH PROPOFOL N/A 07/10/2015   Procedure: COLONOSCOPY WITH PROPOFOL;  Surgeon: Lollie Sails, MD;  Location: [facility] ENDOSCOPY;  Service: Endoscopy;  Laterality: N/A;   GALLBLADDER  SURGERY     HERNIA REPAIR  12/2010   LEFT HEART CATH AND CORONARY ANGIOGRAPHY N/A 02/24/2017   Procedure: Left Heart Cath and Coronary Angiography;  Surgeon: Wellington Hampshire, MD;  Location: [place] CV LAB;  Service: Cardiovascular;  Laterality: N/A;   TONSILLECTOMY       Home Meds: Prior to Admission medications   Medication Sig Start Date  End Date Taking? Authorizing Provider  aspirin EC 81 MG tablet Take 81 mg by mouth daily.   Yes [provider]  B Complex-C-Folic Acid (SUPER B COMPLEX/FA/VIT C) TABS Take 1 tablet by mouth daily.   Yes [provider]  Calcium Carbonate 500 MG CHEW Chew 1 tablet by mouth daily.   Yes [provider]  cholecalciferol (VITAMIN D) 1000 units tablet Take 1,000 Units by mouth daily.   Yes [provider]  Collagen Hydrolysate POWD 2 Scoops as directed.   Yes [provider]  Multiple Vitamins-Minerals (MULTIVITAMIN PO) Take 1 tablet by mouth daily.   Yes [provider]  psyllium (HYDROCIL/METAMUCIL) 95 % PACK Take 1 packet by mouth daily as needed for mild constipation.   Yes [provider]  tiZANidine (ZANAFLEX) 4 MG tablet Take 1 tablet (4 mg total) by mouth every 6 (six) hours as needed for muscle spasms. 04/20/21  Yes Crecencio Mc, MD  azithromycin (ZITHROMAX) 500 MG tablet Take 1 tablet (500 mg total) by mouth daily. Patient not taking: No sig reported 04/20/21   Crecencio Mc, MD  benzonatate (TESSALON) 200 MG capsule Take 1 capsule (200 mg total) by mouth 3 (three) times daily as needed for cough. Patient not taking: No sig reported 04/20/21   Crecencio Mc, MD  nitrofurantoin, macrocrystal-monohydrate, (MACROBID) 100 MG capsule Take 1 capsule (100 mg total) by mouth 2 (two) times daily. Patient not taking: No sig reported 06/25/21   Leone Haven, MD  traMADol (ULTRAM) 50 MG tablet Take 1 tablet (50 mg total) by mouth every 8 (eight) hours as needed. Patient not taking: No sig reported 07/03/21   Leone Haven, MD    Inpatient Medications: Scheduled Meds:  aspirin EC  81 mg Oral Daily   atorvastatin  40 mg Oral Daily   Continuous Infusions:  heparin 1,100 Units/hr (07/15/21 0527)   PRN Meds: acetaminophen, ALPRAZolam, nitroGLYCERIN, ondansetron (ZOFRAN) IV  Allergies:   Allergies  Allergen Reactions    Cephalexin     Rash & dizziness   Latex     rash   Metoprolol     REACTION: dizziness, slurred speech   Nsaids Other (See Comments)    ulcers ulcers   Penicillin G Itching   Pollen Extract     Social History:   Social History   Socioeconomic History   Marital status: Married    Spouse name: Not on file   Number of children: 1   Years of education: Not on file   Highest education level: Not on file  Occupational History   Occupation: customer service rep    Employer: [employer]  Tobacco Use   Smoking status: Never   Smokeless tobacco: Never   Tobacco comments:    tobacco use- no   Vaping Use   Vaping Use: Never used  Substance and Sexual Activity   Alcohol use: Yes    Alcohol/week: 0.0 standard drinks    Comment: rare glass of wine.   Drug use: No   Sexual activity: Yes    Birth control/protection: Post-menopausal  Other Topics Concern   Not on file  Social History Narrative   Lives locally with husband.  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   Social Determinants of Health   Financial Resource Strain: Not on file  Food Insecurity: Not on file  Transportation Needs: Not on file  Physical Activity: Not on file  Stress: Not on file  Social Connections: Not on file  Intimate Partner Violence: Not on file     Family History:   Family History  Problem Relation Age of Onset   Heart disease Father        s/p CABG   Obesity Father    Hyperlipidemia Father    Bladder Cancer Father    Drug abuse Father    Thyroid disease Sister    Clotting disorder Sister        gene mutation   Breast cancer Neg Hx     ROS:  Review of Systems  Constitutional:  Positive for malaise/fatigue. Negative for chills, diaphoresis, fever and weight loss.  HENT:  Negative for congestion.   Eyes:  Negative for discharge and redness.  Respiratory:  Negative for cough, sputum production, shortness of breath and wheezing.   Cardiovascular:  Positive for chest pain and  palpitations. Negative for orthopnea, claudication, leg swelling and PND.  Gastrointestinal:  Negative for abdominal pain, heartburn, nausea and vomiting.  Musculoskeletal:  Positive for back pain and falls. Negative for myalgias.  Skin:  Negative for rash.  Neurological:  Negative for dizziness, tingling, tremors, sensory change, speech change, focal weakness, loss of consciousness and weakness.  Endo/Heme/Allergies:  Does not bruise/bleed easily.  Psychiatric/Behavioral:  Negative for substance abuse. The patient is not nervous/anxious.   All other systems reviewed and are negative.    Physical Exam/Data:   Vitals:   07/15/21 0000 07/15/21 0100 07/15/21 0200 07/15/21 0533  BP:    115/69  Pulse: 75 72 70 85  Resp: 16 14 14 16   Temp:    98.1 F (36.7 C)  TempSrc:    Oral  SpO2: 96% 95% 95% 97%  Weight:      Height:        Intake/Output Summary (Last 24 hours) at 07/15/2021 0947 Last data filed at 07/15/2021 0527 Gross per 24 hour  Intake 103.3 ml  Output --  Net 103.3 ml   Filed Weights   07/14/21 1135  Weight: 74.4 kg   Body mass index is 25.69 kg/m.   Physical Exam: General: Well developed, well nourished, in no acute distress. Head: Normocephalic, atraumatic, sclera non-icteric, no xanthomas, nares without discharge.  Neck: Negative for carotid bruits. JVD not elevated. Lungs: Clear bilaterally to auscultation without wheezes, rales, or rhonchi. Breathing is unlabored. Heart: RRR with S1 S2. No murmurs, rubs, or gallops appreciated. Abdomen: Soft, non-tender, non-distended with normoactive bowel sounds. No hepatomegaly. No rebound/guarding. No obvious abdominal masses. Msk:  Strength and tone appear normal for age. Extremities: No clubbing or cyanosis. No edema.  Left lower extremity immobilized. Neuro: Alert and oriented X 3. No facial asymmetry. No focal deficit. Moves all extremities spontaneously. Psych:  Responds to questions appropriately with a normal  affect.   EKG:  The EKG was personally reviewed and demonstrates: NSR, 84 bpm, no acute st/t changes Telemetry:  Telemetry was personally reviewed and demonstrates: Sinus rhythm with sinus tachycardia into the 120s bpm  Weights: Filed Weights   07/14/21 1135  Weight: 74.4 kg    Relevant CV Studies:  2D echo 02/2017: -  Left ventricle: The cavity size was normal. Wall thickness was    normal. Systolic function was normal. The estimated ejection    fraction was in the range of 55% to 60%. Wall motion was normal;    there were no regional wall motion abnormalities. Left    ventricular diastolic function parameters were normal. __________  LHC 02/2017: 1. Normal coronary arteries with no evidence of obstructive coronary artery disease. Mildly sluggish flow in the left coronary system especially the LAD. 2. Normal LV systolic function by echo. Left ventricular angiography was not performed. Normal left ventricular end-diastolic pressure.   Recommendations: The patient might have endothelial dysfunction. Recommend medical therapy with aspirin and a statin. Given that some of her symptoms were pleuritic, I requested d-dimer and if elevated recommend CTA of the  Chest to evaluate for PE.  Laboratory Data:  Chemistry Recent Labs  Lab 07/14/21 1141 07/15/21 0528  NA 139 137  K 3.6 4.2  CL 103 104  CO2 25 25  GLUCOSE 110* 87  BUN 28* 21*  CREATININE 0.63 0.66  CALCIUM 9.8 9.0  GFRNONAA >60 >60  ANIONGAP 11 8    No results for input(s): PROT, ALBUMIN, AST, ALT, ALKPHOS, BILITOT in the last 168 hours. Hematology Recent Labs  Lab 07/14/21 1141 07/15/21 0528  WBC 6.6 6.7  RBC 4.65 4.26  HGB 14.4 13.0  HCT 41.3 37.9  MCV 88.8 89.0  MCH 31.0 30.5  MCHC 34.9 34.3  RDW 13.6 14.0  PLT 308 268   Cardiac EnzymesNo results for input(s): TROPONINI in the last 168 hours. No results for input(s): TROPIPOC in the last 168 hours.  BNPNo results for input(s): BNP, PROBNP in the last  168 hours.  DDimer  Recent Labs  Lab 07/14/21 1725  DDIMER 0.58*    Radiology/Studies:  DG Chest 2 View  Result Date: 07/14/2021 IMPRESSION: No active cardiopulmonary disease. Electronically Signed   By: Margarette Canada M.D.   On: 07/14/2021 12:19   CT Angio Chest PE W and/or Wo Contrast  Result Date: 07/14/2021 IMPRESSION: 1. No CT evidence of pulmonary embolism or other acute intrathoracic process. 2. 4 mm noncalcified lung nodule within the posterior aspect of the right lower lobe. No follow-up needed if patient is low-risk. Non-contrast chest CT can be considered in 12 months if patient is high-risk. This recommendation follows the consensus statement: Guidelines for Management of Incidental Pulmonary Nodules Detected on CT Images: From the Fleischner Society 2017; Radiology 2017; 284:228-243. 3. Evidence of prior cholecystectomy and gastric surgery. Electronically Signed   By: Virgina Norfolk M.D.   On: 07/14/2021 19:47    Assessment and Plan:   1. Chest pain with mildly elevated troponin: -Mildly elevated high sensitivity troponin  -Initial onset of symptoms associated with palpitations  -More severe episode of chest discomfort on 10/29, was not associated with tachypalpitations -Possibly supply demand ischemia with reported tachycardic rates and underlying endothelial dysfunction -No current indication for emergent cardiac cath at this time given resolution of symptoms and mild troponin elevation -LHC in 2018 showed normal coronary arteries  -She remains on heparin gtt at this time -Trend troponin to peak -Check echo -Monitor on telemetry -ASA -Atorvastatin has been ordered by primary service, though she preferred to defer statin therapy -Check TSH -Further recommendations regarding noninvasive versus invasive ischemic evaluation pending the above  2. SVT s/p ablation: -Cannot exclude the possibility of tachypalpitations recurring and contributing to her symptoms,  particularly with recent steroid use -Monitor on telemetry -If  no significant arrhythmias are noted on telemetry, recommend Zio patch at discharge -Per #1  Remaining noncardiac issues per primary service     For questions or updates, please contact [place] Please consult [email or web address] for contact info under Cardiology/STEMI.   Signed, Christell Faith, PA-C [place] Pager: [phone] 07/15/2021, 9:47 AM

## 2021-07-15 NOTE — ED Notes (Signed)
Pt refusing BP, as the cuff is painful. MD aware.

## 2021-07-15 NOTE — ED Notes (Signed)
Cardiology at bedside.

## 2021-07-15 NOTE — Consult Note (Signed)
ANTICOAGULATION CONSULT NOTE - Initial Consult  Pharmacy Consult for Heparin gtt Indication: chest pain/ACS  Allergies  Allergen Reactions   Cephalexin     Rash & dizziness   Latex     rash   Metoprolol     REACTION: dizziness, slurred speech   Nsaids Other (See Comments)    ulcers ulcers   Penicillin G Itching   Pollen Extract     Patient Measurements: Height: 5\' 7"  (170.2 cm) Weight: 74.4 kg (164 lb) IBW/kg (Calculated) : 61.6 Heparin Dosing Weight: 74.4kg   Vital Signs: Temp: 98.1 F (36.7 C) (10/30 0533) Temp Source: Oral (10/30 0533) BP: 115/69 (10/30 0533) Pulse Rate: 85 (10/30 0533)  Labs: Recent Labs    07/14/21 1141 07/14/21 1457 07/14/21 1725 07/14/21 1816 07/14/21 1956 07/15/21 0116 07/15/21 0528 07/15/21 0540  HGB 14.4  --   --   --   --   --  13.0  --   HCT 41.3  --   --   --   --   --  37.9  --   PLT 308  --   --   --   --   --  268  --   APTT  --   --  25  --   --   --   --   --   LABPROT  --   --  13.4  --   --   --   --   --   INR  --   --  1.0  --   --   --   --   --   HEPARINUNFRC  --   --   --   --   --  0.54  --  0.70  CREATININE 0.63  --   --   --   --   --  0.66  --   TROPONINIHS <2 163*  --  249* 300*  --   --   --      Estimated Creatinine Clearance: 80.7 mL/min (by C-G formula based on SCr of 0.66 mg/dL).   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 02/2010 Echo: EF 55-60%, no rwma.   Vitamin D deficiency    low    Assessment: Patient with unclear hx of clotting disorder (no personal history of DVT/PE), HLD,  liver hemangioma, SVT, and NSTEMI in 2018.  Pharmacy consulted for to manage heparin drip. Heparin bolus given by MD orders.  Baseline labs aPTT 25 INR 1.0 Plt 308 H/H = 14.4/41.3  Goal of Therapy:  Heparin level 0.3-0.7 units/ml Monitor platelets by anticoagulation protocol: Yes   Plan:  --10/30: HL @ 0540 = 0.70, therapeutic X 2  --Will continue heparin infusion at 1100 units/hr --Will recheck HL with AM labs  [facility] A Talishia Betzler 07/15/2021 9:20 AM

## 2021-07-15 NOTE — ED Notes (Signed)
Hospitalist Provider at bedside. 

## 2021-07-15 NOTE — ED Notes (Signed)
Pt taken to the [facility] at this time

## 2021-07-15 NOTE — ED Notes (Signed)
This RN explained to pt the need for BP monitoring & how the machine works, as well as how important it is to use the appropriate size BP cuff for accurate measurements. Pt states that when the BP cuff inflates, it is painful so she has her husband "rip it off". Info relayed to Dr. Damita Dunnings.

## 2021-07-15 NOTE — Progress Notes (Addendum)
[facility] Progress Note  Patient: Michelle Armstrong    DGL:[id number]  DOA: 07/14/2021     Date of Service: the patient was seen and examined on 07/15/2021  Chief Complaint  Patient presents with   Chest Pain   Brief hospital course: Taken from H&P on admission  Michelle Armstrong is a 60 y.o. 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  The episode that prompted the visit today was more severe, located on the left side of her chest radiating to the left arm and back associated with numbness bilateral hands and shortness of breath, lasting several hours.  Onset was while sitting, unrelated to exertion and maximum intensity 8 out of 10.  She had no associated nausea or vomiting or diaphoresis. Patient otherwise denies any other symptoms and was previously in her usual state of health except for being in boot on the left leg following a mechanical fall 2 months prior resulting in a tibial fracture that is being managed nonoperatively.  She denies cough fever or chills and denies abdominal pain or diarrhea   ED course: Vitals have been within normal limits Blood work: Troponin <2-->163-->249 with D-dimer 0.58 Labs otherwise unremarkable   EKG, personally viewed and interpreted: NSR at 84 with no acute ST-T wave changes   Imaging CT angio of the chest negative for PE Nonacute findings include 4 mm lung nodule posterior aspect right lower lobe with recommendation for follow-up noncontrast CT chest in 12 months if high risk   Patient was given  nitroglycerin x3 in the ED with resolution of her chest pain.  Also given aspirin 325 and started on a heparin infusion.  Hospitalist consulted for admission for NSTEMI.   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     Pulmonary nodule less than 6 mm determined by computed tomography of lung - "No follow-up needed if patient is low-risk. 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  Has no follow-up with hematology -CTA chest negative for acute PE  History of left fibula fracture 9/1 -S/p cast removal of a left SLC fiber glass on 10/14, now and now left Cam Boot  - Continue Ortho outpatient follow-up   Addendum done on patient's request (10/04/21) Coreg was discontinued as cardiology did not recommended but continued aspirin and Lipitor due to high cholesterol Patient wanted to include in her family history that her  sister and her son had VTE  Body mass index is 25.69 kg/m.  Interventions:       Diet: Heart healthy diet DVT Prophylaxis: Therapeutic Anticoagulation with heparin IV infusion    Advance goals of care discussion: Full code  Family Communication: family was present at bedside, at the time of  interview.  The pt provided permission to discuss medical plan with the family. Opportunity was given to ask question and all questions were answered satisfactorily.   Disposition:  Pt is from Home, admitted with chest pain, still on IV heparin infusion, which precludes a safe discharge. Discharge to home, when stable and cleared by cardiology..  Subjective: No significant overnight events, currently patient remains chest pain-free, denied any palpitations.  No any other active issues at this time, patient was resting comfortably.  Physical Exam: General:  alert oriented to time, place, and person.  Appear in no distress, affect appropriate Eyes: PERRLA ENT: Oral Mucosa Clear, moist  Neck: no JVD,  Cardiovascular: S1 and S2 Present, no Murmur,  Respiratory: good respiratory effort, Bilateral Air entry equal and Decreased, no Crackles, no wheezes Abdomen: Bowel Sound present, Soft and no tenderness,  Skin: no rashes Extremities: no Pedal edema, no calf tenderness Neurologic: without any new focal findings Gait not checked due to patient safety concerns  Vitals:   07/15/21 0533 07/15/21 1007 07/15/21 1300 07/15/21 1325  BP: 115/69 112/75  118/74  Pulse: 85 78 89 99  Resp: 16 16  16   Temp: 98.1 F (36.7 C)     TempSrc: Oral     SpO2: 97% 98% 96% 95%  Weight:      Height:        Intake/Output Summary (Last 24 hours) at 07/15/2021 1539 Last data filed at 07/15/2021 6195 Gross per 24 hour  Intake 103.3 ml  Output --  Net 103.3 ml   Filed Weights   07/14/21 1135  Weight: 74.4 kg    Data Reviewed: I have personally reviewed and interpreted daily labs, tele strips, imagings as discussed above. I reviewed all nursing notes, pharmacy notes, vitals, pertinent old records I have discussed plan of care as described above with RN and patient/family.  CBC: Recent Labs  Lab 07/14/21 1141 07/15/21 0528  WBC 6.6 6.7  HGB 14.4 13.0  HCT 41.3 37.9  MCV 88.8 89.0  PLT 308 093    Basic Metabolic Panel: Recent Labs  Lab 07/14/21 1141 07/15/21 0528  NA 139 137  K 3.6 4.2  CL 103 104  CO2 25 25  GLUCOSE 110* 87  BUN 28* 21*  CREATININE 0.63 0.66  CALCIUM 9.8 9.0    Studies: CT Angio Chest PE W and/or Wo Contrast  Result Date: 07/14/2021 CLINICAL DATA:  Left-sided chest pain. EXAM: CT ANGIOGRAPHY CHEST WITH CONTRAST TECHNIQUE: Multidetector CT imaging of the chest was performed using the standard protocol during bolus administration of intravenous contrast. Multiplanar CT image reconstructions and MIPs were obtained to evaluate the vascular anatomy. CONTRAST:  15mL OMNIPAQUE IOHEXOL 350 MG/ML SOLN COMPARISON:  August 09, 2011 FINDINGS: Cardiovascular: Satisfactory opacification of the pulmonary arteries to the segmental level. No evidence of pulmonary embolism. Normal heart size. No pericardial effusion. Mediastinum/Nodes: No enlarged mediastinal, hilar, or axillary lymph nodes. Thyroid gland, trachea, and esophagus demonstrate no significant findings. Lungs/Pleura: A 4 mm noncalcified lung nodule is seen within the posterior aspect of the right lower lobe (axial CT image 61, CT series 6). This represents a new finding when compared to the prior  study. There is no evidence of acute infiltrate, pleural effusion or pneumothorax. Upper Abdomen: Surgical clips are seen within the gallbladder fossa. Surgical sutures are noted within the gastric region. Musculoskeletal: No chest wall abnormality. No acute or significant osseous findings. Review of the MIP images confirms the above findings. IMPRESSION: 1. No CT evidence of pulmonary embolism or other acute intrathoracic process. 2. 4 mm noncalcified lung nodule within the posterior aspect of the right lower lobe. No follow-up needed if patient is low-risk. Non-contrast chest CT can be considered in 12 months if patient is high-risk. This recommendation follows the consensus statement: Guidelines for Management of Incidental  Pulmonary Nodules Detected on CT Images: From the Fleischner Society 2017; Radiology 2017; 284:228-243. 3. Evidence of prior cholecystectomy and gastric surgery. Electronically Signed   By: Virgina Norfolk M.D.   On: 07/14/2021 19:47   ECHOCARDIOGRAM COMPLETE  Result Date: 07/15/2021    ECHOCARDIOGRAM REPORT   Patient Name:   Michelle Armstrong Date of Exam: 07/15/2021 Medical Rec #:  [id number]             Height:       67.0 in Accession #:    [id number]            Weight:       164.0 lb Date of Birth:  1961-06-28             BSA:          1.859 m Patient Age:    58 years              BP:           112/75 mmHg Patient Gender: F                     HR:           84 bpm. Exam Location:  [facility] Procedure: 2D Echo Indications:     Chest Pain  History:         Patient has prior history of Echocardiogram examinations.                  Arrythmias:SVT; Risk Factors:Dyslipidemia.  Sonographer:     L Thornton-Maynard Referring Phys:  [id number] [place] Diagnosing Phys: Skeet Latch MD  Sonographer Comments: Suboptimal parasternal window. IMPRESSIONS  1. Left ventricular ejection fraction, by estimation, is 60 to 65%. The left ventricle has normal function. The left ventricle has no regional wall motion abnormalities. There is mild concentric left ventricular hypertrophy. Left ventricular diastolic parameters are consistent with Grade I diastolic dysfunction (impaired relaxation).  2. Right ventricular systolic function is normal. The right ventricular size is normal.  3. The mitral valve is normal in structure. Trivial mitral valve regurgitation. No evidence of mitral stenosis.  4. The aortic valve is normal in structure. Aortic valve regurgitation is not visualized. No aortic stenosis is present.  5. The inferior vena cava is normal in size with greater than 50% respiratory variability, suggesting right atrial pressure of 3 mmHg. FINDINGS  Left Ventricle: Left ventricular ejection fraction, by estimation, is 60  to 65%. The left ventricle has normal function. The left ventricle has no regional wall motion abnormalities. The left ventricular internal cavity size was normal in size. There is  mild concentric left ventricular hypertrophy. Left ventricular diastolic parameters are consistent with Grade I diastolic dysfunction (impaired relaxation). Indeterminate filling pressures. Right Ventricle: The right ventricular size is normal. No increase in right ventricular wall  thickness. Right ventricular systolic function is normal. Left Atrium: Left atrial size was normal in size. Right Atrium: Right atrial size was normal in size. Pericardium: There is no evidence of pericardial effusion. Mitral Valve: The mitral valve is normal in structure. Trivial mitral valve regurgitation. No evidence of mitral valve stenosis. Tricuspid Valve: The tricuspid valve is normal in structure. Tricuspid valve regurgitation is trivial. No evidence of tricuspid stenosis. Aortic Valve: The aortic valve is normal in structure. Aortic valve regurgitation is not visualized. No aortic stenosis is present. Aortic valve mean gradient measures 5.0 mmHg. Aortic valve peak gradient measures 7.5 mmHg. Aortic valve area, by VTI measures 1.64 cm. Pulmonic Valve: The pulmonic valve was normal in structure. Pulmonic valve regurgitation is not visualized. No evidence of pulmonic stenosis. Aorta: The aortic root is normal in size and structure. Venous: The inferior vena cava is normal in size with greater than 50% respiratory variability, suggesting right atrial pressure of 3 mmHg. IAS/Shunts: No atrial level shunt detected by color flow Doppler.  LEFT VENTRICLE PLAX 2D LVIDd:         3.70 cm   Diastology LVIDs:         2.20 cm   LV e' medial:    4.95 cm/s LV PW:         1.10 cm   LV E/e' medial:  10.3 LV IVS:        1.20 cm   LV e' lateral:   7.43 cm/s LVOT diam:     1.60 cm   LV E/e' lateral: 6.9 LV SV:         41 LV SV Index:   22 LVOT Area:     2.01 cm  RIGHT  VENTRICLE RV S prime:     14.20 cm/s TAPSE (M-mode): 2.6 cm LEFT ATRIUM             Index LA diam:        2.30 cm 1.24 cm/m LA Vol (A2C):   20.7 ml 11.14 ml/m LA Vol (A4C):   18.9 ml 10.17 ml/m LA Biplane Vol: 20.5 ml 11.03 ml/m  AORTIC VALVE AV Area (Vmax):    1.86 cm AV Area (Vmean):   1.67 cm AV Area (VTI):     1.64 cm AV Vmax:           137.00 cm/s AV Vmean:          111.000 cm/s AV VTI:            0.253 m AV Peak Grad:      7.5 mmHg AV Mean Grad:      5.0 mmHg LVOT Vmax:         127.00 cm/s LVOT Vmean:        92.100 cm/s LVOT VTI:          0.206 m LVOT/AV VTI ratio: 0.81  AORTA Ao Root diam: 3.20 cm MITRAL VALVE MV Area (PHT): 6.29 cm    SHUNTS MV E velocity: 51.00 cm/s  Systemic VTI:  0.21 m MV A velocity: 56.60 cm/s  Systemic Diam: 1.60 cm MV E/A ratio:  0.90 Skeet Latch MD Electronically signed by Skeet Latch MD Signature Date/Time: 07/15/2021/1:57:47 PM    Final     Scheduled Meds:  aspirin EC  81 mg Oral Daily   atorvastatin  40 mg Oral Daily   Continuous Infusions:  heparin 1,100 Units/hr (07/15/21 1526)   PRN Meds: acetaminophen, ALPRAZolam, nitroGLYCERIN, ondansetron (ZOFRAN) IV  Time spent: 35 minutes  Author: Val Riles. MD [facility] 07/15/2021 3:39 PM  To reach On-call, see care teams to locate the attending and reach out to them via [email or web address]. If 7PM-7AM, please contact night-coverage If you still have difficulty reaching the attending provider, please page the [facility] (Director on Call) for [facility] on amion for assistance.

## 2021-07-16 ENCOUNTER — Inpatient Hospital Stay
Admission: AD | Admit: 2021-07-16 | Payer: Managed Care, Other (non HMO) | Source: Other Acute Inpatient Hospital | Admitting: Cardiology

## 2021-07-16 ENCOUNTER — Ambulatory Visit (HOSPITAL_COMMUNITY)
Admission: RE | Admit: 2021-07-16 | Discharge: 2021-07-16 | Disposition: A | Discharge status: Home / Self Care | Payer: Managed Care, Other (non HMO) | Source: Ambulatory Visit | Attending: Cardiovascular Disease | Admitting: Cardiovascular Disease

## 2021-07-16 ENCOUNTER — Telehealth (HOSPITAL_COMMUNITY): Payer: Self-pay | Admitting: Emergency Medicine

## 2021-07-16 ENCOUNTER — Telehealth: Payer: Self-pay | Admitting: *Deleted

## 2021-07-16 ENCOUNTER — Other Ambulatory Visit: Payer: Self-pay

## 2021-07-16 DIAGNOSIS — R002 Palpitations: Secondary | ICD-10-CM

## 2021-07-16 DIAGNOSIS — Z789 Other specified health status: Secondary | ICD-10-CM | POA: Insufficient documentation

## 2021-07-16 DIAGNOSIS — R778 Other specified abnormalities of plasma proteins: Secondary | ICD-10-CM | POA: Diagnosis not present

## 2021-07-16 DIAGNOSIS — R079 Chest pain, unspecified: Secondary | ICD-10-CM | POA: Diagnosis not present

## 2021-07-16 DIAGNOSIS — I214 Non-ST elevation (NSTEMI) myocardial infarction: Secondary | ICD-10-CM | POA: Diagnosis not present

## 2021-07-16 DIAGNOSIS — I471 Supraventricular tachycardia: Secondary | ICD-10-CM

## 2021-07-16 LAB — CBC
HCT: 40.8 % (ref 36.0–46.0)
Hemoglobin: 14.1 g/dL (ref 12.0–15.0)
MCH: 30.7 pg (ref 26.0–34.0)
MCHC: 34.6 g/dL (ref 30.0–36.0)
MCV: 88.9 fL (ref 80.0–100.0)
Platelets: 251 10*3/uL (ref 150–400)
RBC: 4.59 MIL/uL (ref 3.87–5.11)
RDW: 13.7 % (ref 11.5–15.5)
WBC: 5.9 10*3/uL (ref 4.0–10.5)
nRBC: 0 % (ref 0.0–0.2)

## 2021-07-16 LAB — BASIC METABOLIC PANEL
Anion gap: 8 (ref 5–15)
BUN: 21 mg/dL — ABNORMAL HIGH (ref 6–20)
CO2: 25 mmol/L (ref 22–32)
Calcium: 9.1 mg/dL (ref 8.9–10.3)
Chloride: 104 mmol/L (ref 98–111)
Creatinine, Ser: 0.69 mg/dL (ref 0.44–1.00)
GFR, Estimated: >60 mL/min (ref >60)
Glucose, Bld: 127 mg/dL — ABNORMAL HIGH (ref 70–99)
Potassium: 3.9 mmol/L (ref 3.5–5.1)
Sodium: 137 mmol/L (ref 135–145)

## 2021-07-16 LAB — MAGNESIUM: Magnesium: 1.9 mg/dL (ref 1.7–2.4)

## 2021-07-16 LAB — HEPARIN LEVEL (UNFRACTIONATED): Heparin Unfractionated: 0.91 IU/mL — ABNORMAL HIGH (ref 0.30–0.70)

## 2021-07-16 LAB — PHOSPHORUS: Phosphorus: 4.2 mg/dL (ref 2.5–4.6)

## 2021-07-16 MED ORDER — IOHEXOL 350 MG/ML SOLN
95.0000 mL | Freq: Once | INTRAVENOUS | Status: AC | PRN
  Administered 2021-07-16: 95 mL via INTRAVENOUS

## 2021-07-16 MED ORDER — NITROGLYCERIN 0.4 MG SL SUBL
SUBLINGUAL_TABLET | SUBLINGUAL | Status: AC
  Administered 2021-07-16: 0.8 mg
  Filled 2021-07-16: qty 2

## 2021-07-16 MED ORDER — ATORVASTATIN CALCIUM 40 MG PO TABS: 40.0000 mg | ORAL_TABLET | Freq: Every day | ORAL | 0 refills | Status: DC

## 2021-07-16 MED ORDER — IVABRADINE HCL 5 MG PO TABS
10.0000 mg | ORAL_TABLET | Freq: Once | ORAL | Status: AC
  Administered 2021-07-16: 10 mg via ORAL
  Filled 2021-07-16: qty 2

## 2021-07-16 MED ORDER — NITROGLYCERIN 0.4 MG SL SUBL: 0.4000 mg | SUBLINGUAL_TABLET | SUBLINGUAL | 1 refills | Status: DC | PRN

## 2021-07-16 MED ORDER — DILTIAZEM HCL 25 MG/5ML IV SOLN
INTRAVENOUS | Status: AC
  Administered 2021-07-16: 10 mg via INTRAVENOUS
  Filled 2021-07-16: qty 5

## 2021-07-16 MED ORDER — DILTIAZEM HCL 25 MG/5ML IV SOLN: 10.0000 mg | Freq: Once | INTRAVENOUS | Status: AC

## 2021-07-16 NOTE — Progress Notes (Signed)
This RN provided discharge instructions and teaching to the patient. The patient verbalized and demonstrated understanding of the provided instructions. All outstanding questions resolved. L arm PIV removed. R arm PIV left in place due to order of Dr. Val Riles. Volunteer services to transport patient to private vehicle via wheelchair at time of departure.

## 2021-07-16 NOTE — Discharge Summary (Addendum)
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   Specialties: Physician Assistant, Cardiology, Radiology Why: @ 1:30pm Contact information: [address] [place] [address] [phone]                Diet recommendation: Cardiac diet  Activity: The patient is advised to gradually reintroduce usual activities, as tolerated  Discharge Condition: stable  Code Status: Full code   History of present illness: As per the H and P dictated on admission Hospital Course: Michelle Armstrong is a 60 y.o. 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  The episode that prompted the visit today was more severe, located on the left side of her chest radiating to the left arm and back associated with numbness bilateral hands and shortness of breath, lasting several hours.  Onset  was while sitting, unrelated to exertion and maximum intensity 8 out of 10.  She had no associated nausea or vomiting or diaphoresis. Patient otherwise denies any other symptoms and was previously in her usual state of health except for being in boot on the left leg following a mechanical fall 2 months prior resulting in a tibial fracture that is being managed nonoperatively.  She denies cough fever or chills and denies abdominal pain or diarrhea ED course: Vitals have been within normal limits Blood work: Troponin <2-->163-->249 with D-dimer 0.58 Labs otherwise unremarkable EKG, personally viewed and interpreted: NSR at 84 with no acute ST-T wave changes Imaging CT angio of the chest negative for PE Nonacute findings include 4 mm lung nodule posterior aspect right lower lobe with recommendation for follow-up noncontrast CT chest in 12 months if high risk   Patient was given nitroglycerin x3 in the ED with resolution of her chest pain.  Also given aspirin 325 and started on a heparin infusion.  Hospitalist consulted for admission for NSTEMI.   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 Upward trending troponin <2-163-249 but without EKG changes. S/p heparin infusion, continue Aspirin, atorvastatin and Coreg Nitroglycerin sublingual as needed chest pain, s/p morphine for breakthrough. Cardiology consulted, recommended coronary CTA as an outpatient and Zio patch as an outpatient.  Cleared by cardiology to discharge and follow-up as an outpatient. TTE shows LVEF 60%, grade  1 diastolic dysfunction, no any other active issues # History of SVT s/p ablation, No acute issues suspected. Cardiac monitoring during hospital stay and Zio patch on discharge. Ivabradine 10 mg given before discharge as per cardiology. # Pulmonary nodule less than 6 mm determined by  computed tomography of lung - "No follow-up needed if patient is low-risk. 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  Has no follow-up with hematology. CTA chest negative for acute PE # History of left fibula fracture 9/1, S/p cast removal of a left SLC fiber glass on 10/14, now and now left Cam Boot. Continue Ortho outpatient follow-up  Body mass index is 27.93 kg/m.  Nutrition Interventions:   On the day of the discharge the patient's vitals were stable, and no other acute medical condition were reported by patient. the patient was felt safe to be discharge at [place] done on patient's request (10/04/21) Coreg was discontinued as cardiology did not recommended but continued aspirin and Lipitor due to high cholesterol Patient wanted to include in her family history that her sister and her son had VTE    Consultants: Cardiology  Procedures: None  Discharge Exam: General: Appear in no distress, nno Rash; Oral Mucosa Clear, moist. Cardiovascular: S1 and S2 Present, no Murmur, Respiratory: normal respiratory effort, Bilateral Air entry present and no Crackles, no wheezes Abdomen: Bowel Sound present, Soft and nno tenderness, no hernia Extremities: no Pedal edema, no calf tenderness Neurology: alert and oriented to time, place, and person affect appropriate.  Filed Weights   07/14/21 1135 07/15/21 2206  Weight: 74.4 kg 80.9 kg   Vitals:   07/16/21 0817 07/16/21 1137  BP: 99/64 117/72  Pulse: 72 74  Resp: 17 17  Temp: 98.1 F (36.7 C) 98.3 F (36.8 C)  SpO2: 98% 98%    DISCHARGE MEDICATION: Allergies as of 07/16/2021       Reactions   Cephalexin    Rash & dizziness   Latex    rash   Metoprolol    REACTION: dizziness, slurred speech   Nsaids Other (See Comments)    ulcers ulcers   Penicillin G Itching   Pollen Extract         Medication List     STOP taking these medications    azithromycin 500 MG tablet Commonly known as: Zithromax   nitrofurantoin (macrocrystal-monohydrate) 100 MG capsule Commonly known as: Macrobid   traMADol 50 MG tablet Commonly known as: ULTRAM       TAKE these medications    aspirin EC 81 MG tablet Take 81 mg by mouth daily.   atorvastatin 40 MG tablet Commonly known as: LIPITOR Take 1 tablet (40 mg total) by mouth daily. Start taking on: July 17, 2021   benzonatate 200 MG capsule Commonly known as: TESSALON Take 1 capsule (200 mg total) by mouth 3 (three) times daily as needed for cough.   Calcium Carbonate 500 MG Chew Chew 1 tablet by mouth daily.   cholecalciferol 1000 units tablet Commonly known as: VITAMIN D Take 1,000 Units by mouth daily.   Collagen Hydrolysate Powd 2 Scoops as directed.   MULTIVITAMIN PO Take 1 tablet by mouth daily.   nitroGLYCERIN 0.4 MG SL tablet Commonly known as: NITROSTAT Place 1 tablet (0.4 mg total) under the tongue every 5 (five) minutes as needed for chest  pain.   psyllium 95 % Pack Commonly known as: HYDROCIL/METAMUCIL Take 1 packet by mouth daily as needed for mild constipation.   Super B Complex/FA/Vit C Tabs Take 1 tablet by mouth daily.   tiZANidine 4 MG tablet Commonly known as: Zanaflex Take 1 tablet (4 mg total) by mouth every 6 (six) hours as needed for muscle spasms.       Allergies  Allergen Reactions   Cephalexin     Rash & dizziness   Latex     rash   Metoprolol     REACTION: dizziness, slurred speech   Nsaids Other (See Comments)    ulcers ulcers   Penicillin G Itching   Pollen Extract    Discharge Instructions     Call MD for:  difficulty breathing, headache or visual disturbances   Complete by: As directed    Call MD for:  extreme fatigue   Complete by: As directed    Call MD for:  persistant dizziness or  light-headedness   Complete by: As directed    Call MD for:  persistant nausea and vomiting   Complete by: As directed    Call MD for:  severe uncontrolled pain   Complete by: As directed    Call MD for:  temperature >100.4   Complete by: As directed    Diet - low sodium heart healthy   Complete by: As directed    Discharge instructions   Complete by: As directed    Follow-up with PCP in 1 week Follow-up with cardiology in 1 week   Increase activity slowly   Complete by: As directed        The results of significant diagnostics from this hospitalization (including imaging, microbiology, ancillary and laboratory) are listed below for reference.    Significant Diagnostic Studies: DG Chest 2 View  Result Date: 07/14/2021 CLINICAL DATA:  Acute chest pain. EXAM: CHEST - 2 VIEW COMPARISON:  02/23/2017 and prior studies FINDINGS: The cardiomediastinal silhouette is unremarkable. Mild LEFT basilar scarring again noted. There is no evidence of focal airspace disease, pulmonary edema, suspicious pulmonary nodule/mass, pleural effusion, or pneumothorax. No acute bony abnormalities are identified. IMPRESSION: No active cardiopulmonary disease. Electronically Signed   By: Margarette Canada M.D.   On: 07/14/2021 12:19   CT Angio Chest PE W and/or Wo Contrast  Result Date: 07/14/2021 CLINICAL DATA:  Left-sided chest pain. EXAM: CT ANGIOGRAPHY CHEST WITH CONTRAST TECHNIQUE: Multidetector CT imaging of the chest was performed using the standard protocol during bolus administration of intravenous contrast. Multiplanar CT image reconstructions and MIPs were obtained to evaluate the vascular anatomy. CONTRAST:  38mL OMNIPAQUE IOHEXOL 350 MG/ML SOLN COMPARISON:  August 09, 2011 FINDINGS: Cardiovascular: Satisfactory opacification of the pulmonary arteries to the segmental level. No evidence of pulmonary embolism. Normal heart size. No pericardial effusion. Mediastinum/Nodes: No enlarged mediastinal, hilar, or  axillary lymph nodes. Thyroid gland, trachea, and esophagus demonstrate no significant findings. Lungs/Pleura: A 4 mm noncalcified lung nodule is seen within the posterior aspect of the right lower lobe (axial CT image 61, CT series 6). This represents a new finding when compared to the prior study. There is no evidence of acute infiltrate, pleural effusion or pneumothorax. Upper Abdomen: Surgical clips are seen within the gallbladder fossa. Surgical sutures are noted within the gastric region. Musculoskeletal: No chest wall abnormality. No acute or significant osseous findings. Review of the MIP images confirms the above findings. IMPRESSION: 1. No CT evidence of pulmonary embolism or other acute intrathoracic  process. 2. 4 mm noncalcified lung nodule within the posterior aspect of the right lower lobe. No follow-up needed if patient is low-risk. Non-contrast chest CT can be considered in 12 months if patient is high-risk. This recommendation follows the consensus statement: Guidelines for Management of Incidental Pulmonary Nodules Detected on CT Images: From the Fleischner Society 2017; Radiology 2017; 284:228-243. 3. Evidence of prior cholecystectomy and gastric surgery. Electronically Signed   By: Virgina Norfolk M.D.   On: 07/14/2021 19:47   MR Cervical Spine Wo Contrast  Result Date: 06/18/2021 CLINICAL DATA:  Initial evaluation for neck pain with radiation into both arms, left greater than right. Recent fall. EXAM: MRI CERVICAL SPINE WITHOUT CONTRAST TECHNIQUE: Multiplanar, multisequence MR imaging of the cervical spine was performed. No intravenous contrast was administered. COMPARISON:  Radiograph from 06/11/2021 and MRI from 05/15/2016. FINDINGS: Alignment: Straightening of the normal cervical lordosis with underlying mild left convex scoliosis. Trace anterolisthesis of C2 on C3 and C3 on C4, chronic and similar to previous. Vertebrae: Vertebral body height maintained without acute or chronic  fracture. Bone marrow signal intensity diffusely heterogeneous without worrisome osseous lesion. No abnormal marrow edema. Cord: Normal signal and morphology. Posterior Fossa, vertebral arteries, paraspinal tissues: Cerebellar tonsils are mildly low lying at the foramen magnum without frank Chiari malformation. Paraspinous and prevertebral soft tissues within normal limits. Normal flow voids seen within the vertebral arteries bilaterally. Disc levels: C2-C3: Negative interspace. Mild right-sided facet hypertrophy. No stenosis. C3-C4: Minimal annular disc bulge with right-sided facet hypertrophy. No spinal stenosis. Foramina remain patent. C4-C5: Mild annular disc bulge. Mild right-sided facet hypertrophy. No spinal stenosis. Mild right C5 foraminal narrowing. Left neural foramina remains patent. C5-C6: Degenerative intervertebral disc space narrowing with diffuse disc osteophyte complex, eccentric to the right. Broad posterior component flattens and effaces the ventral thecal sac, asymmetric to the right. Mild flattening of the ventral cord without cord signal changes. Mild right-sided spinal stenosis. Mild left with moderate right C6 foraminal narrowing. C6-C7: Degenerative intervertebral disc space narrowing with diffuse disc osteophyte complex. Broad posterior component flattens and partially faces the ventral thecal sac with resultant mild spinal stenosis. No frank cord deformity. Moderate left with mild right C7 foraminal narrowing. C7-T1: Negative interspace. Mild bilateral facet hypertrophy. No canal or foraminal stenosis. Visualized upper thoracic spine demonstrates no significant finding. IMPRESSION: 1. Right eccentric disc osteophyte complex at C5-6 with resultant mild canal and moderate right C6 foraminal stenosis. 2. Diffuse disc osteophyte complex at C6-7 with resultant mild canal and moderate left C7 foraminal stenosis. 3. Additional mild spondylosis and facet arthrosis elsewhere within the cervical  spine as above. No other significant stenosis or neural impingement. Electronically Signed   By: Jeannine Boga M.D.   On: 06/18/2021 06:00   MR Lumbar Spine Wo Contrast  Result Date: 06/18/2021 CLINICAL DATA:  Initial evaluation for compression fracture, fall 1 month ago. EXAM: MRI LUMBAR SPINE WITHOUT CONTRAST TECHNIQUE: Multiplanar, multisequence MR imaging of the lumbar spine was performed. No intravenous contrast was administered. COMPARISON:  Prior MRI from 06/17/2021. FINDINGS: Segmentation: Standard. Lowest well-formed disc space labeled the L5-S1 level. Alignment: Physiologic with preservation of the normal lumbar lordosis. No listhesis. Vertebrae: Evolving subacute compression fracture involving the superior endplate of L1 with mild 25% height loss and trace 2 mm bony retropulsion. Vertebral body height otherwise maintained. Bone marrow signal intensity heterogeneous without discrete or worrisome osseous lesion. No other abnormal marrow edema. Conus medullaris and cauda equina: Conus extends to the L1 level. Conus and cauda  equina appear normal. Paraspinal and other soft tissues: Paraspinous soft tissues within normal limits. Few benign appearing simple cyst noted within the visualized left kidney. Visualized visceral structures otherwise unremarkable. Disc levels: T12-L1: Minimal disc bulge with trace bony retropulsion related to the L1 compression fracture. No spinal stenosis. Foramina remain patent. L1-2:  Unremarkable. L2-3: Disc desiccation with mild annular disc bulge. Minimal facet spurring. No stenosis. L3-4: Disc desiccation with mild disc bulge. Mild facet hypertrophy. No spinal stenosis. Foramina remain patent. L4-5: Disc desiccation with mild disc bulge. Superimposed small left foraminal disc protrusion with annular fissure closely approximates the exiting left L4 nerve root (series 8, image 27). Moderate bilateral facet hypertrophy. Mild narrowing of the left lateral recess. Central  canal remains patent. No significant foraminal encroachment. L5-S1:  Negative interspace.  Mild facet hypertrophy.  No stenosis. IMPRESSION: 1. Evolving subacute compression fracture involving the superior endplate of L1 with mild 25% height loss and trace 2 mm bony retropulsion. No associated stenosis. 2. Small left foraminal disc protrusion with annular fissure at L4-5, closely approximating and potentially irritating the exiting left L4 nerve root. 3. Mild-to-moderate facet hypertrophy at L3-4 through L5-S1. Electronically Signed   By: Jeannine Boga M.D.   On: 06/18/2021 05:53   DG Knee Complete 4 Views Left  Result Date: 06/17/2021 CLINICAL DATA:  Left knee swelling. EXAM: LEFT KNEE - COMPLETE 4+ VIEW COMPARISON:  None. FINDINGS: Nondisplaced fibular neck fracture, reportedly known and still readily visible on the lateral view. No knee joint effusion. Subjective osteopenia. IMPRESSION: History of fibular neck fracture which is still visible. No displacement or knee joint effusion. Electronically Signed   By: Jorje Guild M.D.   On: 06/17/2021 11:04   ECHOCARDIOGRAM COMPLETE  Result Date: 07/15/2021    ECHOCARDIOGRAM REPORT   Patient Name:   LAEL WETHERBEE Date of Exam: 07/15/2021 Medical Rec #:  [id number]             Height:       67.0 in Accession #:    [id number]            Weight:       164.0 lb Date of Birth:  07-19-1961             BSA:          1.859 m Patient Age:    83 years              BP:           112/75 mmHg Patient Gender: F                     HR:           84 bpm. Exam Location:  [facility] Procedure: 2D Echo Indications:     Chest Pain  History:         Patient has prior history of Echocardiogram examinations.                  Arrythmias:SVT; Risk Factors:Dyslipidemia.  Sonographer:     L Thornton-Maynard Referring Phys:  [id number] [place] Diagnosing Phys: Skeet Latch MD  Sonographer Comments: Suboptimal parasternal window. IMPRESSIONS  1. Left ventricular ejection  fraction, by estimation, is 60 to 65%. The left ventricle has normal function. The left ventricle has no regional wall motion abnormalities. There is mild concentric left ventricular hypertrophy. Left ventricular diastolic parameters are consistent with Grade I diastolic dysfunction (impaired relaxation).  2. Right ventricular systolic function  is normal. The right ventricular size is normal.  3. The mitral valve is normal in structure. Trivial mitral valve regurgitation. No evidence of mitral stenosis.  4. The aortic valve is normal in structure. Aortic valve regurgitation is not visualized. No aortic stenosis is present.  5. The inferior vena cava is normal in size with greater than 50% respiratory variability, suggesting right atrial pressure of 3 mmHg. FINDINGS  Left Ventricle: Left ventricular ejection fraction, by estimation, is 60 to 65%. The left ventricle has normal function. The left ventricle has no regional wall motion abnormalities. The left ventricular internal cavity size was normal in size. There is  mild concentric left ventricular hypertrophy. Left ventricular diastolic parameters are consistent with Grade I diastolic dysfunction (impaired relaxation). Indeterminate filling pressures. Right Ventricle: The right ventricular size is normal. No increase in right ventricular wall thickness. Right ventricular systolic function is normal. Left Atrium: Left atrial size was normal in size. Right Atrium: Right atrial size was normal in size. Pericardium: There is no evidence of pericardial effusion. Mitral Valve: The mitral valve is normal in structure. Trivial mitral valve regurgitation. No evidence of mitral valve stenosis. Tricuspid Valve: The tricuspid valve is normal in structure. Tricuspid valve regurgitation is trivial. No evidence of tricuspid stenosis. Aortic Valve: The aortic valve is normal in structure. Aortic valve regurgitation is not visualized. No aortic stenosis is present. Aortic valve  mean gradient measures 5.0 mmHg. Aortic valve peak gradient measures 7.5 mmHg. Aortic valve area, by VTI measures 1.64 cm. Pulmonic Valve: The pulmonic valve was normal in structure. Pulmonic valve regurgitation is not visualized. No evidence of pulmonic stenosis. Aorta: The aortic root is normal in size and structure. Venous: The inferior vena cava is normal in size with greater than 50% respiratory variability, suggesting right atrial pressure of 3 mmHg. IAS/Shunts: No atrial level shunt detected by color flow Doppler.  LEFT VENTRICLE PLAX 2D LVIDd:         3.70 cm   Diastology LVIDs:         2.20 cm   LV e' medial:    4.95 cm/s LV PW:         1.10 cm   LV E/e' medial:  10.3 LV IVS:        1.20 cm   LV e' lateral:   7.43 cm/s LVOT diam:     1.60 cm   LV E/e' lateral: 6.9 LV SV:         41 LV SV Index:   22 LVOT Area:     2.01 cm  RIGHT VENTRICLE RV S prime:     14.20 cm/s TAPSE (M-mode): 2.6 cm LEFT ATRIUM             Index LA diam:        2.30 cm 1.24 cm/m LA Vol (A2C):   20.7 ml 11.14 ml/m LA Vol (A4C):   18.9 ml 10.17 ml/m LA Biplane Vol: 20.5 ml 11.03 ml/m  AORTIC VALVE AV Area (Vmax):    1.86 cm AV Area (Vmean):   1.67 cm AV Area (VTI):     1.64 cm AV Vmax:           137.00 cm/s AV Vmean:          111.000 cm/s AV VTI:            0.253 m AV Peak Grad:      7.5 mmHg AV Mean Grad:      5.0 mmHg LVOT Vmax:  127.00 cm/s LVOT Vmean:        92.100 cm/s LVOT VTI:          0.206 m LVOT/AV VTI ratio: 0.81  AORTA Ao Root diam: 3.20 cm MITRAL VALVE MV Area (PHT): 6.29 cm    SHUNTS MV E velocity: 51.00 cm/s  Systemic VTI:  0.21 m MV A velocity: 56.60 cm/s  Systemic Diam: 1.60 cm MV E/A ratio:  0.90 Skeet Latch MD Electronically signed by Skeet Latch MD Signature Date/Time: 07/15/2021/1:57:47 PM    Final    [place] LT LOWER EXTREM LTD SOFT TISSUE NON VASCULAR  Result Date: 06/17/2021 CLINICAL DATA:  Palpable lump above left knee noticed after fall 05/17/2021. EXAM: ULTRASOUND LEFT LOWER EXTREMITY  LIMITED TECHNIQUE: Ultrasound examination of the lower extremity soft tissues was performed in the area of clinical concern. COMPARISON:  None. FINDINGS: Joint Space: Not evaluated. Muscles: Normal. Tendons: Not evaluated. Other Soft Tissue Structures: Ultrasound examination over patient's palpable abnormality over the anterior soft tissues of the distal thigh just above the knee demonstrates an oval fairly well-defined hyperechoic mass in the subcutaneous fat just below the skin measuring 4 x 8 x 10 mm compatible with a small lipoma. IMPRESSION: 1 cm lipoma over the anterior left leg just above the knee accounting for patient's palpable abnormality. Electronically Signed   By: Marin Olp M.D.   On: 06/17/2021 12:50    Microbiology: Recent Results (from the past 240 hour(s))  Resp Panel by RT-PCR (Flu A&B, Covid) Nasopharyngeal Swab     Status: None   Collection Time: 07/14/21  5:25 PM   Specimen: Nasopharyngeal Swab; Nasopharyngeal(NP) swabs in vial transport medium  Result Value Ref Range Status   SARS Coronavirus 2 by RT PCR NEGATIVE NEGATIVE Final    Comment: (NOTE) SARS-CoV-2 target nucleic acids are NOT DETECTED.  The SARS-CoV-2 RNA is generally detectable in upper respiratory specimens during the acute phase of infection. The lowest concentration of SARS-CoV-2 viral copies this assay can detect is 138 copies/mL. A negative result does not preclude SARS-Cov-2 infection and should not be used as the sole basis for treatment or other patient management decisions. A negative result may occur with  improper specimen collection/handling, submission of specimen other than nasopharyngeal swab, presence of viral mutation(s) within the areas targeted by this assay, and inadequate number of viral copies(<138 copies/mL). A negative result must be combined with clinical observations, patient history, and epidemiological information. The expected result is Negative.  Fact Sheet for Patients:   EntrepreneurPulse.com.au  Fact Sheet for Healthcare Providers:  IncredibleEmployment.be  This test is no t yet approved or cleared by the [place] FDA and  has been authorized for detection and/or diagnosis of SARS-CoV-2 by FDA under an Emergency Use Authorization (EUA). This EUA will remain  in effect (meaning this test can be used) for the duration of the COVID-19 declaration under Section 564(b)(1) of the Act, 21 U.S.C.section 360bbb-3(b)(1), unless the authorization is terminated  or revoked sooner.       Influenza A by PCR NEGATIVE NEGATIVE Final   Influenza B by PCR NEGATIVE NEGATIVE Final    Comment: (NOTE) The Xpert Xpress SARS-CoV-2/FLU/RSV plus assay is intended as an aid in the diagnosis of influenza from Nasopharyngeal swab specimens and should not be used as a sole basis for treatment. Nasal washings and aspirates are unacceptable for Xpert Xpress SARS-CoV-2/FLU/RSV testing.  Fact Sheet for Patients: [employer]  Fact Sheet for Healthcare Providers: IncredibleEmployment.be  This test is not yet approved or  cleared by the [place] and has been authorized for detection and/or diagnosis of SARS-CoV-2 by FDA under an Emergency Use Authorization (EUA). This EUA will remain in effect (meaning this test can be used) for the duration of the COVID-19 declaration under Section 564(b)(1) of the Act, 21 U.S.C. section 360bbb-3(b)(1), unless the authorization is terminated or revoked.  Performed at [facility], [address] [place], [place] [address]      Labs: CBC: Recent Labs  Lab 07/14/21 1141 07/15/21 0528 07/16/21 0527  WBC 6.6 6.7 5.9  HGB 14.4 13.0 14.1  HCT 41.3 37.9 40.8  MCV 88.8 89.0 88.9  PLT 308 268 606   Basic Metabolic Panel: Recent Labs  Lab 07/14/21 1141 07/15/21 0528 07/16/21 0527  NA 139 137 137  K 3.6 4.2 3.9  CL 103 104  104  CO2 25 25 25   GLUCOSE 110* 87 127*  BUN 28* 21* 21*  CREATININE 0.63 0.66 0.69  CALCIUM 9.8 9.0 9.1  MG  --   --  1.9  PHOS  --   --  4.2   Liver Function Tests: No results for input(s): AST, ALT, ALKPHOS, BILITOT, PROT, ALBUMIN in the last 168 hours. No results for input(s): LIPASE, AMYLASE in the last 168 hours. No results for input(s): AMMONIA in the last 168 hours. Cardiac Enzymes: No results for input(s): CKTOTAL, CKMB, CKMBINDEX, TROPONINI in the last 168 hours. BNP (last 3 results) No results for input(s): BNP in the last 8760 hours. CBG: No results for input(s): GLUCAP in the last 168 hours.  Time spent: 35 minutes  Signed:  Val Riles  [facility]  07/16/2021 1:52 PM

## 2021-07-16 NOTE — Progress Notes (Addendum)
Progress Note  Patient Name: Michelle Armstrong Date of Encounter: 07/16/2021  [facility] Cardiologist: Caryl Comes  Subjective   Patient still reports some vague chest soreness but no frank pain like what brought her in.  Heart rate was a little elevated earlier.  No shortness of breath.  Hips still hurt.  Inpatient Medications    Scheduled Meds:  aspirin EC  81 mg Oral Daily   atorvastatin  40 mg Oral Daily   Continuous Infusions:  heparin 950 Units/hr (07/16/21 0616)   PRN Meds: acetaminophen, ALPRAZolam, nitroGLYCERIN, ondansetron (ZOFRAN) IV   Vital Signs    Vitals:   07/15/21 1620 07/15/21 2025 07/15/21 2206 07/16/21 0613  BP: 109/72 101/67 (!) 105/56 105/73  Pulse: 79 73 75 74  Resp: 16 14 18    Temp:   97.8 F (36.6 C) 98.3 F (36.8 C)  TempSrc:    Oral  SpO2: 98% 93% 97% 97%  Weight:   80.9 kg   Height:        Intake/Output Summary (Last 24 hours) at 07/16/2021 0814 Last data filed at 07/16/2021 0616 Gross per 24 hour  Intake --  Output 200 ml  Net -200 ml   Last 3 Weights 07/15/2021 07/14/2021 04/19/2021  Weight (lbs) 178 lb 5.6 oz 164 lb 162 lb  Weight (kg) 80.9 kg 74.39 kg 73.483 kg  Some encounter information is confidential and restricted. Go to Review Flowsheets activity to see all data.      Telemetry    Sinus rhythm- Personally Reviewed  ECG    07/14/2021: Normal sinus rhythm without abnormality - Personally Reviewed  Physical Exam   GEN: No acute distress.  Husband at the bedside. Neck: No JVD Cardiac: RRR, no murmurs, rubs, or gallops.  Respiratory: Clear to auscultation bilaterally. GI: Soft, nontender, non-distended  MS: No edema; Neuro:  Nonfocal  Psych: Appears anxious  Labs    High Sensitivity Troponin:   Recent Labs  Lab 07/14/21 1141 07/14/21 1457 07/14/21 1816 07/14/21 1956 07/15/21 1028  TROPONINIHS <2 163* 249* 300* 279*     Chemistry Recent Labs  Lab 07/14/21 1141 07/15/21 0528 07/16/21 0527  NA  139 137 137  K 3.6 4.2 3.9  CL 103 104 104  CO2 25 25 25   GLUCOSE 110* 87 127*  BUN 28* 21* 21*  CREATININE 0.63 0.66 0.69  CALCIUM 9.8 9.0 9.1  MG  --   --  1.9  GFRNONAA >60 >60 >60  ANIONGAP 11 8 8     Lipids  Recent Labs  Lab 07/15/21 0528  CHOL 226*  TRIG 67  HDL 92  LDLCALC 121*  CHOLHDL 2.5    Hematology Recent Labs  Lab 07/14/21 1141 07/15/21 0528 07/16/21 0527  WBC 6.6 6.7 5.9  RBC 4.65 4.26 4.59  HGB 14.4 13.0 14.1  HCT 41.3 37.9 40.8  MCV 88.8 89.0 88.9  MCH 31.0 30.5 30.7  MCHC 34.9 34.3 34.6  RDW 13.6 14.0 13.7  PLT 308 268 251   Thyroid  Recent Labs  Lab 07/15/21 0528  TSH 3.992    BNPNo results for input(s): BNP, PROBNP in the last 168 hours.  DDimer  Recent Labs  Lab 07/14/21 1725  DDIMER 0.58*     Radiology    DG Chest 2 View  Result Date: 07/14/2021 CLINICAL DATA:  Acute chest pain. EXAM: CHEST - 2 VIEW COMPARISON:  02/23/2017 and prior studies FINDINGS: The cardiomediastinal silhouette is unremarkable. Mild LEFT basilar scarring again noted. There is no  evidence of focal airspace disease, pulmonary edema, suspicious pulmonary nodule/mass, pleural effusion, or pneumothorax. No acute bony abnormalities are identified. IMPRESSION: No active cardiopulmonary disease. Electronically Signed   By: Margarette Canada M.D.   On: 07/14/2021 12:19   CT Angio Chest PE W and/or Wo Contrast  Result Date: 07/14/2021 CLINICAL DATA:  Left-sided chest pain. EXAM: CT ANGIOGRAPHY CHEST WITH CONTRAST TECHNIQUE: Multidetector CT imaging of the chest was performed using the standard protocol during bolus administration of intravenous contrast. Multiplanar CT image reconstructions and MIPs were obtained to evaluate the vascular anatomy. CONTRAST:  41mL OMNIPAQUE IOHEXOL 350 MG/ML SOLN COMPARISON:  August 09, 2011 FINDINGS: Cardiovascular: Satisfactory opacification of the pulmonary arteries to the segmental level. No evidence of pulmonary embolism. Normal heart size.  No pericardial effusion. Mediastinum/Nodes: No enlarged mediastinal, hilar, or axillary lymph nodes. Thyroid gland, trachea, and esophagus demonstrate no significant findings. Lungs/Pleura: A 4 mm noncalcified lung nodule is seen within the posterior aspect of the right lower lobe (axial CT image 61, CT series 6). This represents a new finding when compared to the prior study. There is no evidence of acute infiltrate, pleural effusion or pneumothorax. Upper Abdomen: Surgical clips are seen within the gallbladder fossa. Surgical sutures are noted within the gastric region. Musculoskeletal: No chest wall abnormality. No acute or significant osseous findings. Review of the MIP images confirms the above findings. IMPRESSION: 1. No CT evidence of pulmonary embolism or other acute intrathoracic process. 2. 4 mm noncalcified lung nodule within the posterior aspect of the right lower lobe. No follow-up needed if patient is low-risk. Non-contrast chest CT can be considered in 12 months if patient is high-risk. This recommendation follows the consensus statement: Guidelines for Management of Incidental Pulmonary Nodules Detected on CT Images: From the Fleischner Society 2017; Radiology 2017; 284:228-243. 3. Evidence of prior cholecystectomy and gastric surgery. Electronically Signed   By: Virgina Norfolk M.D.   On: 07/14/2021 19:47   ECHOCARDIOGRAM COMPLETE  Result Date: 07/15/2021    ECHOCARDIOGRAM REPORT   Patient Name:   Michelle Armstrong Date of Exam: 07/15/2021 Medical Rec #:  [id number]             Height:       67.0 in Accession #:    [id number]            Weight:       164.0 lb Date of Birth:  November 22, 1960             BSA:          1.859 m Patient Age:    60 years              BP:           112/75 mmHg Patient Gender: F                     HR:           84 bpm. Exam Location:  [facility] Procedure: 2D Echo Indications:     Chest Pain  History:         Patient has prior history of Echocardiogram examinations.                   Arrythmias:SVT; Risk Factors:Dyslipidemia.  Sonographer:     L Thornton-Maynard Referring Phys:  [id number] [place] Diagnosing Phys: Skeet Latch MD  Sonographer Comments: Suboptimal parasternal window. IMPRESSIONS  1. Left ventricular ejection fraction, by estimation, is 60 to 65%. The  left ventricle has normal function. The left ventricle has no regional wall motion abnormalities. There is mild concentric left ventricular hypertrophy. Left ventricular diastolic parameters are consistent with Grade I diastolic dysfunction (impaired relaxation).  2. Right ventricular systolic function is normal. The right ventricular size is normal.  3. The mitral valve is normal in structure. Trivial mitral valve regurgitation. No evidence of mitral stenosis.  4. The aortic valve is normal in structure. Aortic valve regurgitation is not visualized. No aortic stenosis is present.  5. The inferior vena cava is normal in size with greater than 50% respiratory variability, suggesting right atrial pressure of 3 mmHg. FINDINGS  Left Ventricle: Left ventricular ejection fraction, by estimation, is 60 to 65%. The left ventricle has normal function. The left ventricle has no regional wall motion abnormalities. The left ventricular internal cavity size was normal in size. There is  mild concentric left ventricular hypertrophy. Left ventricular diastolic parameters are consistent with Grade I diastolic dysfunction (impaired relaxation). Indeterminate filling pressures. Right Ventricle: The right ventricular size is normal. No increase in right ventricular wall thickness. Right ventricular systolic function is normal. Left Atrium: Left atrial size was normal in size. Right Atrium: Right atrial size was normal in size. Pericardium: There is no evidence of pericardial effusion. Mitral Valve: The mitral valve is normal in structure. Trivial mitral valve regurgitation. No evidence of mitral valve stenosis. Tricuspid Valve: The  tricuspid valve is normal in structure. Tricuspid valve regurgitation is trivial. No evidence of tricuspid stenosis. Aortic Valve: The aortic valve is normal in structure. Aortic valve regurgitation is not visualized. No aortic stenosis is present. Aortic valve mean gradient measures 5.0 mmHg. Aortic valve peak gradient measures 7.5 mmHg. Aortic valve area, by VTI measures 1.64 cm. Pulmonic Valve: The pulmonic valve was normal in structure. Pulmonic valve regurgitation is not visualized. No evidence of pulmonic stenosis. Aorta: The aortic root is normal in size and structure. Venous: The inferior vena cava is normal in size with greater than 50% respiratory variability, suggesting right atrial pressure of 3 mmHg. IAS/Shunts: No atrial level shunt detected by color flow Doppler.  LEFT VENTRICLE PLAX 2D LVIDd:         3.70 cm   Diastology LVIDs:         2.20 cm   LV e' medial:    4.95 cm/s LV PW:         1.10 cm   LV E/e' medial:  10.3 LV IVS:        1.20 cm   LV e' lateral:   7.43 cm/s LVOT diam:     1.60 cm   LV E/e' lateral: 6.9 LV SV:         41 LV SV Index:   22 LVOT Area:     2.01 cm  RIGHT VENTRICLE RV S prime:     14.20 cm/s TAPSE (M-mode): 2.6 cm LEFT ATRIUM             Index LA diam:        2.30 cm 1.24 cm/m LA Vol (A2C):   20.7 ml 11.14 ml/m LA Vol (A4C):   18.9 ml 10.17 ml/m LA Biplane Vol: 20.5 ml 11.03 ml/m  AORTIC VALVE AV Area (Vmax):    1.86 cm AV Area (Vmean):   1.67 cm AV Area (VTI):     1.64 cm AV Vmax:           137.00 cm/s AV Vmean:  111.000 cm/s AV VTI:            0.253 m AV Peak Grad:      7.5 mmHg AV Mean Grad:      5.0 mmHg LVOT Vmax:         127.00 cm/s LVOT Vmean:        92.100 cm/s LVOT VTI:          0.206 m LVOT/AV VTI ratio: 0.81  AORTA Ao Root diam: 3.20 cm MITRAL VALVE MV Area (PHT): 6.29 cm    SHUNTS MV E velocity: 51.00 cm/s  Systemic VTI:  0.21 m MV A velocity: 56.60 cm/s  Systemic Diam: 1.60 cm MV E/A ratio:  0.90 Skeet Latch MD Electronically signed by  Skeet Latch MD Signature Date/Time: 07/15/2021/1:57:47 PM    Final     Cardiac Studies   See TTE above.  LHC (02/24/2017): 1. Normal coronary arteries with no evidence of obstructive coronary artery disease. Mildly sluggish flow in the left coronary system especially the LAD. 2. Normal LV systolic function by echo. Left ventricular angiography was not performed. Normal left ventricular Tamya Denardo-diastolic pressure.  Patient Profile     60 y.o. female MI in 02/2017 with catheterization showing normal coronaries and concern for endothelial dysfunction, SVT status post ablation in 2011, reported family history of hypercoagulable state, hyperlipidemia, recent mechanical fall with lumbar compression left ankle fractures, and obesity status post bariatric surgery, admitted with chest pain and elevated troponin.  Assessment & Plan    Elevated troponin: Presentation similar to 2018, though patient reports this episode was much worse in severity.  It ultimately improved with nitroglycerin that she received after waiting in the ED for several hours.  She has vague soreness in her chest at this time but otherwise feels well.  High-sensitivity troponin I peaked 2 days ago at 300.  Coronary CTA was recommended by Dr. Oval Linsey but is not available to be performed today at [facility].  Echocardiogram showed normal LVEF without wall motion abnormality. -Agree with recommendations for coronary CTA though this is not technically possible at [facility] today.  I have discussed alternatives including cardiac catheterization and myocardial perfusion stress testing, which Ms. Grosz declines.  We will attempt to arrange transfer to [facility] for inpatient coronary CTA if a bed is available. -Continue aspirin and IV heparin (at least 48 hours for exclusion of significant CAD). -Continue atorvastatin. -Would favor addition of diltiazem for possible vasospasm or microvascular dysfunction, though patient is very reluctant to take  medications unless absolutely necessary.  She has had significant side effects from metoprolol in the past as well.  Hyperlipidemia: LDL mildly elevated yesterday. -Continue atorvastatin 40 mg daily.  Palpitations: Some elevated heart rates noted prior to arrival and since admission. -Plan for ZIO AT at discharge.  Greater than 60 minutes were devoted to this encounter including counseling the patient and her husband as well as arranging for possible transfer to [facility] for inpatient coronary CTA.  Addendum (07/16/21 9:49 AM): Limited bed availability at [facility] will likely delay transfer for coronary CTA.  Given lack of ongoing symptoms, we have agreed to discharge Ms. Donlon this afternoon so that her husband can transport her to [facility] for outpatient coronary CTA.  She will need to remain at [facility] until the study has been read; if significant findings are identified, she will be referred to ED.  In anticipation of CTA, ivabradine 10 mg will be given x 1 at 1430 today with plans  for scan at 1630.  ZIO AT monitor will be mailed to the patient.  Addendum (08/27/21 9:38 AM - Entered at the patient's request): Given that coronary CTA showed no evidence of coronary artery disease, it is reasonable to hold statin therapy and pursue lifestyle modifications to help lower mildly elevated LDL.  Recommendations were discussed with Ms. Bohanon by phone after completion of coronary CTA.  Addendum (10/25/21 2:57 PM - Entered at the patient's request): Patient disputes that she received/took atorvastatin, which is included in her scheduled medications.  This medication was appropriately ordered in the setting of elevated troponin pending further ischemia evaluation.  However, MAR indicates that Ms. Siemen refused the medication during her hospitalization.  Given absence of CAD on subsequent coronary CTA, it is appropriate to defer statin therapy at this time.  Statement of "left ankle  fractures" in patient profile should read left ankle sprain and left fibula fractures.  Diagnosis of obesity status post bariatric surgery is also questioned by Ms. Herren but is accurate given documented BMI of up to 36.6 in 2012 and history of bariatric surgery.  Patient is not obese at this time.  For questions or updates, please contact Grace Please consult [email or web address] for contact info under [facility].     Signed, Nelva Bush, MD  07/16/2021, 8:14 AM

## 2021-07-16 NOTE — Consult Note (Signed)
ANTICOAGULATION CONSULT NOTE - Initial Consult  Pharmacy Consult for Heparin gtt Indication: chest pain/ACS  Allergies  Allergen Reactions   Cephalexin     Rash & dizziness   Latex     rash   Metoprolol     REACTION: dizziness, slurred speech   Nsaids Other (See Comments)    ulcers ulcers   Penicillin G Itching   Pollen Extract     Patient Measurements: Height: 5\' 7"  (170.2 cm) Weight: 80.9 kg (178 lb 5.6 oz) IBW/kg (Calculated) : 61.6 Heparin Dosing Weight: 74.4kg   Vital Signs: Temp: 97.8 F (36.6 C) (10/30 2206) BP: 105/56 (10/30 2206) Pulse Rate: 75 (10/30 2206)  Labs: Recent Labs    07/14/21 1141 07/14/21 1457 07/14/21 1725 07/14/21 1816 07/14/21 1956 07/15/21 0116 07/15/21 0528 07/15/21 0540 07/15/21 1028 07/16/21 0527  HGB 14.4  --   --   --   --   --  13.0  --   --  14.1  HCT 41.3  --   --   --   --   --  37.9  --   --  40.8  PLT 308  --   --   --   --   --  268  --   --  251  APTT  --   --  25  --   --   --   --   --   --   --   LABPROT  --   --  13.4  --   --   --   --   --   --   --   INR  --   --  1.0  --   --   --   --   --   --   --   HEPARINUNFRC  --   --   --   --   --  0.54  --  0.70  --  0.91*  CREATININE 0.63  --   --   --   --   --  0.66  --   --  0.69  TROPONINIHS <2   < >  --  249* 300*  --   --   --  279*  --    < > = values in this interval not displayed.     Estimated Creatinine Clearance: 83.9 mL/min (by C-G formula based on SCr of 0.69 mg/dL).   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 02/2010 Echo: EF 55-60%, no  rwma.   Vitamin D deficiency    low    Assessment: Patient with unclear hx of clotting disorder (no personal history of DVT/PE), HLD, liver hemangioma, SVT, and NSTEMI in 2018.  Pharmacy consulted for to manage heparin drip. Heparin bolus given by MD orders.  Baseline labs aPTT 25 INR 1.0 Plt 308 H/H = 14.4/41.3  Goal of Therapy:  Heparin level 0.3-0.7 units/ml Monitor platelets by anticoagulation protocol: Yes   Plan:  10/31:  HL @ 0527 = 0.91 Will decrease heparin drip rate to 950 units/hr and recheck HL 6 hrs after rate change.   Ariely Riddell D 07/16/2021 6:12 AM

## 2021-07-16 NOTE — Telephone Encounter (Signed)
-----   Message from Nelva Bush, MD sent at 07/16/2021 12:50 PM EDT ----- Regarding: Zio and follow-up Hello,  Patient is being discharged this afternoon and traveling to [facility] for coronary CTA to be done later today.  She will also need a ZIO AT for evaluation of palpitations and history of SVT.  Could you arrange for a ZIO AT to be mailed to her to be worn for 2 weeks?  She will also need to follow-up in the office with an APP in 6 weeks.  Please let me know if any questions or concerns arise.  Thanks.  Gerald Stabs

## 2021-07-16 NOTE — Telephone Encounter (Signed)
Reaching out to patient to offer assistance regarding upcoming cardiac imaging study; pt verbalizes understanding of appt date/time, parking situation and where to check in, pre-test NPO status and medications ordered, and verified current allergies; name and call back number provided for further questions should they arise Marchia Bond RN Navigator Cardiac Imaging [facility] [phone] office [phone] cell  10mg  ivabradine  PIV 20g in place from [employer] (I warned patient that we would need something larger for contrast injection)

## 2021-07-16 NOTE — Telephone Encounter (Signed)
Pt has been discharged.  Attempted to call pt to discuss instructions regarding ZIO AT monitor.  Will need to confirm emergency contact to place on order for live monitor.   Forwarding to scheduling and triage to scheduled pt for in office appt with an APP in 6 weeks and to follow up with pt to discuss Zio AT monitor.   Orders pending for monitor in this encounter.

## 2021-07-16 NOTE — Plan of Care (Signed)
  Problem: Education: Goal: Knowledge of General Education information will improve Description: Including pain rating scale, medication(s)/side effects and non-pharmacologic comfort measures 07/16/2021 1148 by Cristela Blue, RN Outcome: Progressing 07/16/2021 1148 by Cristela Blue, RN Outcome: Progressing   Problem: Health Behavior/Discharge Planning: Goal: Ability to manage health-related needs will improve 07/16/2021 1148 by Cristela Blue, RN Outcome: Progressing 07/16/2021 1148 by Cristela Blue, RN Outcome: Progressing   Problem: Clinical Measurements: Goal: Ability to maintain clinical measurements within normal limits will improve 07/16/2021 1148 by Cristela Blue, RN Outcome: Progressing 07/16/2021 1148 by Cristela Blue, RN Outcome: Progressing Goal: Will remain free from infection 07/16/2021 1148 by Cristela Blue, RN Outcome: Progressing 07/16/2021 1148 by Cristela Blue, RN Outcome: Progressing Goal: Diagnostic test results will improve 07/16/2021 1148 by Cristela Blue, RN Outcome: Progressing 07/16/2021 1148 by Cristela Blue, RN Outcome: Progressing Goal: Respiratory complications will improve 07/16/2021 1148 by Cristela Blue, RN Outcome: Progressing 07/16/2021 1148 by Cristela Blue, RN Outcome: Progressing Goal: Cardiovascular complication will be avoided 07/16/2021 1148 by Cristela Blue, RN Outcome: Progressing 07/16/2021 1148 by Cristela Blue, RN Outcome: Progressing   Problem: Activity: Goal: Risk for activity intolerance will decrease 07/16/2021 1148 by Cristela Blue, RN Outcome: Progressing 07/16/2021 1148 by Cristela Blue, RN Outcome: Progressing   Problem: Nutrition: Goal: Adequate nutrition will be maintained 07/16/2021 1148 by Cristela Blue, RN Outcome: Progressing 07/16/2021 1148 by Cristela Blue, RN Outcome: Progressing   Problem: Coping: Goal: Level of anxiety will decrease 07/16/2021 1148 by Cristela Blue, RN Outcome:  Progressing 07/16/2021 1148 by Cristela Blue, RN Outcome: Progressing   Problem: Elimination: Goal: Will not experience complications related to bowel motility 07/16/2021 1148 by Cristela Blue, RN Outcome: Progressing 07/16/2021 1148 by Cristela Blue, RN Outcome: Progressing Goal: Will not experience complications related to urinary retention 07/16/2021 1148 by Cristela Blue, RN Outcome: Progressing 07/16/2021 1148 by Cristela Blue, RN Outcome: Progressing   Problem: Pain Managment: Goal: General experience of comfort will improve 07/16/2021 1148 by Cristela Blue, RN Outcome: Progressing 07/16/2021 1148 by Cristela Blue, RN Outcome: Progressing   Problem: Safety: Goal: Ability to remain free from injury will improve 07/16/2021 1148 by Cristela Blue, RN Outcome: Progressing 07/16/2021 1148 by Cristela Blue, RN Outcome: Progressing   Problem: Skin Integrity: Goal: Risk for impaired skin integrity will decrease 07/16/2021 1148 by Cristela Blue, RN Outcome: Progressing 07/16/2021 1148 by Cristela Blue, RN Outcome: Progressing   Problem: Education: Goal: Understanding of cardiac disease, CV risk reduction, and recovery process will improve 07/16/2021 1148 by Cristela Blue, RN Outcome: Progressing 07/16/2021 1148 by Cristela Blue, RN Outcome: Progressing Goal: Individualized Educational Video(s) 07/16/2021 1148 by Cristela Blue, RN Outcome: Progressing 07/16/2021 1148 by Cristela Blue, RN Outcome: Progressing   Problem: Activity: Goal: Ability to tolerate increased activity will improve 07/16/2021 1148 by Cristela Blue, RN Outcome: Progressing 07/16/2021 1148 by Cristela Blue, RN Outcome: Progressing   Problem: Cardiac: Goal: Ability to achieve and maintain adequate cardiovascular perfusion will improve 07/16/2021 1148 by Cristela Blue, RN Outcome: Progressing 07/16/2021 1148 by Cristela Blue, RN Outcome: Progressing   Problem: Health  Behavior/Discharge Planning: Goal: Ability to safely manage health-related needs after discharge will improve 07/16/2021 1148 by Cristela Blue, RN Outcome: Progressing 07/16/2021 1148 by Cristela Blue, RN Outcome: Progressing

## 2021-07-17 ENCOUNTER — Telehealth: Payer: Self-pay | Admitting: Internal Medicine

## 2021-07-17 ENCOUNTER — Ambulatory Visit (INDEPENDENT_AMBULATORY_CARE_PROVIDER_SITE_OTHER): Payer: Managed Care, Other (non HMO)

## 2021-07-17 ENCOUNTER — Other Ambulatory Visit: Payer: Self-pay

## 2021-07-17 DIAGNOSIS — I471 Supraventricular tachycardia: Secondary | ICD-10-CM

## 2021-07-17 DIAGNOSIS — R002 Palpitations: Secondary | ICD-10-CM

## 2021-07-17 NOTE — Telephone Encounter (Signed)
Patient calling for clearance of physical therapy for a fall of broken bones. Contact Edd Fabian or [facility] [phone] fax [phone]

## 2021-07-17 NOTE — Telephone Encounter (Signed)
Was able to reach back out to Michelle Armstrong, reviewed ZIO "Live" Monitor, all questions and concerns address, instructions for placement provided. Educated pt on monitor wear and that she will be alerted as well as staff for any abnormity or critical results while wearing the monitor as it is "live". Pt verbalized understanding.    Provided ZIO trouble shoot number [phone]  Advised monitor to be mailed to her, if not arrived by Monday, then call to check on status. F/U appt made with Christell Faith, PA-C 12/13. Mrs. Michelle Armstrong thankful for return call and information provided.   ZIO AT has been order, emergency contact placed, and has been register for home delivery

## 2021-07-17 NOTE — Telephone Encounter (Signed)
Patient returning call.

## 2021-07-17 NOTE — Progress Notes (Signed)
Patient called and would like to have dx reviewed and medication has been added.

## 2021-07-18 ENCOUNTER — Ambulatory Visit: Payer: Managed Care, Other (non HMO)

## 2021-07-18 ENCOUNTER — Encounter: Payer: Self-pay | Admitting: Internal Medicine

## 2021-07-18 NOTE — Telephone Encounter (Signed)
Confirmed with [facility] PT that they have already received clearance.  This has been completed.  No further needs at this time.

## 2021-07-19 ENCOUNTER — Telehealth: Payer: Self-pay

## 2021-07-19 NOTE — Telephone Encounter (Signed)
Transition Care Management Unsuccessful Follow-up Telephone Call  Date of discharge and from where:  07/16/2021 / [facility]  Attempts:  1st Attempt  Reason for unsuccessful TCM follow-up call:  Patient refused to verify HIPAA or complete call.   Quinn Plowman RN,BSN,CCM RN Case Manager [place].  [phone]

## 2021-07-20 ENCOUNTER — Ambulatory Visit: Payer: Managed Care, Other (non HMO) | Attending: Physician Assistant

## 2021-07-20 ENCOUNTER — Other Ambulatory Visit: Payer: Self-pay

## 2021-07-20 ENCOUNTER — Telehealth: Payer: Self-pay

## 2021-07-20 DIAGNOSIS — R2689 Other abnormalities of gait and mobility: Secondary | ICD-10-CM | POA: Insufficient documentation

## 2021-07-20 DIAGNOSIS — G8929 Other chronic pain: Secondary | ICD-10-CM | POA: Insufficient documentation

## 2021-07-20 DIAGNOSIS — M6281 Muscle weakness (generalized): Secondary | ICD-10-CM | POA: Insufficient documentation

## 2021-07-20 DIAGNOSIS — R278 Other lack of coordination: Secondary | ICD-10-CM | POA: Insufficient documentation

## 2021-07-20 DIAGNOSIS — R2681 Unsteadiness on feet: Secondary | ICD-10-CM | POA: Insufficient documentation

## 2021-07-20 DIAGNOSIS — M545 Low back pain, unspecified: Secondary | ICD-10-CM | POA: Diagnosis present

## 2021-07-20 DIAGNOSIS — R262 Difficulty in walking, not elsewhere classified: Secondary | ICD-10-CM | POA: Diagnosis present

## 2021-07-20 DIAGNOSIS — R269 Unspecified abnormalities of gait and mobility: Secondary | ICD-10-CM | POA: Insufficient documentation

## 2021-07-20 DIAGNOSIS — I471 Supraventricular tachycardia: Secondary | ICD-10-CM | POA: Diagnosis not present

## 2021-07-20 DIAGNOSIS — R002 Palpitations: Secondary | ICD-10-CM

## 2021-07-20 NOTE — Telephone Encounter (Signed)
Transition Care Management Unsuccessful Follow-up Telephone Call  Date of discharge and from where:  07/16/2021  [facility]  Attempts:  2nd Attempt  Reason for unsuccessful TCM follow-up call:  placed call to patient, woman answered. Reviewed reason for my call and she refused to verify HIPPA.    Verbalized it was her right to refuse.  Encouraged patient to call her MD for any problems. Call ended.  Tomasa Rand, RN, BSN, CEN [facility] [employer] [phone]

## 2021-07-20 NOTE — Therapy (Signed)
[place] [facility]IN [facility] [address] [place], [place], [address] Phone: [phone]   Fax:  [phone]  Physical Therapy Treatment  Patient Details  Name: Michelle Armstrong MRN: [id number] Date of Birth: 10-May-1961 Referring Provider (PT): Hildred Alamin PA   Encounter Date: 07/20/2021   PT End of Session - 07/20/21 1139     Visit Number 3    Number of Visits 16    Date for PT Re-Evaluation 09/05/21    Authorization Type 1/10 eval 07/11/21    PT Start Time 0801    PT Stop Time 0845    PT Time Calculation (min) 44 min    Equipment Utilized During Treatment Gait belt;Other (comment)   Left walking boot   Activity Tolerance Patient tolerated treatment well;No increased pain    Behavior During Therapy [facility] for tasks assessed/performed             Past Medical History:  Diagnosis Date   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 02/2010 Echo: EF 55-60%, no rwma.   Vitamin D deficiency    low    Past Surgical History:  Procedure Laterality Date   ABDOMINAL HYSTERECTOMY  1999   Total lap hyst BSO due to endometriosis   CARDIAC ELECTROPHYSIOLOGY MAPPING AND ABLATION  2011   COLONOSCOPY WITH PROPOFOL N/A 07/10/2015   Procedure: COLONOSCOPY WITH PROPOFOL;  Surgeon: Lollie Sails, MD;  Location: [facility] ENDOSCOPY;  Service: Endoscopy;  Laterality: N/A;   GALLBLADDER SURGERY     HERNIA REPAIR  12/2010   LEFT HEART CATH AND CORONARY ANGIOGRAPHY N/A 02/24/2017    Procedure: Left Heart Cath and Coronary Angiography;  Surgeon: Wellington Hampshire, MD;  Location: [place] CV LAB;  Service: Cardiovascular;  Laterality: N/A;   TONSILLECTOMY      There were no vitals filed for this visit.   Subjective Assessment - 07/20/21 1119     Subjective Patient reports she treated and released from hosptial last week and sustained a heart attack. She reports issues with her heart before but nothing like that. She reports some chest soreness but nothing like it was. States she has been compliant with HEP prescribed in previous PT sessions and she presents with her husband today describing ongoing Left LE swelling, pain in left foot/LE up into knee and hip region along with continued LBP from compression fracture. She also reports one of her main issues is that she really wants to be able to get in shower but so far has been unsuccessful due to difficulty getting in/out of shower.    Patient is accompained by: Family member   Patient husbandOllen Gross   Pertinent History 60 yo Female s/p fall with subsequent closed non-displaced segmental fracture of left fibula on 05/17/21. She fell coming out of a Restaurant, stepping off the curb. She was casted for 6 weeks and has now  been given a walking boot which she will wear for 6 weeks.  MD order states, "May gradually advance weight bearing as tolerated in the boot. Once full weightbearing, may wean out of boot" She also suffered an L1 compression fracture (25% loss of height). She reports orthopedic MD has not evaluated her ankle. She has a referral to [facility] to evaluate ankle later today. 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 As far as she is aware she has not had a clot at this point. She also has osteopenia. Pt is unable to don/doff boot independently due to difficulty bending over, husband helps her; Pt reports increased back pain with difficulty getting in/out of bed and  has since been sleeping and living in a lift chair. PMH significant: Clotting disorder, HLD, hypothyroidism, Osteopenia, SVT, vitamin D deficiency    Limitations Sitting;Lifting;Standing;Walking;House hold activities    How long can you stand comfortably? Unsure- hasn't started weight bearing in LLE at this time    How long can you walk comfortably? limited- hasn't started weight bearing in LLE; will sit on knee scooter and scoot around short distances as needed.    Diagnostic tests 10/2: MRI of lumbar spine: Evolving subacute compression fracture involving the superior  endplate of L1 with mild 25% height loss and trace 2 mm bony  retropulsion. No associated stenosis.  2. Small left foraminal disc protrusion with annular fissure at  L4-5, closely approximating and potentially irritating the exiting  left L4 nerve root.  3. Mild-to-moderate facet hypertrophy at L3-4 through L5-S1. X-ray of LLE: 1.  Mildly displaced oblique extra-articular fracture of the proximal left fibular diaphysis.   2.  Nondisplaced left lateral malleolar fracture.   3.  There is no significant osteoarthrosis.    Currently in Pain? Yes    Pain Location Ankle    Pain Orientation Left;Distal;Other (Comment)   Heel- plantar surface   Pain Descriptors / Indicators Aching;Sharp;Tingling    Pain Type Chronic pain    Pain Radiating Towards Radiates up above the knee    Pain Onset More than a month ago    Pain Frequency Intermittent    Multiple Pain Sites Yes    Pain Location Back    Pain Orientation Lower    Pain Descriptors / Indicators Aching    Pain Type Chronic pain    Pain Onset More than a month ago    Pain Frequency Intermittent    Pain Onset More than a month ago            INTERVENTIONS:   Measured Vitals: 96/61 mmHg  HR= 82 bpm   Therapeutic Exercises:   Measured ankle ROM: Left DF= lacking 4 deg from neutral; PF= 21 deg  Reviewed ankle DF/PF/EV/IV- 10 reps with VC on how to perform. Reviewed using  supportive surface (leg rest portion of lift chair) to help stabilize entire leg so motion comes from ankle and not upper leg/hip.   Briefly reviewed existing HEP and patient able to verbalize and demo - Gluteal sets, Hip march, seated knee ext, and ankle ROM. Added that she could place foot on floor to perform ankle DF/PF as tolerated as well as add quad sets in long sitting while in chair. Patient verbalized understanding.   Observed left foot/ankle- Appropriate bruising (purple/blue just distal to lateral Malleolus)- Instructed patient to keep elevated if noticeable swelling.   Standing in // bars:  -Static stand without UE support -  attempting to place up to 50% WB through each leg. Observed patient placing less than 50% yet very motivated to try and keep pushing herself despite soreness.  -Attempted gentle weight shifting left to right - x 10-15 reps each side with light Fingertip touch on support bar. Patient described soreness in both hips and some tingling into left foot but able to comlete.  - Attempted staggered standing- with gentle A/P weightshifting x 10 - Patient able to perform well with VC and visual demo today.   Gait in // bars: At end of session - patient ambulated length of // bars with close w/c follow (husband asssisted with w/c)- CGA using gait belt and patient able to utilize B rails and walk with short reciprocal steps without report of significant increased pain.   Education provided throughout session via VC/TC and demonstration to facilitate movement at target joints and correct muscle activation for all exercises performed.    Clinical Impression: Patient returns to clinic today highly motivated and verbalized her desire to walk and be able to take a shower. She presents with good pain control in ankle yet was sore with standing in her knees/hip/back. She was able to tolerate the session well and presents with good understanding of her current home program. She was able  stand better today with improving weight bearing and at end of session able to walk in bars without hopping today exhibiting some reciprocal steps. The pt will benefit from further skilled PT to continue to increase LE strength, ROM, and weightbearing through LLE in order to return pt to PLOF                        PT Education - 07/20/21 1138     Education Details Ankle ROM education; Weight bearing ed    Person(s) Educated Patient;Spouse    Methods Explanation;Demonstration;Tactile cues;Verbal cues    Comprehension Verbalized understanding;Returned demonstration;Verbal cues required;Need further instruction;Tactile cues required              PT Short Term Goals - 07/11/21 1443       PT SHORT TERM GOAL #1   Title Patient will be independent in home exercise program to improve strength/mobility for better functional independence with ADLs.    Baseline 10/26: HEP given    Time 4    Period Weeks    Status New    Target Date 08/08/21      PT SHORT TERM GOAL #2   Title Patient will report a worse VAS of 4/10 in LLE for improved tolerance of mobility and quality of life.    Time 4    Period Weeks    Status New    Target Date 08/08/21               PT Long Term Goals - 07/11/21 1444       PT LONG TERM GOAL #1   Title Patient will increase FOTO score to equal to or greater than 47%    to demonstrate statistically significant improvement in mobility and quality of life.    Baseline 10/26: 5%    Time 8    Period Weeks    Status New    Target Date 09/05/21      PT LONG TERM GOAL #2   Title Patient will increase 10 meter walk test to >1.23m/s as to improve gait speed for better community ambulation and to reduce fall risk.    Baseline 10/26: unable  to ambulate at this time    Time 8    Period Weeks    Status New    Target Date 09/05/21      PT LONG TERM GOAL #3   Title Patient will increase BLE gross strength to 4+/5 as to improve functional  strength for independent gait, increased standing tolerance and increased ADL ability.    Baseline 10/26: see note    Time 8    Period Weeks    Status New    Target Date 09/05/21      PT LONG TERM GOAL #4   Title Patient will tolerate weightbearing for >30 minutes for increased mobility and ambulation to increase indepenence with ADLs and iADLs    Baseline 10/26; able to tolerate 5 seconds    Time 8    Period Weeks    Status New    Target Date 09/05/21      PT LONG TERM GOAL #5   Title Patient will increase ankle ROM to 10 degrees within normal range for improved gait mechanics and reduced pain.    Baseline 10/26: see note    Time 8    Period Weeks    Status New    Target Date 09/05/21                   Plan - 07/20/21 1140     Personal Factors and Comorbidities Age;Comorbidity 3+;Past/Current Experience;Sex;Time since onset of injury/illness/exacerbation;Transportation    Comorbidities HLD, hypothyroidism, ostepenia, osteoporis, SVT, clotting disorder    Examination-Activity Limitations Bathing;Bed Mobility;Bend;Caring for [employer];Locomotion Level;Lift;Hygiene/Grooming;Squat;Stairs;Stand;Toileting;Transfers    Examination-Participation Restrictions Church;Cleaning;Community Activity;Driving;Interpersonal Relationship;Personal Finances;Meal Prep;Laundry;Shop;Volunteer;Yard Work;Occupation    Stability/Clinical Decision Making Evolving/Moderate complexity    Rehab Potential Fair    PT Frequency 2x / week    PT Duration 8 weeks    PT Treatment/Interventions ADLs/Self Care Home Management;Aquatic Therapy;Biofeedback;Cryotherapy;Electrical Stimulation;Iontophoresis 4mg /ml Dexamethasone;Moist Heat;Ultrasound;DME Instruction;Gait training;Stair training;Functional mobility training;Neuromuscular re-education;Cognitive remediation;Balance training;Therapeutic exercise;Therapeutic activities;Patient/family education;Orthotic  Fit/Training;Manual techniques;Compression bandaging;Manual lymph drainage;Scar mobilization;Passive range of motion;Energy conservation;Splinting;Taping;Vasopneumatic Device;Visual/perceptual remediation/compensation    PT Next Visit Plan transfer training with walker,  WB activities, increase LE strength and ROM. Continue POC as previously indicated    PT Home Exercise Plan no updates currently    Consulted and Agree with Plan of Care Patient             Patient will benefit from skilled therapeutic intervention in order to improve the following deficits and impairments:  Abnormal gait, Cardiopulmonary status limiting activity, Decreased activity tolerance, Decreased balance, Decreased knowledge of precautions, Decreased endurance, Decreased knowledge of use of DME, Decreased range of motion, Decreased mobility, Difficulty walking, Decreased strength, Hypomobility, Increased edema, Impaired flexibility, Impaired perceived functional ability, Increased muscle spasms, Improper body mechanics, Pain  Visit Diagnosis: Abnormality of gait and mobility  Difficulty in walking, not elsewhere classified  Muscle weakness (generalized)  Unsteadiness on feet     Problem List Patient Active Problem List   Diagnosis Date Noted   NSTEMI (non-ST elevated myocardial infarction) ([place]) 07/14/2021   Pulmonary nodule less than 6 mm determined by computed tomography of lung 07/14/2021   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 tract infection 04/19/2021   Hepatic hemangioma 06/02/2020   Constipation 06/02/2020   Left ankle sprain 03/25/2018   Foot injury, left, initial encounter 03/25/2018  Osteoporosis of lumbar spine 02/19/2018   Subcutaneous nodule 09/16/2017   Pain and swelling of lower leg 09/04/2017   [place]  09/04/2017   Pain of both hip joints 06/03/2017   Need for hepatitis C screening test 05/22/2017   SVT s/p ablation 01/09/2010    Lewis Moccasin, PT 07/20/2021, 12:12 PM  [place] [address] [place], [place], [address] Phone: [phone]   Fax:  [phone]  Name: Michelle Armstrong MRN: [id number] Date of Birth: 06-Feb-1961

## 2021-07-25 ENCOUNTER — Ambulatory Visit: Payer: Managed Care, Other (non HMO)

## 2021-07-25 ENCOUNTER — Other Ambulatory Visit: Payer: Self-pay

## 2021-07-25 DIAGNOSIS — R269 Unspecified abnormalities of gait and mobility: Secondary | ICD-10-CM

## 2021-07-25 DIAGNOSIS — M6281 Muscle weakness (generalized): Secondary | ICD-10-CM

## 2021-07-25 DIAGNOSIS — R2681 Unsteadiness on feet: Secondary | ICD-10-CM

## 2021-07-25 NOTE — Therapy (Signed)
[place] MAIN [facility] SERVICES [address] [place], [place], [address] Phone: [phone]   Fax:  [phone]  Physical Therapy Treatment  Patient Details  Name: Michelle Armstrong MRN: [id number] Date of Birth: Jun 21, 1961 Referring Provider (PT): Hildred Alamin PA   Encounter Date: 07/25/2021   PT End of Session - 07/25/21 0855     Visit Number 4    Number of Visits 16    Date for PT Re-Evaluation 09/05/21    Authorization Type 4/10 eval 07/11/21    PT Start Time 0800    PT Stop Time 0844    PT Time Calculation (min) 44 min    Equipment Utilized During Treatment Gait belt;Other (comment)   Left walking boot   Activity Tolerance Patient tolerated treatment well;No increased pain    Behavior During Therapy [facility] for tasks assessed/performed             Past Medical History:  Diagnosis Date   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 02/2010 Echo: EF 55-60%, no rwma.   Vitamin D deficiency    low    Past Surgical History:  Procedure Laterality Date   ABDOMINAL HYSTERECTOMY  1999   Total lap hyst BSO due to endometriosis   CARDIAC ELECTROPHYSIOLOGY MAPPING AND ABLATION  2011   COLONOSCOPY WITH PROPOFOL N/A 07/10/2015   Procedure: COLONOSCOPY WITH PROPOFOL;  Surgeon: Lollie Sails, MD;  Location: [facility] ENDOSCOPY;  Service: Endoscopy;  Laterality: N/A;   GALLBLADDER SURGERY     HERNIA REPAIR  12/2010   LEFT HEART CATH AND CORONARY ANGIOGRAPHY N/A 02/24/2017    Procedure: Left Heart Cath and Coronary Angiography;  Surgeon: Wellington Hampshire, MD;  Location: [place] CV LAB;  Service: Cardiovascular;  Laterality: N/A;   TONSILLECTOMY      There were no vitals filed for this visit.   Subjective Assessment - 07/25/21 0854     Subjective Patient presents with husband to PT today. Has been compliant with HEP. Has pain in her left foot, and hip.    Patient is accompained by: Family member   Patient husbandOllen Gross   Pertinent History 60 yo Female s/p fall with subsequent closed non-displaced segmental fracture of left fibula on 05/17/21. She fell coming out of a Restaurant, stepping off the curb. She was casted for 6 weeks and has now been given a walking boot which she will wear for 6 weeks.  MD order states, "May gradually advance weight bearing as tolerated in the boot. Once full weightbearing, may wean out of boot" She also suffered an L1 compression fracture (25% loss of height). She reports orthopedic MD has not evaluated her ankle. She has a referral to [facility] to evaluate ankle later today. 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 As far as she is aware  she has not had a clot at this point. She also has osteopenia. Pt is unable to don/doff boot independently due to difficulty bending over, husband helps her; Pt reports increased back pain with difficulty getting in/out of bed and has since been sleeping and living in a lift chair. PMH significant: Clotting disorder, HLD, hypothyroidism, Osteopenia, SVT, vitamin D deficiency    Limitations Sitting;Lifting;Standing;Walking;House hold activities    How long can you stand comfortably? Unsure- hasn't started weight bearing in LLE at this time    How long can you walk comfortably? limited- hasn't started weight bearing in LLE; will sit on knee scooter and scoot around short distances as needed.    Diagnostic tests 10/2: MRI of lumbar spine: Evolving  subacute compression fracture involving the superior  endplate of L1 with mild 25% height loss and trace 2 mm bony  retropulsion. No associated stenosis.  2. Small left foraminal disc protrusion with annular fissure at  L4-5, closely approximating and potentially irritating the exiting  left L4 nerve root.  3. Mild-to-moderate facet hypertrophy at L3-4 through L5-S1. X-ray of LLE: 1.  Mildly displaced oblique extra-articular fracture of the proximal left fibular diaphysis.   2.  Nondisplaced left lateral malleolar fracture.   3.  There is no significant osteoarthrosis.    Currently in Pain? Yes    Pain Score --   doesnt give number   Pain Location Ankle    Pain Orientation Left    Pain Descriptors / Indicators Aching;Sharp    Pain Type Chronic pain    Pain Onset More than a month ago                  Treatment:  BP at start of session 104/68  In // bars: Standing weight shifts onto affected LLE cue for sequencing; shifting of hips for acceptance patient reports pain in hips and leg. 10x; heavy BUE support  Standing with RLE on green dynadisc pad cue for weight shift onto LLE 30 seconds x2 trials; heavy BUE support  Lateral stepping 6x each direction with heavy BUE support   Ambulate with RW and wheelchair follow, cues for sequencing of walker, to push walker further in front of patient due to preference for keeping it close to body. Patient ambulates 38 ft and then an additional 59 ft.   Stand pivot transfer to/from table with mod I for set up.   On mat table: Long sit position: -4 way ankle with YTB 10x each direction, inversion most painful per patient report: towel placed under knee for reduced pain in knee -quad set 3 second holds 10x -toe abduction LLE 10x   Sitting on edge of table: -lateral step/IR/ER from one washcloth to another 10x -on dynadisc: hamstring isometric 10x 3 second holds; pf/df rocking 10x   Pt educated throughout session about proper posture and  technique with exercises. Improved exercise technique, movement at target joints, use of target muscles after min to mod verbal, visual, tactile cues.   Patient tolerated increased weightbearing this session. She is able to ambulate short distances with CGA and w/c follow. Strengthening interventions tolerated well with manageable pain increase. Patient is limited by pain and fear of pain in standing but remains highly motivated for progression. The pt will benefit from further skilled PT to continue to increase LE strength, ROM, and weightbearing through LLE in order to return pt to PLOF.  PT Education - 07/25/21 0855     Education Details weight bearing, ambulation, strengthening    Person(s) Educated Patient;Spouse    Methods Explanation;Demonstration;Tactile cues;Verbal cues    Comprehension Verbalized understanding;Returned demonstration;Verbal cues required;Tactile cues required              PT Short Term Goals - 07/11/21 1443       PT SHORT TERM GOAL #1   Title Patient will be independent in home exercise program to improve strength/mobility for better functional independence with ADLs.    Baseline 10/26: HEP given    Time 4    Period Weeks    Status New    Target Date 08/08/21      PT SHORT TERM GOAL #2   Title Patient will report a worse VAS of 4/10 in LLE for improved tolerance of mobility and quality of life.    Time 4    Period Weeks    Status New    Target Date 08/08/21               PT Long Term Goals - 07/11/21 1444       PT LONG TERM GOAL #1   Title Patient will increase FOTO score to equal to or greater than 47%    to demonstrate statistically significant improvement in mobility and quality of life.    Baseline 10/26: 5%    Time 8    Period Weeks    Status New    Target Date 09/05/21      PT LONG TERM GOAL #2   Title Patient will increase 10 meter walk test to >1.22m/s as to improve gait speed for better  community ambulation and to reduce fall risk.    Baseline 10/26: unable to ambulate at this time    Time 8    Period Weeks    Status New    Target Date 09/05/21      PT LONG TERM GOAL #3   Title Patient will increase BLE gross strength to 4+/5 as to improve functional strength for independent gait, increased standing tolerance and increased ADL ability.    Baseline 10/26: see note    Time 8    Period Weeks    Status New    Target Date 09/05/21      PT LONG TERM GOAL #4   Title Patient will tolerate weightbearing for >30 minutes for increased mobility and ambulation to increase indepenence with ADLs and iADLs    Baseline 10/26; able to tolerate 5 seconds    Time 8    Period Weeks    Status New    Target Date 09/05/21      PT LONG TERM GOAL #5   Title Patient will increase ankle ROM to 10 degrees within normal range for improved gait mechanics and reduced pain.    Baseline 10/26: see note    Time 8    Period Weeks    Status New    Target Date 09/05/21                   Plan - 07/25/21 0856     Clinical Impression Statement Patient tolerated increased weightbearing this session. She is able to ambulate short distances with CGA and w/c follow. Strengthening interventions tolerated well with manageable pain increase. Patient is limited by pain and fear of pain in standing but remains highly motivated for progression. The pt will benefit from further skilled PT to continue to increase LE strength,  ROM, and weightbearing through LLE in order to return pt to PLOF.    Personal Factors and Comorbidities Age;Comorbidity 3+;Past/Current Experience;Sex;Time since onset of injury/illness/exacerbation;Transportation    Comorbidities HLD, hypothyroidism, ostepenia, osteoporis, SVT, clotting disorder    Examination-Activity Limitations Bathing;Bed Mobility;Bend;Caring for [employer];Locomotion  Level;Lift;Hygiene/Grooming;Squat;Stairs;Stand;Toileting;Transfers    Examination-Participation Restrictions Church;Cleaning;Community Activity;Driving;Interpersonal Relationship;Personal Finances;Meal Prep;Laundry;Shop;Volunteer;Yard Work;Occupation    Stability/Clinical Decision Making Evolving/Moderate complexity    Rehab Potential Fair    PT Frequency 2x / week    PT Duration 8 weeks    PT Treatment/Interventions ADLs/Self Care Home Management;Aquatic Therapy;Biofeedback;Cryotherapy;Electrical Stimulation;Iontophoresis 4mg /ml Dexamethasone;Moist Heat;Ultrasound;DME Instruction;Gait training;Stair training;Functional mobility training;Neuromuscular re-education;Cognitive remediation;Balance training;Therapeutic exercise;Therapeutic activities;Patient/family education;Orthotic Fit/Training;Manual techniques;Compression bandaging;Manual lymph drainage;Scar mobilization;Passive range of motion;Energy conservation;Splinting;Taping;Vasopneumatic Device;Visual/perceptual remediation/compensation    PT Next Visit Plan transfer training with walker,  WB activities, increase LE strength and ROM. Continue POC as previously indicated    PT Home Exercise Plan no updates currently    Consulted and Agree with Plan of Care Patient             Patient will benefit from skilled therapeutic intervention in order to improve the following deficits and impairments:  Abnormal gait, Cardiopulmonary status limiting activity, Decreased activity tolerance, Decreased balance, Decreased knowledge of precautions, Decreased endurance, Decreased knowledge of use of DME, Decreased range of motion, Decreased mobility, Difficulty walking, Decreased strength, Hypomobility, Increased edema, Impaired flexibility, Impaired perceived functional ability, Increased muscle spasms, Improper body mechanics, Pain  Visit Diagnosis: Abnormality of gait and mobility  Muscle weakness (generalized)  Unsteadiness on feet     Problem  List Patient Active Problem List   Diagnosis Date Noted   NSTEMI (non-ST elevated myocardial infarction) ([place]) 07/14/2021   Pulmonary nodule less than 6 mm determined by computed tomography of lung 07/14/2021   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 tract infection 04/19/2021   Hepatic hemangioma 06/02/2020   Constipation 06/02/2020   Left ankle sprain 03/25/2018   Foot injury, left, initial encounter 03/25/2018   Osteoporosis of lumbar spine 02/19/2018   Subcutaneous nodule 09/16/2017   Pain and swelling of lower leg 09/04/2017   Nevus 09/04/2017   Pain of both hip joints 06/03/2017   Need for hepatitis C screening test 05/22/2017   SVT s/p ablation 01/09/2010    Janna Arch, PT, DPT  07/25/2021, 8:58 AM  [place] [address] [place], [place], [address] Phone: [phone]   Fax:  [phone]  Name: Michelle Armstrong MRN: [id number] Date of Birth: 1961/04/01

## 2021-07-27 ENCOUNTER — Other Ambulatory Visit: Payer: Self-pay

## 2021-07-27 ENCOUNTER — Encounter: Payer: Self-pay | Admitting: Physical Therapy

## 2021-07-27 ENCOUNTER — Ambulatory Visit: Payer: Managed Care, Other (non HMO) | Admitting: Physical Therapy

## 2021-07-27 DIAGNOSIS — R2681 Unsteadiness on feet: Secondary | ICD-10-CM

## 2021-07-27 DIAGNOSIS — R269 Unspecified abnormalities of gait and mobility: Secondary | ICD-10-CM | POA: Diagnosis not present

## 2021-07-27 DIAGNOSIS — M6281 Muscle weakness (generalized): Secondary | ICD-10-CM

## 2021-07-27 DIAGNOSIS — R2689 Other abnormalities of gait and mobility: Secondary | ICD-10-CM

## 2021-07-27 DIAGNOSIS — R262 Difficulty in walking, not elsewhere classified: Secondary | ICD-10-CM

## 2021-07-27 DIAGNOSIS — R278 Other lack of coordination: Secondary | ICD-10-CM

## 2021-07-27 NOTE — Therapy (Signed)
[place] [facility] SERVICES [address] [place], [place], [address] Phone: [phone]   Fax:  [phone]  Physical Therapy Treatment  Patient Details  Name: Michelle Armstrong MRN: [id number] Date of Birth: 1961-02-12 Referring Provider (PT): Hildred Alamin PA   Encounter Date: 07/27/2021   PT End of Session - 07/27/21 1007     Visit Number 5    Number of Visits 16    Date for PT Re-Evaluation 09/05/21    Authorization Type 4/10 eval 07/11/21    PT Start Time 0806    PT Stop Time 0846    PT Time Calculation (min) 40 min    Equipment Utilized During Treatment Gait belt;Other (comment)   Left walking boot   Activity Tolerance Patient tolerated treatment well;Patient limited by pain    Behavior During Therapy 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 02/2010 Echo: EF 55-60%, no rwma.   Vitamin D deficiency    low    Past Surgical History:  Procedure Laterality Date   ABDOMINAL HYSTERECTOMY  1999   Total lap hyst BSO due to endometriosis   CARDIAC ELECTROPHYSIOLOGY MAPPING AND ABLATION  2011   COLONOSCOPY WITH PROPOFOL N/A 07/10/2015   Procedure: COLONOSCOPY WITH PROPOFOL;  Surgeon: Lollie Sails, MD;  Location: [facility];  Service: Endoscopy;  Laterality: N/A;   GALLBLADDER SURGERY     HERNIA REPAIR  12/2010   LEFT HEART CATH AND CORONARY ANGIOGRAPHY N/A  02/24/2017   Procedure: Left Heart Cath and Coronary Angiography;  Surgeon: Wellington Hampshire, MD;  Location: [facility];  Service: Cardiovascular;  Laterality: N/A;   TONSILLECTOMY      There were no vitals filed for this visit.   Subjective Assessment - 07/27/21 1004     Subjective Patient presents with husband to PT today. States she been compliant with HEP and progressing standing time at home. She does endorse pain in L foot/ankle, B hips and low back - 3/10 at rest.    Patient is accompained by: Family member   Patient husband- [name]   Pertinent History 60 yo Female s/p fall with subsequent closed non-displaced segmental fracture of left fibula on 05/17/21. She fell coming out of a Restaurant, stepping off the curb. She was casted for 6 weeks and has now been given a walking boot which she will wear for 6 weeks.  MD order states, "May gradually advance weight bearing as tolerated in the boot. Once full weightbearing, may wean out of boot" She also suffered an L1 compression fracture (25% loss of height). She reports orthopedic MD has not evaluated her ankle. She has a referral to [facility] to evaluate ankle later today. She is going to see spine doctor Friday;  She has a mutated gene that predisposes her to clots. As far as she is aware she has not had a clot at this point. She also has osteopenia. Pt is unable to don/doff boot independently due to difficulty bending over, husband helps her; Pt reports increased back pain with difficulty getting in/out of bed and has since been sleeping and living in a lift chair. PMH significant: Clotting disorder, HLD, hypothyroidism, Osteopenia, SVT, vitamin D deficiency    Limitations Sitting;Lifting;Standing;Walking;House hold activities    How long can you stand comfortably? Unsure- hasn't started weight bearing in LLE at this time    How long can you walk comfortably? limited- hasn't started weight bearing in LLE; will sit on knee scooter and  scoot around short distances as needed.    Diagnostic tests 10/2: MRI of lumbar spine: Evolving subacute compression fracture involving the superior  endplate of L1 with mild 25% height loss and trace 2 mm bony  retropulsion. No associated stenosis.  2. Small left foraminal disc protrusion with annular fissure at  L4-5, closely approximating and potentially irritating the exiting  left L4 nerve root.  3. Mild-to-moderate facet hypertrophy at L3-4 through L5-S1. X-ray of LLE: 1.  Mildly displaced oblique extra-articular fracture of the proximal left fibular diaphysis.   2.  Nondisplaced left lateral malleolar fracture.   3.  There is no significant osteoarthrosis.    Currently in Pain? Yes    Pain Score 3     Pain Location Back    Pain Orientation Lower    Pain Onset More than a month ago    Pain Score 3    Pain Location Hip    Pain Orientation Left;Right    Pain Score 3    Pain Location Ankle    Pain Orientation Left              Treatment:  BP at start of session 98/70   In // bars: Standing lateral weight shifts; 2 x 1 minute. Heavy BUE support.   Standing lateral weight shifts with LLE on airex pad; 1 minute. Heavy BUE support.   Romberg stance on blue airex pad; 3 x 30 seconds progressing to no UE support. Multimodal cueing for upright posture; pt prefers no mirrors for visual feedback.   Static standing WBOS on firm surface; x 1 minute. No UE support. Pt rates easy. Multimodal cueing for upright posture.   Static standing NBOS on firm surface; x 1 minute. No UE support. Multimodal cueing for upright posture.   Stand pivot transfer w/c>mat table.  STS 2x10 reps from elevated EOM, no UE support.  *painful    Ambulate with RW; 2 x 75 feet with standing lean against wall as rest break between sets. Pt states she prefers not to sit for rest break.  BP at end of session: 108/68     Pt educated throughout session about proper posture and technique with exercises. Improved  exercise technique, movement at target joints, use of target muscles after min to mod verbal, visual, tactile cues.   Pt was educated on importance of communication throughout session between pt and therapist in regards to level of pain in order to maximize effectiveness of treatment sessions. Pt states she does not like to grade pain using NRS of 0-10. Future sessions would benefit from alternate methods of rating pain (possibly try Arnol Mcgibbon-Wong scale).      Clinical Impression: Pt progressed time in standing/LLE weightbearing this session. She was also able to increase ambulation  distance as she continues to use RW. Throughout session, pt did require heavy UE support via RW or // bars as pt reported not yet feeling comfortable with removing UE from bars. Neuromuscular re-ed was initiated with static standing on airex pad; balance was challenged on compliant surface. Patient remains limited by pain and fear of pain however she is highly motivated to progress towards goals of independence and returning to PLOF. The pt will benefit from further skilled PT to continue to increase LE strength, ROM, and weightbearing through LLE.        PT Short Term Goals - 07/11/21 1443       PT SHORT TERM GOAL #1   Title Patient will be independent in home exercise program to improve strength/mobility for better functional independence with ADLs.    Baseline 10/26: HEP given    Time 4    Period Weeks    Status New    Target Date 08/08/21      PT SHORT TERM GOAL #2   Title Patient will report a worse VAS of 4/10 in LLE for improved tolerance of mobility and quality of life.    Time 4    Period Weeks    Status New    Target Date 08/08/21               PT Long Term Goals - 07/11/21 1444       PT LONG TERM GOAL #1   Title Patient will increase FOTO score to equal to or greater than 47%    to demonstrate statistically significant improvement in mobility and quality of life.    Baseline 10/26: 5%     Time 8    Period Weeks    Status New    Target Date 09/05/21      PT LONG TERM GOAL #2   Title Patient will increase 10 meter walk test to >1.27m/s as to improve gait speed for better community ambulation and to reduce fall risk.    Baseline 10/26: unable to ambulate at this time    Time 8    Period Weeks    Status New    Target Date 09/05/21      PT LONG TERM GOAL #3   Title Patient will increase BLE gross strength to 4+/5 as to improve functional strength for independent gait, increased standing tolerance and increased ADL ability.    Baseline 10/26: see note    Time 8    Period Weeks    Status New    Target Date 09/05/21      PT LONG TERM GOAL #4   Title Patient will tolerate weightbearing for >30 minutes for increased mobility and ambulation to increase indepenence with ADLs and iADLs    Baseline 10/26; able to tolerate 5 seconds    Time 8    Period Weeks    Status New    Target Date 09/05/21      PT LONG TERM GOAL #5   Title Patient will increase ankle ROM to 10 degrees within normal range for improved gait mechanics and reduced pain.    Baseline 10/26: see note    Time 8    Period Weeks    Status New    Target Date 09/05/21                   Plan - 07/27/21 1159     Clinical Impression Statement Pt progressed time in standing/LLE weightbearing this session. She was also  able to increase ambulation distance as she continues to use RW. Throughout session, pt did require heavy UE support via RW or // bars as pt reported not yet feeling comfortable with removing UE from bars. Neuromuscular re-ed was initiated with static standing on airex pad; balance was challenged on compliant surface. Patient remains limited by pain and fear of pain however she is highly motivated to progress towards goals of independence and returning to PLOF. The pt will benefit from further skilled PT to continue to increase LE strength, ROM, and weightbearing through LLE.    Personal Factors  and Comorbidities Age;Comorbidity 3+;Past/Current Experience;Sex;Time since onset of injury/illness/exacerbation;Transportation    Comorbidities HLD, hypothyroidism, ostepenia, osteoporis, SVT, clotting disorder    Examination-Activity Limitations Bathing;Bed Mobility;Bend;Caring for [employer];Locomotion Level;Lift;Hygiene/Grooming;Squat;Stairs;Stand;Toileting;Transfers    Examination-Participation Restrictions Church;Cleaning;Community Activity;Driving;Interpersonal Relationship;Personal Finances;Meal Prep;Laundry;Shop;Volunteer;Yard Work;Occupation    Stability/Clinical Decision Making Evolving/Moderate complexity    Rehab Potential Fair    PT Frequency 2x / week    PT Duration 8 weeks    PT Treatment/Interventions ADLs/Self Care Home Management;Aquatic Therapy;Biofeedback;Cryotherapy;Electrical Stimulation;Iontophoresis 4mg /ml Dexamethasone;Moist Heat;Ultrasound;DME Instruction;Gait training;Stair training;Functional mobility training;Neuromuscular re-education;Cognitive remediation;Balance training;Therapeutic exercise;Therapeutic activities;Patient/family education;Orthotic Fit/Training;Manual techniques;Compression bandaging;Manual lymph drainage;Scar mobilization;Passive range of motion;Energy conservation;Splinting;Taping;Vasopneumatic Device;Visual/perceptual remediation/compensation    PT Next Visit Plan transfer training with walker,  WB activities, increase LE strength and ROM. Continue POC as previously indicated    PT Home Exercise Plan no updates currently    Consulted and Agree with Plan of Care Patient             Patient will benefit from skilled therapeutic intervention in order to improve the following deficits and impairments:  Abnormal gait, Cardiopulmonary status limiting activity, Decreased activity tolerance, Decreased balance, Decreased knowledge of precautions, Decreased endurance, Decreased knowledge of use of DME, Decreased range of  motion, Decreased mobility, Difficulty walking, Decreased strength, Hypomobility, Increased edema, Impaired flexibility, Impaired perceived functional ability, Increased muscle spasms, Improper body mechanics, Pain  Visit Diagnosis: Abnormality of gait and mobility  Other lack of coordination  Difficulty in walking, not elsewhere classified  Unsteadiness on feet  Muscle weakness (generalized)  Other abnormalities of gait and mobility     Problem List Patient Active Problem List   Diagnosis Date Noted   NSTEMI (non-ST elevated myocardial infarction) ([place]) 07/14/2021   Pulmonary nodule less than 6 mm determined by computed tomography of lung 07/14/2021   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 tract infection 04/19/2021   Hepatic hemangioma 06/02/2020   Constipation 06/02/2020   Left ankle sprain 03/25/2018   Foot injury, left, initial encounter 03/25/2018   Osteoporosis of lumbar spine 02/19/2018   Subcutaneous nodule 09/16/2017   Pain and swelling of lower leg 09/04/2017   Nevus 09/04/2017   Pain of both hip joints 06/03/2017   Need for hepatitis C screening test 05/22/2017   SVT s/p ablation 01/09/2010    Patrina Levering PT, DPT  [place] [address] [place], [place], [address] Phone: [phone]   Fax:  [phone]  Name: Michelle Armstrong MRN: [id number] Date of Birth: 08/05/1961

## 2021-07-30 ENCOUNTER — Ambulatory Visit: Payer: Managed Care, Other (non HMO) | Admitting: Physical Therapy

## 2021-07-30 ENCOUNTER — Other Ambulatory Visit: Payer: Self-pay

## 2021-07-30 DIAGNOSIS — M6281 Muscle weakness (generalized): Secondary | ICD-10-CM

## 2021-07-30 DIAGNOSIS — R269 Unspecified abnormalities of gait and mobility: Secondary | ICD-10-CM | POA: Diagnosis not present

## 2021-07-30 DIAGNOSIS — R2681 Unsteadiness on feet: Secondary | ICD-10-CM

## 2021-07-30 DIAGNOSIS — R262 Difficulty in walking, not elsewhere classified: Secondary | ICD-10-CM

## 2021-07-30 NOTE — Telephone Encounter (Signed)
Message sent through my chart

## 2021-07-30 NOTE — Therapy (Signed)
[place] MAIN [facility] SERVICES [address] [place], [place], [address] Phone: [phone]   Fax:  [phone]  Physical Therapy Treatment  Patient Details  Name: Michelle Armstrong MRN: [id number] Date of Birth: 1961/03/25 Referring Provider (PT): Hildred Alamin PA   Encounter Date: 07/30/2021   PT End of Session - 07/30/21 1020     Visit Number 6    Number of Visits 16    Date for PT Re-Evaluation 09/05/21    Authorization Type 4/10 eval 07/11/21    PT Start Time 0803    PT Stop Time 5885    PT Time Calculation (min) 44 min    Equipment Utilized During Treatment Gait belt;Other (comment)    Activity Tolerance Patient tolerated treatment well    Behavior During Therapy [facility] for tasks assessed/performed             Past Medical History:  Diagnosis Date   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 02/2010 Echo: EF 55-60%, no rwma.   Vitamin D deficiency    low    Past Surgical History:  Procedure Laterality Date   ABDOMINAL HYSTERECTOMY  1999   Total lap hyst BSO due to endometriosis   CARDIAC ELECTROPHYSIOLOGY MAPPING AND ABLATION  2011   COLONOSCOPY WITH PROPOFOL N/A 07/10/2015   Procedure: COLONOSCOPY WITH PROPOFOL;  Surgeon: Lollie Sails, MD;  Location: [facility] ENDOSCOPY;  Service: Endoscopy;  Laterality: N/A;   GALLBLADDER SURGERY     HERNIA REPAIR  12/2010   LEFT HEART CATH AND CORONARY ANGIOGRAPHY N/A 02/24/2017   Procedure: Left Heart Cath and  Coronary Angiography;  Surgeon: Wellington Hampshire, MD;  Location: [place] CV LAB;  Service: Cardiovascular;  Laterality: N/A;   TONSILLECTOMY      There were no vitals filed for this visit.   Subjective Assessment - 07/30/21 0823     Subjective Pt presents with husband to PT session today. Pt and husband report no changes in medication since last visit and no significant events (falls, loss of balance, heart issues, etc) have occurred at home since previous session. Pt does report continued back, hip and foot pain and reports she feels back pain and hip pain seem to be the most limiting in her ability to ambulate prolonged distances.    Patient is accompained by: Family member    Pertinent History 60 yo Female s/p fall with subsequent closed non-displaced segmental fracture of left fibula on 05/17/21. Pt reports she sustained her initial injury when she was leaving a resturaunt. She reprots she was stepping onto an outdoor carpeted surface which had a unmarked, large indentation beneath the carpet which could not be noticed. When she stepped into this area, her ankle turned and resulted in the fracture and fall. She was casted for 6 weeks and has now been given a walking boot which she will wear for  6 weeks. MD order states, "May gradually advance weight bearing as tolerated in the boot. Once full weightbearing, may wean out of boot" She also suffered an L1 compression fracture (25% loss of height). She reports orthopedic MD has not evaluated her ankle. She has a referral to [facility] to evaluate ankle later today. 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 As far as she is aware she has not had a clot at this point. She also has osteopenia. Pt is unable to don/doff boot independently due to difficulty bending over, husband helps her; Pt reports increased back pain with difficulty getting in/out of bed and has since been sleeping and living in a  lift chair. PMH significant: Clotting disorder, HLD, hypothyroidism, Osteopenia, SVT, vitamin D deficiency    Limitations Sitting;Lifting;Standing;Walking;House hold activities    How long can you stand comfortably? Unsure- hasn't started weight bearing in LLE at this time    How long can you walk comfortably? limited, walkes with walker at home and in clinic. Uses whelchair for longer distance ambulatory tasks.    Diagnostic tests 10/2: MRI of lumbar spine: Evolving subacute compression fracture involving the superior  endplate of L1 with mild 25% height loss and trace 2 mm bony  retropulsion. No associated stenosis.  2. Small left foraminal disc protrusion with annular fissure at  L4-5, closely approximating and potentially irritating the exiting  left L4 nerve root.  3. Mild-to-moderate facet hypertrophy at L3-4 through L5-S1. X-ray of LLE: 1.  Mildly displaced oblique extra-articular fracture of the proximal left fibular diaphysis.   2.  Nondisplaced left lateral malleolar fracture.   3.  There is no significant osteoarthrosis.    Patient Stated Goals Pt wants to be able to walk normally again without pain and with normalized gait pattern.    Currently in Pain? Yes    Pain Score 4     Pain Location Ankle    Pain Type Chronic pain    Pain Onset More than a month ago    Pain Score 4    Pain Location Hip    Pain Orientation Left;Right    Pain Radiating Towards midline    Pain Onset More than a month ago    Aggravating Factors  worse with standing and walking tasks    Pain Score 4    Pain Location Back    Pain Orientation Posterior;Lower    Pain Type Chronic pain    Pain Onset More than a month ago              Treatment provided this session  Therex:   Pt doffed CAM walker boot  Patient instructed in ankle ABCs and also instructed she could perform ankle ABCs from A to Z in order to improve range of motion she was reaching with her foot.  She did report this was more difficult  than her previous ABC exercise was instructed to complete at as such at home with 2-3 cycles through alphabet as she could tolerate. -pt completed exercise with knee elevated and extended with entire LE (with exception of ankle) resting on mat table.   Pt donned cam walker boot with some assistance from her husband     Neuro Re- Ed:   In // bars:   Standing lateral weight shifts; 1 x 1 minute.  -Pt attempted one repetition without UE support and experienced some pain.  -Cues for progressive minimalization of UE support.  Standing A/P weight shifts, cues for progressive minimilization of UE support    Standing on airex pad with feet shoulder width, no HH support, no swaying of LOB noted. X 1 min -same activity with head turns, no LOB noted,   Unless otherwise stated, SBA was provided for the above activities and gait belt donned in order to ensure pt safety    There Act:  Patient began ambulatory bout seated in wheelchair.  Patient performed sit to stand transfer and ambulated 80 feet total with rolling walker and step to gait pattern.  Patient took short standing rest at approximately 45 feet and ambulatory bout.  At end ambulatory about patient return to wheelchair and took seated rest break -At this time patient was provided with instruction and gait pattern in order to improve efficiency with gait and walking with walker.  Patient also instructed not to expect gait pattern to be perfect on first attempt and with increased practice efficacy would improve. Patient perform sit to stand transfer and patient ambulated 80 feet and with increased practice patient began to demonstrate improved gait pattern was able to perform step through gait pattern with improved efficacy.  Patient instructed to continue to practice this in the home environment but to be careful to not take too big of steps with the right lower extremity.  At end of ambulatory bout patient rested and seated on mat table.    Following therex patient was educated regarding safe way to ascend and descend steps with her injury.    Following education patient performed sit to stand transfer and ambulated to steps in therapy gym approximately 26 feet.  Patient performed 2 repetitions ascending and descending 4 steps utilizing upper extremity on rails.  On first repetition of ascending descending patient required minimal cues for reminders of which lower extremity should lead.  On second attempt patient did not require cues and demonstrated improved efficacy with this task.  Patient had slightly more difficulty with ascending versus descending steps but efficacy did improve with practice with both ascending and descending.  Following stair training patient ambulated additional 28 feet to her wheelchair where she was seated and session ended.    At end of session all questions answered regarding progression of exercises and treatment session.              Note: Portions of this document were prepared using Dragon voice recognition software and although reviewed may contain unintentional dictation errors in syntax, grammar, or spelling.               PT Education - 07/30/21 1018     Education Details Stair training (up with leading with "good" LE, down leading with "bad" LE    Person(s) Educated Patient    Methods Explanation;Demonstration    Comprehension Verbalized understanding;Returned demonstration              PT Short Term Goals - 07/11/21 1443       PT SHORT TERM GOAL #1   Title Patient will be independent in home exercise program to improve strength/mobility for better functional independence with ADLs.    Baseline 10/26: HEP given    Time 4    Period Weeks    Status New    Target Date 08/08/21      PT SHORT TERM GOAL #2   Title Patient will report a worse VAS of 4/10 in LLE for improved tolerance of mobility and quality of life.    Time 4  Period Weeks    Status New     Target Date 08/08/21               PT Long Term Goals - 07/11/21 1444       PT LONG TERM GOAL #1   Title Patient will increase FOTO score to equal to or greater than 47%    to demonstrate statistically significant improvement in mobility and quality of life.    Baseline 10/26: 5%    Time 8    Period Weeks    Status New    Target Date 09/05/21      PT LONG TERM GOAL #2   Title Patient will increase 10 meter walk test to >1.110m/s as to improve gait speed for better community ambulation and to reduce fall risk.    Baseline 10/26: unable to ambulate at this time    Time 8    Period Weeks    Status New    Target Date 09/05/21      PT LONG TERM GOAL #3   Title Patient will increase BLE gross strength to 4+/5 as to improve functional strength for independent gait, increased standing tolerance and increased ADL ability.    Baseline 10/26: see note    Time 8    Period Weeks    Status New    Target Date 09/05/21      PT LONG TERM GOAL #4   Title Patient will tolerate weightbearing for >30 minutes for increased mobility and ambulation to increase indepenence with ADLs and iADLs    Baseline 10/26; able to tolerate 5 seconds    Time 8    Period Weeks    Status New    Target Date 09/05/21      PT LONG TERM GOAL #5   Title Patient will increase ankle ROM to 10 degrees within normal range for improved gait mechanics and reduced pain.    Baseline 10/26: see note    Time 8    Period Weeks    Status New    Target Date 09/05/21                   Plan - 07/30/21 1022     Clinical Impression Statement Pt presents to therapy with great motivation for comletion of therapy program. Pt also has good motivation for improvement in walking and is welcome to constructive feedback to facilitate an efficient recovery process. Pt prefers to utilize numerical rating pain scale and agrees to inform treating PT if pain rating increases beyond 6-7/10 so treatment can be adapted  accordingly. Pt was instructed in step to and through patterns of gait and ways to progress these to improve her ability to ambulate efficiently. Pt demonstrated ipmroved gait mechanics at end of session following instruction and practice. Continued practice and instruction will contue to be beneficial in future sessions. Pt will continue to benefit from skilled PT intevention in order to improve her function, strength, range of motion, ambulatory capacity and her QOL.    Personal Factors and Comorbidities Age;Comorbidity 3+;Past/Current Experience;Sex;Time since onset of injury/illness/exacerbation;Transportation    Comorbidities HLD, hypothyroidism, ostepenia, osteoporis, SVT, clotting disorder    Examination-Activity Limitations Bathing;Bed Mobility;Bend;Caring for [employer];Locomotion Level;Lift;Hygiene/Grooming;Squat;Stairs;Stand;Toileting;Transfers    Examination-Participation Restrictions Church;Cleaning;Community Activity;Driving;Interpersonal Relationship;Personal Finances;Meal Prep;Laundry;Shop;Volunteer;Yard Work;Occupation    Stability/Clinical Decision Making Evolving/Moderate complexity    Rehab Potential Fair    PT Frequency 2x / week    PT Duration 8 weeks    PT Treatment/Interventions ADLs/Self Care  Home Management;Aquatic Therapy;Biofeedback;Cryotherapy;Electrical Stimulation;Iontophoresis 4mg /ml Dexamethasone;Moist Heat;Ultrasound;DME Instruction;Gait training;Stair training;Functional mobility training;Neuromuscular re-education;Cognitive remediation;Balance training;Therapeutic exercise;Therapeutic activities;Patient/family education;Orthotic Fit/Training;Manual techniques;Compression bandaging;Manual lymph drainage;Scar mobilization;Passive range of motion;Energy conservation;Splinting;Taping;Vasopneumatic Device;Visual/perceptual remediation/compensation    PT Next Visit Plan transfer training with walker,  WB activities, increase LE strength and  ROM. Continue POC as previously indicated    PT Home Exercise Plan no updates currently    Consulted and Agree with Plan of Care Patient             Patient will benefit from skilled therapeutic intervention in order to improve the following deficits and impairments:  Abnormal gait, Cardiopulmonary status limiting activity, Decreased activity tolerance, Decreased balance, Decreased knowledge of precautions, Decreased endurance, Decreased knowledge of use of DME, Decreased range of motion, Decreased mobility, Difficulty walking, Decreased strength, Hypomobility, Increased edema, Impaired flexibility, Impaired perceived functional ability, Increased muscle spasms, Improper body mechanics, Pain  Visit Diagnosis: Abnormality of gait and mobility  Difficulty in walking, not elsewhere classified  Unsteadiness on feet  Muscle weakness (generalized)     Problem List Patient Active Problem List   Diagnosis Date Noted   NSTEMI (non-ST elevated myocardial infarction) ([place]) 07/14/2021   Pulmonary nodule less than 6 mm determined by computed tomography of lung 07/14/2021   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 tract infection 04/19/2021   Hepatic hemangioma 06/02/2020   Constipation 06/02/2020   Left ankle sprain 03/25/2018   Foot injury, left, initial encounter 03/25/2018   Osteoporosis of lumbar spine 02/19/2018   Subcutaneous nodule 09/16/2017   Pain and swelling of lower leg 09/04/2017   Nevus 09/04/2017   Pain of both hip joints 06/03/2017   Need for hepatitis C screening test 05/22/2017   SVT s/p ablation 01/09/2010    Particia Lather, PT 07/30/2021, 10:55 AM  [place] [address] [place], [place], [address] Phone:  [phone]   Fax:  [phone]  Name: SHERRIAN NUNNELLEY MRN: [id number] Date of Birth: 1960-10-21

## 2021-07-31 ENCOUNTER — Telehealth: Payer: Self-pay | Admitting: Family Medicine

## 2021-07-31 NOTE — Telephone Encounter (Signed)
Pt calling in regards to flu shot. She is wanting to receive a flu shot however she was takin ga 6 pack steroid of methylprednisolone. Pt took her last dose Oct 27, 22 and was wanting to know when she would be able to get a flu shot.   [phone]

## 2021-08-01 ENCOUNTER — Ambulatory Visit: Payer: Managed Care, Other (non HMO)

## 2021-08-01 ENCOUNTER — Encounter: Payer: Self-pay | Admitting: Podiatry

## 2021-08-01 ENCOUNTER — Other Ambulatory Visit: Payer: Self-pay

## 2021-08-01 ENCOUNTER — Ambulatory Visit: Payer: Managed Care, Other (non HMO) | Admitting: Podiatry

## 2021-08-01 DIAGNOSIS — R269 Unspecified abnormalities of gait and mobility: Secondary | ICD-10-CM

## 2021-08-01 DIAGNOSIS — M21611 Bunion of right foot: Secondary | ICD-10-CM

## 2021-08-01 DIAGNOSIS — R52 Pain, unspecified: Secondary | ICD-10-CM | POA: Diagnosis not present

## 2021-08-01 DIAGNOSIS — L602 Onychogryphosis: Secondary | ICD-10-CM | POA: Diagnosis not present

## 2021-08-01 DIAGNOSIS — L84 Corns and callosities: Secondary | ICD-10-CM

## 2021-08-01 DIAGNOSIS — R262 Difficulty in walking, not elsewhere classified: Secondary | ICD-10-CM

## 2021-08-01 DIAGNOSIS — L603 Nail dystrophy: Secondary | ICD-10-CM | POA: Diagnosis not present

## 2021-08-01 DIAGNOSIS — M21612 Bunion of left foot: Secondary | ICD-10-CM

## 2021-08-01 DIAGNOSIS — M6281 Muscle weakness (generalized): Secondary | ICD-10-CM

## 2021-08-01 DIAGNOSIS — R2681 Unsteadiness on feet: Secondary | ICD-10-CM

## 2021-08-01 MED ORDER — UREA 40 % EX CREA: 1.0000 "application " | TOPICAL_CREAM | Freq: Two times a day (BID) | CUTANEOUS | 2 refills | Status: DC

## 2021-08-01 NOTE — Patient Instructions (Signed)
Look for urea 40% cream or ointment and apply to the thickened dry skin / calluses. This can be bought over the counter, at a pharmacy or online such as [employer].  

## 2021-08-01 NOTE — Progress Notes (Signed)
  Subjective:  Patient ID: Michelle Armstrong, female    DOB: [birth year],  MRN: [id number]  Chief Complaint  Patient presents with   Callouses    New pt- possible callous on foot pt had a boot on from leg issues. EST pt last seen 2019    60 y.o. female presents with the above complaint. History confirmed with patient.  She had a recent left leg and ankle fracture and was in a cast and is now in a boot.  Due to the cast the skin on the left foot has become very thickened and is now peeling.  Also has large callus on the right heel and forefoot due to overloading the weight on this side from being off of the foot on the left side.  Causing some pain.  She has also not been able to trim her nails due to a compression fracture in her spine.  She also has bunions and previously had used offloading pads and asks if we have any of these today. Objective:  Physical Exam: warm, good capillary refill, no trophic changes or ulcerative lesions, normal DP and PT pulses, normal sensory exam, and elongated toenails x10, no onychomycosis, left second nail dystrophic, hyperkeratotic lesions diffusely across the forefoot bilateral, plantar heel bilateral, left foot has peeling hyperkeratotic tissue diffusely.  Assessment:   1. Callus of foot   2. Nail dystrophy   3. Bilateral bunions   4. Pain      Plan:  Patient was evaluated and treated and all questions answered.  All symptomatic hyperkeratoses were safely debrided with a sterile #15 blade to patient's level of comfort without incident. We discussed preventative and palliative care of these lesions including use of a pumice stone and lotions/creams daily.  I recommended urea cream 40% which I sent a prescription for, if not covered she will get over-the-counter.  Nails were trimmed in length and thickness using a sharp nail nipper to a comfortable and tolerable level.    Return if symptoms worsen or fail to improve.

## 2021-08-02 NOTE — Therapy (Signed)
[place] MAIN [facility] SERVICES [address] [place], [place], [address] Phone: [phone]   Fax:  [phone]  Physical Therapy Treatment  Patient Details  Name: Michelle Armstrong MRN: [id number] Date of Birth: [birth year] Referring Provider (PT): Hildred Alamin PA   Encounter Date: 08/01/2021   PT End of Session - 08/01/21 0648     Visit Number 7    Number of Visits 16    Date for PT Re-Evaluation 09/05/21    Authorization Type 4/10 eval 07/11/21    PT Start Time 0801    PT Stop Time 0814    PT Time Calculation (min) 42 min    Equipment Utilized During Treatment Other (comment)    Activity Tolerance Patient tolerated treatment well    Behavior During Therapy [facility] for tasks assessed/performed             Past Medical History:  Diagnosis Date   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 02/2010 Echo: EF 55-60%, no rwma.   Vitamin D deficiency    low    Past Surgical History:  Procedure Laterality Date   ABDOMINAL HYSTERECTOMY  1999   Total lap hyst BSO due to endometriosis   CARDIAC ELECTROPHYSIOLOGY MAPPING AND ABLATION  2011   COLONOSCOPY WITH PROPOFOL N/A 07/10/2015   Procedure: COLONOSCOPY WITH PROPOFOL;  Surgeon: Lollie Sails, MD;  Location: [facility] ENDOSCOPY;  Service: Endoscopy;  Laterality: N/A;   GALLBLADDER SURGERY     HERNIA REPAIR  12/2010   LEFT HEART CATH AND CORONARY ANGIOGRAPHY N/A 02/24/2017   Procedure: Left Heart Cath and Coronary  Angiography;  Surgeon: Wellington Hampshire, MD;  Location: [place] CV LAB;  Service: Cardiovascular;  Laterality: N/A;   TONSILLECTOMY      There were no vitals filed for this visit.   Subjective Assessment - 08/01/21 0648     Subjective Patient reports ongoing left ankle, knee, hip pain. She does report her back pain has improved. She states compliance with home exercise program and reports she was able to negotiate steps at home.    Patient is accompained by: Family member    Pertinent History 60 yo Female s/p fall with subsequent closed non-displaced segmental fracture of left fibula on 05/17/21. Pt reports she sustained her initial injury when she was leaving a resturaunt. She reprots she was stepping onto an outdoor carpeted surface which had a unmarked, large indentation beneath the carpet which could not be noticed. When she stepped into this area, her ankle turned and resulted in the fracture and fall. She was casted for 6 weeks and has now been given a walking boot which she will wear for 6 weeks. MD order states, "May gradually advance weight bearing as tolerated in the boot. Once full weightbearing, may wean out of boot" She also suffered an L1 compression fracture (25% loss of height). She reports  orthopedic MD has not evaluated her ankle. She has a referral to [facility] to evaluate ankle later today. 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 As far as she is aware she has not had a clot at this point. She also has osteopenia. Pt is unable to don/doff boot independently due to difficulty bending over, husband helps her; Pt reports increased back pain with difficulty getting in/out of bed and has since been sleeping and living in a lift chair. PMH significant: Clotting disorder, HLD, hypothyroidism, Osteopenia, SVT, vitamin D deficiency    Limitations Sitting;Lifting;Standing;Walking;House hold activities    How long can you stand  comfortably? Unsure- hasn't started weight bearing in LLE at this time    How long can you walk comfortably? limited, walkes with walker at home and in clinic. Uses whelchair for longer distance ambulatory tasks.    Diagnostic tests 10/2: MRI of lumbar spine: Evolving subacute compression fracture involving the superior  endplate of L1 with mild 25% height loss and trace 2 mm bony  retropulsion. No associated stenosis.  2. Small left foraminal disc protrusion with annular fissure at  L4-5, closely approximating and potentially irritating the exiting  left L4 nerve root.  3. Mild-to-moderate facet hypertrophy at L3-4 through L5-S1. X-ray of LLE: 1.  Mildly displaced oblique extra-articular fracture of the proximal left fibular diaphysis.   2.  Nondisplaced left lateral malleolar fracture.   3.  There is no significant osteoarthrosis.    Patient Stated Goals Pt wants to be able to walk normally again without pain and with normalized gait pattern.    Currently in Pain? Yes    Pain Score 6    Patient reports 3-61/0 left ankle pain during visit today as well as some left knee pain/upper left thigh pain.   Pain Location Ankle    Pain Orientation Lower    Pain Descriptors / Indicators Aching;Throbbing    Pain Type Chronic pain    Pain Onset More than a month ago    Pain Frequency Intermittent    Multiple Pain Sites Yes    Pain Score 4    Pain Location Hip    Pain Orientation Left    Pain Descriptors / Indicators Aching    Pain Type Chronic pain    Pain Onset More than a month ago    Pain Frequency Intermittent    Pain Onset More than a month ago            INTERVENTIONS:  Therapeutic exercises:   Patient and PT reviewed previously instructed  A-Z left ankle ROM activities in long sittting. Patient able to perform today without cues or assistance.   Patient was then educated in resistive open chain ankle strengthening using NON-Latex Theraband today in long sitting:   Ankle  Dorsiflexion Ankle Plantarflexion Ankle Eversion Ankle Inversion 10-12 reps each with PT providing VC and visual demonstration for proper form. Husband was present and observed so he would be able to assist with holding band for successful transition of these new exercises into her home program. She demo excellent ability to return demonstration with only minimal cues to try to isolate the foot and avoid moving knee or knee to assist. She did report some "throbbing" in left ankle yet able to complete all exercises well today.   She did report some left knee soreness and questioned if any exercise could help. Discussed importance of VMO as well as total  quad strengthening to maximize strength of thigh and ensure patella is tracking correctly. Review some current LE exercises including quad sets that she is performing and then added seated knee ext with adduction (squeezing knees together with pillow) - hold 5 sec while extending knee- Patient able to perform 10 without increase report of pain - again with excellent return of demonstration of technique.   Next added supine Hip abduction as she reports she was not currently performing this particular exercise to help focus on more hip strengthening as pain free as possible. Patient able to perform 10+reps of supine hip abd with VC's to maintain upright foot position to correctly target lateral hip musculature.   GAIT TRAINING:   Patient demonstrated modified [place] with sit to stand transfer then using her front wheeled walker and Left CAM boot- proceeded to ambulate approx 175 feet in gym using short reciprocal steps with good heel strike and increased UE support as seen by brief pause. Patient performed well yet did endorse some increase pain but stated not greater than 6/10.   PT reviewed gait with patient and her husband and informed both that she is ambulating appropriately for her stage of rehab at weight bearing as tolerated. PT did discussed  that as he is able to tolerate more weight through Left LE that she should be able to more continuously push walker with decreased weight bearing. Patient verbalized understanding of this technique. Emphasized no push to proceed at this time and that she should build up to this as her weight bearing and pain improves.   Education provided throughout session via VC/TC and demonstration to facilitate movement at target joints and correct muscle activation for all testing and exercises performed.    Clinical Impression: Patient presents with excellent motivation and very determined to progress with her strength, weight bearing, and walking. She is making steady progress with overall gait distance and ability to add more weight through Left LE without significant increase in left ankle pain and responsive to all verbal and visual cues provided during session. She responded well to addition of resistive ankle strengthening use non-latex theraband and will benefit from review next session. Pt will continue to benefit from skilled PT intevention in order to improve her function, strength, range of motion, ambulatory capacity and her QOL.                           PT Education - 08/02/21 0647     Education Details Resistive LE strengthening technique using non-latex theraband; safety with gait sequencing - WBAT    Person(s) Educated Patient;Spouse    Methods Explanation;Demonstration;Tactile cues;Verbal cues    Comprehension Verbalized understanding;Returned demonstration;Verbal cues required;Need further instruction;Tactile cues required              PT Short Term Goals - 07/11/21 1443       PT SHORT TERM GOAL #1   Title Patient will be independent in home exercise program to improve strength/mobility for better functional independence with ADLs.    Baseline 10/26: HEP given    Time 4    Period Weeks    Status New    Target Date 08/08/21      PT SHORT TERM GOAL #2    Title Patient will report a worse VAS of 4/10 in LLE for improved tolerance of mobility and quality of life.    Time 4    Period Weeks    Status New  Target Date 08/08/21               PT Long Term Goals - 07/11/21 1444       PT LONG TERM GOAL #1   Title Patient will increase FOTO score to equal to or greater than 47%    to demonstrate statistically significant improvement in mobility and quality of life.    Baseline 10/26: 5%    Time 8    Period Weeks    Status New    Target Date 09/05/21      PT LONG TERM GOAL #2   Title Patient will increase 10 meter walk test to >1.56m/s as to improve gait speed for better community ambulation and to reduce fall risk.    Baseline 10/26: unable to ambulate at this time    Time 8    Period Weeks    Status New    Target Date 09/05/21      PT LONG TERM GOAL #3   Title Patient will increase BLE gross strength to 4+/5 as to improve functional strength for independent gait, increased standing tolerance and increased ADL ability.    Baseline 10/26: see note    Time 8    Period Weeks    Status New    Target Date 09/05/21      PT LONG TERM GOAL #4   Title Patient will tolerate weightbearing for >30 minutes for increased mobility and ambulation to increase indepenence with ADLs and iADLs    Baseline 10/26; able to tolerate 5 seconds    Time 8    Period Weeks    Status New    Target Date 09/05/21      PT LONG TERM GOAL #5   Title Patient will increase ankle ROM to 10 degrees within normal range for improved gait mechanics and reduced pain.    Baseline 10/26: see note    Time 8    Period Weeks    Status New    Target Date 09/05/21                   Plan - 08/01/21 0656     Clinical Impression Statement Patient presents with excellent motivation and very determined to progress with her strength, weight bearing, and walking. She is making steady progress with overall gait distance and ability to add more weight through Left  LE without significant increase in left ankle pain and responsive to all verbal and visual cues provided during session. She responded well to addition of resistive ankle strengthening use non-latex theraband and will benefit from review next session. Pt will continue to benefit from skilled PT intevention in order to improve her function, strength, range of motion, ambulatory capacity and her QOL.    Personal Factors and Comorbidities Age;Comorbidity 3+;Past/Current Experience;Sex;Time since onset of injury/illness/exacerbation;Transportation    Comorbidities HLD, hypothyroidism, ostepenia, osteoporis, SVT, clotting disorder    Examination-Activity Limitations Bathing;Bed Mobility;Bend;Caring for [employer];Locomotion Level;Lift;Hygiene/Grooming;Squat;Stairs;Stand;Toileting;Transfers    Examination-Participation Restrictions Church;Cleaning;Community Activity;Driving;Interpersonal Relationship;Personal Finances;Meal Prep;Laundry;Shop;Volunteer;Yard Work;Occupation    Stability/Clinical Decision Making Evolving/Moderate complexity    Rehab Potential Fair    PT Frequency 2x / week    PT Duration 8 weeks    PT Treatment/Interventions ADLs/Self Care Home Management;Aquatic Therapy;Biofeedback;Cryotherapy;Electrical Stimulation;Iontophoresis 4mg /ml Dexamethasone;Moist Heat;Ultrasound;DME Instruction;Gait training;Stair training;Functional mobility training;Neuromuscular re-education;Cognitive remediation;Balance training;Therapeutic exercise;Therapeutic activities;Patient/family education;Orthotic Fit/Training;Manual techniques;Compression bandaging;Manual lymph drainage;Scar mobilization;Passive range of motion;Energy conservation;Splinting;Taping;Vasopneumatic Device;Visual/perceptual remediation/compensation    PT Next Visit Plan transfer training with walker,  WB activities, Gait and  balance training as appropriate; Progressive LE strength and ROM activities. Continue  POC as previously indicated    PT Home Exercise Plan Added resistive Theraband for ankle strengthening and Reviewed more continuous gait sequencing    Consulted and Agree with Plan of Care Patient             Patient will benefit from skilled therapeutic intervention in order to improve the following deficits and impairments:  Abnormal gait, Cardiopulmonary status limiting activity, Decreased activity tolerance, Decreased balance, Decreased knowledge of precautions, Decreased endurance, Decreased knowledge of use of DME, Decreased range of motion, Decreased mobility, Difficulty walking, Decreased strength, Hypomobility, Increased edema, Impaired flexibility, Impaired perceived functional ability, Increased muscle spasms, Improper body mechanics, Pain  Visit Diagnosis: Abnormality of gait and mobility  Difficulty in walking, not elsewhere classified  Muscle weakness (generalized)  Unsteadiness on feet     Problem List Patient Active Problem List   Diagnosis Date Noted   NSTEMI (non-ST elevated myocardial infarction) ([place]) 07/14/2021   Pulmonary nodule less than 6 mm determined by computed tomography of lung 07/14/2021   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 tract infection 04/19/2021   Hepatic hemangioma 06/02/2020   Constipation 06/02/2020   Left ankle sprain 03/25/2018   Foot injury, left, initial encounter 03/25/2018   Osteoporosis of lumbar spine 02/19/2018   Subcutaneous nodule 09/16/2017   Pain and swelling of lower leg 09/04/2017   Nevus 09/04/2017   Pain of both hip joints 06/03/2017   Need for hepatitis C screening test 05/22/2017   SVT s/p ablation 01/09/2010    Lewis Moccasin, PT 08/02/2021, 8:01 AM  [facility] [address] [place], [place], [address] Phone: [phone]   Fax:  [phone]  Name: Michelle Armstrong MRN: [id number] Date of Birth: 1961/06/29

## 2021-08-02 NOTE — Telephone Encounter (Signed)
FYI called patient & since has been wall over one week since patient finished prednisone pack that should okay to have flu shot. Typically this is okay, but patient did have recent heart attack due to prednisone. Okay for her to get flu shot? She has not schedule due to not knowing husbands schedule.

## 2021-08-02 NOTE — Telephone Encounter (Signed)
She can proceed with the flu vaccine.

## 2021-08-02 NOTE — Telephone Encounter (Signed)
Noted. Patient is to call back to schedule.

## 2021-08-02 NOTE — Telephone Encounter (Signed)
Patient called about getting flu shot, still waiting for office to respond. She would like to make a flu shot appt.

## 2021-08-06 ENCOUNTER — Ambulatory Visit: Payer: Managed Care, Other (non HMO)

## 2021-08-07 ENCOUNTER — Other Ambulatory Visit: Payer: Managed Care, Other (non HMO)

## 2021-08-08 ENCOUNTER — Other Ambulatory Visit: Payer: Self-pay

## 2021-08-08 ENCOUNTER — Ambulatory Visit: Payer: Managed Care, Other (non HMO)

## 2021-08-08 DIAGNOSIS — R262 Difficulty in walking, not elsewhere classified: Secondary | ICD-10-CM

## 2021-08-08 DIAGNOSIS — R269 Unspecified abnormalities of gait and mobility: Secondary | ICD-10-CM

## 2021-08-08 DIAGNOSIS — R2681 Unsteadiness on feet: Secondary | ICD-10-CM

## 2021-08-08 DIAGNOSIS — M6281 Muscle weakness (generalized): Secondary | ICD-10-CM

## 2021-08-08 DIAGNOSIS — M545 Low back pain, unspecified: Secondary | ICD-10-CM

## 2021-08-08 NOTE — Therapy (Signed)
[place] MAIN [facility] SERVICES [address] [place], [place], [address] Phone: [phone]   Fax:  [phone]  Physical Therapy Treatment/Re-eval (new order for LBP)  Patient Details  Name: Michelle Armstrong MRN: [id number] Date of Birth: 1960-11-05 Referring Provider (PT): Hildred Alamin PA   Encounter Date: 08/08/2021   PT End of Session - 08/08/21 1139     Visit Number 8    Number of Visits 16    Date for PT Re-Evaluation 09/05/21    Authorization Type 4/10 eval 07/11/21    PT Start Time 0800    PT Stop Time 0846    PT Time Calculation (min) 46 min    Equipment Utilized During Treatment Other (comment)    Activity Tolerance Patient limited by pain;Patient tolerated treatment well    Behavior During Therapy [facility] for tasks assessed/performed             Past Medical History:  Diagnosis Date   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 02/2010 Echo: EF 55-60%, no rwma.   Vitamin D deficiency    low    Past Surgical History:  Procedure Laterality Date   ABDOMINAL HYSTERECTOMY  1999   Total lap hyst BSO due to endometriosis   CARDIAC ELECTROPHYSIOLOGY MAPPING AND ABLATION  2011   COLONOSCOPY WITH PROPOFOL N/A 07/10/2015   Procedure: COLONOSCOPY WITH PROPOFOL;  Surgeon: Lollie Sails, MD;  Location: [facility] ENDOSCOPY;  Service: Endoscopy;  Laterality: N/A;   GALLBLADDER SURGERY     HERNIA REPAIR  12/2010   LEFT HEART CATH AND CORONARY ANGIOGRAPHY N/A  02/24/2017   Procedure: Left Heart Cath and Coronary Angiography;  Surgeon: Wellington Hampshire, MD;  Location: [facility] CV LAB;  Service: Cardiovascular;  Laterality: N/A;   TONSILLECTOMY      There were no vitals filed for this visit.   Subjective Assessment - 08/08/21 1130     Subjective Patient reports ongoing upper, mid, and low back pain with tingling into Left elbow/wrist/hand, Pain radiating from left upper back, Bilateral Mid back and bilateral low back into lateral and anterior hips. She reports increased left knee stiffness/pain in morning that does improve. She states she is walking more at home using the boot. She reports having some difficulty with performing prescribed resistive ankle strengthening with yellow theraband.    Patient is accompained by: Family member    Pertinent History 61 yo Female s/p fall with subsequent closed non-displaced segmental fracture of left fibula on 05/17/21. Pt reports she sustained her initial injury when she was leaving a resturaunt. She reprots she was stepping onto an outdoor carpeted surface which had a unmarked, large indentation beneath the carpet which could not be noticed. When she stepped into this area, her ankle turned and resulted in the fracture and fall. She was casted for 6 weeks and has now been given a walking  boot which she will wear for 6 weeks. MD order states, "May gradually advance weight bearing as tolerated in the boot. Once full weightbearing, may wean out of boot" She also suffered an L1 compression fracture (25% loss of height). She reports orthopedic MD has not evaluated her ankle. She has a referral to [facility] to evaluate ankle later today. 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 As far as she is aware she has not had a clot at this point. She also has osteopenia. Pt is unable to don/doff boot independently due to difficulty bending over, husband helps her; Pt reports  increased back pain with difficulty getting in/out of bed and has since been sleeping and living in a lift chair. PMH significant: Clotting disorder, HLD, hypothyroidism, Osteopenia, SVT, vitamin D deficiency    Limitations Sitting;Lifting;Standing;Walking;House hold activities    How long can you stand comfortably? very limited secondary to pain    How long can you walk comfortably? Very limited  but imroving with walking at home using a front wheeled walker and walking boot on left side    Diagnostic tests 10/2: MRI of lumbar spine: Evolving subacute compression fracture involving the superior  endplate of L1 with mild 25% height loss and trace 2 mm bony  retropulsion. No associated stenosis.  2. Small left foraminal disc protrusion with annular fissure at  L4-5, closely approximating and potentially irritating the exiting  left L4 nerve root.  3. Mild-to-moderate facet hypertrophy at L3-4 through L5-S1. X-ray of LLE: 1.  Mildly displaced oblique extra-articular fracture of the proximal left fibular diaphysis.   2.  Nondisplaced left lateral malleolar fracture.   3.  There is no significant osteoarthrosis.    Patient Stated Goals Pt wants to be able to walk normally again without pain and with normalized gait pattern and return to previous level of function.    Currently in Pain? Yes   Low back, Upper left side, B Mid back, left knee and left ankle   Pain Score 4     Pain Location Back    Pain Orientation Right;Left;Posterior;Upper;Mid;Lower    Pain Descriptors / Indicators Aching;Crying;Discomfort;Grimacing;Guarding;Headache;Pins and needles;Pressure;Radiating;Sharp;Sore;Stabbing;Spasm;Tender;Throbbing;Tightness;Tingling    Pain Type Chronic pain    Pain Radiating Towards Paint radiated from upper back down left arm into elbow/wrist/hand    Pain Onset More than a month ago    Pain Frequency Constant    Aggravating Factors  Moving around    Pain Relieving Factors Rest, Meds- Tylenol    Effect of  Pain on Daily Activities Difficulty perofrming ADLs, bending over, walking    Multiple Pain Sites Yes   See above for multiple pain locations            INTERVENTIONS:   SUBJECTIVE Chief complaint:  See subjective history  Red flags: none LBP <20 y/o) Negative Referring Dx: Low back pain Referring Provider: Dr. Kayleen Memos Pain location: See pain section Pain: Present 4/10, Best 3-4/10, Worst 6-7 /10 Follow-up appointment with MD: Yes Falls in the last 6 months: Yes    OBJECTIVE  Mental Status Patient is oriented to person, place and time.  Recent memory is intact.  Remote memory is intact.  Attention span and concentration are intact.  Expressive speech is intact.  Patient's fund of knowledge is within normal limits for educational level.      Posture Lumbar lordosis: WNL Iliac crest height: equal bilaterally   AROM (degrees) Lumbar forward flexion:  Very limited secondary to increased low back pain  Lumbar extension: Neutral-  Lumbar lateral flexion: limited with testing due to increased pain with all mobility Thoracic and Lumbar rotation Hip IR (0-45): R: L:     Repeated Movements No centralization or peripheralization of symptoms with repeated lumbar extension or flexion - however patient did report increased low back pain while flexing forward and extending back to neutral.    Sensation Grossly intact to light touch bilateral LEs as determined by testing dermatomes L2-S2. Proprioception and hot/cold testing deferred on this date. Patient did report slight decreased sensation with light touch with lateral hip/thigh region     Palpation Graded on 0-4 scale (0 = no pain, 1 = pain, 2 = pain with wincing/grimacing/flinching, 3 = pain with withdrawal, 4 = unwilling to allow palpation) Location LEFT  RIGHT           Thoracic and Lumbar paraspinals 2 2  Quadratus Lumborum 2 2  PSIS 2 2   Muscle Length Hamstrings: + for pain with SLR testing Thomas:  Unable to test due to increased low back/hip/knee pain FABER: unable to test due to increased low back/hip/knee pain Ober:     Passive Accessory Intervertebral Motion (PAIVM)- did not test as paitent was in a lot of pain and did not want to make any worse after all above testing so did not ask to position into prone today.     Other interventions:   Reviewed how to position yellow non-latex theraband for ankle resistive strengthening exercises today and patient and her husband verbalized good understanding of correct technique.    ASSESSMENT Clinical Impression: Pt presents today with excellent motivation and new referral with diagnosis of low back pain (with recent imaging revealing L1 compression fracture) . While patient does present with low back pain she also presents with upper trap/back/ mid back/B hips/thigh/left knee and continued ankle pain. Patient was very tight throughout her thoracic and lumbar paraspinals and testing was difficult due to increased pain with all mobility. Patient was willing to try all test but some portions of testing were deferred to not increase her pain and she will benefit from further testing as appropriate. She does present with pain limited lumbar ROM and deficits with decreased LE strength and mobility. Her back pain is limiting her ability to progress with ongoing therapy with her recently fractured ankle.  Pt will benefit from skilled PT services to address deficits and return to pain-free function at home and work.                  PT Education - 08/08/21 1139     Education Details PT plan of care; Pain relieving methods including position changes, gentle stretching, use of heat/ice as appropriate.    Person(s) Educated Patient;Spouse    Methods Explanation;Demonstration;Tactile cues;Verbal cues    Comprehension Verbalized understanding;Returned demonstration;Verbal cues required;Need further instruction;Tactile cues required               PT Short Term Goals - 07/11/21 1443       PT SHORT TERM GOAL #1   Title Patient will be independent in home exercise program to improve strength/mobility for better functional independence with ADLs.    Baseline 10/26: HEP given    Time 4    Period Weeks    Status New    Target Date 08/08/21      PT SHORT TERM GOAL #2   Title Patient will report a worse VAS of 4/10  in LLE for improved tolerance of mobility and quality of life.    Time 4    Period Weeks    Status New    Target Date 08/08/21               PT Long Term Goals - 08/08/21 1547       PT LONG TERM GOAL #1   Title Patient will increase FOTO score to equal to or greater than 47%    to demonstrate statistically significant improvement in mobility and quality of life.    Baseline 10/26: 5%    Time 8    Period Weeks    Status New    Target Date 09/05/21      PT LONG TERM GOAL #2   Title Patient will increase 10 meter walk test to >1.25m/s as to improve gait speed for better community ambulation and to reduce fall risk.    Baseline 10/26: unable to ambulate at this time    Time 8    Period Weeks    Status New    Target Date 09/05/21      PT LONG TERM GOAL #3   Title Patient will increase BLE gross strength to 4+/5 as to improve functional strength for independent gait, increased standing tolerance and increased ADL ability.    Baseline 10/26: see note    Time 8    Period Weeks    Status New    Target Date 09/05/21      PT LONG TERM GOAL #4   Title Patient will tolerate weightbearing for >30 minutes for increased mobility and ambulation to increase indepenence with ADLs and iADLs    Baseline 10/26; able to tolerate 5 seconds    Time 8    Period Weeks    Status New    Target Date 09/05/21      PT LONG TERM GOAL #5   Title Patient will increase ankle ROM to 10 degrees within normal range for improved gait mechanics and reduced pain.    Baseline 10/26: see note    Time 8    Period Weeks    Status  New    Target Date 09/05/21      Additional Long Term Goals   Additional Long Term Goals Yes      PT LONG TERM GOAL #6   Title Pt will decrease worst low back pain as reported on NPRS by at least 2 points in order to demonstrate clinically significant reduction in back pain.    Baseline 08/08/2021= LBP at worst = 7/10    Time 4    Period Weeks    Status New    Target Date 09/05/21                   Plan - 08/08/21 1140     Clinical Impression Statement Pt presents today with excellent motivation and new referral with diagnosis of low back pain (with recent imaging revealing L1 compression fracture) . While patient does present with low back pain she also presents with upper trap/back/ mid back/B hips/thigh/left knee and continued ankle pain. Patient was very tight throughout her thoracic and lumbar paraspinals and testing was difficult due to increased pain with all mobility. Patient was willing to try all test but some portions of testing were deferred to not increase her pain and she will benefit from further testing as appropriate. She does present with pain limited lumbar ROM and deficits with decreased LE strength and mobility.  Her back pain is limiting her ability to progress with ongoing therapy with her recently fractured ankle.  Pt will benefit from skilled PT services to address deficits and return to pain-free function at home and work.    Personal Factors and Comorbidities Age;Comorbidity 3+;Past/Current Experience;Sex;Time since onset of injury/illness/exacerbation;Transportation    Comorbidities HLD, hypothyroidism, ostepenia, osteoporis, SVT, clotting disorder    Examination-Activity Limitations Bathing;Bed Mobility;Bend;Caring for [employer];Locomotion Level;Lift;Hygiene/Grooming;Squat;Stairs;Stand;Toileting;Transfers    Examination-Participation Restrictions Church;Cleaning;Community Activity;Driving;Interpersonal  Relationship;Personal Finances;Meal Prep;Laundry;Shop;Volunteer;Yard Work;Occupation    Stability/Clinical Decision Making Evolving/Moderate complexity    Rehab Potential Fair    PT Frequency 2x / week    PT Duration 8 weeks    PT Treatment/Interventions ADLs/Self Care Home Management;Aquatic Therapy;Biofeedback;Cryotherapy;Electrical Stimulation;Iontophoresis 4mg /ml Dexamethasone;Moist Heat;Ultrasound;DME Instruction;Gait training;Stair training;Functional mobility training;Neuromuscular re-education;Cognitive remediation;Balance training;Therapeutic exercise;Therapeutic activities;Patient/family education;Orthotic Fit/Training;Manual techniques;Compression bandaging;Manual lymph drainage;Scar mobilization;Passive range of motion;Energy conservation;Splinting;Taping;Vasopneumatic Device;Visual/perceptual remediation/compensation    PT Next Visit Plan transfer training with walker,  WB activities, Gait and balance training as appropriate; Progressive LE strength and ROM activities. Review and progress Low back/knee gentle stretching    PT Home Exercise Plan Discussed adding knee flex stretch in chair; hamstring stretch in chair as tolerated; Lower trunk rotation, seated mid back rotation    Consulted and Agree with Plan of Care Patient             Patient will benefit from skilled therapeutic intervention in order to improve the following deficits and impairments:  Abnormal gait, Cardiopulmonary status limiting activity, Decreased activity tolerance, Decreased balance, Decreased knowledge of precautions, Decreased endurance, Decreased knowledge of use of DME, Decreased range of motion, Decreased mobility, Difficulty walking, Decreased strength, Hypomobility, Increased edema, Impaired flexibility, Impaired perceived functional ability, Increased muscle spasms, Improper body mechanics, Pain  Visit Diagnosis: Abnormality of gait and mobility  Difficulty in walking, not elsewhere  classified  Muscle weakness (generalized)  Unsteadiness on feet  Chronic bilateral low back pain without sciatica     Problem List Patient Active Problem List   Diagnosis Date Noted   NSTEMI (non-ST elevated myocardial infarction) ([place]) 07/14/2021   Pulmonary nodule less than 6 mm determined by computed tomography of lung 07/14/2021   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 tract infection 04/19/2021   Hepatic hemangioma 06/02/2020   Constipation 06/02/2020   Left ankle sprain 03/25/2018   Foot injury, left, initial encounter 03/25/2018   Osteoporosis of lumbar spine 02/19/2018   Subcutaneous nodule 09/16/2017   Pain and swelling of lower leg 09/04/2017   Nevus 09/04/2017   Pain of both hip joints 06/03/2017   Need for hepatitis C screening test 05/22/2017   SVT s/p ablation 01/09/2010    Lewis Moccasin, PT 08/09/2021, 3:52 PM  [place] [address] [place], [place], [address] Phone: [phone]   Fax:  [phone]  Name: Michelle Armstrong MRN: [id number] Date of Birth: 1961/01/03

## 2021-08-13 ENCOUNTER — Telehealth: Payer: Self-pay | Admitting: Obstetrics and Gynecology

## 2021-08-13 ENCOUNTER — Telehealth: Payer: Self-pay | Admitting: Family Medicine

## 2021-08-13 ENCOUNTER — Other Ambulatory Visit: Payer: Self-pay

## 2021-08-13 ENCOUNTER — Ambulatory Visit: Payer: Managed Care, Other (non HMO) | Admitting: Physical Therapy

## 2021-08-13 ENCOUNTER — Ambulatory Visit
Admission: RE | Admit: 2021-08-13 | Discharge: 2021-08-13 | Disposition: A | Discharge status: Home / Self Care | Payer: Managed Care, Other (non HMO) | Source: Ambulatory Visit | Attending: Obstetrics and Gynecology | Admitting: Obstetrics and Gynecology

## 2021-08-13 DIAGNOSIS — M6281 Muscle weakness (generalized): Secondary | ICD-10-CM

## 2021-08-13 DIAGNOSIS — R269 Unspecified abnormalities of gait and mobility: Secondary | ICD-10-CM

## 2021-08-13 DIAGNOSIS — M810 Age-related osteoporosis without current pathological fracture: Secondary | ICD-10-CM | POA: Insufficient documentation

## 2021-08-13 DIAGNOSIS — R2681 Unsteadiness on feet: Secondary | ICD-10-CM

## 2021-08-13 DIAGNOSIS — R262 Difficulty in walking, not elsewhere classified: Secondary | ICD-10-CM

## 2021-08-13 NOTE — Telephone Encounter (Signed)
Pt called in and would like for you to give her a call to discuss her bone density results.

## 2021-08-13 NOTE — Therapy (Signed)
[place] MAIN [facility] SERVICES [address] [place], [place], [address] Phone: [phone]   Fax:  [phone]  Physical Therapy Treatment  Patient Details  Name: Michelle Armstrong MRN: [id number] Date of Birth: [birth year] Referring Provider (PT): Hildred Alamin PA   Encounter Date: 08/13/2021   PT End of Session - 08/13/21 0833     Visit Number 9    Number of Visits 16    Date for PT Re-Evaluation 09/05/21    Authorization Type 4/10 eval 07/11/21    PT Start Time 0800    PT Stop Time 0855    PT Time Calculation (min) 55 min    Equipment Utilized During Treatment Other (comment)   SBQC, SPC   Activity Tolerance Patient limited by pain;Patient tolerated treatment well    Behavior During Therapy [facility] for tasks assessed/performed             Past Medical History:  Diagnosis Date   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 02/2010 Echo: EF 55-60%, no rwma.   Vitamin D deficiency    low    Past Surgical History:  Procedure Laterality Date   ABDOMINAL HYSTERECTOMY  1999   Total lap hyst BSO due to endometriosis   CARDIAC ELECTROPHYSIOLOGY MAPPING AND ABLATION  2011   COLONOSCOPY WITH PROPOFOL N/A 07/10/2015   Procedure: COLONOSCOPY WITH PROPOFOL;  Surgeon: Lollie Sails, MD;  Location: [facility] ENDOSCOPY;  Service: Endoscopy;  Laterality: N/A;   GALLBLADDER SURGERY     HERNIA REPAIR  12/2010   LEFT HEART CATH AND CORONARY ANGIOGRAPHY N/A 02/24/2017    Procedure: Left Heart Cath and Coronary Angiography;  Surgeon: Wellington Hampshire, MD;  Location: [facility] CV LAB;  Service: Cardiovascular;  Laterality: N/A;   TONSILLECTOMY      There were no vitals filed for this visit.   Subjective Assessment - 08/13/21 0801     Subjective Pt reports continued progression with her walking with the walker. Is now able to walk with improved gait pattern. Still having reports of pain in low back, posterior hips and radiating into anterior left hip. Pt also continues to report knee pain on the left side. Pt reports visit with MD next Friday for follow up. Pt reports no other significant changes since last session.    Patient is accompained by: Family member    Pertinent History 60 yo Female s/p fall with subsequent closed non-displaced segmental fracture of left fibula on 05/17/21. Pt reports she sustained her initial injury when she was leaving a resturaunt. She reprots she was stepping onto an outdoor carpeted surface which had a unmarked, large indentation beneath the carpet which could not be noticed. When she stepped into this area, her ankle turned and resulted in the fracture and fall. She was casted for 6 weeks and has now been given a walking boot which she will  wear for 6 weeks. MD order states, "May gradually advance weight bearing as tolerated in the boot. Once full weightbearing, may wean out of boot" She also suffered an L1 compression fracture (25% loss of height). She reports orthopedic MD has not evaluated her ankle. She has a referral to [facility] to evaluate ankle later today. 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 As far as she is aware she has not had a clot at this point. She also has osteopenia. Pt is unable to don/doff boot independently due to difficulty bending over, husband helps her; Pt reports increased back pain with difficulty getting in/out of bed and has since been sleeping and  living in a lift chair. PMH significant: Clotting disorder, HLD, hypothyroidism, Osteopenia, SVT, vitamin D deficiency    Limitations Sitting;Lifting;Standing;Walking;House hold activities    How long can you stand comfortably? very limited secondary to pain    How long can you walk comfortably? Very limited  but imroving with walking at home using a front wheeled walker and walking boot on left side    Diagnostic tests 10/2: MRI of lumbar spine: Evolving subacute compression fracture involving the superior  endplate of L1 with mild 25% height loss and trace 2 mm bony  retropulsion. No associated stenosis.  2. Small left foraminal disc protrusion with annular fissure at  L4-5, closely approximating and potentially irritating the exiting  left L4 nerve root.  3. Mild-to-moderate facet hypertrophy at L3-4 through L5-S1. X-ray of LLE: 1.  Mildly displaced oblique extra-articular fracture of the proximal left fibular diaphysis.   2.  Nondisplaced left lateral malleolar fracture.   3.  There is no significant osteoarthrosis.    Patient Stated Goals Pt wants to be able to walk normally again without pain and with normalized gait pattern and return to previous level of function.    Currently in Pain? Yes    Pain Location Back    Pain Orientation Right;Left;Posterior;Mid    Pain Descriptors / Indicators Aching;Sharp;Sore;Tender    Pain Type Chronic pain    Pain Onset More than a month ago    Multiple Pain Sites Yes    Pain Location Ankle    Pain Orientation Left    Pain Descriptors / Indicators Aching    Pain Type Chronic pain    Pain Onset More than a month ago    Pain Onset More than a month ago               Treatment provided this session  Therex:  Toe yoga 2 x 10 with 2 sec holds   Towel scrunches 2 x 10 with 2 sec holds  Ankle 4 -way x 20 ea (30 w/PF) using YTB (latex Free)  -utilized loop at end of thera-band in order to reduce resistance and enable pt to better tolerate exercise  with improved ROM noted with all movements.   Attempted to lie supine but pt unable to tolerate positioning due to pain, compelted following hip strengthening exercises in seated position as result  -Seated hip abduction (R) 2 x 10  -LATEX FREE RTB looped around thigh (above knee)   -with band below knee caused discomfort/pain to proximal fibula, when moved above knee pt able to tolerate better.   Following standing exercises -Seated bilateral hamstring stretch 2 x 30 seconds on each lower extremity -Seated bilateral piriformis stretch with technique of pulling lower extremity to contralateral shoulder 2 x 30  seconds on each lower extremity       There Act: The following activities were completed in parallel bars or at balance bar  -walking with bil UE with fingertip only x 2 lengths of parallel bars -Walkig with R UE (all fingertips) and L UE 2 fingertips x 2 lengths of // bars  -Walking with R UE on parallel bars (holding bar) x 2 lengths  -with each bout of walking pt gait pattern improved with practice with progression to step through gait pattern.   Seated break x 2 min and pt was educated regarding cane height and adjustments at this time.   The following activities were completed in parallel bars or at balance bar  Abulation trial and training with cane  -x 2 lengths of parallel bars with SBQC in R UE -X2 lengths of parallel bars with single-point cane.  Patient had good stability with this exercise but did report increased pain in left lower extremity with single-point cane compared with small-base quad cane   Patient independently transferred back to mat table         Pt educated throughout session about proper technique with exercises. Improved exercise technique, movement at target joints, use of target muscles after min to mod verbal, visual, tactile cues.                          PT Education - 08/13/21 1149     Education Details Educated  regarding use of cane in R UE and improtance of practice in small quantities to improve tolerance for activity. Also educated how to adjust cane to appropriate height (level of wrist) and in sequencing of cane for proper of loading of incolved LE.    Person(s) Educated Patient;Spouse    Methods Explanation;Demonstration    Comprehension Verbalized understanding;Returned demonstration              PT Short Term Goals - 07/11/21 1443       PT SHORT TERM GOAL #1   Title Patient will be independent in home exercise program to improve strength/mobility for better functional independence with ADLs.    Baseline 10/26: HEP given    Time 4    Period Weeks    Status New    Target Date 08/08/21      PT SHORT TERM GOAL #2   Title Patient will report a worse VAS of 4/10 in LLE for improved tolerance of mobility and quality of life.    Time 4    Period Weeks    Status New    Target Date 08/08/21               PT Long Term Goals - 08/08/21 1547       PT LONG TERM GOAL #1   Title Patient will increase FOTO score to equal to or greater than 47%    to demonstrate statistically significant improvement in mobility and quality of life.    Baseline 10/26: 5%    Time 8    Period Weeks    Status New    Target Date 09/05/21      PT LONG TERM GOAL #2   Title Patient will increase 10 meter walk test to >1.69m/s as to improve gait speed for better community ambulation and to reduce fall risk.    Baseline 10/26: unable to ambulate at this time    Time 8    Period Weeks    Status New  Target Date 09/05/21      PT LONG TERM GOAL #3   Title Patient will increase BLE gross strength to 4+/5 as to improve functional strength for independent gait, increased standing tolerance and increased ADL ability.    Baseline 10/26: see note    Time 8    Period Weeks    Status New    Target Date 09/05/21      PT LONG TERM GOAL #4   Title Patient will tolerate weightbearing for >30 minutes for  increased mobility and ambulation to increase indepenence with ADLs and iADLs    Baseline 10/26; able to tolerate 5 seconds    Time 8    Period Weeks    Status New    Target Date 09/05/21      PT LONG TERM GOAL #5   Title Patient will increase ankle ROM to 10 degrees within normal range for improved gait mechanics and reduced pain.    Baseline 10/26: see note    Time 8    Period Weeks    Status New    Target Date 09/05/21      Additional Long Term Goals   Additional Long Term Goals Yes      PT LONG TERM GOAL #6   Title Pt will decrease worst low back pain as reported on NPRS by at least 2 points in order to demonstrate clinically significant reduction in back pain.    Baseline 08/08/2021= LBP at worst = 7/10    Time 4    Period Weeks    Status New    Target Date 09/05/21                   Plan - 08/13/21 1152     Clinical Impression Statement Patient tolerated treatment session well as we continue to progress with standing, ambulation and weightbearing exercises.  Patient was able to ambulate with single-point cane during today's session demonstrated good stability with this task and improved gait mechanics with increased practice.  Patient did however have some pain and discomfort utilizing cane versus her walker was encouraged if she tries this at home to them start with small quantities to improve her tolerance and endurance for this activity.  Altered activity of ankle four-way in order to improve efficacy and range of motion with this task.  Also added seated piriformis stretch as well as seated toe yoga and towel scrunches with hold to patient's home exercise program and handout displaying and describing exercises.  Patient will continue to benefit from skilled physical therapy intervention in order to improve her lower extremity strength, ambulatory capacity, low back pain, overall function, and general left lower extremity pain.    Personal Factors and Comorbidities  Age;Comorbidity 3+;Past/Current Experience;Sex;Time since onset of injury/illness/exacerbation;Transportation    Comorbidities HLD, hypothyroidism, ostepenia, osteoporis, SVT, clotting disorder    Examination-Activity Limitations Bathing;Bed Mobility;Bend;Caring for [employer];Locomotion Level;Lift;Hygiene/Grooming;Squat;Stairs;Stand;Toileting;Transfers    Examination-Participation Restrictions Church;Cleaning;Community Activity;Driving;Interpersonal Relationship;Personal Finances;Meal Prep;Laundry;Shop;Volunteer;Yard Work;Occupation    Stability/Clinical Decision Making Evolving/Moderate complexity    Rehab Potential Fair    PT Frequency 2x / week    PT Duration 8 weeks    PT Treatment/Interventions ADLs/Self Care Home Management;Aquatic Therapy;Biofeedback;Cryotherapy;Electrical Stimulation;Iontophoresis 4mg /ml Dexamethasone;Moist Heat;Ultrasound;DME Instruction;Gait training;Stair training;Functional mobility training;Neuromuscular re-education;Cognitive remediation;Balance training;Therapeutic exercise;Therapeutic activities;Patient/family education;Orthotic Fit/Training;Manual techniques;Compression bandaging;Manual lymph drainage;Scar mobilization;Passive range of motion;Energy conservation;Splinting;Taping;Vasopneumatic Device;Visual/perceptual remediation/compensation    PT Next Visit Plan WB activities, Gait and balance training as appropriate; Progressive LE strength and ROM activities. Review and progress Low  back/knee gentle stretching    PT Home Exercise Plan added piriformis stretch seated, hamstring stretch seated, toe yoga and towel scrunches with hold. handout was provided including ankle 4-way exercises.    Consulted and Agree with Plan of Care Patient             Patient will benefit from skilled therapeutic intervention in order to improve the following deficits and impairments:  Abnormal gait, Cardiopulmonary status limiting activity,  Decreased activity tolerance, Decreased balance, Decreased knowledge of precautions, Decreased endurance, Decreased knowledge of use of DME, Decreased range of motion, Decreased mobility, Difficulty walking, Decreased strength, Hypomobility, Increased edema, Impaired flexibility, Impaired perceived functional ability, Increased muscle spasms, Improper body mechanics, Pain  Visit Diagnosis: Abnormality of gait and mobility  Difficulty in walking, not elsewhere classified  Muscle weakness (generalized)  Unsteadiness on feet     Problem List Patient Active Problem List   Diagnosis Date Noted   NSTEMI (non-ST elevated myocardial infarction) ([place]) 07/14/2021   Pulmonary nodule less than 6 mm determined by computed tomography of lung 07/14/2021   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 tract infection 04/19/2021   Hepatic hemangioma 06/02/2020   Constipation 06/02/2020   Left ankle sprain 03/25/2018   Foot injury, left, initial encounter 03/25/2018   Osteoporosis of lumbar spine 02/19/2018   Subcutaneous nodule 09/16/2017   Pain and swelling of lower leg 09/04/2017   [place] 09/04/2017   Pain of both hip joints 06/03/2017   Need for hepatitis C screening test 05/22/2017   SVT s/p ablation 01/09/2010    Particia Lather, PT 08/13/2021, 11:59 AM  [place] [address] [place], [place], [address] Phone: [phone]   Fax:  [phone]  Name: Michelle Armstrong MRN: [id number] Date of Birth: 1960/10/16

## 2021-08-13 NOTE — Telephone Encounter (Signed)
Patient called in wanting to know about a diagnosis of Hyperthyroidism in chart advised patient that PCP had resolved this diagnosis.  Patient then wanted to know if Hyperlipidemia could be removed advised due the HX of elevated LDL this was a correct DX for elevated LDL of 121.

## 2021-08-14 ENCOUNTER — Encounter: Payer: Self-pay | Admitting: Obstetrics and Gynecology

## 2021-08-15 ENCOUNTER — Ambulatory Visit: Payer: Managed Care, Other (non HMO)

## 2021-08-15 NOTE — Telephone Encounter (Signed)
Spoke with pt. Is s/p hip injury/bursitis, with prednisone use that resulted in MI. Has been out of work for 3 months, going back next wk. Discussed osteoporosis of radius on DEXA but no indications to treat. Pt also doesn't want meds due to med hx. Will cont to follow DEXA and will f/u if any mgmt changes develop.

## 2021-08-15 NOTE — Telephone Encounter (Signed)
See phone note spoke with patient.

## 2021-08-15 NOTE — Telephone Encounter (Signed)
Done

## 2021-08-16 ENCOUNTER — Ambulatory Visit: Payer: Managed Care, Other (non HMO) | Attending: Physician Assistant | Admitting: Physical Therapy

## 2021-08-16 ENCOUNTER — Encounter: Payer: Self-pay | Admitting: Physical Therapy

## 2021-08-16 ENCOUNTER — Telehealth: Payer: Self-pay | Admitting: Physician Assistant

## 2021-08-16 ENCOUNTER — Telehealth (INDEPENDENT_AMBULATORY_CARE_PROVIDER_SITE_OTHER): Payer: Managed Care, Other (non HMO) | Admitting: Physician Assistant

## 2021-08-16 ENCOUNTER — Other Ambulatory Visit: Payer: Self-pay

## 2021-08-16 ENCOUNTER — Encounter: Payer: Self-pay | Admitting: Physician Assistant

## 2021-08-16 VITALS — BP 97/68 | HR 80 | Ht 67.0 in | Wt 165.0 lb

## 2021-08-16 DIAGNOSIS — R278 Other lack of coordination: Secondary | ICD-10-CM | POA: Insufficient documentation

## 2021-08-16 DIAGNOSIS — I471 Supraventricular tachycardia: Secondary | ICD-10-CM | POA: Diagnosis not present

## 2021-08-16 DIAGNOSIS — M545 Low back pain, unspecified: Secondary | ICD-10-CM

## 2021-08-16 DIAGNOSIS — M79662 Pain in left lower leg: Secondary | ICD-10-CM | POA: Diagnosis present

## 2021-08-16 DIAGNOSIS — R778 Other specified abnormalities of plasma proteins: Secondary | ICD-10-CM | POA: Diagnosis not present

## 2021-08-16 DIAGNOSIS — M79605 Pain in left leg: Secondary | ICD-10-CM | POA: Insufficient documentation

## 2021-08-16 DIAGNOSIS — R262 Difficulty in walking, not elsewhere classified: Secondary | ICD-10-CM | POA: Diagnosis present

## 2021-08-16 DIAGNOSIS — M6281 Muscle weakness (generalized): Secondary | ICD-10-CM | POA: Diagnosis present

## 2021-08-16 DIAGNOSIS — Z87898 Personal history of other specified conditions: Secondary | ICD-10-CM

## 2021-08-16 DIAGNOSIS — G8929 Other chronic pain: Secondary | ICD-10-CM | POA: Diagnosis present

## 2021-08-16 DIAGNOSIS — R2681 Unsteadiness on feet: Secondary | ICD-10-CM | POA: Diagnosis present

## 2021-08-16 DIAGNOSIS — R269 Unspecified abnormalities of gait and mobility: Secondary | ICD-10-CM | POA: Diagnosis not present

## 2021-08-16 DIAGNOSIS — R2689 Other abnormalities of gait and mobility: Secondary | ICD-10-CM | POA: Diagnosis present

## 2021-08-16 NOTE — Therapy (Signed)
[place] MAIN [facility] SERVICES [address] [place], [place], [address] Phone: [phone]   Fax:  [phone]  Physical Therapy Treatment  Patient Details  Name: Michelle Armstrong MRN: [id number] Date of Birth: Jul 19, 1961 Referring Provider (PT): Hildred Alamin PA   Encounter Date: 08/16/2021   PT End of Session - 08/16/21 0951     Visit Number 10    Number of Visits 16    Date for PT Re-Evaluation 09/05/21    Authorization Type 4/10 eval 07/11/21    Progress Note Due on Visit 20    PT Start Time 0800    PT Stop Time 0844    PT Time Calculation (min) 44 min    Equipment Utilized During Treatment Other (comment)   SBQC, SPC   Activity Tolerance Patient limited by pain;Patient tolerated treatment well    Behavior During Therapy [facility] for tasks assessed/performed             Past Medical History:  Diagnosis Date   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 02/2010 Echo: EF 55-60%, no rwma.   Vitamin D deficiency    low    Past Surgical History:  Procedure Laterality Date   ABDOMINAL HYSTERECTOMY  1999   Total lap hyst BSO due to endometriosis   CARDIAC ELECTROPHYSIOLOGY MAPPING AND ABLATION  2011   COLONOSCOPY WITH PROPOFOL N/A 07/10/2015   Procedure: COLONOSCOPY WITH PROPOFOL;  Surgeon: Lollie Sails, MD;  Location: [facility] ENDOSCOPY;  Service: Endoscopy;  Laterality: N/A;   GALLBLADDER SURGERY     HERNIA REPAIR  12/2010   LEFT HEART CATH AND CORONARY  ANGIOGRAPHY N/A 02/24/2017   Procedure: Left Heart Cath and Coronary Angiography;  Surgeon: Wellington Hampshire, MD;  Location: [place] CV LAB;  Service: Cardiovascular;  Laterality: N/A;   TONSILLECTOMY      There were no vitals filed for this visit.   Subjective Assessment - 08/16/21 0923     Subjective Pt reports she was able to purchase "hurrycane" per advice from PT last session and in phone call between sessions. Pt reports she attempted some full weightbearing at home but had significant pain and discomfort when attempting to take steps. Pt encouraged to attempt weight shifting prior to taking steps to progress wightbearign status. Pt continues to report significant discomfort in her low back and radiating into her anterior hips. Pt happy with progress thus far and was encouraged to continue to work hard to make further gains.    Patient is accompained by: Family member    Pertinent History 60 yo Female s/p fall with subsequent closed non-displaced segmental fracture of left fibula on 05/17/21. Pt reports she sustained her initial injury when she was leaving a resturaunt. She reprots she was stepping onto an outdoor carpeted surface which had a unmarked, large indentation beneath the carpet which could not be noticed. When  she stepped into this area, her ankle turned and resulted in the fracture and fall. She was casted for 6 weeks and has now Armstrong given a walking boot which she will wear for 6 weeks. MD order states, "May gradually advance weight bearing as tolerated in the boot. Once full weightbearing, may wean out of boot" She also suffered an L1 compression fracture (25% loss of height). She reports orthopedic MD has not evaluated her ankle. She has a referral to [facility] to evaluate ankle later today. 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 As far as she is aware she has not had a clot at this point. She also has osteopenia. Pt is  unable to don/doff boot independently due to difficulty bending over, husband helps her; Pt reports increased back pain with difficulty getting in/out of bed and has since Armstrong sleeping and living in a lift chair. PMH significant: Clotting disorder, HLD, hypothyroidism, Osteopenia, SVT, vitamin D deficiency    Limitations Sitting;Lifting;Standing;Walking;House hold activities    How long can you stand comfortably? very limited secondary to pain    How long can you walk comfortably? Very limited  but imroving with walking at home using a front wheeled walker and walking boot on left side    Diagnostic tests 10/2: MRI of lumbar spine: Evolving subacute compression fracture involving the superior  endplate of L1 with mild 25% height loss and trace 2 mm bony  retropulsion. No associated stenosis.  2. Small left foraminal disc protrusion with annular fissure at  L4-5, closely approximating and potentially irritating the exiting  left L4 nerve root.  3. Mild-to-moderate facet hypertrophy at L3-4 through L5-S1. X-ray of LLE: 1.  Mildly displaced oblique extra-articular fracture of the proximal left fibular diaphysis.   2.  Nondisplaced left lateral malleolar fracture.   3.  There is no significant osteoarthrosis.    Patient Stated Goals Pt wants to be able to walk normally again without pain and with normalized gait pattern and return to previous level of function.    Currently in Pain? Yes    Pain Score 4     Pain Location --   anterior hip, knee, ankle   Pain Orientation Left    Pain Type Chronic pain    Pain Onset More than a month ago    Aggravating Factors  walking, standing    Multiple Pain Sites Yes    Pain Score 4    Pain Location Back    Pain Orientation Right;Left;Posterior    Pain Descriptors / Indicators Aching;Sore;Tender    Pain Type Chronic pain    Pain Radiating Towards hips anteriorlly    Pain Onset More than a month ago    Pain Frequency Intermittent    Aggravating Factors  worse  with sitting or lying in certain positions    Pain Relieving Factors rest, heat    Effect of Pain on Daily Activities decreased tolerance for certain positions    Pain Onset More than a month ago                [facility] PT Assessment - 08/16/21 0001       ROM / Strength   AROM / PROM / Strength AROM;Strength      AROM   AROM Assessment Site Ankle    Right/Left Ankle Left    Left Ankle Dorsiflexion 2    Left Ankle Plantar Flexion 35    Left  Ankle Inversion 15    Left Ankle Eversion 10      Strength   Strength Assessment Site Ankle    Right/Left Ankle Left    Left Ankle Dorsiflexion 4/5    Left Ankle Plantar Flexion 4+/5    Left Ankle Inversion 4/5    Left Ankle Eversion 4/5               Physical therapy treatment session today consisted of completing assessment of goals and administration of testing as demonstrated in flow sheet and goals sections of note. Addition treatments may be found below.   Therex:   Parallel bar weight shifting without UE support x 10 A/P weight shifting in staggered stance X 10 lateral weight shifting without UE support -knee pain noted with staggered stance but overall good tolerance for this exercise.                     PT Education - 08/16/21 0951     Education Details Educated regarding progress with several goals as measured and assessed today    Person(s) Educated Patient    Methods Explanation;Demonstration    Comprehension Verbalized understanding;Returned demonstration              PT Short Term Goals - 08/16/21 1002       PT SHORT TERM GOAL #1   Title Patient will be independent in home exercise program to improve strength/mobility for better functional independence with ADLs.    Baseline 10/26: HEP given, Pt is independent and completes HEP regularly    Time 4    Period Weeks    Status Achieved    Target Date 08/08/21      PT SHORT TERM GOAL #2   Title Patient will report a worse VAS of 4/10  in LLE for improved tolerance of mobility and quality of life.    Baseline 4 of 10 in seated position but patient reports pain is up to 6-7 out of 10 at times.    Time 4    Period Weeks    Status On-going    Target Date 09/13/21               PT Long Term Goals - 08/16/21 1003       PT LONG TERM GOAL #1   Title Patient will increase FOTO score to equal to or greater than 47%    to demonstrate statistically significant improvement in mobility and quality of life.    Baseline 10/26: 5%, 12/1:26.38%    Time 8    Period Weeks    Status New    Target Date 09/27/21      PT LONG TERM GOAL #2   Title Patient will increase 10 meter walk test to >1.82ms as to improve gait speed for better community ambulation and to reduce fall risk.    Baseline 10/26: unable to ambulate at this time 12/1:.426m with RW,.4074mwith cane    Time 8    Period Weeks    Status Partially Met    Target Date 09/27/21      PT LONG TERM GOAL #3   Title Patient will increase BLE gross strength to 4+/5 as to improve functional strength for independent gait, increased standing tolerance and increased ADL ability.    Baseline 10/26: see note, 12/1: Improved knee and hip strength to 4+/5 in seated position, ankle DF, EV and Inv still require strengthenigng, see flowsheets for more info    Time  8    Period Weeks    Status Partially Met      PT LONG TERM GOAL #4   Title Patient will tolerate weightbearing for >30 minutes for increased mobility and ambulation to increase indepenence with ADLs and iADLs    Baseline 10/26; able to tolerate approximately 15 minutes    Time 8    Period Weeks    Status Partially Met      PT LONG TERM GOAL #5   Title Patient will increase ankle ROM to 10 degrees within normal range for improved gait mechanics and reduced pain.    Baseline 10/26: see note, improved will all ROM since initial eval, see note for specific details    Time 8    Period Weeks    Status On-going      PT  LONG TERM GOAL #6   Title Pt will decrease worst low back pain as reported on NPRS by at least 2 points in order to demonstrate clinically significant reduction in back pain.    Baseline 08/08/2021= LBP at worst = 7/10    Time 4    Period Weeks    Status New                   Plan - 08/16/21 7253     Clinical Impression Statement Patient presents to physical therapy following 9 physical therapy visits for a progress note.  Patient has made significant progress towards all of her goals.  Patient is experiencing less pain although pain is still present in her low back region, hip region, knee region, as well as her foot and ankle.  Patient has a significant progress with her ability to ambulate is now able to ambulate with a smooth step through gait pattern utilizing a walker and has began to transition to ambulating with a hurricane.  Patient has good balance ambulating with her cane but does have increased pain in the knee and ankle regions with this activity.  Patient was recently evaluated in our clinic for her lower back region and although she has had some improvements utilizing heat and stretching still having significant pain and discomfort in the low back region with prolonged positioning and certain positions.  Patient made significant progress with ankle range of motion and ankle strength although patient still has some room for improvement with all motions as well as with her strength particularly with eversion strength on the left lower extremity.  With 10 m walk test patient able to ambulate with a smooth gait pattern utilizing walker although speed is not at safe community ambulation level patient continue to make progress with this task was also able to complete with a cane during today's session.  Patient also making good progress with therapy FOTO score indicating improved perceived functional ability.  Patient will continue to benefit from skilled physical therapy intervention in  order to improve her lower extremity strength, ambulatory capacity, pain, as well as improve her overall function in order to restore her to her prior level of function and subsequently improve quality of life.    Personal Factors and Comorbidities Age;Comorbidity 3+;Past/Current Experience;Sex;Time since onset of injury/illness/exacerbation;Transportation    Comorbidities HLD, hypothyroidism, ostepenia, osteoporis, SVT, clotting disorder    Examination-Activity Limitations Bathing;Bed Mobility;Bend;Caring for [employer];Locomotion Level;Lift;Hygiene/Grooming;Squat;Stairs;Stand;Toileting;Transfers    Examination-Participation Restrictions Church;Cleaning;Community Activity;Driving;Interpersonal Relationship;Personal Finances;Meal Prep;Laundry;Shop;Volunteer;Yard Work;Occupation    Stability/Clinical Decision Making Evolving/Moderate complexity    Rehab Potential Fair    PT Frequency 2x / week  PT Duration 8 weeks    PT Treatment/Interventions ADLs/Self Care Home Management;Aquatic Therapy;Biofeedback;Cryotherapy;Electrical Stimulation;Iontophoresis 53m/ml Dexamethasone;Moist Heat;Ultrasound;DME Instruction;Gait training;Stair training;Functional mobility training;Neuromuscular re-education;Cognitive remediation;Balance training;Therapeutic exercise;Therapeutic activities;Patient/family education;Orthotic Fit/Training;Manual techniques;Compression bandaging;Manual lymph drainage;Scar mobilization;Passive range of motion;Energy conservation;Splinting;Taping;Vasopneumatic Device;Visual/perceptual remediation/compensation    PT Next Visit Plan WB activities, Gait and balance training as appropriate; Progressive LE strength and ROM activities. Review and progress Low back/knee gentle stretching, looking into IT band and quad muscle for improvement in knee pain and function.    PT Home Exercise Plan encouraged weight shifting without UE assistance as part of HEP.     Consulted and Agree with Plan of Care Patient             Patient will benefit from skilled therapeutic intervention in order to improve the following deficits and impairments:  Abnormal gait, Cardiopulmonary status limiting activity, Decreased activity tolerance, Decreased balance, Decreased knowledge of precautions, Decreased endurance, Decreased knowledge of use of DME, Decreased range of motion, Decreased mobility, Difficulty walking, Decreased strength, Hypomobility, Increased edema, Impaired flexibility, Impaired perceived functional ability, Increased muscle spasms, Improper body mechanics, Pain  Visit Diagnosis: Abnormality of gait and mobility  Difficulty in walking, not elsewhere classified  Unsteadiness on feet  Chronic bilateral low back pain without sciatica  Pain in left lower leg     Problem List Patient Active Problem List   Diagnosis Date Noted   [place]STEMI (non-ST elevated [place] infarction) [place] 07/14/[place]   Pulmonary nodule less than 6 mm determined by computed tomography of lung 07/14/2021   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 tract infection 04/19/2021   Hepatic hemangioma 06/02/2020   Constipation 06/02/2020   Left ankle sprain 03/25/2018   Foot injury, left, initial encounter 03/25/2018   Osteoporosis of lumbar spine 02/19/2018   Subcutaneous nodule 09/16/2017   Pain and swelling of lower leg 09/04/2017   Nevus 09/04/2017   Pain of both hip joints 06/03/2017   Need for hepatitis C screening test 05/22/2017   SVT s/p ablation 01/09/2010    CParticia Lather PT 08/16/2021, 11:46 AM  [place] [address]Phone: [phone]  Fax:  [phone] Name: Michelle DUSENBURYMRN: [id number] of Birth: 112-Feb-1962

## 2021-08-16 NOTE — Telephone Encounter (Signed)
Scheduled from waitlist .   Patient unable to drive at this time due to injury       Patient Consent for Virtual Visit        Michelle Armstrong has provided verbal consent on 08/16/2021 for a virtual visit (video or telephone).   CONSENT FOR VIRTUAL VISIT FOR:  Michelle Armstrong  By participating in this virtual visit I agree to the following:  I hereby voluntarily request, consent and authorize [facility] and its employed or contracted physicians, physician assistants, nurse practitioners or other licensed health care professionals (the Practitioner), to provide me with telemedicine health care services (the "Services") as deemed necessary by the treating Practitioner. I acknowledge and consent to receive the Services by the Practitioner via telemedicine. I understand that the telemedicine visit will involve communicating with the Practitioner through live audiovisual communication technology and the disclosure of certain medical information by electronic transmission. I acknowledge that I have been given the opportunity to request an in-person assessment or other available alternative prior to the telemedicine visit and am voluntarily participating in the telemedicine visit.  I understand that I have the right to withhold or withdraw my consent to the use of telemedicine in the course of my care at any time, without affecting my right to future care or treatment, and that the Practitioner or I may terminate the telemedicine visit at any time. I understand that I have the right to inspect all information obtained and/or recorded in the course of the telemedicine visit and may receive copies of available information for a reasonable fee.  I understand that some of the potential risks of receiving the Services via telemedicine include:  Delay or interruption in medical evaluation due to technological equipment failure or disruption; Information transmitted may not be sufficient (e.g.  poor resolution of images) to allow for appropriate medical decision making by the Practitioner; and/or  In rare instances, security protocols could fail, causing a breach of personal health information.  Furthermore, I acknowledge that it is my responsibility to provide information about my medical history, conditions and care that is complete and accurate to the best of my ability. I acknowledge that Practitioner's advice, recommendations, and/or decision may be based on factors not within their control, such as incomplete or inaccurate data provided by me or distortions of diagnostic images or specimens that may result from electronic transmissions. I understand that the practice of medicine is not an exact science and that Practitioner makes no warranties or guarantees regarding treatment outcomes. I acknowledge that a copy of this consent can be made available to me via my patient portal ([place]), or I can request a printed copy by calling the office of [facility].    I understand that my insurance will be billed for this visit.   I have read or had this consent read to me. I understand the contents of this consent, which adequately explains the benefits and risks of the Services being provided via telemedicine.  I have been provided ample opportunity to ask questions regarding this consent and the Services and have had my questions answered to my satisfaction. I give my informed consent for the services to be provided through the use of telemedicine in my medical care

## 2021-08-16 NOTE — Progress Notes (Signed)
Virtual Visit via Telephone Note   This visit type was conducted due to national recommendations for restrictions regarding the COVID-19 Pandemic (e.g. social distancing) in an effort to limit this patient's exposure and mitigate transmission in our community.  Due to her co-morbid illnesses, this patient is at least at moderate risk for complications without adequate follow up.  This format is felt to be most appropriate for this patient at this time.  The patient did not have access to video technology/had technical difficulties with video requiring transitioning to audio format only (telephone).  All issues noted in this document were discussed and addressed.  No physical exam could be performed with this format.  Please refer to the patient's chart for her  consent to telehealth for [facility].    Date:  08/16/2021   ID:  Michelle Armstrong, DOB 1961-05-20, MRN [id number] The patient was identified using 2 identifiers.  Patient Location: Home Provider Location: Office/Clinic   PCP:  Michelle Haven, MD   [facility] Providers Cardiologist:  Michelle Bush, MD {  Evaluation Performed:  Follow-Up Visit  Chief Complaint:  Virtual hospital follow up  History of Present Illness:    Michelle Armstrong is a 60 y.o. female with normal coronary arteries by LHC in 02/2017, SVT status post ablation in 2011 with recurrence of palpitation several years later, HLD, PAI-1 (5G 5G), cervical radiculopathy, mechanical fall with fibular fracture of the left lower extremity, grade 2 ankle sprain, and L1 compression fracture followed by orthopedics, and obesity status post gastric bypass surgery who is seen via telehealth today for hospital follow-up.  She has a history of SVT status post ablation in 2011.  In 2015, she redeveloped intermittent palpitations and was medically managed with recommendation to continue to monitor symptoms.  She was admitted in 02/2017 with chest pain and mild  troponin elevation peaking at 0.67.  Echo showed an EF of 55 to 60%, normal wall motion, normal LV diastolic function parameters, and trivial mitral regurgitation.  LHC demonstrated normal coronary arteries without evidence of obstructive disease.  There was mildly sluggish flow in the left coronary tree, particularly the LAD, and normal LVEDP.  Note mentions concern for possible endothelial dysfunction.    More recently, she suffered a mechanical fall resulting in left lower extremity fibular fracture, grade 2 ankle sprain, and L1 compression fracture.  As part of her treatment for this she was prescribed steroids and advised to hold aspirin.  In this context, she developed 2 separate episodes of left-sided chest pain with associated tachypalpitations, with the second episode being significantly more intense and not associated with palpitations.  Given symptoms, she presented to [facility] ED on 10/29, with stable vitals and EKG demonstrating NSR, 84 bpm, and no acute ST-T changes.  Sinus rhythm showed sinus tachycardia with rates into the 120s bpm.  High-sensitivity troponin less than 2 with a delta troponin of 163 and ultimately peaked at 300 with subsequent downtrend.  D-dimer was mildly elevated with CTA chest negative for PE.  Chest x-ray was without acute cardiopulmonary process.  Symptoms improved with aspirin and SL NTG x2.  She was placed on a heparin drip.  At time of cardiology consult she was chest pain-free.  Echo on 07/15/2021 showed an EF of 60 to 65%, no regional wall motion abnormalities, mild concentric LVH, grade 1 diastolic dysfunction, normal RV systolic function and ventricular cavity size, trivial mitral valve regurgitation, and an estimated right atrial pressure of 3 mmHg.  Initially,  plans were for patient to undergo coronary CTA, however this type of cardiac imaging is not available on the inpatient side at [facility].  Given this, plans were initially for her to be transferred to [facility] for  coronary CTA.  However, given bed availability this was felt to likely lead to a delay in transfer.  Given lack of ongoing symptoms she was discharged for outpatient coronary CTA at [facility], the same day (10/31).  Noncardiac over read of coronary CTA demonstrated a 5 mm right lower lobe pulmonary nodule.  Her PCP is aware of this and is following up her risk status to assess if further imaging is indicated.  Otherwise, there were no acute extracardiac findings.  Coronary overread demonstrated a calcium score of 0 with no significant plaque or stenosis within the coronary arteries.  Small PFO was unable to be excluded.  Of note, there was no atrial level shunt noted by color-flow Doppler on echo on 07/15/2021.  Outpatient cardiac monitoring has demonstrated a predominant rhythm of sinus with an average heart rate of 74 bpm (range 50 to 162 bpm), first-degree AV block, 1 run of SVT lasting 4 beats with a maximum rate of 162 bpm (average 147 bpm), isolated PACs, atrial couplets, atrial triplets, and PVCs were rare representing a less than 1% burden.  Since her hospital discharge, from a cardiac perspective, she has done well without further symptoms of chest pain or palpitations.  She does continue to work with PT regarding her orthopedic injuries.  No dizziness, presyncope, or syncope.  She does remain on aspirin 81 mg daily.  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 02/2010 Echo: EF 55-60%, no rwma.   Vitamin D deficiency    low   Past Surgical History:  Procedure Laterality Date   ABDOMINAL HYSTERECTOMY  1999   Total lap hyst BSO due to endometriosis   CARDIAC ELECTROPHYSIOLOGY MAPPING AND ABLATION  2011   COLONOSCOPY WITH PROPOFOL N/A 07/10/2015   Procedure: COLONOSCOPY WITH PROPOFOL;  Surgeon: Lollie Sails, MD;  Location: [facility] ENDOSCOPY;  Service: Endoscopy;  Laterality: N/A;   GALLBLADDER SURGERY     HERNIA REPAIR  12/2010   LEFT HEART CATH AND CORONARY ANGIOGRAPHY N/A 02/24/2017   Procedure: Left Heart Cath and Coronary Angiography;  Surgeon: Wellington Hampshire, MD;  Location: [place] CV LAB;  Service: Cardiovascular;  Laterality: N/A;   TONSILLECTOMY       Current Meds  Medication Sig   Ascorbic Acid (VITAMIN C) 100 MG tablet Take 100 mg by mouth daily.   aspirin EC 81 MG tablet Take 81 mg by mouth daily.   B Complex-C-Folic Acid (SUPER  B COMPLEX/FA/VIT C) TABS Take 1 tablet by mouth daily.   Calcium Carbonate 500 MG CHEW Chew 1 tablet by mouth daily.   calcium-vitamin D (OSCAL WITH D) 500-5 MG-MCG tablet Take 1 tablet by mouth daily with breakfast.   cholecalciferol (VITAMIN D) 1000 units tablet Take 1,000 Units by mouth daily.   Collagen Hydrolysate POWD 2 Scoops as directed.   ferrous sulfate 325 (65 FE) MG EC tablet Take 325 mg by mouth 3 (three) times daily with meals.   Multiple Vitamins-Minerals (MULTIVITAMIN PO) Take 1 tablet by mouth daily.   nitroGLYCERIN (NITROSTAT) 0.4 MG SL tablet Place 1 tablet (0.4 mg total) under the tongue every 5 (five) minutes as needed for chest pain.   Polyethylene Glycol 3350 (MIRALAX PO) Take 1 Scoop by mouth.   Probiotic Product (PROBIOTIC PO) Take by mouth.   Thiamine HCl (B-1 PO) Take 12.5 mg by mouth.   tiZANidine (ZANAFLEX) 4 MG tablet  Take 1 tablet (4 mg total) by mouth every 6 (six) hours as needed for muscle spasms.   Wheat Dextrin (BENEFIBER PO) Take 2 Scoops by mouth.     Allergies:   Cephalexin, Latex, Metoprolol, Nsaids, Penicillin g, and Pollen extract   Social History   Tobacco Use   Smoking status: Never   Smokeless tobacco: Never   Tobacco comments:    tobacco use- no   Vaping Use   Vaping Use: Never used  Substance Use Topics   Alcohol use: Yes    Alcohol/week: 0.0 standard drinks    Comment: rare glass of wine.   Drug use: No     Family Hx: The patient's family history includes Bladder Cancer in her father; Clotting disorder in her sister; Drug abuse in her father; Heart disease in her father; Hyperlipidemia in her father; Obesity in her father; Thyroid disease in her sister. There is no history of Breast cancer.  ROS:   Please see the history of present illness.     All other systems reviewed and are negative.   Prior CV studies:   The following studies were reviewed today:  2D echo 02/2017: - Left ventricle: The cavity size was normal. Wall thickness was    normal. Systolic function was normal. The estimated ejection    fraction was in the range of 55% to 60%. Wall motion was normal;    there were no regional wall motion abnormalities. Left    ventricular diastolic function parameters were normal. __________   LHC 02/2017: 1. Normal coronary arteries with no evidence of obstructive coronary artery disease. Mildly sluggish flow in the left coronary system especially the LAD. 2. Normal LV systolic function by echo. Left ventricular angiography was not performed. Normal left ventricular end-diastolic pressure.   Recommendations: The patient might have endothelial dysfunction. Recommend medical therapy with aspirin and a statin. Given that some of her symptoms were pleuritic, I requested d-dimer and if elevated recommend CTA of the  Chest to evaluate for PE. __________  2D echo  07/15/2021:  1. Left ventricular ejection fraction, by estimation, is 60 to 65%. The  left ventricle has normal function. The left ventricle has no regional  wall motion abnormalities. There is mild concentric left ventricular  hypertrophy. Left ventricular diastolic  parameters are consistent with Grade I diastolic dysfunction (impaired  relaxation).   2. Right ventricular systolic function is normal. The right ventricular  size is normal.   3. The mitral valve is normal in structure. Trivial mitral valve  regurgitation. No evidence  of mitral stenosis.   4. The aortic valve is normal in structure. Aortic valve regurgitation is  not visualized. No aortic stenosis is present.   5. The inferior vena cava is normal in size with greater than 50%  respiratory variability, suggesting right atrial pressure of 3 mmHg.  Labs/Other Tests and Data Reviewed:    EKG:   As outlined in the HPI.  Recent Labs: 01/12/2021: ALT 30 07/15/2021: TSH 3.992 07/16/2021: BUN 21; Creatinine, Ser 0.69; Hemoglobin 14.1; Magnesium 1.9; Platelets 251; Potassium 3.9; Sodium 137   Recent Lipid Panel Lab Results  Component Value Date/Time   CHOL 226 (H) 07/15/2021 05:28 AM   CHOL 250 (H) 01/12/2021 08:27 AM   TRIG 67 07/15/2021 05:28 AM   HDL 92 07/15/2021 05:28 AM   HDL 120 01/12/2021 08:27 AM   CHOLHDL 2.5 07/15/2021 05:28 AM   LDLCALC 121 (H) 07/15/2021 05:28 AM   LDLCALC 121 (H) 01/12/2021 08:27 AM    Wt Readings from Last 3 Encounters:  08/16/21 165 lb (74.8 kg)  07/15/21 178 lb 5.6 oz (80.9 kg)  04/19/21 162 lb (73.5 kg)     Risk Assessment/Calculations:     N/A  Objective:    Vital Signs:  BP 97/68   Pulse 80   Ht 5\' 7"  (1.702 m)   Wt 165 lb (74.8 kg)   BMI 25.84 kg/m      ASSESSMENT & PLAN:    History of chest pain with elevated troponin and mildly elevated LDL: She has been symptom-free since her hospital discharge and is doing quite well from a cardiac perspective.  Elevated  troponin possibly in the context of demand ischemia.  Prior LHC in 2018 showed normal coronary arteries.  Coronary CTA on 07/16/2021 showed a calcium score of 0 with no evidence of plaquing or stenosis.  She remains on aspirin 81 mg daily.  During her admission LDL was noted to be mildly elevated at 121 with prior recommendation for atorvastatin 40 mg, which was declined during the admission.  At this point, it is very reasonable to continue lifestyle modification and heart healthy diet, as she would prefer to avoid medications.  If symptoms of chest discomfort return, could consider addition of diltiazem for antianginal effect, this can be revisited in follow-up as indicated.  History of SVT status post ablation: Overall, she is doing well.  Repeat outpatient cardiac monitoring demonstrated a single 4 beat run of SVT following her recent hospital admission.  Not currently requiring standing AV nodal blocking medication.  Continue to monitor symptoms.    Time:   Today, I have spent 27 minutes with the patient with telehealth technology discussing the above problems.     Medication Adjustments/Labs and Tests Ordered: Current medicines are reviewed at length with the patient today.  Concerns regarding medicines are outlined above.   Tests Ordered: No orders of the defined types were placed in this encounter.   Medication Changes: No orders of the defined types were placed in this encounter.   Follow Up:  In Person in 6 month(s)  Signed, Christell Faith, PA-C  08/16/2021 5:26 PM    [place]

## 2021-08-16 NOTE — Patient Instructions (Signed)
Medication Instructions:  No changes at this time.  *If you need a refill on your cardiac medications before your next appointment, please call your pharmacy*   Lab Work: None  If you have labs (blood work) drawn today and your tests are completely normal, you will receive your results only by: [place] (if you have MyChart) OR A paper copy in the mail If you have any lab test that is abnormal or we need to change your treatment, we will call you to review the results.   Testing/Procedures: None   Follow-Up: At [facility], you and your health needs are our priority.  As part of our continuing mission to provide you with exceptional heart care, we have created designated Provider Care Teams.  These Care Teams include your primary Cardiologist (physician) and Advanced Practice Providers (APPs -  Physician Assistants and Nurse Practitioners) who all work together to provide you with the care you need, when you need it.   Your next appointment:   6 month(s)  The format for your next appointment:   In Person  Provider:   You may see Dr. Harrell Gave End or one of the following Advanced Practice Providers on your designated Care Team:   Murray Hodgkins, NP Christell Faith, PA-C Cadence Kathlen Mody, [place]

## 2021-08-16 NOTE — Patient Instructions (Signed)
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  It is my professional opinion that [facility] require reasonable accommodations for her to return to her normal job.  It is not currently recommended at this time for patient to sit with involved lower extremity in dependent position at desk throughout the day.  Would recommend a mobile workstation where patient is able to change positions and work from various locations in her home as necessary for both lower extremity edema management in addition to low back, hip, and knee pain management.  If you have any questions regarding recommendations he may contact our clinic at [phone].   Best,   Rivka Barbara PT, DPT

## 2021-08-16 NOTE — Progress Notes (Signed)
[facility] called with results from 2011. Lab was PAI-1 results were 5g-5g. Provider notified of results.

## 2021-08-20 ENCOUNTER — Other Ambulatory Visit: Payer: Self-pay

## 2021-08-20 ENCOUNTER — Ambulatory Visit: Payer: Managed Care, Other (non HMO) | Admitting: Physical Therapy

## 2021-08-20 DIAGNOSIS — R262 Difficulty in walking, not elsewhere classified: Secondary | ICD-10-CM

## 2021-08-20 DIAGNOSIS — R2681 Unsteadiness on feet: Secondary | ICD-10-CM

## 2021-08-20 DIAGNOSIS — M545 Low back pain, unspecified: Secondary | ICD-10-CM

## 2021-08-20 DIAGNOSIS — R269 Unspecified abnormalities of gait and mobility: Secondary | ICD-10-CM

## 2021-08-20 DIAGNOSIS — M79662 Pain in left lower leg: Secondary | ICD-10-CM

## 2021-08-20 NOTE — Therapy (Signed)
[place] MAIN [facility] SERVICES [address] [place], [place], [address] Phone: [phone]   Fax:  [phone]  Physical Therapy Treatment  Patient Details  Name: Michelle Armstrong MRN: [id number] Date of Birth: 1961-04-07 Referring Provider (PT): Hildred Alamin PA   Encounter Date: 08/20/2021   PT End of Session - 08/20/21 2423     Visit Number 11    Number of Visits 16    Date for PT Re-Evaluation 09/05/21    Authorization Type 4/10 eval 07/11/21    Progress Note Due on Visit 20    PT Start Time 0800    PT Stop Time 0844    PT Time Calculation (min) 44 min    Equipment Utilized During Treatment Other (comment)   SBQC, SPC   Activity Tolerance Patient limited by pain;Patient tolerated treatment well    Behavior During Therapy [facility] for tasks assessed/performed             Past Medical History:  Diagnosis Date   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 02/2010 Echo: EF 55-60%, no rwma.   Vitamin D deficiency    low    Past Surgical History:  Procedure Laterality Date   ABDOMINAL HYSTERECTOMY  1999   Total lap hyst BSO due to endometriosis   CARDIAC ELECTROPHYSIOLOGY MAPPING AND ABLATION  2011   COLONOSCOPY WITH PROPOFOL N/A 07/10/2015   Procedure: COLONOSCOPY WITH PROPOFOL;  Surgeon: Lollie Sails, MD;  Location: [facility] ENDOSCOPY;  Service: Endoscopy;  Laterality: N/A;   GALLBLADDER SURGERY     HERNIA REPAIR  12/2010   LEFT HEART CATH AND CORONARY  ANGIOGRAPHY N/A 02/24/2017   Procedure: Left Heart Cath and Coronary Angiography;  Surgeon: Wellington Hampshire, MD;  Location: [place] CV LAB;  Service: Cardiovascular;  Laterality: N/A;   TONSILLECTOMY      There were no vitals filed for this visit.   Subjective Assessment - 08/20/21 0951     Subjective Pt reports difficulty with her workplace in their ability to make reasonable accommodations for her current condition. Reports at MD visit on Friday she will be assessed if return to work is advised based on how she responds this week to returnig to work. Pt reports pain levels are about the same but her mobility continues to improve. Pt also has complaints of intermittent numbness/tingling/ discomfort in her fingers on her L UE. Pt reports the discomfort has transitioned from second and third digit to third and fourth digit. Pt encouraged to continue to monitor this for worsening of signs and symptoms.    Patient is accompained by: Family member    Pertinent History 60 yo Female s/p fall with subsequent closed non-displaced segmental fracture of left fibula on 05/17/21. Pt reports she sustained her initial injury when she was leaving a resturaunt. She reprots she was stepping onto an outdoor carpeted surface which  had a unmarked, large indentation beneath the carpet which could not be noticed. When she stepped into this area, her ankle turned and resulted in the fracture and fall. She was casted for 6 weeks and has now been given a walking boot which she will wear for 6 weeks. MD order states, "May gradually advance weight bearing as tolerated in the boot. Once full weightbearing, may wean out of boot" She also suffered an L1 compression fracture (25% loss of height). She reports orthopedic MD has not evaluated her ankle. She has a referral to [facility] to evaluate ankle later today. 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 As far as she is aware  she has not had a clot at this point. She also has osteopenia. Pt is unable to don/doff boot independently due to difficulty bending over, husband helps her; Pt reports increased back pain with difficulty getting in/out of bed and has since been sleeping and living in a lift chair. PMH significant: Clotting disorder, HLD, hypothyroidism, Osteopenia, SVT, vitamin D deficiency    Limitations Sitting;Lifting;Standing;Walking;House hold activities    How long can you stand comfortably? very limited secondary to pain    How long can you walk comfortably? Very limited  but imroving with walking at home using a front wheeled walker and walking boot on left side    Diagnostic tests 10/2: MRI of lumbar spine: Evolving subacute compression fracture involving the superior  endplate of L1 with mild 25% height loss and trace 2 mm bony  retropulsion. No associated stenosis.  2. Small left foraminal disc protrusion with annular fissure at  L4-5, closely approximating and potentially irritating the exiting  left L4 nerve root.  3. Mild-to-moderate facet hypertrophy at L3-4 through L5-S1. X-ray of LLE: 1.  Mildly displaced oblique extra-articular fracture of the proximal left fibular diaphysis.   2.  Nondisplaced left lateral malleolar fracture.   3.  There is no significant osteoarthrosis.    Patient Stated Goals Pt wants to be able to walk normally again without pain and with normalized gait pattern and return to previous level of function.    Currently in Pain? Yes    Pain Score 4     Pain Onset More than a month ago    Pain Onset More than a month ago    Pain Onset More than a month ago             Exercise/Activity Sets x Reps Resistance Assistance  Comments  Ambulation in parallel bars  x5 laps   Unilateral UE assist (R) -improved pattern of gait, smoother pattern, continued discomfort in the L knee  Step with R LE X2 reps  No UE assist - pain with transition from step forward to neutral, transitioned to  step to gait pattern and the pain was not as severe   Step to gait pattern in parallel bars  X 4 laps   No UE assist -some knee and pain noted but pt able to complete task -step to gait pattern (per PT instruction)   Manual therapy: stretching extension of toes on L LE (primarily second and third digits)  X 2 min    -discomfort noted intermittently in areas of toe flexor and extensor muscle bellies in lower leg on the left as well as in the dorsal aspect   Manual therapy: ankle DF stretch  X 30 sec    - stretch felt in area of  gastrocnemius muscle   Ankle 4-way 10 x (YTB)  10 x RTB Thera band PT held for resistance  - increased to red TB with all movements   Ankle ABCs 1 x A-Z YTB   Added YTB for increased challenge / resistance   Manual therapy: soft tissue mobilization, trigger point release and cross friction massage for ITB on the left  X 5 min   -significant trigger points and tender areas to palpation noted   Standing ITB stretch in // bars  X 30 sec ea side   -cues for proper positioning, pain felt in contralateral lower back as well along ITB                                          PT Education - 08/20/21 1240     Education Details Educated to progress with resistance with ankle 4-way exercises and with ankle ABCs    Person(s) Educated Patient    Methods Explanation    Comprehension Verbalized understanding;Returned demonstration              PT Short Term Goals - 08/16/21 1002       PT SHORT TERM GOAL #1   Title Patient will be independent in home exercise program to improve strength/mobility for better functional independence with ADLs.    Baseline 10/26: HEP given, Pt is independent and completes HEP regularly    Time 4    Period Weeks    Status Achieved    Target Date 08/08/21      PT SHORT TERM GOAL #2   Title Patient will report a worse VAS of 4/10 in LLE for improved tolerance of mobility and quality of life.    Baseline 4 of 10 in  seated position but patient reports pain is up to 6-7 out of 10 at times.    Time 4    Period Weeks    Status On-going    Target Date 09/13/21               PT Long Term Goals - 08/16/21 1003       PT LONG TERM GOAL #1   Title Patient will increase FOTO score to equal to or greater than 47%    to demonstrate statistically significant improvement in mobility and quality of life.    Baseline 10/26: 5%, 12/1:26.38%    Time 8    Period Weeks    Status New    Target Date 09/27/21      PT LONG TERM GOAL #2   Title Patient will increase 10 meter walk test to >1.32ms as to improve gait speed for better community ambulation and to reduce fall risk.    Baseline 10/26: unable to ambulate at this time 12/1:.431m with RW,.4027mwith cane    Time 8    Period Weeks    Status Partially Met    Target Date 09/27/21      PT LONG TERM GOAL #3   Title Patient will increase BLE gross strength to 4+/5 as to improve functional strength for independent gait, increased standing tolerance and increased ADL ability.    Baseline 10/26: see note, 12/1: Improved knee and hip strength to 4+/5 in seated position, ankle DF, EV and Inv still require strengthenigng, see flowsheets for more info    Time 8    Period Weeks    Status Partially  Met      PT LONG TERM GOAL #4   Title Patient will tolerate weightbearing for >30 minutes for increased mobility and ambulation to increase indepenence with ADLs and iADLs    Baseline 10/26; able to tolerate approximately 15 minutes    Time 8    Period Weeks    Status Partially Met      PT LONG TERM GOAL #5   Title Patient will increase ankle ROM to 10 degrees within normal range for improved gait mechanics and reduced pain.    Baseline 10/26: see note, improved will all ROM since initial eval, see note for specific details    Time 8    Period Weeks    Status On-going      PT LONG TERM GOAL #6   Title Pt will decrease worst low back pain as reported on NPRS by  at least 2 points in order to demonstrate clinically significant reduction in back pain.    Baseline 08/08/2021= LBP at worst = 7/10    Time 4    Period Weeks    Status New                   Plan - 08/20/21 1241     Clinical Impression Statement Pt presents to thrapy with continued excellent motivation for completion of physical therapy interventions. Pt was able to take several steps in parallel bars today without UE assistance indicating continued improvement in funcitonal mobility. Pt has also made progress with her gait pattern while ambulating with a cane. Pt had significant tightness and tender to palpation areas along her left IT band which were addressed with cross friction massage and trigger point release in session today. Pt also shown stretch for this area on both sides but this was not yet added to her formal HEP.Patient will continue to benefit from skilled physical therapy intervention in order to improve her lower extremity strength, ambulatory capacity, pain, as well as improve her overall function in order to restore her to her prior level of function and subsequently improve quality of life.    Personal Factors and Comorbidities Age;Comorbidity 3+;Past/Current Experience;Sex;Time since onset of injury/illness/exacerbation;Transportation    Comorbidities HLD, hypothyroidism, ostepenia, osteoporis, SVT, clotting disorder    Examination-Activity Limitations Bathing;Bed Mobility;Bend;Caring for [employer];Locomotion Level;Lift;Hygiene/Grooming;Squat;Stairs;Stand;Toileting;Transfers    Examination-Participation Restrictions Church;Cleaning;Community Activity;Driving;Interpersonal Relationship;Personal Finances;Meal Prep;Laundry;Shop;Volunteer;Yard Work;Occupation    Stability/Clinical Decision Making Evolving/Moderate complexity    Rehab Potential Fair    PT Frequency 2x / week    PT Duration 8 weeks    PT Treatment/Interventions ADLs/Self  Care Home Management;Aquatic Therapy;Biofeedback;Cryotherapy;Electrical Stimulation;Iontophoresis 44m/ml Dexamethasone;Moist Heat;Ultrasound;DME Instruction;Gait training;Stair training;Functional mobility training;Neuromuscular re-education;Cognitive remediation;Balance training;Therapeutic exercise;Therapeutic activities;Patient/family education;Orthotic Fit/Training;Manual techniques;Compression bandaging;Manual lymph drainage;Scar mobilization;Passive range of motion;Energy conservation;Splinting;Taping;Vasopneumatic Device;Visual/perceptual remediation/compensation    PT Next Visit Plan WB activities, Gait and balance training as appropriate; Progressive LE strength and ROM activities. Review and progress Low back/knee gentle stretching, looking into IT band and quad muscle for improvement in knee pain and function.    PT Home Exercise Plan encouraged weight shifting without UE assistance as part of HEP.    Consulted and Agree with Plan of Care Patient             Patient will benefit from skilled therapeutic intervention in order to improve the following deficits and impairments:  Abnormal gait, Cardiopulmonary status limiting activity, Decreased activity tolerance, Decreased balance, Decreased knowledge of precautions, Decreased endurance, Decreased knowledge of use of DME, Decreased range of motion, Decreased  mobility, Difficulty walking, Decreased strength, Hypomobility, Increased edema, Impaired flexibility, Impaired perceived functional ability, Increased muscle spasms, Improper body mechanics, Pain  Visit Diagnosis: Abnormality of gait and mobility  Difficulty in walking, not elsewhere classified  Unsteadiness on feet  Chronic bilateral low back pain without sciatica  Pain in left lower leg     Problem List Patient Active Problem List   Diagnosis Date Noted   NSTEMI (non-ST elevated myocardial infarction) ([place]) 07/14/2021   Pulmonary nodule less than 6 mm determined by  computed tomography of lung 07/14/2021   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 tract infection 04/19/2021   Hepatic hemangioma 06/02/2020   Constipation 06/02/2020   Left ankle sprain 03/25/2018   Foot injury, left, initial encounter 03/25/2018   Osteoporosis of lumbar spine 02/19/2018   Subcutaneous nodule 09/16/2017   Pain and swelling of lower leg 09/04/2017   Nevus 09/04/2017   Pain of both hip joints 06/03/2017   Need for hepatitis C screening test 05/22/2017   SVT s/p ablation 01/09/2010    Particia Lather, PT 08/20/2021, 12:46 PM  [place] [address] [place], [place], [address] Phone: [phone]   Fax:  [phone]  Name: Michelle Armstrong MRN: [id number] Date of Birth: 1961/04/17

## 2021-08-22 ENCOUNTER — Other Ambulatory Visit: Payer: Self-pay

## 2021-08-22 ENCOUNTER — Ambulatory Visit: Payer: Managed Care, Other (non HMO)

## 2021-08-22 DIAGNOSIS — R269 Unspecified abnormalities of gait and mobility: Secondary | ICD-10-CM

## 2021-08-22 DIAGNOSIS — M545 Low back pain, unspecified: Secondary | ICD-10-CM

## 2021-08-22 DIAGNOSIS — R2681 Unsteadiness on feet: Secondary | ICD-10-CM

## 2021-08-22 DIAGNOSIS — M6281 Muscle weakness (generalized): Secondary | ICD-10-CM

## 2021-08-22 DIAGNOSIS — R262 Difficulty in walking, not elsewhere classified: Secondary | ICD-10-CM

## 2021-08-22 DIAGNOSIS — M79605 Pain in left leg: Secondary | ICD-10-CM

## 2021-08-22 NOTE — Therapy (Signed)
[place] MAIN [facility] SERVICES [address] [place], [place], [address] Phone: [phone]   Fax:  [phone]  Physical Therapy Treatment  Patient Details  Name: Michelle Armstrong MRN: [id number] Date of Birth: 1961-08-25 Referring Provider (PT): Hildred Alamin PA   Encounter Date: 08/22/2021   PT End of Session - 08/22/21 0953     Visit Number 12    Number of Visits 16    Date for PT Re-Evaluation 09/05/21    Authorization Type 4/10 eval 07/11/21    Progress Note Due on Visit 20    PT Start Time 0800    PT Stop Time 0300    PT Time Calculation (min) 44 min    Equipment Utilized During Treatment Other (comment);Gait belt   SBQC, SPC   Activity Tolerance Patient tolerated treatment well    Behavior During Therapy [facility] for tasks assessed/performed             Past Medical History:  Diagnosis Date   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 02/2010 Echo: EF 55-60%, no rwma.   Vitamin D deficiency    low    Past Surgical History:  Procedure Laterality Date   ABDOMINAL HYSTERECTOMY  1999   Total lap hyst BSO due to endometriosis   CARDIAC ELECTROPHYSIOLOGY MAPPING AND ABLATION  2011   COLONOSCOPY WITH PROPOFOL N/A 07/10/2015   Procedure: COLONOSCOPY WITH PROPOFOL;  Surgeon: Lollie Sails, MD;  Location: [facility] ENDOSCOPY;  Service: Endoscopy;  Laterality: N/A;   GALLBLADDER SURGERY     HERNIA REPAIR  12/2010   LEFT HEART CATH AND CORONARY ANGIOGRAPHY N/A  02/24/2017   Procedure: Left Heart Cath and Coronary Angiography;  Surgeon: Wellington Hampshire, MD;  Location: [place] CV LAB;  Service: Cardiovascular;  Laterality: N/A;   TONSILLECTOMY      There were no vitals filed for this visit.   Subjective Assessment - 08/22/21 0945     Subjective Pt reports that she continues to work hard to push herself to improve her mobility. States she has follow up with Dr. Mack Guise on Friday. She states continued pain in left ankle, Left knee, Bilateral hips/upper thighs, and low back. She also states intermittent numbness and tingling into left hand-1st/2nd digits and then into 3rd and 4th digits.    Patient is accompained by: Family member    Pertinent History 60 yo Female s/p fall with subsequent closed non-displaced segmental fracture of left fibula on 05/17/21. Pt reports she sustained her initial injury when she was leaving a resturaunt. She reprots she was stepping onto an outdoor carpeted surface which had a unmarked, large indentation beneath the carpet which could not be noticed. When she stepped into this area, her ankle turned and resulted in the fracture and fall. She was casted for 6 weeks and has now been given a walking boot which she will  wear for 6 weeks. MD order states, "May gradually advance weight bearing as tolerated in the boot. Once full weightbearing, may wean out of boot" She also suffered an L1 compression fracture (25% loss of height). She reports orthopedic MD has not evaluated her ankle. She has a referral to [facility] to evaluate ankle later today. 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 As far as she is aware she has not had a clot at this point. She also has osteopenia. Pt is unable to don/doff boot independently due to difficulty bending over, husband helps her; Pt reports increased back pain with difficulty getting in/out of bed and has since been sleeping and living in a lift  chair. PMH significant: Clotting disorder, HLD, hypothyroidism, Osteopenia, SVT, vitamin D deficiency    Limitations Sitting;Lifting;Standing;Walking;House hold activities    How long can you stand comfortably? very limited secondary to pain    How long can you walk comfortably? Very limited  but imroving with walking at home using a front wheeled walker and walking boot on left side    Diagnostic tests 10/2: MRI of lumbar spine: Evolving subacute compression fracture involving the superior  endplate of L1 with mild 25% height loss and trace 2 mm bony  retropulsion. No associated stenosis.  2. Small left foraminal disc protrusion with annular fissure at  L4-5, closely approximating and potentially irritating the exiting  left L4 nerve root.  3. Mild-to-moderate facet hypertrophy at L3-4 through L5-S1. X-ray of LLE: 1.  Mildly displaced oblique extra-articular fracture of the proximal left fibular diaphysis.   2.  Nondisplaced left lateral malleolar fracture.   3.  There is no significant osteoarthrosis.    Patient Stated Goals Pt wants to be able to walk normally again without pain and with normalized gait pattern and return to previous level of function.    Currently in Pain? Yes   Patient reports ongoing left anke, knee, bilateral upper thigh/hip region and low back. States she is not painfree and has some level of pain and tries to push herself as long as not too bad- "like over a 5 or 6/10"              INTERVENTIONS:   Therapeutic Exercises:   Gait in // bars:  - RUE support forward gait in bars - focusing on reciprocal steps and erect posture- Patient able to perform well - no verbal cues for reciprocal steps and no limp with use of right UE support on bars. Patient does endorse some left knee pain and ankle pain with walking yet able to walk 4 times down and back - well today- Declining need to rest.  -assessed gait in bars without UE support- down and back approx 4 times- definite  increase in limp -yet patient still able to walk with reciprocal steps - "It hurts but I can do it." Patient again performed well with no actual cues for technique. Advised her to continue to walk with cane for now as pain allows.   - Gait with use of single point cane on right side and walking boot donned on left- approx 175 feet today with patient exhibiting Short reciprocal steps with supervision and no loss of balance.   -Sit to stand from regular height chair with B hands on thighs- able to perform Independently - x 10 reps total today. *Patient exhibited good eccentric control with min initial VC's.   -(Instructed in and  patient performed) Seated hamstring curl with non-latex GTB BLE x 10 reps each. Patient able to quickly grasp concept of technique and able to perform well with good control.   -(Instructed in and Patient performed) Seated hip abd with non latex GTB tied around distal thighs- 1 leg at a time x 10 reps each. Patient did report pain during exercise (Reviewed importance of pain control/management with exercises- Patient reports that she cannot perform the exercises pain free and always had some pain but expressed knowledgeable  to not to exceed a 5-6/10. She was able to complete well not exceeding her threshold.   - (Reviewed Line of pull with using Red theraband with ankle strengthening specifically DF/IV). Both patient and her husband verbalized understanding of specific technique so she can effectively perform at home. Then instructed her in seated resistive DF using RTB (towel placed under left foot)-so she would be able to perform the exercise without assist of husband- She did complain of more Left ankle pain with seated technique vs. Open chain method she has been performing.   -Instructed patient in pain management strategy today including use of massage stick for self management of tight musculature/pain relief. She then utilized the stick rubbing bilateral Hips reporting  "feels pretty good." She did try lower down on legs  including anterior tib and calf region but states she she did not like the sensation against her bare skin and would prefer a barrier. Also discussed other options including massage drill and patient able to look up options online.     Education provided throughout session via VC/TC and demonstration to facilitate movement at target joints and correct muscle activation for all testing and exercises performed.      --                          PT Education - 08/22/21 [phone]     Education Details Exercise technique with resistive band    Person(s) Educated Patient;Spouse    Methods Explanation;Demonstration;Tactile cues;Verbal cues    Comprehension Verbalized understanding;Returned demonstration;Verbal cues required;Tactile cues required;Need further instruction              PT Short Term Goals - 08/16/21 1002       PT SHORT TERM GOAL #1   Title Patient will be independent in home exercise program to improve strength/mobility for better functional independence with ADLs.    Baseline 10/26: HEP given, Pt is independent and completes HEP regularly    Time 4    Period Weeks    Status Achieved    Target Date 08/08/21      PT SHORT TERM GOAL #2   Title Patient will report a worse VAS of 4/10 in LLE for improved tolerance of mobility and quality of life.    Baseline 4 of 10 in seated position but patient reports pain is up to 6-7 out of 10 at times.    Time 4    Period Weeks    Status On-going    Target Date 09/13/21               PT Long Term Goals - 08/16/21 1003       PT LONG TERM GOAL #1   Title Patient will increase FOTO score to equal to or greater than 47%    to demonstrate statistically significant improvement in mobility and quality of life.    Baseline 10/26: 5%, 12/1:26.38%    Time 8  Period Weeks    Status New    Target Date 09/27/21      PT LONG TERM GOAL #2   Title Patient will  increase 10 meter walk test to >1.21ms as to improve gait speed for better community ambulation and to reduce fall risk.    Baseline 10/26: unable to ambulate at this time 12/1:.447m with RW,.4046mwith cane    Time 8    Period Weeks    Status Partially Met    Target Date 09/27/21      PT LONG TERM GOAL #3   Title Patient will increase BLE gross strength to 4+/5 as to improve functional strength for independent gait, increased standing tolerance and increased ADL ability.    Baseline 10/26: see note, 12/1: Improved knee and hip strength to 4+/5 in seated position, ankle DF, EV and Inv still require strengthenigng, see flowsheets for more info    Time 8    Period Weeks    Status Partially Met      PT LONG TERM GOAL #4   Title Patient will tolerate weightbearing for >30 minutes for increased mobility and ambulation to increase indepenence with ADLs and iADLs    Baseline 10/26; able to tolerate approximately 15 minutes    Time 8    Period Weeks    Status Partially Met      PT LONG TERM GOAL #5   Title Patient will increase ankle ROM to 10 degrees within normal range for improved gait mechanics and reduced pain.    Baseline 10/26: see note, improved will all ROM since initial eval, see note for specific details    Time 8    Period Weeks    Status On-going      PT LONG TERM GOAL #6   Title Pt will decrease worst low back pain as reported on NPRS by at least 2 points in order to demonstrate clinically significant reduction in back pain.    Baseline 08/08/2021= LBP at worst = 7/10    Time 4    Period Weeks    Status New                   Plan - 08/22/21 0954     Clinical Impression Statement Patient continues to present with excellent motivation during session- responsive to all techniques and cues for safe mobility and exercise. She continues to have pain throughout session yet pushes through to achieve tasks. She was able to ambulate well again with use of SPC today.  Patient has MD visit on Friday and PT will follow up to see if any upgrades from Ortho. She excelled with gait requiring no cues for sequencing today and functionally doing well despite ongoing pain. Patient will continue to benefit from skilled Physical Therapy interventions in order to improve her overall LE muscle strength, functional mobility, improve pain and improve functional capabilities in home for improved quality of life and ability to return to prior level of function.    Personal Factors and Comorbidities Age;Comorbidity 3+;Past/Current Experience;Sex;Time since onset of injury/illness/exacerbation;Transportation    Comorbidities HLD, hypothyroidism, ostepenia, osteoporis, SVT, clotting disorder    Examination-Activity Limitations Bathing;Bed Mobility;Bend;Caring for [employer] Level;Lift;Hygiene/Grooming;Squat;Stairs;Stand;Toileting;Transfers    Examination-Participation Restrictions Church;Cleaning;Community Activity;Driving;Interpersonal Relationship;Personal Finances;Meal Prep;Laundry;Shop;Volunteer;Yard Work;Occupation    Stability/Clinical Decision Making Evolving/Moderate complexity    Rehab Potential Fair    PT Frequency 2x / week    PT Duration 8 weeks    PT Treatment/Interventions ADLs/Self Care Home Management;Aquatic Therapy;Biofeedback;Cryotherapy;Electrical Stimulation;Iontophoresis 4mg68m  Dexamethasone;Moist Heat;Ultrasound;DME Instruction;Gait training;Stair training;Functional mobility training;Neuromuscular re-education;Cognitive remediation;Balance training;Therapeutic exercise;Therapeutic activities;Patient/family education;Orthotic Fit/Training;Manual techniques;Compression bandaging;Manual lymph drainage;Scar mobilization;Passive range of motion;Energy conservation;Splinting;Taping;Vasopneumatic Device;Visual/perceptual remediation/compensation    PT Next Visit Plan WB activities, Gait and balance training as appropriate;  Progressive LE strength and ROM activities. Review and progress Low back/knee gentle stretching, looking into IT band and quad muscle for improvement in knee pain and function.    PT Home Exercise Plan encouraged weight shifting without UE assistance as part of HEP. 12/7/202- Issued non-latex GTB for hip abd and seated hamstring activity    Consulted and Agree with Plan of Care Patient             Patient will benefit from skilled therapeutic intervention in order to improve the following deficits and impairments:  Abnormal gait, Cardiopulmonary status limiting activity, Decreased activity tolerance, Decreased balance, Decreased knowledge of precautions, Decreased endurance, Decreased knowledge of use of DME, Decreased range of motion, Decreased mobility, Difficulty walking, Decreased strength, Hypomobility, Increased edema, Impaired flexibility, Impaired perceived functional ability, Increased muscle spasms, Improper body mechanics, Pain  Visit Diagnosis: Abnormality of gait and mobility  Difficulty in walking, not elsewhere classified  Muscle weakness (generalized)  Unsteadiness on feet  Chronic bilateral low back pain without sciatica  Pain in left leg     Problem List Patient Active Problem List   Diagnosis Date Noted   NSTEMI (non-ST elevated myocardial infarction) ([place]) 07/14/2021   Pulmonary nodule less than 6 mm determined by computed tomography of lung 07/14/2021   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 tract infection 04/19/2021   Hepatic hemangioma 06/02/2020   Constipation 06/02/2020   Left ankle sprain 03/25/2018   Foot injury, left, initial encounter 03/25/2018   Osteoporosis of lumbar spine 02/19/2018   Subcutaneous nodule 09/16/2017   Pain and swelling of lower leg 09/04/2017    [place] 09/04/2017   Pain of both hip joints 06/03/2017   Need for hepatitis C screening test 05/22/2017   SVT s/p ablation 01/09/2010    Lewis Moccasin, PT 08/22/2021, 4:50 PM  [place] MAIN [facility] SERVICES [address] [place], [place], [address] Phone: [phone]   Fax:  [phone]  Name: Michelle Armstrong MRN: [id number] Date of Birth: [birth year]

## 2021-08-27 ENCOUNTER — Other Ambulatory Visit: Payer: Self-pay

## 2021-08-27 ENCOUNTER — Ambulatory Visit: Payer: Managed Care, Other (non HMO) | Admitting: Physical Therapy

## 2021-08-27 DIAGNOSIS — M545 Low back pain, unspecified: Secondary | ICD-10-CM

## 2021-08-27 DIAGNOSIS — R269 Unspecified abnormalities of gait and mobility: Secondary | ICD-10-CM

## 2021-08-27 DIAGNOSIS — M79605 Pain in left leg: Secondary | ICD-10-CM

## 2021-08-27 DIAGNOSIS — G8929 Other chronic pain: Secondary | ICD-10-CM

## 2021-08-27 DIAGNOSIS — M79662 Pain in left lower leg: Secondary | ICD-10-CM

## 2021-08-27 DIAGNOSIS — R262 Difficulty in walking, not elsewhere classified: Secondary | ICD-10-CM

## 2021-08-27 NOTE — Therapy (Signed)
[place] MAIN [facility] SERVICES [address] [place], [place], [address] Phone: [phone]   Fax:  [phone]  Physical Therapy Treatment  Patient Details  Name: QUANASIA DEFINO MRN: [id number] Date of Birth: [birth year] Referring Provider (PT): Hildred Alamin PA   Encounter Date: 08/27/2021   PT End of Session - 08/27/21 0904     Visit Number 13    Number of Visits 16    Date for PT Re-Evaluation 09/05/21    Authorization Type 4/10 eval 07/11/21    Progress Note Due on Visit 20    PT Start Time 0800    PT Stop Time 0846    PT Time Calculation (min) 46 min    Equipment Utilized During Treatment Other (comment);Gait belt   SBQC, SPC   Activity Tolerance Patient tolerated treatment well    Behavior During Therapy [facility] for tasks assessed/performed             Past Medical History:  Diagnosis Date   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 02/2010 Echo: EF 55-60%, no rwma.   Vitamin D deficiency    low    Past Surgical History:  Procedure Laterality Date   ABDOMINAL HYSTERECTOMY  1999   Total lap hyst BSO due to endometriosis   CARDIAC ELECTROPHYSIOLOGY MAPPING AND ABLATION  2011   COLONOSCOPY WITH PROPOFOL N/A 07/10/2015   Procedure: COLONOSCOPY WITH PROPOFOL;  Surgeon: Lollie Sails, MD;  Location: [facility] ENDOSCOPY;  Service: Endoscopy;  Laterality: N/A;   GALLBLADDER SURGERY     HERNIA REPAIR  12/2010   LEFT HEART CATH AND CORONARY ANGIOGRAPHY N/A  02/24/2017   Procedure: Left Heart Cath and Coronary Angiography;  Surgeon: Wellington Hampshire, MD;  Location: [place] CV LAB;  Service: Cardiovascular;  Laterality: N/A;   TONSILLECTOMY      There were no vitals filed for this visit.   Subjective Assessment - 08/27/21 0807     Subjective Patient reports visit with Dr. Christia Reading on Friday went well.  Everything is healing properly and he is happy with her progress.  She was provided with an ASO brace and was told to utilize it as tolerated by the doctor.  Patient reports utilizing ASO brace over the weekend and although it felt good to have it on she reports she may have overused it which resulted in some pain and swelling.  Pain and swelling has since resolved at this time and is back to her baseline level prior to using ASO brace.  Patient continues to complain of upper back, neck, lower back, left lower extremity left hip left knee, and ankle pain.    Patient is accompained by: Family member    Pertinent History 60 yo Female s/p fall with subsequent closed non-displaced segmental fracture of left fibula on 05/17/21. Pt reports she sustained her initial injury when she was leaving a  resturaunt. She reprots she was stepping onto an outdoor carpeted surface which had a unmarked, large indentation beneath the carpet which could not be noticed. When she stepped into this area, her ankle turned and resulted in the fracture and fall. She was casted for 6 weeks and has now been given a walking boot which she will wear for 6 weeks. MD order states, "May gradually advance weight bearing as tolerated in the boot. Once full weightbearing, may wean out of boot" She also suffered an L1 compression fracture (25% loss of height). She reports orthopedic MD has not evaluated her ankle. She has a referral to [facility] to evaluate ankle later today. 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 As far as she is aware  she has not had a clot at this point. She also has osteopenia. Pt is unable to don/doff boot independently due to difficulty bending over, husband helps her; Pt reports increased back pain with difficulty getting in/out of bed and has since been sleeping and living in a lift chair. PMH significant: Clotting disorder, HLD, hypothyroidism, Osteopenia, SVT, vitamin D deficiency    Limitations Sitting;Lifting;Standing;Walking;House hold activities    How long can you stand comfortably? very limited secondary to pain    How long can you walk comfortably? Very limited  but imroving with walking at home using a front wheeled walker and walking boot on left side    Diagnostic tests 10/2: MRI of lumbar spine: Evolving subacute compression fracture involving the superior  endplate of L1 with mild 25% height loss and trace 2 mm bony  retropulsion. No associated stenosis.  2. Small left foraminal disc protrusion with annular fissure at  L4-5, closely approximating and potentially irritating the exiting  left L4 nerve root.  3. Mild-to-moderate facet hypertrophy at L3-4 through L5-S1. X-ray of LLE: 1.  Mildly displaced oblique extra-articular fracture of the proximal left fibular diaphysis.   2.  Nondisplaced left lateral malleolar fracture.   3.  There is no significant osteoarthrosis.    Patient Stated Goals Pt wants to be able to walk normally again without pain and with normalized gait pattern and return to previous level of function.    Currently in Pain? Yes    Pain Score 5     Pain Orientation Left    Aggravating Factors  walking, standing    Effect of Pain on Daily Activities Walking, bending    Multiple Pain Sites Yes    Pain Location Back    Pain Orientation Right;Left;Posterior;Upper;Mid;Lower    Pain Descriptors / Indicators Aching;Sore    Pain Type Chronic pain    Pain Radiating Towards hops and L UE    Pain Onset More than a month ago               Exercise/Activity Sets x Reps Resistance  Assistance  Comments  Ambulation in parallel bars  x5 laps   Unilateral UE assist (R) -improved pattern of gait, smoother pattern, continued discomfort in the L knee  Standing Hip Abduction X 10   UE on // bars  - pain noted along IT band, specifically in one area.         Manual therapy: stretching and garde 2/3 joint mobs, extension of toes on L LE (primarily first and second digits)  X 2 min    -discomfort noted intermittently in areas of toe flexor and extensor muscle bellies in lower leg on the  left as well as in the dorsal aspect of foot -Improvement in first and second digit range of motion following  Manual therapy: ankle DF stretch  X 30 sec    - stretch felt in area of gastrocnemius muscle   Ankle 4-way 20x  Red Thera band (RTB) (NON LATEX) PT held for resistance  - instructed in how to hold for home utilization as well as to keep heel propped.   Ankle ABCs 1 x A-Z YTB (NON LATEX)  Added YTB for increased challenge / resistance   Seated hor ABD of bilateral hips 2 x 10  GTB (NON LATEX)  -significant trigger points and tender areas to palpation noted                       Pt instructed to wear ASO brace for 1 hour in AM and 1 hour in PM for this week and increase as tolerated next week to 2 hours.                          PT Education - 08/27/21 0903     Education Details 2 exercises added to HEP : gastroc stretch in seated with belt and levator scapulae stertch    Person(s) Educated Patient;Spouse    Methods Explanation;Demonstration;Tactile cues;Verbal cues    Comprehension Verbalized understanding;Returned demonstration;Verbal cues required;Tactile cues required;Need further instruction              PT Short Term Goals - 08/16/21 1002       PT SHORT TERM GOAL #1   Title Patient will be independent in home exercise program to improve strength/mobility for better functional independence with ADLs.    Baseline 10/26: HEP given, Pt is independent and  completes HEP regularly    Time 4    Period Weeks    Status Achieved    Target Date 08/08/21      PT SHORT TERM GOAL #2   Title Patient will report a worse VAS of 4/10 in LLE for improved tolerance of mobility and quality of life.    Baseline 4 of 10 in seated position but patient reports pain is up to 6-7 out of 10 at times.    Time 4    Period Weeks    Status On-going    Target Date 09/13/21               PT Long Term Goals - 08/16/21 1003       PT LONG TERM GOAL #1   Title Patient will increase FOTO score to equal to or greater than 47%    to demonstrate statistically significant improvement in mobility and quality of life.    Baseline 10/26: 5%, 12/1:26.38%    Time 8    Period Weeks    Status New    Target Date 09/27/21      PT LONG TERM GOAL #2   Title Patient will increase 10 meter walk test to >1.94ms as to improve gait speed for better community ambulation and to reduce fall risk.    Baseline 10/26: unable to ambulate at this time 12/1:.448m with RW,.4038mwith cane    Time 8    Period Weeks    Status Partially Met    Target Date 09/27/21      PT LONG TERM GOAL #3   Title Patient will increase BLE gross strength to 4+/5 as to improve functional strength for independent gait,  increased standing tolerance and increased ADL ability.    Baseline 10/26: see note, 12/1: Improved knee and hip strength to 4+/5 in seated position, ankle DF, EV and Inv still require strengthenigng, see flowsheets for more info    Time 8    Period Weeks    Status Partially Met      PT LONG TERM GOAL #4   Title Patient will tolerate weightbearing for >30 minutes for increased mobility and ambulation to increase indepenence with ADLs and iADLs    Baseline 10/26; able to tolerate approximately 15 minutes    Time 8    Period Weeks    Status Partially Met      PT LONG TERM GOAL #5   Title Patient will increase ankle ROM to 10 degrees within normal range for improved gait mechanics and  reduced pain.    Baseline 10/26: see note, improved will all ROM since initial eval, see note for specific details    Time 8    Period Weeks    Status On-going      PT LONG TERM GOAL #6   Title Pt will decrease worst low back pain as reported on NPRS by at least 2 points in order to demonstrate clinically significant reduction in back pain.    Baseline 08/08/2021= LBP at worst = 7/10    Time 4    Period Weeks    Status New                   Plan - 08/27/21 0909     Clinical Impression Statement Patient continues to present with excellent motivation during physical therapy session.  Patient was instructed in ASO use and an scheduling time to wear it to prevent excessive pain and inflammation.  Patient was provided with levator scapula stretch to improve upper back and neck pain and tightness.  Patient continues to make progress with ambulation and showed smooth and controlled gait pattern with 1 upper extremity assistance with cam walker boot on today.  Patient plans to bring ASO brace next session and will be assessed of walking and this is appropriate at that time.  Patient provided with further instruction for home exercise program and handout was provided and all questions  answered in session.  Patient continue to benefit from skilled physical therapy intervention in order to improve her lower extremity strength, ambulatory capacity, pain, and improve her overall quality of life.    Personal Factors and Comorbidities Age;Comorbidity 3+;Past/Current Experience;Sex;Time since onset of injury/illness/exacerbation;Transportation    Comorbidities HLD, hypothyroidism, ostepenia, osteoporis, SVT, clotting disorder    Examination-Activity Limitations Bathing;Bed Mobility;Bend;Caring for [employer];Locomotion Level;Lift;Hygiene/Grooming;Squat;Stairs;Stand;Toileting;Transfers    Examination-Participation Restrictions Church;Cleaning;Community  Activity;Driving;Interpersonal Relationship;Personal Finances;Meal Prep;Laundry;Shop;Volunteer;Yard Work;Occupation    Stability/Clinical Decision Making Evolving/Moderate complexity    Rehab Potential Fair    PT Frequency 2x / week    PT Duration 8 weeks    PT Treatment/Interventions ADLs/Self Care Home Management;Aquatic Therapy;Biofeedback;Cryotherapy;Electrical Stimulation;Iontophoresis 4mg /ml Dexamethasone;Moist Heat;Ultrasound;DME Instruction;Gait training;Stair training;Functional mobility training;Neuromuscular re-education;Cognitive remediation;Balance training;Therapeutic exercise;Therapeutic activities;Patient/family education;Orthotic Fit/Training;Manual techniques;Compression bandaging;Manual lymph drainage;Scar mobilization;Passive range of motion;Energy conservation;Splinting;Taping;Vasopneumatic Device;Visual/perceptual remediation/compensation    PT Next Visit Plan WB activities, Gait and balance training as appropriate; Progressive LE strength and ROM activities. Review and progress Low back/knee gentle stretching, looking into IT band and quad muscle for improvement in knee pain and function.    PT Home Exercise Plan encouraged weight shifting without UE assistance as part of HEP. 12/7/202- Issued non-latex GTB for hip abd and seated hamstring  activity    Consulted and Agree with Plan of Care Patient             Patient will benefit from skilled therapeutic intervention in order to improve the following deficits and impairments:  Abnormal gait, Cardiopulmonary status limiting activity, Decreased activity tolerance, Decreased balance, Decreased knowledge of precautions, Decreased endurance, Decreased knowledge of use of DME, Decreased range of motion, Decreased mobility, Difficulty walking, Decreased strength, Hypomobility, Increased edema, Impaired flexibility, Impaired perceived functional ability, Increased muscle spasms, Improper body mechanics, Pain  Visit  Diagnosis: Abnormality of gait and mobility  Difficulty in walking, not elsewhere classified  Chronic bilateral low back pain without sciatica  Pain in left leg  Pain in left lower leg     Problem List Patient Active Problem List   Diagnosis Date Noted   NSTEMI (non-ST elevated myocardial infarction) ([place]) 07/14/2021   Pulmonary nodule less than 6 mm determined by computed tomography of lung 07/14/2021   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 tract infection 04/19/2021   Hepatic hemangioma 06/02/2020   Constipation 06/02/2020   Left ankle sprain 03/25/2018   Foot injury, left, initial encounter 03/25/2018   Osteoporosis of lumbar spine 02/19/2018   Subcutaneous nodule 09/16/2017   Pain and swelling of lower leg 09/04/2017   Nevus 09/04/2017   Pain of both hip joints 06/03/2017   Need for hepatitis C screening test 05/22/2017   SVT s/p ablation 01/09/2010    Particia Lather, PT 08/27/2021, 10:18 AM  [place] [address] [place], [place], [address] Phone: [phone]   Fax:  [phone]  Name: LANESHA AZZARO MRN: [id number] Date of Birth: November 19, 1960

## 2021-08-28 ENCOUNTER — Ambulatory Visit: Payer: Managed Care, Other (non HMO) | Admitting: Physician Assistant

## 2021-08-29 ENCOUNTER — Ambulatory Visit: Payer: Managed Care, Other (non HMO)

## 2021-08-29 ENCOUNTER — Other Ambulatory Visit: Payer: Self-pay

## 2021-08-29 DIAGNOSIS — R262 Difficulty in walking, not elsewhere classified: Secondary | ICD-10-CM

## 2021-08-29 DIAGNOSIS — R269 Unspecified abnormalities of gait and mobility: Secondary | ICD-10-CM

## 2021-08-29 DIAGNOSIS — M6281 Muscle weakness (generalized): Secondary | ICD-10-CM

## 2021-08-29 NOTE — Therapy (Signed)
[facility] SERVICES [address] [place], [place], [address] Phone: [phone]   Fax:  [phone]  Physical Therapy Treatment  Patient Details  Name: Michelle Armstrong MRN: [id number] Date of Birth: Jun 15, 1961 Referring Provider (PT): Hildred Alamin PA   Encounter Date: 08/29/2021   PT End of Session - 08/29/21 0917     Visit Number 14    Number of Visits 16    Date for PT Re-Evaluation 09/05/21    Authorization Type Initial Eval 07/11/2021    Progress Note Due on Visit 20    PT Start Time 0802    PT Stop Time 0846    PT Time Calculation (min) 44 min    Equipment Utilized During Treatment Other (comment);Gait belt   SBQC, SPC   Activity Tolerance Patient tolerated treatment well    Behavior During Therapy [facility] for tasks assessed/performed             Past Medical History:  Diagnosis Date   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 02/2010 Echo: EF 55-60%, no rwma.   Vitamin D deficiency    low    Past Surgical History:  Procedure Laterality Date   ABDOMINAL HYSTERECTOMY  1999   Total lap hyst BSO due to endometriosis   CARDIAC ELECTROPHYSIOLOGY MAPPING AND ABLATION  2011   COLONOSCOPY WITH PROPOFOL N/A 07/10/2015   Procedure: COLONOSCOPY WITH PROPOFOL;  Surgeon: Lollie Sails, MD;  Location: [facility] ENDOSCOPY;  Service: Endoscopy;  Laterality: N/A;   GALLBLADDER SURGERY     HERNIA REPAIR  12/2010   LEFT HEART CATH AND CORONARY ANGIOGRAPHY  N/A 02/24/2017   Procedure: Left Heart Cath and Coronary Angiography;  Surgeon: Wellington Hampshire, MD;  Location: [place] CV LAB;  Service: Cardiovascular;  Laterality: N/A;   TONSILLECTOMY      There were no vitals filed for this visit.   Subjective Assessment - 08/29/21 0908     Subjective Patient reports doing better and using her ASO brace (ramping up wear time). She reports as she progresses that her pain changes locations some. She continues to endorse Left ankle/heel/knee/bilateral hips, and back/neck pain. She continues to report compliance with all instructed exercises and using cane more at home.    Patient is accompained by: Family member    Pertinent History 60 yo Female s/p fall with subsequent closed non-displaced segmental fracture of left fibula on 05/17/21. Pt reports she sustained her initial injury when she was leaving a resturaunt. She reprots she was stepping onto an outdoor carpeted surface which had a unmarked, large indentation beneath the carpet which could not be noticed. When she stepped into this area, her ankle turned and resulted in the fracture and fall. She was casted for 6 weeks and has now been given a walking boot which she will wear for 6 weeks. MD order states, May gradually  advance weight bearing as tolerated in the boot. Once full weightbearing, may wean out of boot She also suffered an L1 compression fracture (25% loss of height). She reports orthopedic MD has not evaluated her ankle. She has a referral to [facility] to evaluate ankle later today. 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 As far as she is aware she has not had a clot at this point. She also has osteopenia. Pt is unable to don/doff boot independently due to difficulty bending over, husband helps her; Pt reports increased back pain with difficulty getting in/out of bed and has since been sleeping and living in a lift chair. PMH significant:  Clotting disorder, HLD, hypothyroidism, Osteopenia, SVT, vitamin D deficiency    Limitations Sitting;Lifting;Standing;Walking;House hold activities    How long can you stand comfortably? very limited secondary to pain    How long can you walk comfortably? Very limited  but imroving with walking at home using a front wheeled walker and walking boot on left side    Diagnostic tests 10/2: MRI of lumbar spine: Evolving subacute compression fracture involving the superior  endplate of L1 with mild 25% height loss and trace 2 mm bony  retropulsion. No associated stenosis.  2. Small left foraminal disc protrusion with annular fissure at  L4-5, closely approximating and potentially irritating the exiting  left L4 nerve root.  3. Mild-to-moderate facet hypertrophy at L3-4 through L5-S1. X-ray of LLE: 1.  Mildly displaced oblique extra-articular fracture of the proximal left fibular diaphysis.   2.  Nondisplaced left lateral malleolar fracture.   3.  There is no significant osteoarthrosis.    Patient Stated Goals Pt wants to be able to walk normally again without pain and with normalized gait pattern and return to previous level of function.    Currently in Pain? Yes   Left ankle/knee/heel/bilateral hips, Back and neck   Pain Descriptors / Indicators Aching;Burning;Discomfort;Tingling;Sore;Sharp    Pain Type Chronic pain    Pain Onset More than a month ago    Pain Frequency Constant    Aggravating Factors  weightbearing, walking    Pain Relieving Factors Rest    Effect of Pain on Daily Activities Difficulty walking for prolonged periods of time. Difficulty with ADL's    Pain Onset More than a month ago             Interventions:   Manual Therapy:   Grade I-II talocrual joint mobs (A/P and P/A) x 30 bouts x mult sets. Patient reported some discomfort in buttock region due to position but denied any significant left ankle pain during mobs.  A/P joint mobs (P/A and A/P) to metatarsals x 15 bouts x mult  sets.     Therapeutic Exercises:  *Patient instructed to don ASO brace (assisted by Husband)  Patient ambulated in clinic with left ASO in tennis shoe and use of cane, Supervision. Patient presented with good reciprocal gait- favoring left side yet no loss of balance. She did endorse increase back, knee, and ankle soreness. Total distance =175 feet. *Adjusted height of cane - Lifted by 1 notch  ROCKER BOARD activities- -initially 50/50 weight bearing with board in lateral weight bearing position. VC for correct technique and instruction to perform without UE support. - Patient was very quick to attain 50/50 and did report some pain but able to accomplish and hold > 20 sec well.  -She progressed to lateral weight shift without UE  support and did report some pain throughout left ankle/hips/back but able to weight shift 10-15 times.  - Rocker positioned ant/post and patient resumed performing 50/50 position- again able to perform well and attain equal weight bearing. -Rocker- with ant/post weight shift and no UE support-x 10-15 reps each direction.   Standing hip flex/abd (up and over) orange hurdle with BUE Support (using railing of // bars) - x 10-12 reps each leg- Initial VC to take a wider step- patient responded well to cues and able to widen her step and complete.   *Discussed other hip strengthening options such as standing hip abd or side stepping (with or without non-latex resistive bands). Patient verbalized understanding of these options for hip strengthening.  *Patient questioned if she should be doing anything else for her back issues so PT verbally reviewed several Low back/hip stretching including: Knee to chest, Hamstring, Lower trunk rotation,  and instructed in and patient performed the following: -seated Fig. 4 stretch -Seated lumbar stretch (Instructed to hold each stretch for 20-30 sec)     Education provided throughout session via VC/TC and demonstration to facilitate  movement at target joints and correct muscle activation for all testing and exercises performed.      Patient continues to perform well in clinic- highly motivated to improve her functional mobility. She was able to progress with weight bearing/shifting activities well today without unbearable pain. She is walking appropriately for her level of pain reported using ASO and cane today. Reviewed stretching activities for Low back and patient presents with good working knowledge of all stretches and strengthening.  Patient will continue to benefit from skilled physical therapy intervention in order to improve her lower extremity strength, ambulatory capacity, pain, and improve her overall quality of life.                    PT Education - 08/29/21 0916     Education Details Exercise technique.    Person(s) Educated Patient;Spouse    Methods Explanation;Demonstration;Tactile cues;Verbal cues    Comprehension Verbalized understanding;Returned demonstration;Verbal cues required;Need further instruction;Tactile cues required              PT Short Term Goals - 08/16/21 1002       PT SHORT TERM GOAL #1   Title Patient will be independent in home exercise program to improve strength/mobility for better functional independence with ADLs.    Baseline 10/26: HEP given, Pt is independent and completes HEP regularly    Time 4    Period Weeks    Status Achieved    Target Date 08/08/21      PT SHORT TERM GOAL #2   Title Patient will report a worse VAS of 4/10 in LLE for improved tolerance of mobility and quality of life.    Baseline 4 of 10 in seated position but patient reports pain is up to 6-7 out of 10 at times.    Time 4    Period Weeks    Status On-going    Target Date 09/13/21               PT Long Term Goals - 08/16/21 1003       PT LONG TERM GOAL #1   Title Patient will increase FOTO score to equal to or greater than 47%    to demonstrate statistically  significant improvement in mobility and quality of life.    Baseline 10/26: 5%, 12/1:26.38%    Time 8    Period  Weeks    Status New    Target Date 09/27/21      PT LONG TERM GOAL #2   Title Patient will increase 10 meter walk test to >1.62ms as to improve gait speed for better community ambulation and to reduce fall risk.    Baseline 10/26: unable to ambulate at this time 12/1:.422m with RW,.4024mwith cane    Time 8    Period Weeks    Status Partially Met    Target Date 09/27/21      PT LONG TERM GOAL #3   Title Patient will increase BLE gross strength to 4+/5 as to improve functional strength for independent gait, increased standing tolerance and increased ADL ability.    Baseline 10/26: see note, 12/1: Improved knee and hip strength to 4+/5 in seated position, ankle DF, EV and Inv still require strengthenigng, see flowsheets for more info    Time 8    Period Weeks    Status Partially Met      PT LONG TERM GOAL #4   Title Patient will tolerate weightbearing for >30 minutes for increased mobility and ambulation to increase indepenence with ADLs and iADLs    Baseline 10/26; able to tolerate approximately 15 minutes    Time 8    Period Weeks    Status Partially Met      PT LONG TERM GOAL #5   Title Patient will increase ankle ROM to 10 degrees within normal range for improved gait mechanics and reduced pain.    Baseline 10/26: see note, improved will all ROM since initial eval, see note for specific details    Time 8    Period Weeks    Status On-going      PT LONG TERM GOAL #6   Title Pt will decrease worst low back pain as reported on NPRS by at least 2 points in order to demonstrate clinically significant reduction in back pain.    Baseline 08/08/2021= LBP at worst = 7/10    Time 4    Period Weeks    Status New                   Plan - 08/29/21 0917     Clinical Impression Statement Patient continues to perform well in clinic- highly motivated to improve her  functional mobility. She was able to progress with weight bearing/shifting activities well today without unbearable pain. She is walking appropriately for her level of pain reported using ASO and cane today. Reviewed stretching activities for Low back and patient presents with good working knowledge of all stretches and strengthening.  Patient will continue to benefit from skilled physical therapy intervention in order to improve her lower extremity strength, ambulatory capacity, pain, and improve her overall quality of life.    Personal Factors and Comorbidities Age;Comorbidity 3+;Past/Current Experience;Sex;Time since onset of injury/illness/exacerbation;Transportation    Comorbidities HLD, hypothyroidism, ostepenia, osteoporis, SVT, clotting disorder    Examination-Activity Limitations Bathing;Bed Mobility;Bend;Caring for [employer]otion Level;Lift;Hygiene/Grooming;Squat;Stairs;Stand;Toileting;Transfers    Examination-Participation Restrictions Church;Cleaning;Community Activity;Driving;Interpersonal Relationship;Personal Finances;Meal Prep;Laundry;Shop;Volunteer;Yard Work;Occupation    Stability/Clinical Decision Making Evolving/Moderate complexity    Rehab Potential Fair    PT Frequency 2x / week    PT Duration 8 weeks    PT Treatment/Interventions ADLs/Self Care Home Management;Aquatic Therapy;Biofeedback;Cryotherapy;Electrical Stimulation;Iontophoresis 4mg32m Dexamethasone;Moist Heat;Ultrasound;DME Instruction;Gait training;Stair training;Functional mobility training;Neuromuscular re-education;Cognitive remediation;Balance training;Therapeutic exercise;Therapeutic activities;Patient/family education;Orthotic Fit/Training;Manual techniques;Compression bandaging;Manual lymph drainage;Scar mobilization;Passive range of motion;Energy conservation;Splinting;Taping;Vasopneumatic Device;Visual/perceptual remediation/compensation    PT Next Visit Plan WB activities,  Gait and balance training as appropriate; Progressive LE strength and ROM activities. Review and progress Low back/knee gentle stretching, looking into IT band and quad muscle for improvement in knee pain and function.    PT Home Exercise Plan encouraged weight shifting without UE assistance as part of HEP. 12/7/202- Issued non-latex GTB for hip abd and seated hamstring activity    Consulted and Agree with Plan of Care Patient             Patient will benefit from skilled therapeutic intervention in order to improve the following deficits and impairments:  Abnormal gait, Cardiopulmonary status limiting activity, Decreased activity tolerance, Decreased balance, Decreased knowledge of precautions, Decreased endurance, Decreased knowledge of use of DME, Decreased range of motion, Decreased mobility, Difficulty walking, Decreased strength, Hypomobility, Increased edema, Impaired flexibility, Impaired perceived functional ability, Increased muscle spasms, Improper body mechanics, Pain  Visit Diagnosis: Abnormality of gait and mobility  Difficulty in walking, not elsewhere classified  Muscle weakness (generalized)     Problem List Patient Active Problem List   Diagnosis Date Noted   NSTEMI (non-ST elevated myocardial infarction) ([place]) 07/14/2021   Pulmonary nodule less than 6 mm determined by computed tomography of lung 07/14/2021   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 tract infection 04/19/2021   Hepatic hemangioma 06/02/2020   Constipation 06/02/2020   Left ankle sprain 03/25/2018   Foot injury, left, initial encounter 03/25/2018   Osteoporosis of lumbar spine 02/19/2018   Subcutaneous nodule 09/16/2017   Pain and swelling of lower leg 09/04/2017   [place] 09/04/2017   Pain of both hip joints  06/03/2017   Need for hepatitis C screening test 05/22/2017   SVT s/p ablation 01/09/2010    Lewis Moccasin, PT 08/29/2021, 10:16 AM  [facility] [address] [place], [place], [address] Phone: [phone]   Fax:  [phone]  Name: Michelle Armstrong MRN: [id number] Date of Birth: February 19, 1961

## 2021-09-03 ENCOUNTER — Encounter: Payer: Self-pay | Admitting: Physical Therapy

## 2021-09-03 ENCOUNTER — Other Ambulatory Visit: Payer: Self-pay

## 2021-09-03 ENCOUNTER — Ambulatory Visit: Payer: Managed Care, Other (non HMO) | Admitting: Physical Therapy

## 2021-09-03 DIAGNOSIS — M545 Low back pain, unspecified: Secondary | ICD-10-CM

## 2021-09-03 DIAGNOSIS — R269 Unspecified abnormalities of gait and mobility: Secondary | ICD-10-CM | POA: Diagnosis not present

## 2021-09-03 DIAGNOSIS — R2681 Unsteadiness on feet: Secondary | ICD-10-CM

## 2021-09-03 DIAGNOSIS — M79605 Pain in left leg: Secondary | ICD-10-CM

## 2021-09-03 DIAGNOSIS — R262 Difficulty in walking, not elsewhere classified: Secondary | ICD-10-CM

## 2021-09-03 DIAGNOSIS — R278 Other lack of coordination: Secondary | ICD-10-CM

## 2021-09-03 DIAGNOSIS — R2689 Other abnormalities of gait and mobility: Secondary | ICD-10-CM

## 2021-09-03 DIAGNOSIS — M79662 Pain in left lower leg: Secondary | ICD-10-CM

## 2021-09-03 DIAGNOSIS — M6281 Muscle weakness (generalized): Secondary | ICD-10-CM

## 2021-09-03 NOTE — Patient Instructions (Signed)
Access Code: [email or web address] URL: [email or web address] Date: 09/03/2021 Prepared by: Blanche East  Exercises Seated Transversus Abdominis Bracing - 1 x daily - 7 x weekly - 2 sets - 5 reps - 5 sec hold hold Seated March - 1 x daily - 7 x weekly - 2 sets - 5 reps

## 2021-09-03 NOTE — Therapy (Signed)
[place] MAIN [facility] SERVICES [address] [place], [place], [address] Phone: [phone]   Fax:  [phone]  Physical Therapy Treatment  Patient Details  Name: Michelle Armstrong MRN: [id number] Date of Birth: 05-Feb-1961 Referring Provider (PT): Hildred Alamin PA   Encounter Date: 09/03/2021   PT End of Session - 09/03/21 1519     Visit Number 15    Number of Visits 16    Date for PT Re-Evaluation 09/05/21    Authorization Type Initial Eval 07/11/2021    Progress Note Due on Visit 20    PT Start Time 0932    PT Stop Time 1015    PT Time Calculation (min) 43 min    Equipment Utilized During Treatment Other (comment);Gait belt   SPC/ankle brace   Activity Tolerance Patient tolerated treatment well    Behavior During Therapy [facility] for tasks assessed/performed             Past Medical History:  Diagnosis Date   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 02/2010 Echo: EF 55-60%, no rwma.   Vitamin D deficiency    low    Past Surgical History:  Procedure Laterality Date   ABDOMINAL HYSTERECTOMY  1999   Total lap hyst BSO due to endometriosis   CARDIAC ELECTROPHYSIOLOGY MAPPING AND ABLATION  2011   COLONOSCOPY WITH PROPOFOL N/A 07/10/2015   Procedure: COLONOSCOPY WITH PROPOFOL;  Surgeon: Lollie Sails, MD;  Location[facility] END[facility]COPY;  Service: Endoscopy;  Laterality: N/A;   GALLBLADDER SURGERY     HERNIA REPAIR  12/2010   LEFT HEART CATH AND CORONARY  ANGIOGRAPHY N/A 02/24/2017   Procedure: Left Heart Cath and Coronary Angiography;  Surgeon: Wellington Hampshire, MD;  Location: [place] CV LAB;  Service: Cardiovascular;  Laterality: N/A;   TONSILLECTOMY      There were no vitals filed for this visit.   Subjective Assessment - 09/03/21 0935     Subjective Patient reports doing better and using her ASO brace (ramping up wear time). She is still having pain in back and ankle;    Patient is accompained by: Family member    Pertinent History 60 yo Female s/p fall with subsequent closed non-displaced segmental fracture of left fibula on 05/17/21. Pt reports she sustained her initial injury when she was leaving a resturaunt. She reprots she was stepping onto an outdoor carpeted surface which had a unmarked, large indentation beneath the carpet which could not be noticed. When she stepped into this area, her ankle turned and resulted in the fracture and fall. She was casted for 6 weeks and has now been given a walking boot which she will wear for 6 weeks. MD order states, May gradually advance weight bearing as tolerated in the boot. Once full weightbearing, may wean out of boot She also suffered an L1 compression fracture (25% loss of height).  She reports orthopedic MD has not evaluated her ankle. She has a referral to [facility] to evaluate ankle later today. 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 As far as she is aware she has not had a clot at this point. She also has osteopenia. Pt is unable to don/doff boot independently due to difficulty bending over, husband helps her; Pt reports increased back pain with difficulty getting in/out of bed and has since been sleeping and living in a lift chair. PMH significant: Clotting disorder, HLD, hypothyroidism, Osteopenia, SVT, vitamin D deficiency    Limitations Sitting;Lifting;Standing;Walking;House hold activities    How long can you stand comfortably?  very limited secondary to pain    How long can you walk comfortably? Very limited  but imroving with walking at home using a front wheeled walker and walking boot on left side    Diagnostic tests 10/2: MRI of lumbar spine: Evolving subacute compression fracture involving the superior  endplate of L1 with mild 25% height loss and trace 2 mm bony  retropulsion. No associated stenosis.  2. Small left foraminal disc protrusion with annular fissure at  L4-5, closely approximating and potentially irritating the exiting  left L4 nerve root.  3. Mild-to-moderate facet hypertrophy at L3-4 through L5-S1. X-ray of LLE: 1.  Mildly displaced oblique extra-articular fracture of the proximal left fibular diaphysis.   2.  Nondisplaced left lateral malleolar fracture.   3.  There is no significant osteoarthrosis.    Patient Stated Goals Pt wants to be able to walk normally again without pain and with normalized gait pattern and return to previous level of function.    Currently in Pain? Yes    Pain Score 4     Pain Location Ankle    Pain Orientation Left    Pain Descriptors / Indicators Aching    Pain Type Chronic pain    Pain Onset More than a month ago    Pain Frequency Constant    Aggravating Factors  weight bearing/walking    Pain Relieving Factors rest    Effect of Pain on Daily Activities difficulty walking/decreased activity tolerance;    Multiple Pain Sites Yes    Pain Score 5    Pain Location Back    Pain Orientation Right;Left;Upper;Mid;Lower    Pain Descriptors / Indicators Aching;Sore    Pain Type Chronic pain    Pain Onset More than a month ago    Pain Frequency Intermittent    Aggravating Factors  worse with sitting/lying certain positions    Pain Relieving Factors rest/heat    Effect of Pain on Daily Activities decreased activity tolerance;    Pain Score 5    Pain Location Knee    Pain Orientation Left;Lateral    Pain Descriptors / Indicators Aching;Sore    Pain Type Chronic pain    Pain  Onset More than a month ago    Pain Frequency Constant    Aggravating Factors  walking/ elevating or support    Pain Relieving Factors some movement; ice/heat    Effect of Pain on Daily Activities decreased activity tolerance;                [facility] PT Assessment - 09/03/21 0001       AROM   Left Ankle Dorsiflexion 12    Left Ankle Plantar Flexion 65    Left Ankle Inversion 45    Left Ankle Eversion 30  TREATMENT: Patient transferred to mat table, long sitting: PT performed passive ROM to LLE ankle in DF/PF 20 sec hold with slight overpressure for stretch x2 reps each PT performed grade II-III AP/PA mobs to talocrual joint, LLE 20 sec bouts x3 sets each to facilitate better ankle ROM PT performed grade II-III AP mobs to 2-3 DIP/PIP and MTP joints to facilitate better toe mobility and reduce stiffness Patient does report moderate tenderness to palpation but was able to tolerate joint mobilization well with good mobility noted PT assessed ankle ROM, see above; While her ankle ROM is functional to normal, it is less than RLE;  Patient ambulated 140 feet with SPC, wearing LLE ASO brace, demonstrating antalgic gait pattern, decreased stance time on LLE and short step lengthRLE; she was able to exhibit reciprocal gait pattern but does exhibit heavy lean on cane in RUE;  Patient reports increased back pain and has still been sleeping in lift chair, being unable to tolerate supine positioning Instructed patient in seated core stabilization: Posterior pelvic tilt 5 sec hold x5 reps Hip flexion march x5 reps each LE with cues for breath support and abdominal stabilization, exhale when lift for better core stabilization to help reduce back discomfort Advanced HEP- see patient instructions  Standing in parallel bars: Standing on rockerboard; -side/side weight shift unsupported x10 reps  Progressed to feet apart, BLE heel raises with fingertip hold x10 reps (moderate  discomfort reported) -forward/backward weight shift x10 reps Progressed to staggered stance, forward/backward weight shift x10 reps each foot in front to challenge weight acceptance to LLE Required close supervision and min VCs for proper positioning;   Patient tolerated session well. She is motivated to progress with improved strength and mobility;                      PT Education - 09/03/21 1519     Education Details exercise technique/HEP    Person(s) Educated Patient    Methods Explanation;Verbal cues    Comprehension Verbalized understanding;Verbal cues required;Returned demonstration;Need further instruction              PT Short Term Goals - 08/16/21 1002       PT SHORT TERM GOAL #1   Title Patient will be independent in home exercise program to improve strength/mobility for better functional independence with ADLs.    Baseline 10/26: HEP given, Pt is independent and completes HEP regularly    Time 4    Period Weeks    Status Achieved    Target Date 08/08/21      PT SHORT TERM GOAL #2   Title Patient will report a worse VAS of 4/10 in LLE for improved tolerance of mobility and quality of life.    Baseline 4 of 10 in seated position but patient reports pain is up to 6-7 out of 10 at times.    Time 4    Period Weeks    Status On-going    Target Date 09/13/21               PT Long Term Goals - 08/16/21 1003       PT LONG TERM GOAL #1   Title Patient will increase FOTO score to equal to or greater than 47%    to demonstrate statistically significant improvement in mobility and quality of life.    Baseline 10/26: 5%, 12/1:26.38%    Time 8    Period Weeks    Status New    Target  Date 09/27/21      PT LONG TERM GOAL #2   Title Patient will increase 10 meter walk test to >1.63ms as to improve gait speed for better community ambulation and to reduce fall risk.    Baseline 10/26: unable to ambulate at this time 12/1:.444m with RW,.4046m with cane    Time 8    Period Weeks    Status Partially Met    Target Date 09/27/21      PT LONG TERM GOAL #3   Title Patient will increase BLE gross strength to 4+/5 as to improve functional strength for independent gait, increased standing tolerance and increased ADL ability.    Baseline 10/26: see note, 12/1: Improved knee and hip strength to 4+/5 in seated position, ankle DF, EV and Inv still require strengthenigng, see flowsheets for more info    Time 8    Period Weeks    Status Partially Met      PT LONG TERM GOAL #4   Title Patient will tolerate weightbearing for >30 minutes for increased mobility and ambulation to increase indepenence with ADLs and iADLs    Baseline 10/26; able to tolerate approximately 15 minutes    Time 8    Period Weeks    Status Partially Met      PT LONG TERM GOAL #5   Title Patient will increase ankle ROM to 10 degrees within normal range for improved gait mechanics and reduced pain.    Baseline 10/26: see note, improved will all ROM since initial eval, see note for specific details    Time 8    Period Weeks    Status On-going      PT LONG TERM GOAL #6   Title Pt will decrease worst low back pain as reported on NPRS by at least 2 points in order to demonstrate clinically significant reduction in back pain.    Baseline 08/08/2021= LBP at worst = 7/10    Time 4    Period Weeks    Status New                   Plan - 09/03/21 1520     Clinical Impression Statement Patient motivated and participated well within session. She continues to have some stiffness in ankle/distal toes which was alleviated with joint mobilization/stretches. Patient does have pain/tenderness to palpation but was able to tolerate manual therapy well. Patient is progressing well with ankle ROM. Patient is able to ambulate with reciprocal gait pattern but does exhibit antalgic gait with short stance time on LLE due to pain with weight bearing. She was instructed in core  stabilization to help improve trunk support and reduce back pain. Patient required min-mod VCs for proper exercise technique. Advanced HEP, see patient instructions. She would benefit from additional skilled PT Intervention to improve strength and mobility while reducing pain; Will address goals next session;    Personal Factors and Comorbidities Age;Comorbidity 3+;Past/Current Experience;Sex;Time since onset of injury/illness/exacerbation;Transportation    Comorbidities HLD, hypothyroidism, ostepenia, osteoporis, SVT, clotting disorder    Examination-Activity Limitations Bathing;Bed Mobility;Bend;Caring for [employer]otion Level;Lift;Hygiene/Grooming;Squat;Stairs;Stand;Toileting;Transfers    Examination-Participation Restrictions Church;Cleaning;Community Activity;Driving;Interpersonal Relationship;Personal Finances;Meal Prep;Laundry;Shop;Volunteer;Yard Work;Occupation    Stability/Clinical Decision Making Evolving/Moderate complexity    Rehab Potential Fair    PT Frequency 2x / week    PT Duration 8 weeks    PT Treatment/Interventions ADLs/Self Care Home Management;Aquatic Therapy;Biofeedback;Cryotherapy;Electrical Stimulation;Iontophoresis 4mg46m Dexamethasone;Moist Heat;Ultrasound;DME Instruction;Gait training;Stair training;Functional mobility training;Neuromuscular re-education;Cognitive remediation;Balance training;Therapeutic exercise;Therapeutic activities;Patient/family education;Orthotic Fit/Training;Manual techniques;Compression bandaging;Manual  lymph drainage;Scar mobilization;Passive range of motion;Energy conservation;Splinting;Taping;Vasopneumatic Device;Visual/perceptual remediation/compensation    PT Next Visit Plan WB activities, Gait and balance training as appropriate; Progressive LE strength and ROM activities. Review and progress Low back/knee gentle stretching, looking into IT band and quad muscle for improvement in knee pain and function.     PT Home Exercise Plan encouraged weight shifting without UE assistance as part of HEP. 12/7/202- Issued non-latex GTB for hip abd and seated hamstring activity    Consulted and Agree with Plan of Care Patient             Patient will benefit from skilled therapeutic intervention in order to improve the following deficits and impairments:  Abnormal gait, Cardiopulmonary status limiting activity, Decreased activity tolerance, Decreased balance, Decreased knowledge of precautions, Decreased endurance, Decreased knowledge of use of DME, Decreased range of motion, Decreased mobility, Difficulty walking, Decreased strength, Hypomobility, Increased edema, Impaired flexibility, Impaired perceived functional ability, Increased muscle spasms, Improper body mechanics, Pain  Visit Diagnosis: Abnormality of gait and mobility  Difficulty in walking, not elsewhere classified  Muscle weakness (generalized)  Chronic bilateral low back pain without sciatica  Pain in left leg  Pain in left lower leg  Unsteadiness on feet  Other lack of coordination  Other abnormalities of gait and mobility     Problem List Patient Active Problem List   Diagnosis Date Noted   NSTEMI (non-ST elevated myocardial infarction) ([place]) 07/14/2021   Pulmonary nodule less than 6 mm determined by computed tomography of lung 07/14/2021   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 tract infection 04/19/2021   Hepatic hemangioma 06/02/2020   Constipation 06/02/2020   Left ankle sprain 03/25/2018   Foot injury, left, initial encounter 03/25/2018   Osteoporosis of lumbar spine 02/19/2018   Subcutaneous nodule 09/16/2017   Pain and swelling of lower leg 09/04/2017   Nevus 09/04/2017   Pain of both hip joints 06/03/2017   Need for  hepatitis C screening test 05/22/2017   SVT s/p ablation 01/09/2010    Brandi Armato, PT, DPT 09/03/2021, 3:27 PM  [place] [facility] [address] [place], [place], [address] Phone: [phone]   Fax:  [phone]  Name: Michelle Armstrong MRN: [id number] Date of Birth: 1961-08-24

## 2021-09-04 ENCOUNTER — Ambulatory Visit: Payer: Managed Care, Other (non HMO) | Admitting: Physical Therapy

## 2021-09-05 ENCOUNTER — Ambulatory Visit: Payer: Managed Care, Other (non HMO)

## 2021-09-05 ENCOUNTER — Other Ambulatory Visit: Payer: Self-pay

## 2021-09-05 DIAGNOSIS — R269 Unspecified abnormalities of gait and mobility: Secondary | ICD-10-CM | POA: Diagnosis not present

## 2021-09-05 DIAGNOSIS — M6281 Muscle weakness (generalized): Secondary | ICD-10-CM

## 2021-09-05 DIAGNOSIS — R2681 Unsteadiness on feet: Secondary | ICD-10-CM

## 2021-09-05 DIAGNOSIS — R262 Difficulty in walking, not elsewhere classified: Secondary | ICD-10-CM

## 2021-09-05 DIAGNOSIS — M545 Low back pain, unspecified: Secondary | ICD-10-CM

## 2021-09-05 NOTE — Therapy (Signed)
[place] MAIN [facility] SERVICES [address] [place], [place], [address] Phone: [phone]   Fax:  [phone]  Physical Therapy Treatment/Recertification dates 35/57/3220-2/54/2706  Patient Details  Name: Michelle Armstrong MRN: [id number] Date of Birth: 12-Jan-1961 Referring Provider (PT): Hildred Alamin PA   Encounter Date: 09/05/2021   PT End of Session - 09/05/21 0756     Visit Number 16    Number of Visits 32    Date for PT Re-Evaluation 10/31/21    Authorization Type Initial Eval 17/61/6073; Recert 71/02/2693-8/54/6270    Progress Note Due on Visit 20    PT Start Time 0802    PT Stop Time 0846    PT Time Calculation (min) 44 min    Equipment Utilized During Treatment Other (comment);Gait belt   SPC/ankle brace   Activity Tolerance Patient tolerated treatment well    Behavior During Therapy [facility] for tasks assessed/performed             Past Medical History:  Diagnosis Date   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 02/2010 Echo: EF 55-60%, no rwma.   Vitamin D deficiency    low    Past Surgical History:  Procedure Laterality Date   ABDOMINAL HYSTERECTOMY  1999   Total lap hyst BSO due to endometriosis   CARDIAC ELECTROPHYSIOLOGY MAPPING AND ABLATION  2011   COLONOSCOPY WITH PROPOFOL N/A 07/10/2015   Procedure: COLONOSCOPY WITH PROPOFOL;  Surgeon: Lollie Sails, MD;  Location: [facility] END[facility]COPY;  Service: Endoscopy;  Laterality: N/A;   GALLBLADDER  SURGERY     HERNIA REPAIR  12/2010   LEFT HEART CATH AND CORONARY ANGIOGRAPHY N/A 02/24/2017   Procedure: Left Heart Cath and Coronary Angiography;  Surgeon: Wellington Hampshire, MD;  Location: [facility];  Service: Cardiovascular;  Laterality: N/A;   TONSILLECTOMY      There were no vitals filed for this visit.   Subjective Assessment - 09/05/21 0755     Subjective Patient reports wearing her ASO brace all day now- does continue to report some irritability/pain in left ankle with usage. She also continues to endorse ongoing back, hip, and knee pain. She and her husband report that she is doing pretty well with steps. She states doing the newer exercises prescribed last visit but has difficulty with breathing as instructed. She also states she will need to be able to sleep in a bed as she is visiting family over the holidays.    Patient is accompained by: Family member    Pertinent History 60 yo Female s/p fall with subsequent closed non-displaced segmental fracture of left fibula on 05/17/21. Pt reports she sustained her initial injury when she was leaving a resturaunt. She reprots she was stepping onto an outdoor carpeted surface which had a unmarked, large indentation beneath the carpet which could not be noticed. When she stepped into this  area, her ankle turned and resulted in the fracture and fall. She was casted for 6 weeks and has now been given a walking boot which she will wear for 6 weeks. MD order states, May gradually advance weight bearing as tolerated in the boot. Once full weightbearing, may wean out of boot She also suffered an L1 compression fracture (25% loss of height). She reports orthopedic MD has not evaluated her ankle. She has a referral to [facility] to evaluate ankle later today. 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 As far as she is aware she has not had a clot at this point. She also has osteopenia. Pt is  unable to don/doff boot independently due to difficulty bending over, husband helps her; Pt reports increased back pain with difficulty getting in/out of bed and has since been sleeping and living in a lift chair. PMH significant: Clotting disorder, HLD, hypothyroidism, Osteopenia, SVT, vitamin D deficiency    Limitations Sitting;Lifting;Standing;Walking;House hold activities    How long can you stand comfortably? --    How long can you walk comfortably? --    Diagnostic tests 10/2: MRI of lumbar spine: Evolving subacute compression fracture involving the superior  endplate of L1 with mild 25% height loss and trace 2 mm bony  retropulsion. No associated stenosis.  2. Small left foraminal disc protrusion with annular fissure at  L4-5, closely approximating and potentially irritating the exiting  left L4 nerve root.  3. Mild-to-moderate facet hypertrophy at L3-4 through L5-S1. X-ray of LLE: 1.  Mildly displaced oblique extra-articular fracture of the proximal left fibular diaphysis.   2.  Nondisplaced left lateral malleolar fracture.   3.  There is no significant osteoarthrosis.    Patient Stated Goals Pt wants to be able to walk normally again without pain and with normalized gait pattern and return to previous level of function.    Currently in Pain? Yes   Pain in left ankle= 5/10 today; Low back = 5/10   Pain Onset More than a month ago    Pain Onset --    Pain Onset --                [facility] PT Assessment - 09/05/21 1056       Berg Balance Test   Sit to Stand Able to stand without using hands and stabilize independently    Standing Unsupported Able to stand safely 2 minutes    Sitting with Back Unsupported but Feet Supported on Floor or Stool Able to sit safely and securely 2 minutes    Stand to Sit Sits safely with minimal use of hands    Transfers Able to transfer safely, minor use of hands    Standing Unsupported with Eyes Closed Able to stand 10 seconds safely    Standing Unsupported  with Feet Together Able to place feet together independently and stand 1 minute safely    From Standing, Reach Forward with Outstretched Arm Can reach confidently >25 cm (10")    From Standing Position, Pick up Object from Floor Able to pick up shoe, needs supervision    From Standing Position, Turn to Look Behind Over each Shoulder Looks behind from both sides and weight shifts well    Turn 360 Degrees Able to turn 360 degrees safely but slowly    Standing Unsupported, Alternately Place Feet on Step/Stool Able to stand independently and safely and complete 8 steps in 20 seconds  Standing Unsupported, One Foot in Railroad to place foot tandem independently and hold 30 seconds    Standing on One Leg Tries to lift leg/unable to hold 3 seconds but remains standing independently    Total Score 50           Interventions:   Reassessed all remaining goals for today's recert visit:   Short term goal:  -Patient reports pain level in ankle= 5/10 today- having more soreness due to increase use of ASO brace (ONGOING)   Long term goals:  -FOTO Score= 40 (improved from 5% initially)  -10 MWT= 0.6 m/s (improved from 0.74m/s measured on 08/16/2021)  (ongoing)  - MMT= 4+/5 bilateral Hip/knee/and 3+ left ankle - DF/PF/IV/EV (ongoing) - 30 min stand- patient self reported goal met - ankle ROM- (See measures from last visit) - GOAL MET - Back pain= 5/10 today (Ongoing) - Added another LE Strength goal- Pt will decrease 5TSTS by at least 3 seconds in order to demonstrate clinically significant improvement in LE strength. -Added a balance goal: Pt will improve BERG by at least 3 points in order to demonstrate clinically significant improvement in balance.      Instructed patient in bed mobility strategies- including log roll (PT demo today)   Also reviewed breathing technique with previously educated core training- Seated hip march while engaging core- Patient was able to verbalize and return demo  with correct technique                       PT Education - 09/05/21 1005     Education Details Purpose of recert visit and plan/goals; log roll technique with bed mobility; breathing technique with core exercises.    Person(s) Educated Patient    Methods Explanation;Demonstration;Tactile cues;Verbal cues    Comprehension Verbalized understanding;Returned demonstration;Verbal cues required;Need further instruction;Tactile cues required              PT Short Term Goals - 09/05/21 1009       PT SHORT TERM GOAL #1   Title Patient will be independent in home exercise program to improve strength/mobility for better functional independence with ADLs.    Baseline 10/26: HEP given, Pt is independent and completes HEP regularly    Time 4    Period Weeks    Status Achieved    Target Date 08/08/21      PT SHORT TERM GOAL #2   Title Patient will report a worse VAS of 4/10 in LLE for improved tolerance of mobility and quality of life.    Baseline 4 of 10 in seated position but patient reports pain is up to 6-7 out of 10 at times. 09/05/2021= Patient reports pain level in ankle= 5/10 today- having more soreness due to increase use of ASO brace.    Time 4    Period Weeks    Status On-going    Target Date 10/03/21               PT Long Term Goals - 09/05/21 1017       PT LONG TERM GOAL #1   Title Patient will increase FOTO score to equal to or greater than 47%    to demonstrate statistically significant improvement in mobility and quality of life.    Baseline 10/26: 5%, 12/1:26.38%; 12/21= 40%    Time 8    Period Weeks    Status On-going    Target Date 10/31/21      PT LONG TERM  GOAL #2   Title Patient will increase 10 meter walk test to >1.16ms as to improve gait speed for better community ambulation and to reduce fall risk.    Baseline 10/26: unable to ambulate at this time 12/1:.477m with RW,.4040mwith cane; 09/05/2021= 0.6 m/s using SPC    Time 8     Period Weeks    Status On-going    Target Date 09/27/21      PT LONG TERM GOAL #3   Title Patient will increase BLE gross strength to 4+/5 as to improve functional strength for independent gait, increased standing tolerance and increased ADL ability.    Baseline 10/26: see note, 12/1: Improved knee and hip strength to 4+/5 in seated position, ankle DF, EV and Inv still require strengthenigng, see flowsheets for more info; 12/22= Patient continues to present with 4+/5 Bilateral Hip flex/abd/knee ext/flex strength and improving ankle strength= 3+/5 ankle DF/PF/EV/IV.    Time 8    Period Weeks    Status Partially Met    Target Date 10/31/21      PT LONG TERM GOAL #4   Title Patient will tolerate weightbearing for >30 minutes for increased mobility and ambulation to increase indepenence with ADLs and iADLs    Baseline 10/26; able to tolerate approximately 15 minutes: 09/06/2021= Not directly tested in clinic today however patient and husband report that is she able to stand for 30 min at home.    Time 8    Period Weeks    Status Achieved      PT LONG TERM GOAL #5   Title Patient will increase ankle ROM to 10 degrees within normal range for improved gait mechanics and reduced pain.    Baseline 10/26: see note, improved will all ROM since initial eval, see note for specific details: 09/06/2021= ROM measured at last visit 12/19 - has improved to within 10 deg of normal.    Time 8    Period Weeks    Status Achieved      Additional Long Term Goals   Additional Long Term Goals Yes      PT LONG TERM GOAL #6   Title Pt will decrease worst low back pain as reported on NPRS by at least 2 points in order to demonstrate clinically significant reduction in back pain.    Baseline 08/08/2021= LBP at worst = 7/10; 09/05/2021= 5/10 current low back pain- Will keep goal active to ensure patient's pain consistently decreases.    Time 8    Period Weeks    Status On-going    Target Date 10/31/21      PT  LONG TERM GOAL #7   Title Pt will decrease 5TSTS by at least 3 seconds in order to demonstrate clinically significant improvement in LE strength.    Baseline 09/06/2021= 21.45 sec without UE support    Time 8    Period Weeks    Status New    Target Date 10/31/21      PT LONG TERM GOAL #8   Title Pt will improve BERG by at least 3 points in order to demonstrate clinically significant improvement in balance.    Baseline 09/05/2021= 50/56    Time 8    Period Weeks    Status New    Target Date 10/31/21                   Plan - 09/05/21 0756     Clinical Impression Statement Patient continues to present with excellent  motivation and agreeable to testing for recert visit today. She has definitely made improvements with all avenues of rehab- improving overall pain with good working knowledge of pain management strategies. She also presents with improved self perceived function as seen by significant improvement in FOTO score from  initially 5% to 40% today. She presents with continued improvement in LE strength in hip/knee/ankle and added the 5x STS test to continue to measure functional LE strength. She is currently ambulating with a SPC and demo improved gait speed with 10 meter walk test (Improving from 0.4 m/s to 0.6 m/s). She continues to be most limited by pain despite her excellent motivation to improve her function. She will continue to benefit from ongoing PT interventions to continue to progress toward dynamic balance/gait activities and progressive strength in LE/Core/Back as pain allows. Patient's condition has the potential to improve in response to therapy. Maximum improvement is yet to be obtained. The anticipated improvement is attainable and reasonable in a generally predictable time.    Personal Factors and Comorbidities Age;Comorbidity 3+;Past/Current Experience;Sex;Time since onset of injury/illness/exacerbation;Transportation    Comorbidities HLD, hypothyroidism,  ostepenia, osteoporis, SVT, clotting disorder    Examination-Activity Limitations Bathing;Bed Mobility;Bend;Caring for [employer];Locomotion Level;Lift;Hygiene/Grooming;Squat;Stairs;Stand;Toileting;Transfers    Examination-Participation Restrictions Church;Cleaning;Community Activity;Driving;Interpersonal Relationship;Personal Finances;Meal Prep;Laundry;Shop;Volunteer;Yard Work;Occupation    Stability/Clinical Decision Making Evolving/Moderate complexity    Rehab Potential Good    PT Frequency 2x / week    PT Duration 8 weeks    PT Treatment/Interventions ADLs/Self Care Home Management;Aquatic Therapy;Biofeedback;Cryotherapy;Electrical Stimulation;Iontophoresis 82m/ml Dexamethasone;Moist Heat;Ultrasound;DME Instruction;Gait training;Stair training;Functional mobility training;Neuromuscular re-education;Cognitive remediation;Balance training;Therapeutic exercise;Therapeutic activities;Patient/family education;Orthotic Fit/Training;Manual techniques;Compression bandaging;Manual lymph drainage;Scar mobilization;Passive range of motion;Energy conservation;Splinting;Taping;Vasopneumatic Device;Visual/perceptual remediation/compensation    PT Next Visit Plan WB activities, Gait and balance training as appropriate; Progressive LE strength and ROM activities. Review and progress Low back/knee gentle stretching.    PT Home Exercise Plan No changes today: Discussed Log roll technique and breathing technique with core exercises.    Consulted and Agree with Plan of Care Patient             Patient will benefit from skilled therapeutic intervention in order to improve the following deficits and impairments:  Abnormal gait, Cardiopulmonary status limiting activity, Decreased activity tolerance, Decreased balance, Decreased knowledge of precautions, Decreased endurance, Decreased knowledge of use of DME, Decreased range of motion, Decreased mobility, Difficulty walking,  Decreased strength, Hypomobility, Increased edema, Impaired flexibility, Impaired perceived functional ability, Increased muscle spasms, Pain  Visit Diagnosis: Abnormality of gait and mobility  Difficulty in walking, not elsewhere classified  Muscle weakness (generalized)  Unsteadiness on feet  Bilateral low back pain without sciatica, unspecified chronicity     Problem List Patient Active Problem List   Diagnosis Date Noted   NSTEMI (non-ST elevated [place] infarction) [place] 07/14/2021   Pulmonary nodule less than 6 mm determined by computed tomography of lung 07/14/2021   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 tract infection 04/19/2021   Hepatic hemangioma 06/02/2020   Constipation 06/02/2020   Left ankle sprain 03/25/2018   Foot injury, left, initial encounter 03/25/2018   Osteoporosis of lumbar spine 02/19/2018   Subcutaneous nodule 09/16/2017   Pain and swelling of lower leg 09/04/2017   Nevus 09/04/2017   Pain of both hip joints 06/03/2017   Need for hepatitis C screening test 05/22/2017   SVT  s/p ablation 01/09/2010    Lewis Moccasin, PT 09/05/2021, 11:20 AM  [place] MAIN [facility] SERVICES [address] [place], [place], [address] Phone: [phone]   Fax:  [phone]  Name: Michelle Armstrong MRN: [id number] Date of Birth: Feb 19, 1961

## 2021-09-07 ENCOUNTER — Ambulatory Visit: Payer: Managed Care, Other (non HMO) | Admitting: Physical Therapy

## 2021-09-12 ENCOUNTER — Other Ambulatory Visit: Payer: Self-pay

## 2021-09-12 ENCOUNTER — Ambulatory Visit: Payer: Managed Care, Other (non HMO)

## 2021-09-12 ENCOUNTER — Telehealth: Payer: Self-pay | Admitting: Podiatry

## 2021-09-12 DIAGNOSIS — R269 Unspecified abnormalities of gait and mobility: Secondary | ICD-10-CM

## 2021-09-12 DIAGNOSIS — R2681 Unsteadiness on feet: Secondary | ICD-10-CM

## 2021-09-12 DIAGNOSIS — M6281 Muscle weakness (generalized): Secondary | ICD-10-CM

## 2021-09-12 DIAGNOSIS — R278 Other lack of coordination: Secondary | ICD-10-CM

## 2021-09-12 DIAGNOSIS — R262 Difficulty in walking, not elsewhere classified: Secondary | ICD-10-CM

## 2021-09-12 MED ORDER — UREA 47 % EX CREA: 1.0000 "application " | TOPICAL_CREAM | Freq: Two times a day (BID) | CUTANEOUS | 2 refills | Status: DC

## 2021-09-12 NOTE — Telephone Encounter (Signed)
Patient called stating her insurance will cover 47% of the Urea foot cream , her insurance will not cover the 40 . Pt uses [facility] in [place]. Pt would like new RX sent over.

## 2021-09-12 NOTE — Therapy (Signed)
[place] MAIN [facility] SERVICES [address] [place], [place], [address] Phone: [phone]   Fax:  [phone]  Physical Therapy Treatment  Patient Details  Name: Michelle Armstrong MRN: [id number] Date of Birth: 20-Oct-1960 Referring Provider (PT): Hildred Alamin PA   Encounter Date: 09/12/2021   PT End of Session - 09/12/21 0939     Visit Number 17    Number of Visits 32    Date for PT Re-Evaluation 10/31/21    Authorization Type Initial Eval 74/14/2395; Recert [id number]/10/3341-5/[id number]/6168    Progress Note Due on Visit 20    PT Start Time 0801    PT Stop Time 0845    PT Time Calculation (min) 44 min    Equipment Utilized During Treatment Other (comment);Gait belt   SPC/ankle brace   Activity Tolerance Patient tolerated treatment well    Behavior During Therapy [facility] for tasks assessed/performed             Past Medical History:  Diagnosis Date   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 02/2010 Echo: EF 55-60%, no rwma.   Vitamin D deficiency    low    Past Surgical History:  Procedure Laterality Date   ABDOMINAL HYSTERECTOMY  1999   Total lap hyst BSO due to endometriosis   CARDIAC ELECTROPHYSIOLOGY MAPPING AND ABLATION  2011   COLONOSCOPY WITH PROPOFOL N/A 07/10/2015   Procedure: COLONOSCOPY WITH PROPOFOL;  Surgeon: Lollie Sails, MD;  Location: [facility];  Service: Endoscopy;  Laterality: N/A;   GALLBLADDER SURGERY     HERNIA REPAIR  12/2010   LEFT  HEART CATH AND CORONARY ANGIOGRAPHY N/A 02/24/2017   Procedure: Left Heart Cath and Coronary Angiography;  Surgeon: Wellington Hampshire, MD;  Location: [facility];  Service: Cardiovascular;  Laterality: N/A;   TONSILLECTOMY      There were no vitals filed for this visit.   Subjective Assessment - 09/12/21 0926     Subjective Patient reports she did sleep well while out of town for [place] getting comfotable with continued back/hip/ankle pain. She does report more soreness with wearing ASO Brace as well and has walked a little without it.    Patient is accompained by: Family member    Pertinent History 60 yo Female s/p fall with subsequent closed non-displaced segmental fracture of left fibula on 05/17/21. Pt reports she sustained her initial injury when she was leaving a resturaunt. She reprots she was stepping onto an outdoor carpeted surface which had a unmarked, large indentation beneath the carpet which could not be noticed. When she stepped into this area, her ankle turned and resulted in the fracture and fall. She was casted for 6 weeks and has now been given a walking boot which she will wear for 6 weeks. MD order states, May gradually advance weight bearing as tolerated in the boot. Once full weightbearing,  may wean out of boot She also suffered an L1 compression fracture (25% loss of height). She reports orthopedic MD has not evaluated her ankle. She has a referral to [facility] to evaluate ankle later today. 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 As far as she is aware she has not had a clot at this point. She also has osteopenia. Pt is unable to don/doff boot independently due to difficulty bending over, husband helps her; Pt reports increased back pain with difficulty getting in/out of bed and has since been sleeping and living in a lift chair. PMH significant: Clotting disorder, HLD, hypothyroidism, Osteopenia, SVT,  vitamin D deficiency    Limitations Sitting;Lifting;Standing;Walking;House hold activities    Diagnostic tests 10/2: MRI of lumbar spine: Evolving subacute compression fracture involving the superior  endplate of L1 with mild 25% height loss and trace 2 mm bony  retropulsion. No associated stenosis.  2. Small left foraminal disc protrusion with annular fissure at  L4-5, closely approximating and potentially irritating the exiting  left L4 nerve root.  3. Mild-to-moderate facet hypertrophy at L3-4 through L5-S1. X-ray of LLE: 1.  Mildly displaced oblique extra-articular fracture of the proximal left fibular diaphysis.   2.  Nondisplaced left lateral malleolar fracture.   3.  There is no significant osteoarthrosis.    Patient Stated Goals Pt wants to be able to walk normally again without pain and with normalized gait pattern and return to previous level of function.    Currently in Pain? Yes    Pain Score 4     Pain Location Back   Low back, left mid back, left ankle   Pain Onset More than a month ago    Pain Frequency Constant    Aggravating Factors  Laying flat, walking/weight bearing    Pain Relieving Factors rest    Effect of Pain on Daily Activities Difficulty sleeping comfortable in bed; decreased activity tolerance    Multiple Pain Sites Yes             *Pre- activity pain level in low back= 4/10 on NPS  *Moist heat to low back while performing all supine/reclined activities. Patient reported the heat felt good during session.   Therapeutic Exercises:  (all performed in reclined position as patient unable to tolerate total supine or hooklye position due to increased low back/hip/thigh pain)  Instruction in how to activate Transverse abdominals contraction- VC and visual demo to perform correctly. Once she was able to understand how to activate- she performed the following:   TA contract in reclined position- Hold 5 sec x 10 reps. (VC and reminders for correct inhale/exhale)    Bridging (VC to squeeze glutes)  x 10 reps- Patient reported up to 7/10 back pain but able to perform correctly with proper hip ext/gluteal contraction.  TA with hip march (alt LE's) x 6-8 rep- *pain in left upper thigh and low back on left; no significant difficulty with right LE TA with hip Abd (bend knee/hooklye position) x 10 reps each  TA with heel raises  x 6-8 reps with *pain in upper thigh/hips/low back.   7/10 back pain reported after these activities  Ankle strengthening: DF, PF, IV, EV using RTB x approx 15 reps each with VC for eccentric control.   Active Scapular row (no weight)  x 10 reps with VC to squeeze shoulder blades toward each other- Patient able to complete with good understanding of  activation of rhomboids.    Access Code: [email or web address] URL: [email or web address] Date: 09/12/2021 Prepared by: Sande Brothers, PT  Exercises Seated Scapular Retraction - 1 x daily - 3 x weekly - 3 sets - 10 reps Supine Bridge - 1 x daily - 3 x weekly - 3 sets - 10 reps Hooklying Transversus Abdominis Palpation - 1 x daily - 3 x weekly - 3 sets - 10 reps Bent Knee Fallouts - 1 x daily - 3 x weekly - 3 sets - 10 reps     Manual Therapy:   Grade II-III talocrual Joint mobs x 30 bouts PA/AP within pain tolerance.  PROM to left ankle DF in long sit position                   PT Education - 09/12/21 0935     Education Details Education on activation of core muscles- how to locate and use correctly.    Person(s) Educated Patient;Spouse    Methods Explanation;Demonstration;Tactile cues;Verbal cues;Handout    Comprehension Verbalized understanding;Returned demonstration;Verbal cues required;Tactile cues required;Need further instruction              PT Short Term Goals - 09/05/21 1009       PT SHORT TERM GOAL #1   Title Patient will be independent in home exercise program to improve strength/mobility for better functional independence with ADLs.     Baseline 10/26: HEP given, Pt is independent and completes HEP regularly    Time 4    Period Weeks    Status Achieved    Target Date 08/08/21      PT SHORT TERM GOAL #2   Title Patient will report a worse VAS of 4/10 in LLE for improved tolerance of mobility and quality of life.    Baseline 4 of 10 in seated position but patient reports pain is up to 6-7 out of 10 at times. 09/05/2021= Patient reports pain level in ankle= 5/10 today- having more soreness due to increase use of ASO brace.    Time 4    Period Weeks    Status On-going    Target Date 10/03/21               PT Long Term Goals - 09/05/21 1017       PT LONG TERM GOAL #1   Title Patient will increase FOTO score to equal to or greater than 47%    to demonstrate statistically significant improvement in mobility and quality of life.    Baseline 10/26: 5%, 12/1:26.38%; 12/21= 40%    Time 8    Period Weeks    Status On-going    Target Date 10/31/21      PT LONG TERM GOAL #2   Title Patient will increase 10 meter walk test to >1.67ms as to improve gait speed for better community ambulation and to reduce fall risk.    Baseline 10/26: unable to ambulate at this time 12/1:.439m with RW,.4048mwith cane; 09/05/2021= 0.6 m/s using SPC    Time 8    Period Weeks    Status On-going    Target Date 09/27/21      PT LONG TERM GOAL #3   Title Patient will increase BLE gross strength to 4+/5 as to improve functional strength for independent gait, increased standing tolerance and increased ADL ability.    Baseline 10/26: see note, 12/1: Improved knee and hip strength to 4+/5 in seated position, ankle DF, EV and Inv still require strengthenigng, see flowsheets  for more info; 12/22= Patient continues to present with 4+/5 Bilateral Hip flex/abd/knee ext/flex strength and improving ankle strength= 3+/5 ankle DF/PF/EV/IV.    Time 8    Period Weeks    Status Partially Met    Target Date 10/31/21      PT LONG TERM GOAL #4   Title  Patient will tolerate weightbearing for >30 minutes for increased mobility and ambulation to increase indepenence with ADLs and iADLs    Baseline 10/26; able to tolerate approximately 15 minutes: 09/06/2021= Not directly tested in clinic today however patient and husband report that is she able to stand for 30 min at home.    Time 8    Period Weeks    Status Achieved      PT LONG TERM GOAL #5   Title Patient will increase ankle ROM to 10 degrees within normal range for improved gait mechanics and reduced pain.    Baseline 10/26: see note, improved will all ROM since initial eval, see note for specific details: 09/06/2021= ROM measured at last visit 12/19 - has improved to within 10 deg of normal.    Time 8    Period Weeks    Status Achieved      Additional Long Term Goals   Additional Long Term Goals Yes      PT LONG TERM GOAL #6   Title Pt will decrease worst low back pain as reported on NPRS by at least 2 points in order to demonstrate clinically significant reduction in back pain.    Baseline 08/08/2021= LBP at worst = 7/10; 09/05/2021= 5/10 current low back pain- Will keep goal active to ensure patient's pain consistently decreases.    Time 8    Period Weeks    Status On-going    Target Date 10/31/21      PT LONG TERM GOAL #7   Title Pt will decrease 5TSTS by at least 3 seconds in order to demonstrate clinically significant improvement in LE strength.    Baseline 09/06/2021= 21.45 sec without UE support    Time 8    Period Weeks    Status New    Target Date 10/31/21      PT LONG TERM GOAL #8   Title Pt will improve BERG by at least 3 points in order to demonstrate clinically significant improvement in balance.    Baseline 09/05/2021= 50/56    Time 8    Period Weeks    Status New    Target Date 10/31/21                   Plan - 09/12/21 0940     Clinical Impression Statement Patient presented today with good motivation for all interventions today. She was able  to tolerate reclined position for majority of visit with assist of moist heat to low back. She continues to report increased pain with position and some of activities involving marching or heel slides (mostly upper left thigh and low back). She performed well functional again despite increased pain in back most likely related to positioning. She is responsive to all feedback and verbal cues to perform all activities well- Continues to struggle initially with correct breathing but did improve throughout session. Issued some of core exercises for home program and patient will benefit from review next session. Patient will continue to benefit from skilled physical therapy intervention in order to improve her lower extremity strength, ambulatory capacity, pain, and improve her overall quality of life.  Personal Factors and Comorbidities Age;Comorbidity 3+;Past/Current Experience;Sex;Time since onset of injury/illness/exacerbation;Transportation    Comorbidities HLD, hypothyroidism, ostepenia, osteoporis, SVT, clotting disorder    Examination-Activity Limitations Bathing;Bed Mobility;Bend;Caring for [employer];Locomotion Level;Lift;Hygiene/Grooming;Squat;Stairs;Stand;Toileting;Transfers    Examination-Participation Restrictions Church;Cleaning;Community Activity;Driving;Interpersonal Relationship;Personal Finances;Meal Prep;Laundry;Shop;Volunteer;Yard Work;Occupation    Stability/Clinical Decision Making Evolving/Moderate complexity    Rehab Potential Good    PT Frequency 2x / week    PT Duration 8 weeks    PT Treatment/Interventions ADLs/Self Care Home Management;Aquatic Therapy;Biofeedback;Cryotherapy;Electrical Stimulation;Iontophoresis 72m/ml Dexamethasone;Moist Heat;Ultrasound;DME Instruction;Gait training;Stair training;Functional mobility training;Neuromuscular re-education;Cognitive remediation;Balance training;Therapeutic exercise;Therapeutic  activities;Patient/family education;Orthotic Fit/Training;Manual techniques;Compression bandaging;Manual lymph drainage;Scar mobilization;Passive range of motion;Energy conservation;Splinting;Taping;Vasopneumatic Device;Visual/perceptual remediation/compensation    PT Next Visit Plan WB activities, Gait and balance training as appropriate; Progressive LE strength and ROM activities. Review and progress Low back/knee gentle stretching.    PT Home Exercise Plan 09/12/2021=Access Code: [email or web address] URL: [email or web address]    Consulted and Agree with Plan of Care Patient             Patient will benefit from skilled therapeutic intervention in order to improve the following deficits and impairments:  Abnormal gait, Cardiopulmonary status limiting activity, Decreased activity tolerance, Decreased balance, Decreased knowledge of precautions, Decreased endurance, Decreased knowledge of use of DME, Decreased range of motion, Decreased mobility, Difficulty walking, Decreased strength, Hypomobility, Increased edema, Impaired flexibility, Impaired perceived functional ability, Increased muscle spasms, Pain  Visit Diagnosis: Abnormality of gait and mobility  Difficulty in walking, not elsewhere classified  Muscle weakness (generalized)  Other lack of coordination  Unsteadiness on feet     Problem List Patient Active Problem List   Diagnosis Date Noted   NSTEMI (non-ST elevated myocardial infarction) ([place] 07/14/2021   Pulmonary nodule less than 6 mm determined by computed tomography of lung 07/14/2021   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 tract infection 04/19/2021   Hepatic hemangioma 06/02/2020   Constipation 06/02/2020   Left ankle sprain 03/25/2018   Foot injury, left, initial encounter  03/25/2018   Osteoporosis of lumbar spine 02/19/2018   Subcutaneous nodule 09/16/2017   Pain and swelling of lower leg 09/04/2017   Nevus 09/04/2017   Pain of both hip joints 06/03/2017   Need for hepatitis C screening test 05/22/2017   SVT s/p ablation 01/09/2010    JLewis Moccasin PT 09/12/2021, 10:09 AM  [address]age[address] [place] [address]Phone: [phone]  Fax:  [phone] Name: Michelle SACKRIDERMRN: [id number] of Birth: [birth year]

## 2021-09-12 NOTE — Telephone Encounter (Signed)
Called pt let her know RX was sent over with refills.

## 2021-09-14 ENCOUNTER — Ambulatory Visit: Payer: Managed Care, Other (non HMO) | Admitting: Physical Therapy

## 2021-09-14 ENCOUNTER — Other Ambulatory Visit: Payer: Self-pay

## 2021-09-14 DIAGNOSIS — R2681 Unsteadiness on feet: Secondary | ICD-10-CM

## 2021-09-14 DIAGNOSIS — R269 Unspecified abnormalities of gait and mobility: Secondary | ICD-10-CM | POA: Diagnosis not present

## 2021-09-14 DIAGNOSIS — M6281 Muscle weakness (generalized): Secondary | ICD-10-CM

## 2021-09-14 DIAGNOSIS — R262 Difficulty in walking, not elsewhere classified: Secondary | ICD-10-CM

## 2021-09-14 NOTE — Therapy (Signed)
[place] MAIN [facility] SERVICES [address] [place], [place], [address] Phone: [phone]   Fax:  [phone]  Physical Therapy Treatment  Patient Details  Name: Michelle Armstrong MRN: [id number] Date of Birth: 03-23-1961 Referring Provider (PT): Hildred Alamin PA   Encounter Date: 09/14/2021   PT End of Session - 09/14/21 0750     Visit Number 18    Number of Visits 32    Date for PT Re-Evaluation 10/31/21    Authorization Type Initial Eval 54/00/8676; Recert 19/50/9326-03/28/4579    Progress Note Due on Visit 20    PT Start Time 0801    PT Stop Time 0845    PT Time Calculation (min) 44 min    Equipment Utilized During Treatment Other (comment);Gait belt   SPC/ankle brace   Activity Tolerance Patient tolerated treatment well    Behavior During Therapy [facility] for tasks assessed/performed             Past Medical History:  Diagnosis Date   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 02/2010 Echo: EF 55-60%, no rwma.   Vitamin D deficiency    low    Past Surgical History:  Procedure Laterality Date   ABDOMINAL HYSTERECTOMY  1999   Total lap hyst BSO due to endometriosis   CARDIAC ELECTROPHYSIOLOGY MAPPING AND ABLATION  2011   COLONOSCOPY WITH PROPOFOL N/A 07/10/2015   Procedure: COLONOSCOPY WITH PROPOFOL;  Surgeon: Lollie Sails, MD;  Location: [facility] ENDOSCOPY;  Service: Endoscopy;  Laterality: N/A;   GALLBLADDER SURGERY     HERNIA REPAIR  12/2010   LEFT  HEART CATH AND CORONARY ANGIOGRAPHY N/A 02/24/2017   Procedure: Left Heart Cath and Coronary Angiography;  Surgeon: Wellington Hampshire, MD;  Location: [place] CV LAB;  Service: Cardiovascular;  Laterality: N/A;   TONSILLECTOMY      There were no vitals filed for this visit.   Subjective Assessment - 09/14/21 0750     Subjective Pt reports she is having some pain today following walking at increased amount outside of ASO brace over the past day. Pt localized increased pain to lateral maleolus and posterior to lateral maleolus. Pt reports continued pain and discomfort in upper back, low back and hips but does report some improvements in her knee pain since coming out of CAM walker boot. Pt reports she has only used CAM walker boot for protective purposes such as time spent around grandchildren and ascending and descending steps over the holidays, otherwise she has been ambulating and completing her daily tasks around the house using ASO brace or no brace per recomendations from PT.    Patient is accompained by: Family member    Pertinent History 60 yo Female s/p fall with subsequent closed non-displaced segmental fracture of left fibula on 05/17/21. Pt reports she sustained her initial injury when she was leaving a resturaunt. She reprots  she was stepping onto an outdoor carpeted surface which had a unmarked, large indentation beneath the carpet which could not be noticed. When she stepped into this area, her ankle turned and resulted in the fracture and fall. She was casted for 6 weeks and has now been given a walking boot which she will wear for 6 weeks. MD order states, May gradually advance weight bearing as tolerated in the boot. Once full weightbearing, may wean out of boot She also suffered an L1 compression fracture (25% loss of height). She reports orthopedic MD has not evaluated her ankle. She has a referral to [facility] to evaluate ankle later today. 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 As far as she is aware she has not had a clot at this point. She also has osteopenia. Pt is unable to don/doff boot independently due to difficulty bending over, husband helps her; Pt reports increased back pain with difficulty getting in/out of bed and has since been sleeping and living in a lift chair. PMH significant: Clotting disorder, HLD, hypothyroidism, Osteopenia, SVT, vitamin D deficiency    Limitations Sitting;Lifting;Standing;Walking;House hold activities    Diagnostic tests 10/2: MRI of lumbar spine: Evolving subacute compression fracture involving the superior  endplate of L1 with mild 25% height loss and trace 2 mm bony  retropulsion. No associated stenosis.  2. Small left foraminal disc protrusion with annular fissure at  L4-5, closely approximating and potentially irritating the exiting  left L4 nerve root.  3. Mild-to-moderate facet hypertrophy at L3-4 through L5-S1. X-ray of LLE: 1.  Mildly displaced oblique extra-articular fracture of the proximal left fibular diaphysis.   2.  Nondisplaced left lateral malleolar fracture.   3.  There is no significant osteoarthrosis.    Patient Stated Goals Pt wants to be able to walk normally again without pain and with normalized gait pattern and return to previous level of function.    Pain Onset More than a month ago                Exercise/Activity Sets x Reps Resistance Assistance  Comments  Marching on airex pad  X 2 min   B UE Cues to not put significant weight through UE  Rocker board anterior/posterior rocking 2 x 1 min   B UE Cues to not put significant weight through UE -discomfort noted in hips    Rocker Board lateral rocking  1 x 1 min   B UE Cues to not put significant weight through UE Discomfort noted in hips     Manual therapy: stretching and garde 2/3 joint mobs, flexion of toes on L LE (primarily first and second digits)  X 3 min     -Improvement in first and  second digit range of motion this session compared to previous sessions  Manual therapy: ankle DF stretch  2 x X 45 sec    - stretch felt in area of gastrocnemius muscle   BAPS board ant/post, lateral, circles (clockwise and counterclockwise)  1 min ea   - level 3  -consider progression next session to level 4 or adding weights to target   Treadmill pawing  X 3 min   B UE On treadmill at 1 MPH with weight bearing through B UE and R LE having patient utilize LLE through normal gait pattern.  -cues for hip extension and toe off in late stance phase of gait.  -significant discomfort due to  treadmill positioning. Discomfort in back and hips primarily -consider well zone treadmill if attempted future sessions   walking X 170 feet  SPC -improved gait pattern with increased hip extension and improved toe off noted  Hip flexor stretch  2 x 30 sec    Standing with LLE on mat table and R LE on floor, Cues for posterior pelvic tilt to improve stretch in hip flexor compared to quad. Some discomfort in back due to positioning.                                        PT Education - 09/14/21 0946     Education Details Education on gait pattern and tredmill pawing exercsise    Person(s) Educated Patient;Spouse    Methods Explanation;Demonstration    Comprehension Verbalized understanding              PT Short Term Goals - 09/05/21 1009       PT SHORT TERM GOAL #1   Title Patient will be independent in home exercise program to improve strength/mobility for better functional independence with ADLs.    Baseline 10/26: HEP given, Pt is independent and completes HEP regularly    Time 4    Period Weeks    Status Achieved    Target Date 08/08/21      PT SHORT TERM GOAL #2   Title Patient will report a worse VAS of 4/10 in LLE for improved tolerance of mobility and quality of life.    Baseline 4 of 10 in seated position but patient reports pain is up to 6-7 out of 10 at times.  09/05/2021= Patient reports pain level in ankle= 5/10 today- having more soreness due to increase use of ASO brace.    Time 4    Period Weeks    Status On-going    Target Date 10/03/21               PT Long Term Goals - 09/05/21 1017       PT LONG TERM GOAL #1   Title Patient will increase FOTO score to equal to or greater than 47%    to demonstrate statistically significant improvement in mobility and quality of life.    Baseline 10/26: 5%, 12/1:26.38%; 12/21= 40%    Time 8    Period Weeks    Status On-going    Target Date 10/31/21      PT LONG TERM GOAL #2   Title Patient will increase 10 meter walk test to >1.67ms as to improve gait speed for better community ambulation and to reduce fall risk.    Baseline 10/26: unable to ambulate at this time 12/1:.413m with RW,.4026mwith cane; 09/05/2021= 0.6 m/s using SPC    Time 8    Period Weeks    Status On-going    Target Date 09/27/21      PT LONG TERM GOAL #3   Title Patient will increase BLE gross strength to 4+/5 as to improve functional strength for independent gait, increased standing tolerance and increased ADL ability.    Baseline 10/26: see note, 12/1: Improved knee and hip strength to 4+/5 in seated position, ankle DF, EV and Inv still require strengthenigng, see flowsheets for more info; 12/22= Patient continues to present with 4+/5 Bilateral Hip flex/abd/knee ext/flex strength and improving ankle strength= 3+/5 ankle DF/PF/EV/IV.    Time 8  Period Weeks    Status Partially Met    Target Date 10/31/21      PT LONG TERM GOAL #4   Title Patient will tolerate weightbearing for >30 minutes for increased mobility and ambulation to increase indepenence with ADLs and iADLs    Baseline 10/26; able to tolerate approximately 15 minutes: 09/06/2021= Not directly tested in clinic today however patient and husband report that is she able to stand for 30 min at home.    Time 8    Period Weeks    Status Achieved      PT LONG  TERM GOAL #5   Title Patient will increase ankle ROM to 10 degrees within normal range for improved gait mechanics and reduced pain.    Baseline 10/26: see note, improved will all ROM since initial eval, see note for specific details: 09/06/2021= ROM measured at last visit 12/19 - has improved to within 10 deg of normal.    Time 8    Period Weeks    Status Achieved      Additional Long Term Goals   Additional Long Term Goals Yes      PT LONG TERM GOAL #6   Title Pt will decrease worst low back pain as reported on NPRS by at least 2 points in order to demonstrate clinically significant reduction in back pain.    Baseline 08/08/2021= LBP at worst = 7/10; 09/05/2021= 5/10 current low back pain- Will keep goal active to ensure patient's pain consistently decreases.    Time 8    Period Weeks    Status On-going    Target Date 10/31/21      PT LONG TERM GOAL #7   Title Pt will decrease 5TSTS by at least 3 seconds in order to demonstrate clinically significant improvement in LE strength.    Baseline 09/06/2021= 21.45 sec without UE support    Time 8    Period Weeks    Status New    Target Date 10/31/21      PT LONG TERM GOAL #8   Title Pt will improve BERG by at least 3 points in order to demonstrate clinically significant improvement in balance.    Baseline 09/05/2021= 50/56    Time 8    Period Weeks    Status New    Target Date 10/31/21                   Plan - 09/14/21 6720     Clinical Impression Statement Patient presents to therapy with excellent motivation for completion of all interventions this session.  Began utilizing biomechanical ankle platform system board today and although patient had some discomfort in her ankle overall she tolerated well with able to demonstrate good ankle control on level 3 using the support.  Patient presents with improved toe flexion range of motion but does report continued pain and discomfort in area of first metatarsophalangeal joint as  well as area on the medial aspect of the plantar fascia on her left lower extremity.  Attempted treadmill following exercise today and although exercise had intended effect of improving patient's gait mechanics patient did have reports of hip, lower back, and upper back pain due to positioning on treadmill.  Recommended to try this and well zone gym if attempted in future sessions and patient requests any equipment utilized and well zone be wiped down with cleaning wipes prior to use to ensure cleanliness.  Patient will continue to benefit from skilled physical therapy intervention in  order to improve her lower extremity strength, gait pattern, ambulatory capacity, pain, as well as to improve her overall quality of life.    Personal Factors and Comorbidities Age;Comorbidity 3+;Past/Current Experience;Sex;Time since onset of injury/illness/exacerbation;Transportation    Comorbidities HLD, hypothyroidism, ostepenia, osteoporis, SVT, clotting disorder    Examination-Activity Limitations Bathing;Bed Mobility;Bend;Caring for [employer];Locomotion Level;Lift;Hygiene/Grooming;Squat;Stairs;Stand;Toileting;Transfers    Examination-Participation Restrictions Church;Cleaning;Community Activity;Driving;Interpersonal Relationship;Personal Finances;Meal Prep;Laundry;Shop;Volunteer;Yard Work;Occupation    Stability/Clinical Decision Making Evolving/Moderate complexity    Rehab Potential Good    PT Frequency 2x / week    PT Duration 8 weeks    PT Treatment/Interventions ADLs/Self Care Home Management;Aquatic Therapy;Biofeedback;Cryotherapy;Electrical Stimulation;Iontophoresis 40m/ml Dexamethasone;Moist Heat;Ultrasound;DME Instruction;Gait training;Stair training;Functional mobility training;Neuromuscular re-education;Cognitive remediation;Balance training;Therapeutic exercise;Therapeutic activities;Patient/family education;Orthotic Fit/Training;Manual techniques;Compression  bandaging;Manual lymph drainage;Scar mobilization;Passive range of motion;Energy conservation;Splinting;Taping;Vasopneumatic Device;Visual/perceptual remediation/compensation    PT Next Visit Plan WB activities, Gait and balance training as appropriate; Progressive LE strength and ROM activities. Review and progress Low back/knee gentle stretching.    PT Home Exercise Plan 09/12/2021=Access Code: [email or web address] URL: [email or web address]    Consulted and Agree with Plan of Care Patient             Patient will benefit from skilled therapeutic intervention in order to improve the following deficits and impairments:  Abnormal gait, Cardiopulmonary status limiting activity, Decreased activity tolerance, Decreased balance, Decreased knowledge of precautions, Decreased endurance, Decreased knowledge of use of DME, Decreased range of motion, Decreased mobility, Difficulty walking, Decreased strength, Hypomobility, Increased edema, Impaired flexibility, Impaired perceived functional ability, Increased muscle spasms, Pain  Visit Diagnosis: Abnormality of gait and mobility  Difficulty in walking, not elsewhere classified  Muscle weakness (generalized)  Unsteadiness on feet     Problem List Patient Active Problem List   Diagnosis Date Noted   [place]STEMI (non-ST elevated 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 tract infection 04/19/2021   Hepatic hemangioma 06/02/2020   Constipation 06/02/2020   Left ankle sprain 03/25/2018   Foot injury, left, initial encounter 03/25/2018   Osteoporosis of lumbar spine 02/19/2018   Subcutaneous nodule 09/16/2017   Pain and swelling of lower  leg 09/04/2017   Nevus 09/04/2017   Pain of both hip joints 06/03/2017   Need for hepatitis C screening test 05/22/2017   SVT s/p ablation 01/09/2010    CParticia Lather PT 09/14/2021, 10:02 AM  [place] [address]Phone: [phone]  Fax:  [phone] Name: Michelle BRADWAYMRN: [id number] of Birth: [birth year]

## 2021-09-16 HISTORY — PX: OTHER SURGICAL HISTORY: SHX169

## 2021-09-19 ENCOUNTER — Ambulatory Visit: Payer: Managed Care, Other (non HMO) | Admitting: Physical Therapy

## 2021-09-19 ENCOUNTER — Other Ambulatory Visit: Payer: Self-pay

## 2021-09-19 ENCOUNTER — Ambulatory Visit: Payer: Managed Care, Other (non HMO) | Attending: Orthopedic Surgery | Admitting: Physical Therapy

## 2021-09-19 DIAGNOSIS — M79605 Pain in left leg: Secondary | ICD-10-CM | POA: Insufficient documentation

## 2021-09-19 DIAGNOSIS — R2681 Unsteadiness on feet: Secondary | ICD-10-CM | POA: Diagnosis present

## 2021-09-19 DIAGNOSIS — M6281 Muscle weakness (generalized): Secondary | ICD-10-CM | POA: Diagnosis present

## 2021-09-19 DIAGNOSIS — M79662 Pain in left lower leg: Secondary | ICD-10-CM | POA: Insufficient documentation

## 2021-09-19 DIAGNOSIS — R278 Other lack of coordination: Secondary | ICD-10-CM | POA: Diagnosis present

## 2021-09-19 DIAGNOSIS — R262 Difficulty in walking, not elsewhere classified: Secondary | ICD-10-CM | POA: Insufficient documentation

## 2021-09-19 DIAGNOSIS — M545 Low back pain, unspecified: Secondary | ICD-10-CM | POA: Diagnosis present

## 2021-09-19 DIAGNOSIS — R269 Unspecified abnormalities of gait and mobility: Secondary | ICD-10-CM | POA: Diagnosis present

## 2021-09-19 DIAGNOSIS — R2689 Other abnormalities of gait and mobility: Secondary | ICD-10-CM | POA: Diagnosis present

## 2021-09-19 DIAGNOSIS — G8929 Other chronic pain: Secondary | ICD-10-CM | POA: Insufficient documentation

## 2021-09-19 NOTE — Therapy (Signed)
[place] MAIN [facility] SERVICES [address] [place], [place], [address] Phone: [phone]   Fax:  [phone]  Physical Therapy Treatment  Patient Details  Name: Michelle Armstrong MRN: [id number] Date of Birth: 1960-11-20 Referring Provider (PT): Hildred Alamin PA   Encounter Date: 09/19/2021   PT End of Session - 09/19/21 0857     Visit Number 19    Number of Visits 32    Date for PT Re-Evaluation 10/31/21    Authorization Type Initial Eval 73/42/8768; Recert 11/57/2620-3/55/9741    Progress Note Due on Visit 20    PT Start Time 0800    PT Stop Time 6384    PT Time Calculation (min) 44 min    Equipment Utilized During Treatment Other (comment);Gait belt   SPC/ankle brace   Activity Tolerance Patient tolerated treatment well    Behavior During Therapy [facility] for tasks assessed/performed             Past Medical History:  Diagnosis Date   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 02/2010 Echo: EF 55-60%, no rwma.   Vitamin D deficiency    low    Past Surgical History:  Procedure Laterality Date   ABDOMINAL HYSTERECTOMY  1999   Total lap hyst BSO due to endometriosis   CARDIAC ELECTROPHYSIOLOGY MAPPING AND ABLATION  2011   COLONOSCOPY WITH PROPOFOL N/A 07/10/2015   Procedure: COLONOSCOPY WITH PROPOFOL;  Surgeon: Lollie Sails, MD;  Location: [facility] ENDOSCOPY;  Service: Endoscopy;  Laterality: N/A;   GALLBLADDER SURGERY     HERNIA REPAIR  12/2010   LEFT  HEART CATH AND CORONARY ANGIOGRAPHY N/A 02/24/2017   Procedure: Left Heart Cath and Coronary Angiography;  Surgeon: Wellington Hampshire, MD;  Location: [facility] CV LAB;  Service: Cardiovascular;  Laterality: N/A;   TONSILLECTOMY      There were no vitals filed for this visit.   Subjective Assessment - 09/19/21 0806     Subjective Pt reports she is doing well today. She was able to walk from hospital lobby to waiting area with [facility]. Pt reports she has been walking in community setting including grocery store and although she continues to have some pain was able to manage this environment well.    Patient is accompained by: Family member    Pertinent History 61 yo Female s/p fall with subsequent closed non-displaced segmental fracture of left fibula on 05/17/21. Pt reports she sustained her initial injury when she was leaving a resturaunt. She reprots she was stepping onto an outdoor carpeted surface which had a unmarked, large indentation beneath the carpet which could not be noticed. When she stepped into this area, her ankle turned and resulted in the fracture and fall. She was casted for 6 weeks and has now been given a walking boot which she will wear for 6 weeks. MD order states, May gradually  advance weight bearing as tolerated in the boot. Once full weightbearing, may wean out of boot She also suffered an L1 compression fracture (25% loss of height). She reports orthopedic MD has not evaluated her ankle. She has a referral to [facility] to evaluate ankle later today. 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 As far as she is aware she has not had a clot at this point. She also has osteopenia. Pt is unable to don/doff boot independently due to difficulty bending over, husband helps her; Pt reports increased back pain with difficulty getting in/out of bed and has since been sleeping and living in a lift chair. PMH significant: Clotting disorder,  HLD, hypothyroidism, Osteopenia, SVT, vitamin D deficiency    Limitations Sitting;Lifting;Standing;Walking;House hold activities    Diagnostic tests 10/2: MRI of lumbar spine: Evolving subacute compression fracture involving the superior  endplate of L1 with mild 25% height loss and trace 2 mm bony  retropulsion. No associated stenosis.  2. Small left foraminal disc protrusion with annular fissure at  L4-5, closely approximating and potentially irritating the exiting  left L4 nerve root.  3. Mild-to-moderate facet hypertrophy at L3-4 through L5-S1. X-ray of LLE: 1.  Mildly displaced oblique extra-articular fracture of the proximal left fibular diaphysis.   2.  Nondisplaced left lateral malleolar fracture.   3.  There is no significant osteoarthrosis.    Patient Stated Goals Pt wants to be able to walk normally again without pain and with normalized gait pattern and return to previous level of function.    Currently in Pain? Yes    Pain Onset More than a month ago             Exercise/Activity Sets x Reps Resistance Assistance  Comments        Rocker board anterior/posterior rocking 1 x 1 min   No UE assist Cues to not put significant weight through UE -discomfort noted in hips    Rocker Board lateral rocking  1 x 1 min   No UE assist Cues to not put significant weight through UE Discomfort noted in hips     Manual therapy: stretching and grade 2/3 joint mobs, flexion of toes on L LE (primarily first and second digits)  X 7 min     -Improvement in first and second digit range of motion this session compared to previous sessions  Manual therapy: ankle DF stretch  2 x X 45 sec    - stretch felt in area of gastrocnemius muscle   BAPS board ant/post, lateral, circles (clockwise and counterclockwise)  1 min ea   - level 4  -consider progression next session to level 4 or adding weights to target   Toe splaying with focus on great toe - seated     Unable to copmlete on either side, performed self  left great toe abduction stretch instead.   Walking in // bars with no UE support X 6 laps through total    -improved gait pattern with increased hip extension and improved toe off noted  Hip flexor stretch  1 x 30 sec    Standing with LLE on mat table and R LE on floor, Pt reports some contralateral disocmfort in the low back but tolerable. Improved anterior hip pain with ambulation following this stretch.  PT Education - 09/19/21 0856     Education Details Left Hip flexor stretch on chair at home setting. Also educated to breath out with concentric/effortful portion of her exercises and breath in with eccentric/lowering phases of her exercises, particularly with her core related exercises.    Person(s) Educated Patient    Methods Explanation;Demonstration    Comprehension Verbalized understanding;Returned demonstration              PT Short Term Goals - 09/05/21 1009       PT SHORT TERM GOAL #1   Title Patient will be independent in home exercise program to improve strength/mobility for better functional independence with ADLs.    Baseline 10/26: HEP given, Pt is independent and completes HEP regularly    Time 4    Period Weeks    Status Achieved    Target Date 08/08/21      PT SHORT TERM GOAL #2   Title Patient will report a worse VAS of 4/10 in LLE for improved tolerance of mobility and quality of life.    Baseline 4 of 10 in seated position but patient reports pain is up to 6-7 out of 10 at times. 09/05/2021= Patient reports pain level in ankle= 5/10 today- having more soreness due to increase use of ASO brace.    Time 4    Period Weeks    Status On-going    Target Date 10/03/21               PT Long Term Goals - 09/05/21 1017       PT LONG TERM GOAL #1   Title Patient will increase FOTO score to equal to or greater than 47%    to demonstrate statistically significant improvement in  mobility and quality of life.    Baseline 10/26: 5%, 12/1:26.38%; 12/21= 40%    Time 8    Period Weeks    Status On-going    Target Date 10/31/21      PT LONG TERM GOAL #2   Title Patient will increase 10 meter walk test to >1.57ms as to improve gait speed for better community ambulation and to reduce fall risk.    Baseline 10/26: unable to ambulate at this time 12/1:.482m with RW,.4064mwith cane; 09/05/2021= 0.6 m/s using SPC    Time 8    Period Weeks    Status On-going    Target Date 09/27/21      PT LONG TERM GOAL #3   Title Patient will increase BLE gross strength to 4+/5 as to improve functional strength for independent gait, increased standing tolerance and increased ADL ability.    Baseline 10/26: see note, 12/1: Improved knee and hip strength to 4+/5 in seated position, ankle DF, EV and Inv still require strengthenigng, see flowsheets for more info; 12/22= Patient continues to present with 4+/5 Bilateral Hip flex/abd/knee ext/flex strength and improving ankle strength= 3+/5 ankle DF/PF/EV/IV.    Time 8    Period Weeks    Status Partially Met    Target Date 10/31/21      PT LONG TERM GOAL #4   Title Patient will tolerate weightbearing for >30 minutes for increased mobility and ambulation to increase indepenence with ADLs and iADLs    Baseline 10/26; able to tolerate approximately 15 minutes: 09/06/2021= Not directly tested in clinic today however patient and husband report that is she able to stand for 30 min at home.    Time 8    Period Weeks  Status Achieved      PT LONG TERM GOAL #5   Title Patient will increase ankle ROM to 10 degrees within normal range for improved gait mechanics and reduced pain.    Baseline 10/26: see note, improved will all ROM since initial eval, see note for specific details: 09/06/2021= ROM measured at last visit 12/19 - has improved to within 10 deg of normal.    Time 8    Period Weeks    Status Achieved      Additional Long Term Goals    Additional Long Term Goals Yes      PT LONG TERM GOAL #6   Title Pt will decrease worst low back pain as reported on NPRS by at least 2 points in order to demonstrate clinically significant reduction in back pain.    Baseline 08/08/2021= LBP at worst = 7/10; 09/05/2021= 5/10 current low back pain- Will keep goal active to ensure patient's pain consistently decreases.    Time 8    Period Weeks    Status On-going    Target Date 10/31/21      PT LONG TERM GOAL #7   Title Pt will decrease 5TSTS by at least 3 seconds in order to demonstrate clinically significant improvement in LE strength.    Baseline 09/06/2021= 21.45 sec without UE support    Time 8    Period Weeks    Status New    Target Date 10/31/21      PT LONG TERM GOAL #8   Title Pt will improve BERG by at least 3 points in order to demonstrate clinically significant improvement in balance.    Baseline 09/05/2021= 50/56    Time 8    Period Weeks    Status New    Target Date 10/31/21                   Plan - 09/19/21 0857     Clinical Impression Statement Patient presents to therapy with excellent motivation for completion of all interventions this session. Continued utilizing biomechanical ankle platform system board today and although patient had some discomfort in her ankle overall she tolerated well with able to demonstrate good ankle control on level 4. Pt making great progress with gait ad ambulation but still has pain and discomfort in her back, hips, and left knee and ankle but she does report all of these are improving. Patient will continue to benefit from skilled physical therapy intervention in order to improve her lower extremity strength, gait pattern, ambulatory capacity, pain, as well as to improve her overall quality of life.    Personal Factors and Comorbidities Age;Comorbidity 3+;Past/Current Experience;Sex;Time since onset of injury/illness/exacerbation;Transportation    Comorbidities HLD,  hypothyroidism, ostepenia, osteoporis, SVT, clotting disorder    Examination-Activity Limitations Bathing;Bed Mobility;Bend;Caring for [employer];Locomotion Level;Lift;Hygiene/Grooming;Squat;Stairs;Stand;Toileting;Transfers    Examination-Participation Restrictions Church;Cleaning;Community Activity;Driving;Interpersonal Relationship;Personal Finances;Meal Prep;Laundry;Shop;Volunteer;Yard Work;Occupation    Stability/Clinical Decision Making Evolving/Moderate complexity    Rehab Potential Good    PT Frequency 2x / week    PT Duration 8 weeks    PT Treatment/Interventions ADLs/Self Care Home Management;Aquatic Therapy;Biofeedback;Cryotherapy;Electrical Stimulation;Iontophoresis 4mg /ml Dexamethasone;Moist Heat;Ultrasound;DME Instruction;Gait training;Stair training;Functional mobility training;Neuromuscular re-education;Cognitive remediation;Balance training;Therapeutic exercise;Therapeutic activities;Patient/family education;Orthotic Fit/Training;Manual techniques;Compression bandaging;Manual lymph drainage;Scar mobilization;Passive range of motion;Energy conservation;Splinting;Taping;Vasopneumatic Device;Visual/perceptual remediation/compensation    PT Next Visit Plan WB activities, Gait and balance training as appropriate; Progressive LE strength and ROM activities. Review and progress Low back/knee gentle stretching.    PT Home Exercise Plan 09/12/2021=Access Code: [email or web address]  URL:  [email or web address]    Consulted and Agree with Plan of Care Patient             Patient will benefit from skilled therapeutic intervention in order to improve the following deficits and impairments:  Abnormal gait, Cardiopulmonary status limiting activity, Decreased activity tolerance, Decreased balance, Decreased knowledge of precautions, Decreased endurance, Decreased knowledge of use of DME, Decreased range of motion, Decreased mobility, Difficulty walking,  Decreased strength, Hypomobility, Increased edema, Impaired flexibility, Impaired perceived functional ability, Increased muscle spasms, Pain  Visit Diagnosis: Abnormality of gait and mobility  Difficulty in walking, not elsewhere classified  Muscle weakness (generalized)  Unsteadiness on feet     Problem List Patient Active Problem List   Diagnosis Date Noted   NSTEMI (non-ST elevated myocardial infarction) ([place]) 07/14/2021   Pulmonary nodule less than 6 mm determined by computed tomography of lung 07/14/2021   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 tract infection 04/19/2021   Hepatic hemangioma 06/02/2020   Constipation 06/02/2020   Left ankle sprain 03/25/2018   Foot injury, left, initial encounter 03/25/2018   Osteoporosis of lumbar spine 02/19/2018   Subcutaneous nodule 09/16/2017   Pain and swelling of lower leg 09/04/2017   [place]ev[place] 09/04/2017   Pain of both hip joints 06/03/2017   Need for hepatitis C screening test 05/22/2017   SVT s/p ablation 01/09/2010    Particia Lather, PT 09/19/2021, 9:05 AM  [place] [address] [place], [place], [address] Phone: [phone]   Fax:  [phone]  Name: Michelle Armstrong MRN: [id number] Date of Birth: December 24, 1960

## 2021-09-24 ENCOUNTER — Ambulatory Visit: Payer: Managed Care, Other (non HMO) | Admitting: Physical Therapy

## 2021-09-24 ENCOUNTER — Other Ambulatory Visit: Payer: Self-pay

## 2021-09-24 ENCOUNTER — Encounter: Payer: Self-pay | Admitting: Physical Therapy

## 2021-09-24 DIAGNOSIS — R269 Unspecified abnormalities of gait and mobility: Secondary | ICD-10-CM | POA: Diagnosis not present

## 2021-09-24 DIAGNOSIS — R2681 Unsteadiness on feet: Secondary | ICD-10-CM

## 2021-09-24 DIAGNOSIS — R262 Difficulty in walking, not elsewhere classified: Secondary | ICD-10-CM

## 2021-09-24 DIAGNOSIS — M79662 Pain in left lower leg: Secondary | ICD-10-CM

## 2021-09-24 DIAGNOSIS — M6281 Muscle weakness (generalized): Secondary | ICD-10-CM

## 2021-09-24 DIAGNOSIS — R278 Other lack of coordination: Secondary | ICD-10-CM

## 2021-09-24 DIAGNOSIS — G8929 Other chronic pain: Secondary | ICD-10-CM

## 2021-09-24 DIAGNOSIS — M545 Low back pain, unspecified: Secondary | ICD-10-CM

## 2021-09-24 DIAGNOSIS — R2689 Other abnormalities of gait and mobility: Secondary | ICD-10-CM

## 2021-09-24 DIAGNOSIS — M79605 Pain in left leg: Secondary | ICD-10-CM

## 2021-09-24 NOTE — Therapy (Signed)
[place] MAIN [facility] SERVICES [address] [place], [place], [address] Phone: [phone]   Fax:  [phone]  Physical Therapy Treatment Physical Therapy Progress Note   Dates of reporting period  08/20/21   to   09/24/21   Patient Details  Name: Michelle Armstrong MRN: [id number] Date of Birth: 1961/06/10 Referring Provider (PT): Hildred Alamin PA   Encounter Date: 09/24/2021   PT End of Session - 09/24/21 0806     Visit Number 20    Number of Visits 32    Date for PT Re-Evaluation 10/31/21    Authorization Type Initial Eval 09/73/5329; Recert [id number]/[id number]/96/2229    Progress Note Due on Visit 20    PT Start Time 0802    PT Stop Time 0845    PT Time Calculation (min) 43 min    Equipment Utilized During Treatment Other (comment);Gait belt   SPC/ankle brace   Activity Tolerance Patient tolerated treatment well    Behavior During Therapy [facility] for tasks assessed/performed             Past Medical History:  Diagnosis Date   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 02/2010 Echo: EF 55-60%, no rwma.   Vitamin D deficiency    low    Past Surgical History:  Procedure Laterality Date   ABDOMINAL HYSTERECTOMY  1999   Total lap hyst BSO due to endometriosis   CARDIAC ELECTROPHYSIOLOGY MAPPING AND ABLATION  2011   COLONOSCOPY WITH PROPOFOL N/A 07/10/2015   Procedure: COLONOSCOPY WITH PROPOFOL;  Surgeon: Lollie Sails, MD;  Location: [facility] ENDOSCOPY;  Service:  Endoscopy;  Laterality: N/A;   GALLBLADDER SURGERY     HERNIA REPAIR  12/2010   LEFT HEART CATH AND CORONARY ANGIOGRAPHY N/A 02/24/2017   Procedure: Left Heart Cath and Coronary Angiography;  Surgeon: Wellington Hampshire, MD;  Location: [place] CV LAB;  Service: Cardiovascular;  Laterality: N/A;   TONSILLECTOMY      There were no vitals filed for this visit.   Subjective Assessment - 09/24/21 0805     Subjective Pt reports she is doing well today. She was able to walk from hospital lobby to waiting area with [facility]. Pt reports she has been walking in community setting including grocery store and although she continues to have some pain was able to manage this environment well. She reports being motivated to improve strength to go to son's wedding;    Patient is accompained by: Family member    Pertinent History 61 yo Female s/p fall with subsequent closed non-displaced segmental fracture of left fibula on 05/17/21. Pt reports she sustained her initial injury when she was leaving a resturaunt. She reprots she was stepping onto an outdoor carpeted surface which had a unmarked, large indentation beneath the carpet which could not be noticed. When she stepped into this area, her ankle turned and resulted  in the fracture and fall. She was casted for 6 weeks and has now been given a walking boot which she will wear for 6 weeks. MD order states, May gradually advance weight bearing as tolerated in the boot. Once full weightbearing, may wean out of boot She also suffered an L1 compression fracture (25% loss of height). She reports orthopedic MD has not evaluated her ankle. She has a referral to [facility] to evaluate ankle later today. 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 As far as she is aware she has not had a clot at this point. She also has osteopenia. Pt is unable to don/doff boot independently due to difficulty bending over, husband helps her; Pt  reports increased back pain with difficulty getting in/out of bed and has since been sleeping and living in a lift chair. PMH significant: Clotting disorder, HLD, hypothyroidism, Osteopenia, SVT, vitamin D deficiency    Limitations Sitting;Lifting;Standing;Walking;House hold activities    Diagnostic tests 10/2: MRI of lumbar spine: Evolving subacute compression fracture involving the superior  endplate of L1 with mild 25% height loss and trace 2 mm bony  retropulsion. No associated stenosis.  2. Small left foraminal disc protrusion with annular fissure at  L4-5, closely approximating and potentially irritating the exiting  left L4 nerve root.  3. Mild-to-moderate facet hypertrophy at L3-4 through L5-S1. X-ray of LLE: 1.  Mildly displaced oblique extra-articular fracture of the proximal left fibular diaphysis.   2.  Nondisplaced left lateral malleolar fracture.   3.  There is no significant osteoarthrosis.    Patient Stated Goals Pt wants to be able to walk normally again without pain and with normalized gait pattern and return to previous level of function.    Currently in Pain? Yes    Pain Score 4     Pain Location Back    Pain Descriptors / Indicators Aching    Pain Type Chronic pain    Pain Onset More than a month ago    Pain Frequency Constant    Aggravating Factors  laying flat, prolonged standing    Pain Relieving Factors rest/movement    Effect of Pain on Daily Activities decreased activity tolerance;    Multiple Pain Sites Yes    Pain Score 4    Pain Location Leg    Pain Orientation Left    Pain Descriptors / Indicators Aching;Sore    Pain Type Chronic pain    Pain Onset More than a month ago    Pain Frequency Intermittent    Aggravating Factors  worse with standing/walking/weight bearing    Pain Relieving Factors rest    Effect of Pain on Daily Activities decreased activity tolerance;                    Exercise/Activity Sets x Reps Resistance Assistance  Comments              Rocker board anterior/posterior rocking 1 x 1 min    No UE assist Tolerated fair, does report discomfort in left ankle/arch; mild posterior hip discomfort; min VCs for core stabilization;     Rocker Board lateral rocking  1 x 1 min    No UE assist Tolerated fair, less discomfort reported;     Manual therapy: stretching and grade 2/3 joint mobs, flexion of toes on L LE (primarily first and second digits)  X 7 min        -Improvement  in first and second digit range of motion this session compared to previous sessions  Manual therapy: ankle DF stretch  2 x X 45 sec      - stretch felt in area of gastrocnemius muscle, reports less discomfort following joint mobilizations;   BAPS board ant/post,   X10 reps     - level 3 Standing position holding onto parallel bars: Reports moderate to severe pain in back and hips/left knee;    Toe splaying with focus on great toe - seated        Able to do partial ROM, required assistance for full range of motion;   Walking forward/backward in // bars with no UE support X 5 laps through total      -improved gait pattern with increased hip extension and improved toe off noted         Seated posterior pelvic tilt  5 sec hold x5 reps      Min VCs for proper positioning to help reduce back discomfort;    Seated:  LLE ankle isometric rhythmic stabilization of ankle (all directions) 2-3 sec hold x1 min total X2 sets     Required cues to avoid moving hip/knee and to keep ankle in neutral position to challenge stability; Reports moderate difficulty with IV/EV resistance;  LLE ankle circles clockwise/counterclockwise X10 reps each   Cues to avoid hip/knee movement;  Seated towel scrunches flexion and initiation of toe extension moving towel out from under foot X10 reps each   Required mod VCs for correct positioning/technique; Reports moderate difficulty initiate toe extension with soreness.     Patient tolerated session well. She does have intermittent pain with  various exercise but reports less soreness at rest.  PT applied moist heat to low back when seated on mat table for manual therapy/LE ankle exercise for better tolerance;  Patient's condition has the potential to improve in response to therapy. Maximum improvement is yet to be obtained. The anticipated improvement is attainable and reasonable in a generally predictable time.  Patient reports adherence with HEP and is motivated to improve mobility; Goals were recently updated on 12/21, see recent progress note;                 PT Education - 09/24/21 0806     Education Details Ex technique/positioning;    Person(s) Educated Patient    Methods Explanation;Verbal cues    Comprehension Verbalized understanding;Returned demonstration;Verbal cues required;Need further instruction              PT Short Term Goals - 09/24/21 0807       PT SHORT TERM GOAL #1   Title Patient will be independent in home exercise program to improve strength/mobility for better functional independence with ADLs.    Baseline 10/26: HEP given, Pt is independent and completes HEP regularly    Time 4    Period Weeks    Status Achieved    Target Date 08/08/21      PT SHORT TERM GOAL #2   Title Patient will report a worse VAS of 4/10 in LLE for improved tolerance of mobility and quality of life.    Baseline 4 of 10 in seated position but patient reports pain is up to 6-7 out of 10 at times. 09/05/2021= Patient reports pain level in ankle= 5/10 today- having more soreness due to increase use of ASO brace.    Time 4    Period Weeks    Status On-going    Target  Date 10/03/21               PT Long Term Goals - 09/24/21 0807       PT LONG TERM GOAL #1   Title Patient will increase FOTO score to equal to or greater than 47%    to demonstrate statistically significant improvement in mobility and quality of life.    Baseline 10/26: 5%, 12/1:26.38%; 12/21= 40%    Time 8    Period Weeks    Status  On-going    Target Date 10/31/21      PT LONG TERM GOAL #2   Title Patient will increase 10 meter walk test to >1.61ms as to improve gait speed for better community ambulation and to reduce fall risk.    Baseline 10/26: unable to ambulate at this time 12/1:.475m with RW,.4033mwith cane; 09/05/2021= 0.6 m/s using SPC    Time 8    Period Weeks    Status On-going    Target Date 09/27/21      PT LONG TERM GOAL #3   Title Patient will increase BLE gross strength to 4+/5 as to improve functional strength for independent gait, increased standing tolerance and increased ADL ability.    Baseline 10/26: see note, 12/1: Improved knee and hip strength to 4+/5 in seated position, ankle DF, EV and Inv still require strengthenigng, see flowsheets for more info; 12/22= Patient continues to present with 4+/5 Bilateral Hip flex/abd/knee ext/flex strength and improving ankle strength= 3+/5 ankle DF/PF/EV/IV.    Time 8    Period Weeks    Status Partially Met    Target Date 10/31/21      PT LONG TERM GOAL #4   Title Patient will tolerate weightbearing for >30 minutes for increased mobility and ambulation to increase indepenence with ADLs and iADLs    Baseline 10/26; able to tolerate approximately 15 minutes: 09/06/2021= Not directly tested in clinic today however patient and husband report that is she able to stand for 30 min at home.    Time 8    Period Weeks    Status Achieved      PT LONG TERM GOAL #5   Title Patient will increase ankle ROM to 10 degrees within normal range for improved gait mechanics and reduced pain.    Baseline 10/26: see note, improved will all ROM since initial eval, see note for specific details: 09/06/2021= ROM measured at last visit 12/19 - has improved to within 10 deg of normal.    Time 8    Period Weeks    Status Achieved      PT LONG TERM GOAL #6   Title Pt will decrease worst low back pain as reported on NPRS by at least 2 points in order to demonstrate clinically  significant reduction in back pain.    Baseline 08/08/2021= LBP at worst = 7/10; 09/05/2021= 5/10 current low back pain- Will keep goal active to ensure patient's pain consistently decreases.    Time 8    Period Weeks    Status On-going    Target Date 10/31/21      PT LONG TERM GOAL #7   Title Pt will decrease 5TSTS by at least 3 seconds in order to demonstrate clinically significant improvement in LE strength.    Baseline 09/06/2021= 21.45 sec without UE support    Time 8    Period Weeks    Status New    Target Date 10/31/21      PT LONG TERM  GOAL #8   Title Pt will improve BERG by at least 3 points in order to demonstrate clinically significant improvement in balance.    Baseline 09/05/2021= 50/56    Time 8    Period Weeks    Status New    Target Date 10/31/21                   Plan - 09/24/21 0948     Clinical Impression Statement Patient motivated and participated well within session. she was instructed in LE standing activities to facilitate better weight acceptance and ROM in LLE. She is able to do some movement unassisted with increased weight acceptance to LLE. She does report intermittent back/posterior hip discomfort with some standing exercise. She does require min VCs for proper exercise technique. PT performed stretches to LLE ankle to facilitate better ROM. Patient initially reports moderate discomfort but then reports less discomfort with subsequent repetition with better tolerance. Advanced HEP with toe flexion/extension exercise for better fine motor skills with foot. She would benefit from additional skilled PT Intervention to improve strength and mobility while reducing pain. Goals were recently updated on 12/21, see last progress note for updated outcome measures.    Personal Factors and Comorbidities Age;Comorbidity 3+;Past/Current Experience;Sex;Time since onset of injury/illness/exacerbation;Transportation    Comorbidities HLD, hypothyroidism, ostepenia,  osteoporis, SVT, clotting disorder    Examination-Activity Limitations Bathing;Bed Mobility;Bend;Caring for [employer];Locomotion Level;Lift;Hygiene/Grooming;Squat;Stairs;Stand;Toileting;Transfers    Examination-Participation Restrictions Church;Cleaning;Community Activity;Driving;Interpersonal Relationship;Personal Finances;Meal Prep;Laundry;Shop;Volunteer;Yard Work;Occupation    Stability/Clinical Decision Making Evolving/Moderate complexity    Rehab Potential Good    PT Frequency 2x / week    PT Duration 8 weeks    PT Treatment/Interventions ADLs/Self Care Home Management;Aquatic Therapy;Biofeedback;Cryotherapy;Electrical Stimulation;Iontophoresis 35m/ml Dexamethasone;Moist Heat;Ultrasound;DME Instruction;Gait training;Stair training;Functional mobility training;Neuromuscular re-education;Cognitive remediation;Balance training;Therapeutic exercise;Therapeutic activities;Patient/family education;Orthotic Fit/Training;Manual techniques;Compression bandaging;Manual lymph drainage;Scar mobilization;Passive range of motion;Energy conservation;Splinting;Taping;Vasopneumatic Device;Visual/perceptual remediation/compensation    PT Next Visit Plan WB activities, Gait and balance training as appropriate; Progressive LE strength and ROM activities. Review and progress Low back/knee gentle stretching.    PT Home Exercise Plan 09/12/2021=Access Code: [email or web address] URL: [email or web address]    Consulted and Agree with Plan of Care Patient             Patient will benefit from skilled therapeutic intervention in order to improve the following deficits and impairments:  Abnormal gait, Cardiopulmonary status limiting activity, Decreased activity tolerance, Decreased balance, Decreased knowledge of precautions, Decreased endurance, Decreased knowledge of use of DME, Decreased range of motion, Decreased mobility, Difficulty walking, Decreased strength, Hypomobility,  Increased edema, Impaired flexibility, Impaired perceived functional ability, Increased muscle spasms, Pain  Visit Diagnosis: Abnormality of gait and mobility  Difficulty in walking, not elsewhere classified  Muscle weakness (generalized)  Unsteadiness on feet  Other lack of coordination  Bilateral low back pain without sciatica, unspecified chronicity  Chronic bilateral low back pain without sciatica  Pain in left leg  Pain in left lower leg  Other abnormalities of gait and mobility     Problem List Patient Active Problem List   Diagnosis Date Noted   NSTEMI (non-ST elevated myocardial infarction) ([place] 07/14/2021   Pulmonary nodule less than 6 mm determined by computed tomography of lung 07/14/2021   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 tract infection 04/19/2021   Hepatic hemangioma 06/02/2020   Constipation 06/02/2020  Left ankle sprain 03/25/2018   Foot injury, left, initial encounter 03/25/2018   Osteoporosis of lumbar spine 02/19/2018   Subcutaneous nodule 09/16/2017   Pain and swelling of lower leg 09/04/2017   [place]ev[place] 09/04/2017   Pain of both hip joints 06/03/2017   Need for hepatitis C screening test 05/22/2017   SVT s/p ablation 01/09/2010    Addi Pak, PT, DPT 09/24/2021, 9:51 AM  [facility] [address] [place], [place], [address] Phone: [phone]   Fax:  [phone]  Name: JERRI GLAUSER MRN: [id number] Date of Birth: Aug 25, 1961

## 2021-09-27 ENCOUNTER — Other Ambulatory Visit: Payer: Self-pay

## 2021-09-27 ENCOUNTER — Ambulatory Visit: Payer: Managed Care, Other (non HMO)

## 2021-09-27 DIAGNOSIS — G8929 Other chronic pain: Secondary | ICD-10-CM

## 2021-09-27 DIAGNOSIS — M545 Low back pain, unspecified: Secondary | ICD-10-CM

## 2021-09-27 DIAGNOSIS — R269 Unspecified abnormalities of gait and mobility: Secondary | ICD-10-CM | POA: Diagnosis not present

## 2021-09-27 DIAGNOSIS — M6281 Muscle weakness (generalized): Secondary | ICD-10-CM

## 2021-09-27 DIAGNOSIS — M79605 Pain in left leg: Secondary | ICD-10-CM

## 2021-09-27 DIAGNOSIS — R2681 Unsteadiness on feet: Secondary | ICD-10-CM

## 2021-09-27 DIAGNOSIS — R262 Difficulty in walking, not elsewhere classified: Secondary | ICD-10-CM

## 2021-09-27 NOTE — Therapy (Signed)
[place] MAIN [facility] SERVICES [address] [place], [place], [address] Phone: [phone]   Fax:  [phone]  Physical Therapy Treatment  Patient Details  Name: Michelle Armstrong MRN: [id number] Date of Birth: 01-23-1961 Referring Provider (PT): Hildred Alamin PA   Encounter Date: 09/27/2021   PT End of Session - 09/27/21 0755     Visit Number 21    Number of Visits 32    Date for PT Re-Evaluation 10/31/21    Authorization Type Initial Eval 49/17/9150; Recert 56/97/9480-1/65/5374    Progress Note Due on Visit 30    PT Start Time 0800    PT Stop Time 0842    PT Time Calculation (min) 42 min    Equipment Utilized During Treatment Other (comment);Gait belt   SPC/ankle brace   Activity Tolerance Patient tolerated treatment well    Behavior During Therapy [facility] for tasks assessed/performed             Past Medical History:  Diagnosis Date   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 02/2010 Echo: EF 55-60%, no rwma.   Vitamin D deficiency    low    Past Surgical History:  Procedure Laterality Date   ABDOMINAL HYSTERECTOMY  1999   Total lap hyst BSO due to endometriosis   CARDIAC ELECTROPHYSIOLOGY MAPPING AND ABLATION  2011   COLONOSCOPY WITH PROPOFOL N/A 07/10/2015   Procedure: COLONOSCOPY WITH PROPOFOL;  Surgeon: Lollie Sails, MD;  Location: [facility] ENDOSCOPY;  Service: Endoscopy;  Laterality: N/A;   GALLBLADDER SURGERY     HERNIA REPAIR  12/2010   LEFT  HEART CATH AND CORONARY ANGIOGRAPHY N/A 02/24/2017   Procedure: Left Heart Cath and Coronary Angiography;  Surgeon: Wellington Hampshire, MD;  Location: [facility];  Service: Cardiovascular;  Laterality: N/A;   TONSILLECTOMY      There were no vitals filed for this visit.   Subjective Assessment - 09/27/21 0752     Subjective Patient reports continuing to walk some without her cane at home and reports very determined to keep pushing herself to do whatever she has to do to get better.    Patient is accompained by: Family member    Pertinent History 61 yo Female s/p fall with subsequent closed non-displaced segmental fracture of left fibula on 05/17/21. Pt reports she sustained her initial injury when she was leaving a resturaunt. She reprots she was stepping onto an outdoor carpeted surface which had a unmarked, large indentation beneath the carpet which could not be noticed. When she stepped into this area, her ankle turned and resulted in the fracture and fall. She was casted for 6 weeks and has now been given a walking boot which she will wear for 6 weeks. MD order states, May gradually advance weight bearing as tolerated in the boot. Once full weightbearing, may wean out of boot She also  suffered an L1 compression fracture (25% loss of height). She reports orthopedic MD has not evaluated her ankle. She has a referral to [facility] to evaluate ankle later today. 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 As far as she is aware she has not had a clot at this point. She also has osteopenia. Pt is unable to don/doff boot independently due to difficulty bending over, husband helps her; Pt reports increased back pain with difficulty getting in/out of bed and has since been sleeping and living in a lift chair. PMH significant: Clotting disorder, HLD, hypothyroidism, Osteopenia, SVT, vitamin D deficiency    Limitations  Sitting;Lifting;Standing;Walking;House hold activities    Diagnostic tests 10/2: MRI of lumbar spine: Evolving subacute compression fracture involving the superior  endplate of L1 with mild 25% height loss and trace 2 mm bony  retropulsion. No associated stenosis.  2. Small left foraminal disc protrusion with annular fissure at  L4-5, closely approximating and potentially irritating the exiting  left L4 nerve root.  3. Mild-to-moderate facet hypertrophy at L3-4 through L5-S1. X-ray of LLE: 1.  Mildly displaced oblique extra-articular fracture of the proximal left fibular diaphysis.   2.  Nondisplaced left lateral malleolar fracture.   3.  There is no significant osteoarthrosis.    Patient Stated Goals Pt wants to be able to walk normally again without pain and with normalized gait pattern and return to previous level of function.    Currently in Pain? Yes    Pain Score --   DId not rate- but does endorse pain in back/hip, knee and ankle   Pain Onset More than a month ago    Pain Onset More than a month ago                INTERVENTIONS:    Therapeutic Exercises:   Instructed in use of Nustep at seat level 9 (LE only) - at level 0 for 5 min- Patient able to complete but did endorse increased back pain symptoms    Resistive Gait: (matrix cable system) Forward x 3 at 7.5 lb Backward x 3 at 7.5 lb.  *VC for technique- Patient responded well and able to walk with reciprocal steps.   Sit to stand from edge of mat 3 reps feet equal 3 reps right LE extended in front 3 reps left LE extended in front *Patient successful in all attempts yet did report pain. VC for core activation.   Gait without a cane- Approx 160 feet- Patient able to exhibit reciprocal steps with improving overall heel to toe gait sequencing with no cues today.   prostretch - Instruction to hold for 20-30 sec x 4 sets. Patient reported she could feel a good calf stretch however did experience knee pain/LBP. Patient was  able to work within pain tolerance.   (L) Hip flexor stretch- Hold 20 sec x 4 on left. VC to keep left LE extended behind right for desired stretch.   Hip abd up and over  orange hurdle- Good ability to clear both feet over hurdle with improving wide side step with practice.   Tandem stance- Instruction to hold 20-30 (practicing with each LE as back leg) - Initial unsteadiness yet much improved with several trials today.    SLS - Attempting to stand on one Leg- Instruction to hold 10 sec and patient able to perform without any significant difficulty on left LE  Lumbar flex into Ext- At //  bars- Start in bent over position and ext up with arms raises -10 reps. Patient able to follow all VC and work within pain limits.   Access Code: [email or web address] URL: [email or web address] Date: 09/27/2021 Prepared by: Sande Brothers  Exercises Tandem Stance - 1 x daily - 7 x weekly - 3 sets - 20-30 sec hold Single Leg Stance - 1 x daily - 7 x weekly - 3 sets - 10-20 hold             PT Education - 09/27/21 0753     Education Details Exercise technique- Issued tandem and SLS balance exercises for HEP Today.    Person(s) Educated Patient    Methods Explanation;Tactile cues;Demonstration;Verbal cues    Comprehension Verbalized understanding;Returned demonstration;Verbal cues required;Tactile cues required;Need further instruction              PT Short Term Goals - 09/24/21 0807       PT SHORT TERM GOAL #1   Title Patient will be independent in home exercise program to improve strength/mobility for better functional independence with ADLs.    Baseline 10/26: HEP given, Pt is independent and completes HEP regularly    Time 4    Period Weeks    Status Achieved    Target Date 08/08/21      PT SHORT TERM GOAL #2   Title Patient will report a worse VAS of 4/10 in LLE for improved tolerance of mobility and quality of life.    Baseline 4 of 10 in seated position but patient  reports pain is up to 6-7 out of 10 at times. 09/05/2021= Patient reports pain level in ankle= 5/10 today- having more soreness due to increase use of ASO brace.    Time 4    Period Weeks    Status On-going    Target Date 10/03/21               PT Long Term Goals - 09/24/21 0807       PT LONG TERM GOAL #1   Title Patient will increase FOTO score to equal to or greater than 47%    to demonstrate statistically significant improvement in mobility and quality of life.    Baseline 10/26: 5%, 12/1:26.38%; 12/21= 40%    Time 8    Period Weeks    Status On-going    Target Date 10/31/21      PT LONG TERM GOAL #2   Title Patient will increase 10 meter walk test to >1.77ms as to improve gait speed for better community ambulation and to reduce fall risk.    Baseline 10/26: unable to ambulate at this time 12/1:.453m with RW,.4045mwith cane; 09/05/2021= 0.6 m/s using SPC    Time 8    Period Weeks    Status On-going    Target Date 09/27/21      PT LONG TERM GOAL #3   Title Patient will increase BLE gross strength to 4+/5 as to improve functional strength for independent gait, increased standing tolerance and increased ADL ability.    Baseline 10/26: see note, 12/1: Improved knee and hip strength to 4+/5 in seated position, ankle DF, EV and Inv still require strengthenigng, see flowsheets for more info; 12/22= Patient continues to present with 4+/5 Bilateral Hip flex/abd/knee ext/flex strength and improving ankle strength= 3+/5 ankle DF/PF/EV/IV.    Time 8    Period Weeks    Status Partially Met    Target Date 10/31/21      PT LONG  TERM GOAL #4   Title Patient will tolerate weightbearing for >30 minutes for increased mobility and ambulation to increase indepenence with ADLs and iADLs    Baseline 10/26; able to tolerate approximately 15 minutes: 09/06/2021= Not directly tested in clinic today however patient and husband report that is she able to stand for 30 min at home.    Time 8     Period Weeks    Status Achieved      PT LONG TERM GOAL #5   Title Patient will increase ankle ROM to 10 degrees within normal range for improved gait mechanics and reduced pain.    Baseline 10/26: see note, improved will all ROM since initial eval, see note for specific details: 09/06/2021= ROM measured at last visit 12/19 - has improved to within 10 deg of normal.    Time 8    Period Weeks    Status Achieved      PT LONG TERM GOAL #6   Title Pt will decrease worst low back pain as reported on NPRS by at least 2 points in order to demonstrate clinically significant reduction in back pain.    Baseline 08/08/2021= LBP at worst = 7/10; 09/05/2021= 5/10 current low back pain- Will keep goal active to ensure patient's pain consistently decreases.    Time 8    Period Weeks    Status On-going    Target Date 10/31/21      PT LONG TERM GOAL #7   Title Pt will decrease 5TSTS by at least 3 seconds in order to demonstrate clinically significant improvement in LE strength.    Baseline 09/06/2021= 21.45 sec without UE support    Time 8    Period Weeks    Status New    Target Date 10/31/21      PT LONG TERM GOAL #8   Title Pt will improve BERG by at least 3 points in order to demonstrate clinically significant improvement in balance.    Baseline 09/05/2021= 50/56    Time 8    Period Weeks    Status New    Target Date 10/31/21                   Plan - 09/27/21 0755     Clinical Impression Statement Patient continues to present to clinic with excellent motivation and pushes herself to try and perform each prescribed activity to improve her condition. She was able to demo good gait sequencing without an AD in gym/hallway today and able to progress with static balance activities that were added to her home program today. Patient will continue to benefit from skilled physical therapy intervention in order to improve her lower extremity strength, gait pattern, ambulatory capacity, pain, as  well as to improve her overall quality of life.    Personal Factors and Comorbidities Age;Comorbidity 3+;Past/Current Experience;Sex;Time since onset of injury/illness/exacerbation;Transportation    Comorbidities HLD, hypothyroidism, ostepenia, osteoporis, SVT, clotting disorder    Examination-Activity Limitations Bathing;Bed Mobility;Bend;Caring for [employer];Locomotion Level;Lift;Hygiene/Grooming;Squat;Stairs;Stand;Toileting;Transfers    Examination-Participation Restrictions Church;Cleaning;Community Activity;Driving;Interpersonal Relationship;Personal Finances;Meal Prep;Laundry;Shop;Volunteer;Yard Work;Occupation    Stability/Clinical Decision Making Evolving/Moderate complexity    Rehab Potential Good    PT Frequency 2x / week    PT Duration 8 weeks    PT Treatment/Interventions ADLs/Self Care Home Management;Aquatic Therapy;Biofeedback;Cryotherapy;Electrical Stimulation;Iontophoresis 5m/ml Dexamethasone;Moist Heat;Ultrasound;DME Instruction;Gait training;Stair training;Functional mobility training;Neuromuscular re-education;Cognitive remediation;Balance training;Therapeutic exercise;Therapeutic activities;Patient/family education;Orthotic Fit/Training;Manual techniques;Compression bandaging;Manual lymph drainage;Scar mobilization;Passive range of motion;Energy conservation;Splinting;Taping;Vasopneumatic Device;Visual/perceptual remediation/compensation    PT Next Visit Plan WB  activities, Gait and balance training as appropriate; Progressive LE strength and ROM activities. Progress Low back/knee gentle stretching.    PT Home Exercise Plan 09/12/2021=Access Code: [email or web address]  URL: [email or web address]    Consulted and Agree with Plan of Care Patient             Patient will benefit from skilled therapeutic intervention in order to improve the following deficits and impairments:  Abnormal gait, Cardiopulmonary status limiting activity,  Decreased activity tolerance, Decreased balance, Decreased knowledge of precautions, Decreased endurance, Decreased knowledge of use of DME, Decreased range of motion, Decreased mobility, Difficulty walking, Decreased strength, Hypomobility, Increased edema, Impaired flexibility, Impaired perceived functional ability, Increased muscle spasms, Pain  Visit Diagnosis: Abnormality of gait and mobility  Difficulty in walking, not elsewhere classified  Muscle weakness (generalized)  Unsteadiness on feet  Chronic left-sided low back pain without sciatica  Pain in left leg     Problem List Patient Active Problem List   Diagnosis Date Noted   NSTEMI (non-ST elevated myocardial infarction) ([place]) 07/14/2021   Pulmonary nodule less than 6 mm determined by computed tomography of lung 07/14/2021   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 tract infection 04/19/2021   Hepatic hemangioma 06/02/2020   Constipation 06/02/2020   Left ankle sprain 03/25/2018   Foot injury, left, initial encounter 03/25/2018   Osteoporosis of lumbar spine 02/19/2018   Subcutaneous nodule 09/16/2017   Pain and swelling of lower leg 09/04/2017   [place] 09/04/2017   Pain of both hip joints 06/03/2017   Need for hepatitis C screening test 05/22/2017   SVT s/p ablation 01/09/2010    Lewis Moccasin, PT 09/28/2021, 11:20 AM  [place] [address] [place], [place], [address] Phone: [phone]   Fax:  [phone]  Name: Michelle Armstrong MRN: [id number] Date of Birth: [birth year]

## 2021-10-01 ENCOUNTER — Other Ambulatory Visit: Payer: Self-pay

## 2021-10-01 ENCOUNTER — Ambulatory Visit: Payer: Managed Care, Other (non HMO) | Admitting: Physical Therapy

## 2021-10-01 DIAGNOSIS — R2681 Unsteadiness on feet: Secondary | ICD-10-CM

## 2021-10-01 DIAGNOSIS — R262 Difficulty in walking, not elsewhere classified: Secondary | ICD-10-CM

## 2021-10-01 DIAGNOSIS — R269 Unspecified abnormalities of gait and mobility: Secondary | ICD-10-CM | POA: Diagnosis not present

## 2021-10-01 DIAGNOSIS — M545 Low back pain, unspecified: Secondary | ICD-10-CM

## 2021-10-01 NOTE — Therapy (Signed)
[place] MAIN [facility] SERVICES [address] [place], [place], [address] Phone: [phone]   Fax:  [phone]  Physical Therapy Treatment  Patient Details  Name: Michelle Armstrong MRN: [id number] Date of Birth: 1960/10/25 Referring Provider (PT): Hildred Alamin PA   Encounter Date: 10/01/2021   PT End of Session - 10/01/21 0903     Visit Number 22    Number of Visits 32    Date for PT Re-Evaluation 10/31/21    Authorization Type Initial Eval 69/62/9528; Recert 41/32/4401-0/27/2536    Progress Note Due on Visit 30    PT Start Time 0800    PT Stop Time 0851    PT Time Calculation (min) 51 min    Equipment Utilized During Treatment Other (comment);Gait belt   SPC/ankle brace   Activity Tolerance Patient tolerated treatment well    Behavior During Therapy [facility] for tasks assessed/performed             Past Medical History:  Diagnosis Date   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 02/2010 Echo: EF 55-60%, no rwma.   Vitamin D deficiency    low    Past Surgical History:  Procedure Laterality Date   ABDOMINAL HYSTERECTOMY  1999   Total lap hyst BSO due to endometriosis   CARDIAC ELECTROPHYSIOLOGY MAPPING AND ABLATION  2011   COLONOSCOPY WITH PROPOFOL N/A 07/10/2015   Procedure: COLONOSCOPY WITH PROPOFOL;  Surgeon: Lollie Sails, MD;  Location: [facility] ENDOSCOPY;  Service: Endoscopy;  Laterality: N/A;   GALLBLADDER SURGERY     HERNIA REPAIR  12/2010   LEFT  HEART CATH AND CORONARY ANGIOGRAPHY N/A 02/24/2017   Procedure: Left Heart Cath and Coronary Angiography;  Surgeon: Wellington Hampshire, MD;  Location: [place] CV LAB;  Service: Cardiovascular;  Laterality: N/A;   TONSILLECTOMY      There were no vitals filed for this visit.   Subjective Assessment - 10/01/21 0804     Subjective Patient reports continuing to walk at home without the aid of her cane but she feels safer to have the cane in hand and her levels of pain are similar with using the cane versus without using the cane.    Patient is accompained by: Family member    Pertinent History 61 yo Female s/p fall with subsequent closed non-displaced segmental fracture of left fibula on 05/17/21. Pt reports she sustained her initial injury when she was leaving a resturaunt. She reprots she was stepping onto an outdoor carpeted surface which had a unmarked, large indentation beneath the carpet which could not be noticed. When she stepped into this area, her ankle turned and resulted in the fracture and fall. She was casted for 6 weeks and has now been given a walking boot which she will wear for 6 weeks. MD order states, May gradually advance weight bearing as tolerated in the boot.  Once full weightbearing, may wean out of boot She also suffered an L1 compression fracture (25% loss of height). She reports orthopedic MD has not evaluated her ankle. She has a referral to [facility] to evaluate ankle later today. 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 As far as she is aware she has not had a clot at this point. She also has osteopenia. Pt is unable to don/doff boot independently due to difficulty bending over, husband helps her; Pt reports increased back pain with difficulty getting in/out of bed and has since been sleeping and living in a lift chair. PMH significant: Clotting disorder, HLD, hypothyroidism, Osteopenia, SVT, vitamin D deficiency     Limitations Sitting;Lifting;Standing;Walking;House hold activities    Diagnostic tests 10/2: MRI of lumbar spine: Evolving subacute compression fracture involving the superior  endplate of L1 with mild 25% height loss and trace 2 mm bony  retropulsion. No associated stenosis.  2. Small left foraminal disc protrusion with annular fissure at  L4-5, closely approximating and potentially irritating the exiting  left L4 nerve root.  3. Mild-to-moderate facet hypertrophy at L3-4 through L5-S1. X-ray of LLE: 1.  Mildly displaced oblique extra-articular fracture of the proximal left fibular diaphysis.   2.  Nondisplaced left lateral malleolar fracture.   3.  There is no significant osteoarthrosis.    Patient Stated Goals Pt wants to be able to walk normally again without pain and with normalized gait pattern and return to previous level of function.    Pain Onset More than a month ago    Pain Onset More than a month ago             INTERVENTIONS:    Therapeutic Exercises:   Nustep at seat level 9 (LE only) - at level 0 for 5 min- No charge     Resistive Gait: (matrix cable system) Forward x 2 at 7.5 lb, x 1 at 12.5#  Backward x 3 at 7.5 # *VC for technique- Patient responded well and able to walk with reciprocal steps.  SLS trials 1 x 30 sec on ea LE - pain reported with SLS on the L LE -Progressed to slow marching with min UE support to improve pain with L SLS x 10 on ea LE   BAPS board with L4 ball and with other ball on top peg at each position for resistance  - x 45 sec clockwise and counter clockwise with ball on each peg position   Sit to stand from edge of mat 3 reps feet equal 3 reps right LE extended in front 3 reps on airex pad with feet equal 3 reps on airex pad with R LE in front *Patient successful in all attempts yet did report some discomfort    Hip flexor stretch- Hold 45 sec x  on left. 45 sec x 1 on R.   Lying lying supine attempt - bolster under knees and  immediately following bilateral hip flexor stretching  -able to tolerate Head of bed (HOB) elevated to 18 degrees before increasing levels of pain limited her from further decreasing HOB height.  -will continue to progress with this next session with some stretching and assessment of level of discomfort.  -used mechanically adjustable table to incrementally decrease HOB angle.                              PT  Education - 10/01/21 0902     Education Details Exercise technique, hip flexor anatomy    Person(s) Educated Patient    Methods Explanation    Comprehension Verbalized understanding;Returned demonstration;Verbal cues required;Tactile cues required              PT Short Term Goals - 09/24/21 0807       PT SHORT TERM GOAL #1   Title Patient will be independent in home exercise program to improve strength/mobility for better functional independence with ADLs.    Baseline 10/26: HEP given, Pt is independent and completes HEP regularly    Time 4    Period Weeks    Status Achieved    Target Date 08/08/21      PT SHORT TERM GOAL #2   Title Patient will report a worse VAS of 4/10 in LLE for improved tolerance of mobility and quality of life.    Baseline 4 of 10 in seated position but patient reports pain is up to 6-7 out of 10 at times. 09/05/2021= Patient reports pain level in ankle= 5/10 today- having more soreness due to increase use of ASO brace.    Time 4    Period Weeks    Status On-going    Target Date 10/03/21               PT Long Term Goals - 09/24/21 0807       PT LONG TERM GOAL #1   Title Patient will increase FOTO score to equal to or greater than 47%    to demonstrate statistically significant improvement in mobility and quality of life.    Baseline 10/26: 5%, 12/1:26.38%; 12/21= 40%    Time 8    Period Weeks    Status On-going    Target Date 10/31/21      PT LONG TERM GOAL #2   Title Patient will increase 10 meter walk test  to >1.42ms as to improve gait speed for better community ambulation and to reduce fall risk.    Baseline 10/26: unable to ambulate at this time 12/1:.47m with RW,.4018mwith cane; 09/05/2021= 0.6 m/s using SPC    Time 8    Period Weeks    Status On-going    Target Date 09/27/21      PT LONG TERM GOAL #3   Title Patient will increase BLE gross strength to 4+/5 as to improve functional strength for independent gait, increased standing tolerance and increased ADL ability.    Baseline 10/26: see note, 12/1: Improved knee and hip strength to 4+/5 in seated position, ankle DF, EV and Inv still require strengthenigng, see flowsheets for more info; 12/22= Patient continues to present with 4+/5 Bilateral Hip flex/abd/knee ext/flex strength and improving ankle strength= 3+/5 ankle DF/PF/EV/IV.    Time 8    Period Weeks    Status Partially Met    Target Date 10/31/21      PT LONG TERM GOAL #4   Title Patient will tolerate weightbearing for >30 minutes for increased mobility and ambulation to increase indepenence with ADLs and iADLs    Baseline 10/26; able to tolerate approximately 15 minutes: 09/06/2021= Not directly tested in clinic today however patient and husband report that is she able to stand for 30 min at home.    Time 8    Period Weeks    Status Achieved      PT LONG TERM GOAL #5   Title Patient will increase ankle ROM to 10 degrees within normal  range for improved gait mechanics and reduced pain.    Baseline 10/26: see note, improved will all ROM since initial eval, see note for specific details: 09/06/2021= ROM measured at last visit 12/19 - has improved to within 10 deg of normal.    Time 8    Period Weeks    Status Achieved      PT LONG TERM GOAL #6   Title Pt will decrease worst low back pain as reported on NPRS by at least 2 points in order to demonstrate clinically significant reduction in back pain.    Baseline 08/08/2021= LBP at worst = 7/10; 09/05/2021= 5/10 current low back  pain- Will keep goal active to ensure patient's pain consistently decreases.    Time 8    Period Weeks    Status On-going    Target Date 10/31/21      PT LONG TERM GOAL #7   Title Pt will decrease 5TSTS by at least 3 seconds in order to demonstrate clinically significant improvement in LE strength.    Baseline 09/06/2021= 21.45 sec without UE support    Time 8    Period Weeks    Status New    Target Date 10/31/21      PT LONG TERM GOAL #8   Title Pt will improve BERG by at least 3 points in order to demonstrate clinically significant improvement in balance.    Baseline 09/05/2021= 50/56    Time 8    Period Weeks    Status New    Target Date 10/31/21                   Plan - 10/01/21 6387     Clinical Impression Statement Pt presents with excellent motivation to improve with all of her physical therapy related exercises. Pt still having difficulty with laying supine and has increased levels of back and hip pain with this positioning. Pt able to lie supine with head of mat elevated to 18 degrees and had some pain in medial scapular border, lumbar, thoracic spine and her hips with this positioning with bolster under her knees. Will continue to monitor response to various levels of head of bed elevation. Patient will continue to benefit from skilled physical therapy intervention in order to improve her lower extremity strength, gait pattern, ambulatory capacity, pain, as well as to improve her overall quality of life.    Personal Factors and Comorbidities Age;Comorbidity 3+;Past/Current Experience;Sex;Time since onset of injury/illness/exacerbation;Transportation    Comorbidities HLD, hypothyroidism, ostepenia, osteoporis, SVT, clotting disorder    Examination-Activity Limitations Bathing;Bed Mobility;Bend;Caring for [employer];Locomotion Level;Lift;Hygiene/Grooming;Squat;Stairs;Stand;Toileting;Transfers    Examination-Participation Restrictions  Church;Cleaning;Community Activity;Driving;Interpersonal Relationship;Personal Finances;Meal Prep;Laundry;Shop;Volunteer;Yard Work;Occupation    Stability/Clinical Decision Making Evolving/Moderate complexity    Rehab Potential Good    PT Frequency 2x / week    PT Duration 8 weeks    PT Treatment/Interventions ADLs/Self Care Home Management;Aquatic Therapy;Biofeedback;Cryotherapy;Electrical Stimulation;Iontophoresis 26m/ml Dexamethasone;Moist Heat;Ultrasound;DME Instruction;Gait training;Stair training;Functional mobility training;Neuromuscular re-education;Cognitive remediation;Balance training;Therapeutic exercise;Therapeutic activities;Patient/family education;Orthotic Fit/Training;Manual techniques;Compression bandaging;Manual lymph drainage;Scar mobilization;Passive range of motion;Energy conservation;Splinting;Taping;Vasopneumatic Device;Visual/perceptual remediation/compensation    PT Next Visit Plan WB activities, Gait and balance training as appropriate; Progressive LE strength and ROM activities. Progress Low back/knee gentle stretching.    PT Home Exercise Plan 09/12/2021=Access Code: [email or web address] URL: [email or web address]    Consulted and Agree with Plan of Care Patient             Patient will benefit from skilled therapeutic intervention in order to improve the following deficits  and impairments:  Abnormal gait, Cardiopulmonary status limiting activity, Decreased activity tolerance, Decreased balance, Decreased knowledge of precautions, Decreased endurance, Decreased knowledge of use of DME, Decreased range of motion, Decreased mobility, Difficulty walking, Decreased strength, Hypomobility, Increased edema, Impaired flexibility, Impaired perceived functional ability, Increased muscle spasms, Pain  Visit Diagnosis: Abnormality of gait and mobility  Difficulty in walking, not elsewhere classified  Unsteadiness on feet  Bilateral low back pain without sciatica,  unspecified chronicity     Problem List Patient Active Problem List   Diagnosis Date Noted   NSTEMI (non-ST elevated myocardial infarction) ([place]) 07/14/2021   Pulmonary nodule less than 6 mm determined by computed tomography of lung 07/14/2021   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 tract infection 04/19/2021   Hepatic hemangioma 06/02/2020   Constipation 06/02/2020   Left ankle sprain 03/25/2018   Foot injury, left, initial encounter 03/25/2018   Osteoporosis of lumbar spine 02/19/2018   Subcutaneous nodule 09/16/2017   Pain and swelling of lower leg 09/04/2017   Nevus 09/04/2017   Pain of both hip joints 06/03/2017   Need for hepatitis C screening test 05/22/2017   SVT s/p ablation 01/09/2010    Particia Lather, PT 10/01/2021, 9:26 AM  [place] [address] [place], [place], [address] Phone: [phone]   Fax:  [phone]  Name: Michelle Armstrong MRN: [id number] Date of Birth: 29-Nov-1960

## 2021-10-03 ENCOUNTER — Other Ambulatory Visit: Payer: Self-pay

## 2021-10-03 ENCOUNTER — Ambulatory Visit: Payer: Managed Care, Other (non HMO) | Admitting: Physical Therapy

## 2021-10-03 DIAGNOSIS — R269 Unspecified abnormalities of gait and mobility: Secondary | ICD-10-CM

## 2021-10-03 DIAGNOSIS — R262 Difficulty in walking, not elsewhere classified: Secondary | ICD-10-CM

## 2021-10-03 DIAGNOSIS — M79662 Pain in left lower leg: Secondary | ICD-10-CM

## 2021-10-03 DIAGNOSIS — M545 Low back pain, unspecified: Secondary | ICD-10-CM

## 2021-10-03 NOTE — Therapy (Signed)
[place] MAIN [facility] SERVICES [address] [place], [place], [address] Phone: [phone]   Fax:  [phone]  Physical Therapy Treatment  Patient Details  Name: Michelle Armstrong MRN: [id number] Date of Birth: Sep 11, 1961 Referring Provider (PT): Hildred Alamin PA   Encounter Date: 10/03/2021   PT End of Session - 10/03/21 0856     Visit Number 23    Number of Visits 32    Date for PT Re-Evaluation 10/31/21    Authorization Type Initial Eval 47/42/5956; Recert [id number]/6433-2/95/1884    Progress Note Due on Visit 30    PT Start Time 0800    PT Stop Time 0846    PT Time Calculation (min) 46 min    Equipment Utilized During Treatment Other (comment);Gait belt   SPC/ankle brace   Activity Tolerance Patient tolerated treatment well    Behavior During Therapy [facility] for tasks assessed/performed             Past Medical History:  Diagnosis Date   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 02/2010 Echo: EF 55-60%, no rwma.   Vitamin D deficiency    low    Past Surgical History:  Procedure Laterality Date   ABDOMINAL HYSTERECTOMY  1999   Total lap hyst BSO due to endometriosis   CARDIAC ELECTROPHYSIOLOGY MAPPING AND ABLATION  2011   COLONOSCOPY WITH PROPOFOL N/A 07/10/2015   Procedure: COLONOSCOPY WITH PROPOFOL;  Surgeon: Lollie Sails, MD;  Location: [facility] ENDOSCOPY;  Service: Endoscopy;  Laterality: N/A;   GALLBLADDER SURGERY     HERNIA REPAIR  12/2010   LEFT  HEART CATH AND CORONARY ANGIOGRAPHY N/A 02/24/2017   Procedure: Left Heart Cath and Coronary Angiography;  Surgeon: Wellington Hampshire, MD;  Location: [place] CV LAB;  Service: Cardiovascular;  Laterality: N/A;   TONSILLECTOMY      There were no vitals filed for this visit.   Subjective Assessment - 10/03/21 0853     Subjective Patient reports continued pain and discomfort with lying supine. Reports she has had some improvement when lying with her lower extermities in the extended position in comparison to having prop under her knees although this position is stil uncomfortable. Pt reports continued pain and discomfort following standing BAPS bard activities last week in therapy.    Patient is accompained by: Family member    Pertinent History 61 yo Female s/p fall with subsequent closed non-displaced segmental fracture of left fibula on 05/17/21. Pt reports she sustained her initial injury when she was leaving a resturaunt. She reprots she was stepping onto an outdoor carpeted surface which had a unmarked, large indentation beneath the carpet which could not be noticed. When she stepped into this area, her ankle turned and resulted in the fracture and fall. She was casted for 6 weeks and has now been given a walking boot which she will wear for  6 weeks. MD order states, May gradually advance weight bearing as tolerated in the boot. Once full weightbearing, may wean out of boot She also suffered an L1 compression fracture (25% loss of height). She reports orthopedic MD has not evaluated her ankle. She has a referral to [facility] to evaluate ankle later today. 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 As far as she is aware she has not had a clot at this point. She also has osteopenia. Pt is unable to don/doff boot independently due to difficulty bending over, husband helps her; Pt reports increased back pain with difficulty getting in/out of bed  and has since been sleeping and living in a lift chair. PMH significant: Clotting disorder, HLD, hypothyroidism, Osteopenia, SVT, vitamin D deficiency    Limitations Sitting;Lifting;Standing;Walking;House hold activities    Diagnostic tests 10/2: MRI of lumbar spine: Evolving subacute compression fracture involving the superior  endplate of L1 with mild 25% height loss and trace 2 mm bony  retropulsion. No associated stenosis.  2. Small left foraminal disc protrusion with annular fissure at  L4-5, closely approximating and potentially irritating the exiting  left L4 nerve root.  3. Mild-to-moderate facet hypertrophy at L3-4 through L5-S1. X-ray of LLE: 1.  Mildly displaced oblique extra-articular fracture of the proximal left fibular diaphysis.   2.  Nondisplaced left lateral malleolar fracture.   3.  There is no significant osteoarthrosis.    Patient Stated Goals Pt wants to be able to walk normally again without pain and with normalized gait pattern and return to previous level of function.    Pain Onset More than a month ago    Pain Onset More than a month ago                INTERVENTIONS:    Therapeutic Exercises:  Hip flexor stretch- Hold 45 sec x  on left. 45 sec x 1 on R.   Lying lying supine attempt - bolster under knees and immediately following bilateral hip flexor stretching  -able to tolerate Head of bed (HOB) elevated to decreased height today  -used mechanically adjustable table to incrementally decrease HOB angle.  Lumbar stabilization progression:  Bridges x 5 Bridges with 5 second perturbation x 5  -pt has some nausea with this positioning.   BAPS board with L4 ball and with other ball on top peg at each position for resistance  - x 45 sec clockwise and counter clockwise with ball on each peg position   Resistive Gait: (matrix cable system) Forward x 3 at 7.5 lb- c/o nausea with anterior walking, pt had reports of nausea following a meal she ate last night. Nausea  was monitored throughout session for worsening s/s   Backward x 3 at 7.5 # *VC for technique- Patient responded well and able to walk with reciprocal steps.   The following activities were completed in parallel bars or at balance bar  Airex balance course (airex pad ->airex balance beam -> airex pad -progressed to no UE support, good response with only minor loss of balance on last repetition (4 laps total)   Step ups to 4 inch step with knee drive -no UE support x 10  -some knee pain reported   Pt instructed in progressing with lying in modified supine position (similar to lift chair angle) in bed for 5-10 at a time as her pain and muscle spasms allow.  PT Education - 10/03/21 0856     Education Details Exercise technique    Person(s) Educated Patient    Methods Explanation;Demonstration    Comprehension Returned demonstration              PT Short Term Goals - 09/24/21 0807       PT SHORT TERM GOAL #1   Title Patient will be independent in home exercise program to improve strength/mobility for better functional independence with ADLs.    Baseline 10/26: HEP given, Pt is independent and completes HEP regularly    Time 4    Period Weeks    Status Achieved    Target Date 08/08/21      PT SHORT TERM GOAL #2   Title Patient will report a worse VAS of 4/10 in LLE for improved tolerance of mobility and quality of life.    Baseline 4 of 10 in seated position but patient reports pain is up to 6-7 out of 10 at times. 09/05/2021= Patient reports pain level in ankle= 5/10 today- having more soreness due to increase use of ASO brace.    Time 4    Period Weeks    Status On-going    Target Date 10/03/21               PT Long Term Goals - 09/24/21 0807       PT LONG TERM GOAL #1   Title Patient will increase FOTO score to equal to or greater than 47%    to demonstrate statistically significant improvement in mobility and quality of  life.    Baseline 10/26: 5%, 12/1:26.38%; 12/21= 40%    Time 8    Period Weeks    Status On-going    Target Date 10/31/21      PT LONG TERM GOAL #2   Title Patient will increase 10 meter walk test to >1.50ms as to improve gait speed for better community ambulation and to reduce fall risk.    Baseline 10/26: unable to ambulate at this time 12/1:.435m with RW,.409mwith cane; 09/05/2021= 0.6 m/s using SPC    Time 8    Period Weeks    Status On-going    Target Date 09/27/21      PT LONG TERM GOAL #3   Title Patient will increase BLE gross strength to 4+/5 as to improve functional strength for independent gait, increased standing tolerance and increased ADL ability.    Baseline 10/26: see note, 12/1: Improved knee and hip strength to 4+/5 in seated position, ankle DF, EV and Inv still require strengthenigng, see flowsheets for more info; 12/22= Patient continues to present with 4+/5 Bilateral Hip flex/abd/knee ext/flex strength and improving ankle strength= 3+/5 ankle DF/PF/EV/IV.    Time 8    Period Weeks    Status Partially Met    Target Date 10/31/21      PT LONG TERM GOAL #4   Title Patient will tolerate weightbearing for >30 minutes for increased mobility and ambulation to increase indepenence with ADLs and iADLs    Baseline 10/26; able to tolerate approximately 15 minutes: 09/06/2021= Not directly tested in clinic today however patient and husband report that is she able to stand for 30 min at home.    Time 8    Period Weeks    Status Achieved      PT LONG TERM GOAL #5   Title Patient will increase ankle ROM to 10 degrees within normal range for improved gait mechanics and reduced  pain.    Baseline 10/26: see note, improved will all ROM since initial eval, see note for specific details: 09/06/2021= ROM measured at last visit 12/19 - has improved to within 10 deg of normal.    Time 8    Period Weeks    Status Achieved      PT LONG TERM GOAL #6   Title Pt will decrease worst  low back pain as reported on NPRS by at least 2 points in order to demonstrate clinically significant reduction in back pain.    Baseline 08/08/2021= LBP at worst = 7/10; 09/05/2021= 5/10 current low back pain- Will keep goal active to ensure patient's pain consistently decreases.    Time 8    Period Weeks    Status On-going    Target Date 10/31/21      PT LONG TERM GOAL #7   Title Pt will decrease 5TSTS by at least 3 seconds in order to demonstrate clinically significant improvement in LE strength.    Baseline 09/06/2021= 21.45 sec without UE support    Time 8    Period Weeks    Status New    Target Date 10/31/21      PT LONG TERM GOAL #8   Title Pt will improve BERG by at least 3 points in order to demonstrate clinically significant improvement in balance.    Baseline 09/05/2021= 50/56    Time 8    Period Weeks    Status New    Target Date 10/31/21                   Plan - 10/03/21 0857     Clinical Impression Statement Pt presents with excellent motivation to improve with all of her physical therapy related exercises. Pt still having difficulty with laying supine and has increased levels of back and hip pain with this positioning. Able to progress with some modified supine positioning today although pt still is very uncomfortable with this positioning. Pt instructed to gradually progress with this positioing at home for 5-10 minutes per day in her bed with some slight trunk elevation to mimick positioning of lift chair when she is sleeping. Pt also progressed with standing balance activities today and was able to complete high level balance tasks on airex pad and balance beam. Patient will continue to benefit from skilled physical therapy intervention in order to improve her lower extremity strength, gait pattern, ambulatory capacity, pain, as well as to improve her overall quality of life.    Personal Factors and Comorbidities Age;Comorbidity 3+;Past/Current  Experience;Sex;Time since onset of injury/illness/exacerbation;Transportation    Comorbidities HLD, hypothyroidism, ostepenia, osteoporis, SVT, clotting disorder    Examination-Activity Limitations Bathing;Bed Mobility;Bend;Caring for [employer];Locomotion Level;Lift;Hygiene/Grooming;Squat;Stairs;Stand;Toileting;Transfers    Examination-Participation Restrictions Church;Cleaning;Community Activity;Driving;Interpersonal Relationship;Personal Finances;Meal Prep;Laundry;Shop;Volunteer;Yard Work;Occupation    Stability/Clinical Decision Making Evolving/Moderate complexity    Rehab Potential Good    PT Frequency 2x / week    PT Duration 8 weeks    PT Treatment/Interventions ADLs/Self Care Home Management;Aquatic Therapy;Biofeedback;Cryotherapy;Electrical Stimulation;Iontophoresis 55m/ml Dexamethasone;Moist Heat;Ultrasound;DME Instruction;Gait training;Stair training;Functional mobility training;Neuromuscular re-education;Cognitive remediation;Balance training;Therapeutic exercise;Therapeutic activities;Patient/family education;Orthotic Fit/Training;Manual techniques;Compression bandaging;Manual lymph drainage;Scar mobilization;Passive range of motion;Energy conservation;Splinting;Taping;Vasopneumatic Device;Visual/perceptual remediation/compensation    PT Next Visit Plan WB activities, Gait and balance training as appropriate; Progressive LE strength and ROM activities. Progress Low back/knee gentle stretching.    PT Home Exercise Plan 09/12/2021=Access Code: [email or web address] URL: [email or web address]    Consulted and Agree with Plan of Care Patient  Patient will benefit from skilled therapeutic intervention in order to improve the following deficits and impairments:  Abnormal gait, Cardiopulmonary status limiting activity, Decreased activity tolerance, Decreased balance, Decreased knowledge of precautions, Decreased endurance, Decreased knowledge  of use of DME, Decreased range of motion, Decreased mobility, Difficulty walking, Decreased strength, Hypomobility, Increased edema, Impaired flexibility, Impaired perceived functional ability, Increased muscle spasms, Pain  Visit Diagnosis: Abnormality of gait and mobility  Difficulty in walking, not elsewhere classified  Bilateral low back pain without sciatica, unspecified chronicity  Pain in left lower leg     Problem List Patient Active Problem List   Diagnosis Date Noted   NSTEMI (non-ST elevated myocardial infarction) ([place]) 07/14/2021   Pulmonary nodule less than 6 mm determined by computed tomography of lung 07/14/2021   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) 06/11/2021   Cervical radiculopathy 06/11/2021   Fibula fracture 06/11/2021   Lower resp. tract infection 04/19/2021   Hepatic hemangioma 06/02/2020   Constipation 06/02/2020   Left ankle sprain 03/25/2018   Foot injury, left, initial encounter 03/25/2018   Osteoporosis of lumbar spine 02/19/2018   Subcutaneous nodule 09/16/2017   Pain and swelling of lower leg 09/04/2017   Nevus 09/04/2017   Pain of both hip joints 06/03/2017   Need for hepatitis C screening test 05/22/2017   SVT s/p ablation 01/09/2010    Particia Lather, PT 10/03/2021, 9:09 AM  [place] [address] [place], [place], [address] Phone: [phone]   Fax:  [phone]  Name: Michelle Armstrong MRN: [id number] Date of Birth: March 23, 1961

## 2021-10-08 ENCOUNTER — Other Ambulatory Visit: Payer: Self-pay

## 2021-10-08 ENCOUNTER — Ambulatory Visit: Payer: Managed Care, Other (non HMO) | Admitting: Physical Therapy

## 2021-10-08 DIAGNOSIS — R269 Unspecified abnormalities of gait and mobility: Secondary | ICD-10-CM | POA: Diagnosis not present

## 2021-10-08 DIAGNOSIS — R262 Difficulty in walking, not elsewhere classified: Secondary | ICD-10-CM

## 2021-10-08 DIAGNOSIS — M545 Low back pain, unspecified: Secondary | ICD-10-CM

## 2021-10-08 NOTE — Therapy (Signed)
[place] MAIN [facility] SERVICES [address] [place], [place], [address] Phone: [phone]   Fax:  [phone]  Physical Therapy Treatment  Patient Details  Name: Michelle Armstrong MRN: [id number] Date of Birth: May 06, 1961 Referring Provider (PT): Hildred Alamin PA   Encounter Date: 10/08/2021   PT End of Session - 10/08/21 0819     Visit Number 24    Number of Visits 32    Date for PT Re-Evaluation 10/31/21    Authorization Type Initial Eval 34/74/2595; Recert [id number]/5643-12/[id number]/5186    Progress Note Due on Visit 30    PT Start Time 0800    PT Stop Time 0845    PT Time Calculation (min) 45 min    Equipment Utilized During Treatment Other (comment);Gait belt   SPC/ankle brace   Activity Tolerance Patient tolerated treatment well    Behavior During Therapy [facility] for tasks assessed/performed             Past Medical History:  Diagnosis Date   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 02/2010 Echo: EF 55-60%, no rwma.   Vitamin D deficiency    low    Past Surgical History:  Procedure Laterality Date   ABDOMINAL HYSTERECTOMY  1999   Total lap hyst BSO due to endometriosis   CARDIAC ELECTROPHYSIOLOGY MAPPING AND ABLATION  2011   COLONOSCOPY WITH PROPOFOL N/A 07/10/2015   Procedure: COLONOSCOPY WITH PROPOFOL;  Surgeon: Lollie Sails, MD;  Location: [facility] ENDOSCOPY;  Service: Endoscopy;  Laterality: N/A;   GALLBLADDER SURGERY     HERNIA REPAIR  12/2010   LEFT  HEART CATH AND CORONARY ANGIOGRAPHY N/A 02/24/2017   Procedure: Left Heart Cath and Coronary Angiography;  Surgeon: Wellington Hampshire, MD;  Location: [place] CV LAB;  Service: Cardiovascular;  Laterality: N/A;   TONSILLECTOMY      There were no vitals filed for this visit.   Subjective Assessment - 10/08/21 0808     Subjective Patient reports continued pain and discomfort with lying supine. Reports attempting to lie supine multiple times per day over the weekend and this is causing her increased discomfort. She is concerned on her ability to sleep in this position in the fuure. Pt also has continued ankle pain and discomfort following therapy which she contributres in part to the [place] board activity, particularly in standing.    Patient is accompained by: Family member    Pertinent History 61 yo Female s/p fall with subsequent closed non-displaced segmental fracture of left fibula on 05/17/21. Pt reports she sustained her initial injury when she was leaving a resturaunt. She reprots she was stepping onto an outdoor carpeted surface which had a unmarked, large indentation beneath the carpet which could not be noticed. When she stepped into this area, her ankle turned and resulted in the fracture and fall. She was casted for 6 weeks and has  now been given a walking boot which she will wear for 6 weeks. MD order states, May gradually advance weight bearing as tolerated in the boot. Once full weightbearing, may wean out of boot She also suffered an L1 compression fracture (25% loss of height). She reports orthopedic MD has not evaluated her ankle. She has a referral to [facility] to evaluate ankle later today. 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 As far as she is aware she has not had a clot at this point. She also has osteopenia. Pt is unable to don/doff boot independently due to difficulty bending over, husband helps her; Pt reports increased  back pain with difficulty getting in/out of bed and has since been sleeping and living in a lift chair. PMH significant: Clotting disorder, HLD, hypothyroidism, Osteopenia, SVT, vitamin D deficiency    Limitations Sitting;Lifting;Standing;Walking;House hold activities    Diagnostic tests 10/2: MRI of lumbar spine: Evolving subacute compression fracture involving the superior  endplate of L1 with mild 25% height loss and trace 2 mm bony  retropulsion. No associated stenosis.  2. Small left foraminal disc protrusion with annular fissure at  L4-5, closely approximating and potentially irritating the exiting  left L4 nerve root.  3. Mild-to-moderate facet hypertrophy at L3-4 through L5-S1. X-ray of LLE: 1.  Mildly displaced oblique extra-articular fracture of the proximal left fibular diaphysis.   2.  Nondisplaced left lateral malleolar fracture.   3.  There is no significant osteoarthrosis.    Patient Stated Goals Pt wants to be able to walk normally again without pain and with normalized gait pattern and return to previous level of function.    Currently in Pain? Yes    Pain Location Back    Pain Onset More than a month ago    Pain Location Ankle    Pain Onset More than a month ago                 INTERVENTIONS:    Therapeutic Exercises:  Nustep level 1 x 6 minutes LE only for improved LE stregnth and conditioning.   Resistive Gait: (matrix cable system) Forward x 3 at 7.5 lb- good reciprocal gait pattern, some back, ankle, and hip pain noted today  Backward x 3 at 7.5 # *VC for technique- Patient responded well and able to walk with reciprocal steps.  Seated ankle rocker board  -1 min with board turned each direction (a/p, lateral, diagonal (x2)) -some ankle pain noted, particularly with a/p and in the area surrounding heel and malleoli.   Manual STM to musculature, tendons and ligament surrounding achilles tendon and bilateral malleoli posteriorly, pt noted improved s/s following.    The following activities were completed in parallel bars or at balance bar   Airex step ups x 10 forward and lateral (leading with involved LE)  -back and hip pain noted  Standing gastroc stretch  2 x 45 seconds   Step ups to 4 inch step with knee drive -min UE support x 10  -knee pain reported   Pt instructed in progressing with lying in modified supine position (similar to lift chair angle) in bed for 5-10 at a time as her pain and muscle spasms allow.                         PT Education - 10/08/21 0818     Education Details Educated to try lying  down for 5-10 minutes per day until next appointment or further instructions are rendered.    Person(s) Educated Patient    Methods Explanation;Demonstration    Comprehension Verbalized understanding              PT Short Term Goals - 09/24/21 0807       PT SHORT TERM GOAL #1   Title Patient will be independent in home exercise program to improve strength/mobility for better functional independence with ADLs.    Baseline 10/26: HEP given, Pt is independent and completes HEP regularly    Time 4    Period Weeks    Status Achieved    Target Date 08/08/21      PT SHORT TERM GOAL #2   Title Patient will report a worse VAS of 4/10 in LLE for improved tolerance of mobility and quality of life.    Baseline 4 of 10 in seated position but patient reports pain is up to 6-7 out of 10 at times. 09/05/2021= Patient reports pain level in ankle= 5/10 today- having more soreness due to increase use of ASO brace.    Time 4    Period Weeks    Status On-going    Target Date 10/03/21               PT Long Term Goals - 09/24/21 0807       PT LONG TERM GOAL #1   Title Patient will increase FOTO score to equal to or greater than 47%    to demonstrate statistically significant improvement in mobility and quality of life.    Baseline 10/26: 5%, 12/1:26.38%; 12/21= 40%    Time 8    Period Weeks    Status On-going     Target Date 10/31/21      PT LONG TERM GOAL #2   Title Patient will increase 10 meter walk test to >1.64ms as to improve gait speed for better community ambulation and to reduce fall risk.    Baseline 10/26: unable to ambulate at this time 12/1:.46m with RW,.4098mwith cane; 09/05/2021= 0.6 m/s using SPC    Time 8    Period Weeks    Status On-going    Target Date 09/27/21      PT LONG TERM GOAL #3   Title Patient will increase BLE gross strength to 4+/5 as to improve functional strength for independent gait, increased standing tolerance and increased ADL ability.    Baseline 10/26: see note, 12/1: Improved knee and hip strength to 4+/5 in seated position, ankle DF, EV and Inv still require strengthenigng, see flowsheets for more info; 12/22= Patient continues to present with 4+/5 Bilateral Hip flex/abd/knee ext/flex strength and improving ankle strength= 3+/5 ankle DF/PF/EV/IV.    Time 8    Period Weeks    Status Partially Met    Target Date 10/31/21      PT LONG TERM GOAL #4   Title Patient will tolerate weightbearing for >30 minutes for increased mobility and ambulation to increase indepenence with ADLs and iADLs    Baseline 10/26; able to tolerate approximately 15 minutes: 09/06/2021= Not directly tested in clinic today however patient and husband report that is she able to stand for 30 min at home.    Time 8    Period Weeks    Status Achieved      PT LONG TERM GOAL #5   Title Patient will increase ankle ROM to 10 degrees within normal range for improved gait mechanics and  reduced pain.    Baseline 10/26: see note, improved will all ROM since initial eval, see note for specific details: 09/06/2021= ROM measured at last visit 12/19 - has improved to within 10 deg of normal.    Time 8    Period Weeks    Status Achieved      PT LONG TERM GOAL #6   Title Pt will decrease worst low back pain as reported on NPRS by at least 2 points in order to demonstrate clinically significant  reduction in back pain.    Baseline 08/08/2021= LBP at worst = 7/10; 09/05/2021= 5/10 current low back pain- Will keep goal active to ensure patient's pain consistently decreases.    Time 8    Period Weeks    Status On-going    Target Date 10/31/21      PT LONG TERM GOAL #7   Title Pt will decrease 5TSTS by at least 3 seconds in order to demonstrate clinically significant improvement in LE strength.    Baseline 09/06/2021= 21.45 sec without UE support    Time 8    Period Weeks    Status New    Target Date 10/31/21      PT LONG TERM GOAL #8   Title Pt will improve BERG by at least 3 points in order to demonstrate clinically significant improvement in balance.    Baseline 09/05/2021= 50/56    Time 8    Period Weeks    Status New    Target Date 10/31/21                   Plan - 10/08/21 0820     Clinical Impression Statement Pt presents with excellent motivation to improve with all of her physical therapy related exercises. Pt still having difficulty with laying supine and has increased levels of back and hip pain with this positioning. Pt instructed to gradually progress with this positioing at home for 5-10 minutes per day in her bed. Pt did progress with ability to perform step ups this session but still had reports of knee pain and discomfort. Patient will continue to benefit from skilled physical therapy intervention in order to improve her lower extremity strength, gait pattern, ambulatory capacity, pain, as well as to improve her overall quality of life.    Personal Factors and Comorbidities Age;Comorbidity 3+;Past/Current Experience;Sex;Time since onset of injury/illness/exacerbation;Transportation    Comorbidities HLD, hypothyroidism, ostepenia, osteoporis, SVT, clotting disorder    Examination-Activity Limitations Bathing;Bed Mobility;Bend;Caring for [employer];Locomotion  Level;Lift;Hygiene/Grooming;Squat;Stairs;Stand;Toileting;Transfers    Examination-Participation Restrictions Church;Cleaning;Community Activity;Driving;Interpersonal Relationship;Personal Finances;Meal Prep;Laundry;Shop;Volunteer;Yard Work;Occupation    Stability/Clinical Decision Making Evolving/Moderate complexity    Rehab Potential Good    PT Frequency 2x / week    PT Duration 8 weeks    PT Treatment/Interventions ADLs/Self Care Home Management;Aquatic Therapy;Biofeedback;Cryotherapy;Electrical Stimulation;Iontophoresis 43m/ml Dexamethasone;Moist Heat;Ultrasound;DME Instruction;Gait training;Stair training;Functional mobility training;Neuromuscular re-education;Cognitive remediation;Balance training;Therapeutic exercise;Therapeutic activities;Patient/family education;Orthotic Fit/Training;Manual techniques;Compression bandaging;Manual lymph drainage;Scar mobilization;Passive range of motion;Energy conservation;Splinting;Taping;Vasopneumatic Device;Visual/perceptual remediation/compensation    PT Next Visit Plan WB activities, Gait and balance training as appropriate; Progressive LE strength and ROM activities. Progress Low back/knee gentle stretching.    PT Home Exercise Plan 09/12/2021=Access Code: [email or web address] URL: [email or web address]    Consulted and Agree with Plan of Care Patient             Patient will benefit from skilled therapeutic intervention in order to improve the following deficits and impairments:  Abnormal gait, Cardiopulmonary status limiting activity, Decreased activity tolerance, Decreased balance, Decreased knowledge  of precautions, Decreased endurance, Decreased knowledge of use of DME, Decreased range of motion, Decreased mobility, Difficulty walking, Decreased strength, Hypomobility, Increased edema, Impaired flexibility, Impaired perceived functional ability, Increased muscle spasms, Pain  Visit Diagnosis: Abnormality of gait and mobility  Difficulty in  walking, not elsewhere classified  Bilateral low back pain without sciatica, unspecified chronicity     Problem List Patient Active Problem List   Diagnosis Date Noted   NSTEMI (non-ST elevated myocardial infarction) ([place]) 07/14/2021   Pulmonary nodule less than 6 mm determined by computed tomography of lung 07/14/2021   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 tract infection 04/19/2021   Hepatic hemangioma 06/02/2020   Constipation 06/02/2020   Left ankle sprain 03/25/2018   Foot injury, left, initial encounter 03/25/2018   Osteoporosis of lumbar spine 02/19/2018   Subcutaneous nodule 09/16/2017   Pain and swelling of lower leg 09/04/2017   Nevus 09/04/2017   Pain of both hip joints 06/03/2017   Need for hepatitis C screening test 05/22/2017   SVT s/p ablation 01/09/2010    Particia Lather, PT 10/08/2021, 9:11 AM  [place] [address] [place], [place], [address] Phone: [phone]   Fax:  [phone]  Name: JEANISE DURFEY MRN: [id number] Date of Birth: August 06, 1961

## 2021-10-10 ENCOUNTER — Ambulatory Visit: Payer: Managed Care, Other (non HMO) | Admitting: Physical Therapy

## 2021-10-11 ENCOUNTER — Ambulatory Visit: Payer: Managed Care, Other (non HMO)

## 2021-10-11 ENCOUNTER — Other Ambulatory Visit: Payer: Self-pay

## 2021-10-11 DIAGNOSIS — M545 Low back pain, unspecified: Secondary | ICD-10-CM

## 2021-10-11 DIAGNOSIS — G8929 Other chronic pain: Secondary | ICD-10-CM

## 2021-10-11 DIAGNOSIS — M6281 Muscle weakness (generalized): Secondary | ICD-10-CM

## 2021-10-11 DIAGNOSIS — R262 Difficulty in walking, not elsewhere classified: Secondary | ICD-10-CM

## 2021-10-11 DIAGNOSIS — R269 Unspecified abnormalities of gait and mobility: Secondary | ICD-10-CM | POA: Diagnosis not present

## 2021-10-11 DIAGNOSIS — M79662 Pain in left lower leg: Secondary | ICD-10-CM

## 2021-10-11 NOTE — Therapy (Signed)
[place] MAIN [facility] SERVICES [address] [place], [place], [address] Phone: [phone]   Fax:  [phone]  Physical Therapy Treatment  Patient Details  Name: Michelle Armstrong MRN: [id number] Date of Birth: 04/07/1961 Referring Provider (PT): Hildred Alamin PA   Encounter Date: 10/11/2021   PT End of Session - 10/11/21 0800     Visit Number 25    Number of Visits 32    Date for PT Re-Evaluation 10/31/21    Authorization Type Initial Eval 94/49/6759; Recert [id number]/[id number]/5701    Progress Note Due on Visit 30    PT Start Time 0800    PT Stop Time 0845    PT Time Calculation (min) 45 min    Equipment Utilized During Treatment Other (comment);Gait belt   SPC/ankle brace   Activity Tolerance Patient tolerated treatment well    Behavior During Therapy [facility] for tasks assessed/performed             Past Medical History:  Diagnosis Date   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 02/2010 Echo: EF 55-60%, no rwma.   Vitamin D deficiency    low    Past Surgical History:  Procedure Laterality Date   ABDOMINAL HYSTERECTOMY  1999   Total lap hyst BSO due to endometriosis   CARDIAC ELECTROPHYSIOLOGY MAPPING AND ABLATION  2011   COLONOSCOPY WITH PROPOFOL N/A 07/10/2015   Procedure: COLONOSCOPY WITH PROPOFOL;  Surgeon: Lollie Sails, MD;  Location: [facility] ENDOSCOPY;  Service: Endoscopy;  Laterality: N/A;   GALLBLADDER SURGERY     HERNIA REPAIR  12/2010   LEFT  HEART CATH AND CORONARY ANGIOGRAPHY N/A 02/24/2017   Procedure: Left Heart Cath and Coronary Angiography;  Surgeon: Wellington Hampshire, MD;  Location: [facility];  Service: Cardiovascular;  Laterality: N/A;   TONSILLECTOMY      There were no vitals filed for this visit.   Subjective Assessment - 10/11/21 0800     Subjective Patient reports she is getting better overall- states she is continuing to work on sleeping in bed but still pain limited. She reports being more independent in the home although modifying activities. She reports compliance with current home program as well.    Patient is accompained by: Family member    Pertinent History 61 yo Female s/p fall with subsequent closed non-displaced segmental fracture of left fibula on 05/17/21. Pt reports she sustained her initial injury when she was leaving a resturaunt. She reprots she was stepping onto an outdoor carpeted surface which had a unmarked, large indentation beneath the carpet which could not be noticed. When she stepped into this area, her ankle turned and resulted in the fracture and fall. She was casted for 6 weeks and has now been given a walking boot which she will wear for 6 weeks. MD order states, May gradually advance weight bearing as tolerated in  the boot. Once full weightbearing, may wean out of boot She also suffered an L1 compression fracture (25% loss of height). She reports orthopedic MD has not evaluated her ankle. She has a referral to [facility] to evaluate ankle later today. 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 As far as she is aware she has not had a clot at this point. She also has osteopenia. Pt is unable to don/doff boot independently due to difficulty bending over, husband helps her; Pt reports increased back pain with difficulty getting in/out of bed and has since been sleeping and living in a lift chair. PMH significant: Clotting disorder, HLD,  hypothyroidism, Osteopenia, SVT, vitamin D deficiency    Limitations Sitting;Lifting;Standing;Walking;House hold activities    Diagnostic tests 10/2: MRI of lumbar spine: Evolving subacute compression fracture involving the superior  endplate of L1 with mild 25% height loss and trace 2 mm bony  retropulsion. No associated stenosis.  2. Small left foraminal disc protrusion with annular fissure at  L4-5, closely approximating and potentially irritating the exiting  left L4 nerve root.  3. Mild-to-moderate facet hypertrophy at L3-4 through L5-S1. X-ray of LLE: 1.  Mildly displaced oblique extra-articular fracture of the proximal left fibular diaphysis.   2.  Nondisplaced left lateral malleolar fracture.   3.  There is no significant osteoarthrosis.    Patient Stated Goals Pt wants to be able to walk normally again without pain and with normalized gait pattern and return to previous level of function.    Currently in Pain? Yes    Pain Location --   mid/low back/Left hip/knee/ankle   Pain Onset --    Pain Onset --            Interventions:     Nustep L0 for 5 min: BLE only at seat level 8 for LE power, strength, endurance.   [employer] Gait trainer on Treadmill x 3 min today walking at 0.26 cycles/sec with 1 UE support. Time on each foot= R=53% and L= 47%. Patient demonstrated good reciprocal steps with heel to toe gait sequencing. She did endorse some low back pain with walking yet able to complete task well without any LOB or any significant asymmetry with gait.   Educated in static standing balance exercises at // bars today:  Feet narrowed with eyes open x 20 sec; progressed to eyes closed x 20 sec (no unsteadiness); progressed to eyes open- head turns x 5 and then eyes closed with head turns x 5 (no unsteadiness).   Progressed to staggered stance in all same position changes as feet narrowed with similar results so progressed to tandem stance.  Tandem- eyes open x 20 sec each leg (no  difficulty or significant sway/unsteadiness) so progressed to eyes closed- Some increased sway more with right foot in back - attempted each leg x 2 trials of 10-20 sec hold. *Added to HEP  Single leg stance- Attempted each leg to hold for 10 sec x multiple attempts- No difficulty standing on right LE yet some unsteadiness standing on left - she did improve with practice citing increased pain in anterior lower leg and hip/back but able to complete task- *Added to HEP.   Ladder drills: (all performed without UE support inside of // bars) Forward - 1 foot into a square- reciprocal steps x length of ladder and back x 2 trials Backward- Initially 2 feet into each square x 2 trials but then progressed to  more reciprocal retro gait- 1 foot into a square.  Side stepping- 2 feet into each square to left then back to right x 2 trials- no significant difficulty with balance   Toe walk- In // bars- ambulate length of bars and back x 2 trials without UE support. Heel walk- In // bars- ambulate length of bars and back x 2 trials without UE support *Added both to HEP  Cone work- Set up approx 8 cones approx 2-3 feet apart - Initially patient ambulated forward weaving around 8 cones without an AD and no difficulty. Progressed to Backward - around 8 cones and back without significant difficulty. Progressed to box -style stepping around cones- x length of cones and no difficulty. Progressed to side stepping over cones x 8 with some increased report of pain yet able to complete without loss of balance. Picking up cones x 8 using squat technique with only VC to widen base of support.   Education provided throughout session via VC/TC and demonstration to facilitate movement at target joints and correct muscle activation for all testing and exercises performed.                             PT Education - 10/11/21 0912     Education Details Home exercises including balance and LE strengthening     Person(s) Educated Patient    Methods Explanation;Demonstration;Tactile cues;Verbal cues;Handout    Comprehension Verbalized understanding;Returned demonstration;Verbal cues required;Tactile cues required;Need further instruction              PT Short Term Goals - 09/24/21 0807       PT SHORT TERM GOAL #1   Title Patient will be independent in home exercise program to improve strength/mobility for better functional independence with ADLs.    Baseline 10/26: HEP given, Pt is independent and completes HEP regularly    Time 4    Period Weeks    Status Achieved    Target Date 08/08/21      PT SHORT TERM GOAL #2   Title Patient will report a worse VAS of 4/10 in LLE for improved tolerance of mobility and quality of life.    Baseline 4 of 10 in seated position but patient reports pain is up to 6-7 out of 10 at times. 09/05/2021= Patient reports pain level in ankle= 5/10 today- having more soreness due to increase use of ASO brace.    Time 4    Period Weeks    Status On-going    Target Date 10/03/21               PT Long Term Goals - 09/24/21 0807       PT LONG TERM GOAL #1   Title Patient will increase FOTO score to equal to or greater than 47%    to demonstrate statistically significant improvement in mobility and quality of life.    Baseline 10/26: 5%, 12/1:26.38%; 12/21= 40%    Time 8    Period Weeks    Status On-going    Target Date 10/31/21      PT LONG TERM GOAL #2   Title Patient will increase 10 meter walk test to >1.8m/s as to improve gait speed for better community ambulation and to reduce fall risk.    Baseline 10/26: unable to ambulate at this time 12/1:.73m/s with RW,.8m/s with cane; 09/05/2021= 0.6 m/s using SPC    Time 8    Period Weeks  Status On-going    Target Date 09/27/21      PT LONG TERM GOAL #3   Title Patient will increase BLE gross strength to 4+/5 as to improve functional strength for independent gait, increased standing tolerance and  increased ADL ability.    Baseline 10/26: see note, 12/1: Improved knee and hip strength to 4+/5 in seated position, ankle DF, EV and Inv still require strengthenigng, see flowsheets for more info; 12/22= Patient continues to present with 4+/5 Bilateral Hip flex/abd/knee ext/flex strength and improving ankle strength= 3+/5 ankle DF/PF/EV/IV.    Time 8    Period Weeks    Status Partially Met    Target Date 10/31/21      PT LONG TERM GOAL #4   Title Patient will tolerate weightbearing for >30 minutes for increased mobility and ambulation to increase indepenence with ADLs and iADLs    Baseline 10/26; able to tolerate approximately 15 minutes: 09/06/2021= Not directly tested in clinic today however patient and husband report that is she able to stand for 30 min at home.    Time 8    Period Weeks    Status Achieved      PT LONG TERM GOAL #5   Title Patient will increase ankle ROM to 10 degrees within normal range for improved gait mechanics and reduced pain.    Baseline 10/26: see note, improved will all ROM since initial eval, see note for specific details: 09/06/2021= ROM measured at last visit 12/19 - has improved to within 10 deg of normal.    Time 8    Period Weeks    Status Achieved      PT LONG TERM GOAL #6   Title Pt will decrease worst low back pain as reported on NPRS by at least 2 points in order to demonstrate clinically significant reduction in back pain.    Baseline 08/08/2021= LBP at worst = 7/10; 09/05/2021= 5/10 current low back pain- Will keep goal active to ensure patient's pain consistently decreases.    Time 8    Period Weeks    Status On-going    Target Date 10/31/21      PT LONG TERM GOAL #7   Title Pt will decrease 5TSTS by at least 3 seconds in order to demonstrate clinically significant improvement in LE strength.    Baseline 09/06/2021= 21.45 sec without UE support    Time 8    Period Weeks    Status New    Target Date 10/31/21      PT LONG TERM GOAL #8    Title Pt will improve BERG by at least 3 points in order to demonstrate clinically significant improvement in balance.    Baseline 09/05/2021= 50/56    Time 8    Period Weeks    Status New    Target Date 10/31/21                   Plan - 10/11/21 0801     Clinical Impression Statement Patient arrived today with excellent motivation for session. She was able to walk on Treadmill and exhibit good heel to toe sequencing on left and consistent reciprocal steps with only 1UE support. She was able to perform all static and dynamic balance activities well without significant unsteadiness or loss of balance. She continues to endorse some pain with standing/balance activities but able to complete all tasks with VC's. Patient will continue to benefit from skilled physical therapy intervention in order to improve her lower  extremity strength, gait pattern, ambulatory capacity, pain, as well as to improve her overall quality of life.    Personal Factors and Comorbidities Age;Comorbidity 3+;Past/Current Experience;Sex;Time since onset of injury/illness/exacerbation;Transportation    Comorbidities HLD, hypothyroidism, ostepenia, osteoporis, SVT, clotting disorder    Examination-Activity Limitations Bathing;Bed Mobility;Bend;Caring for [employer];Locomotion Level;Lift;Hygiene/Grooming;Squat;Stairs;Stand;Toileting;Transfers    Examination-Participation Restrictions Church;Cleaning;Community Activity;Driving;Interpersonal Relationship;Personal Finances;Meal Prep;Laundry;Shop;Volunteer;Yard Work;Occupation    Stability/Clinical Decision Making Evolving/Moderate complexity    Rehab Potential Good    PT Frequency 2x / week    PT Duration 8 weeks    PT Treatment/Interventions ADLs/Self Care Home Management;Aquatic Therapy;Biofeedback;Cryotherapy;Electrical Stimulation;Iontophoresis 45m/ml Dexamethasone;Moist Heat;Ultrasound;DME Instruction;Gait training;Stair  training;Functional mobility training;Neuromuscular re-education;Cognitive remediation;Balance training;Therapeutic exercise;Therapeutic activities;Patient/family education;Orthotic Fit/Training;Manual techniques;Compression bandaging;Manual lymph drainage;Scar mobilization;Passive range of motion;Energy conservation;Splinting;Taping;Vasopneumatic Device;Visual/perceptual remediation/compensation    PT Next Visit Plan Continue to progress static/dynamic standing balance; LE/core/back strengthening as appropriate.    PT Home Exercise Plan 09/12/2021=Access Code: [email or web address] URL: [email or web address]    Consulted and Agree with Plan of Care Patient             Patient will benefit from skilled therapeutic intervention in order to improve the following deficits and impairments:  Abnormal gait, Cardiopulmonary status limiting activity, Decreased activity tolerance, Decreased balance, Decreased knowledge of precautions, Decreased endurance, Decreased knowledge of use of DME, Decreased range of motion, Decreased mobility, Difficulty walking, Decreased strength, Hypomobility, Increased edema, Impaired flexibility, Impaired perceived functional ability, Increased muscle spasms, Pain  Visit Diagnosis: Abnormality of gait and mobility  Difficulty in walking, not elsewhere classified  Muscle weakness (generalized)  Chronic bilateral low back pain without sciatica  Pain in left lower leg     Problem List Patient Active Problem List   Diagnosis Date Noted   NSTEMI (non-ST elevated myocardial infarction) ([place] 07/14/2021   Pulmonary nodule less than 6 mm determined by computed tomography of lung 07/14/2021   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 tract infection 04/19/2021   Hepatic hemangioma  06/02/2020   Constipation 06/02/2020   Left ankle sprain 03/25/2018   Foot injury, left, initial encounter 03/25/2018   Osteoporosis of lumbar spine 02/19/2018   Subcutaneous nodule 09/16/2017   Pain and swelling of lower leg 09/04/2017   [place] 09/04/2017   Pain of both hip joints 06/03/2017   Need for hepatitis C screening test 05/22/2017   SVT s/p ablation 01/09/2010    JLewis Moccasin PT 10/11/2021, 10:57 AM  [address] [address]Phone: [phone]  Fax:  [phone] Name: Michelle EZZELLMRN: [id number] of Birth: [birth year]

## 2021-10-15 ENCOUNTER — Ambulatory Visit: Payer: Managed Care, Other (non HMO) | Admitting: Physical Therapy

## 2021-10-15 ENCOUNTER — Other Ambulatory Visit: Payer: Self-pay

## 2021-10-15 DIAGNOSIS — M6281 Muscle weakness (generalized): Secondary | ICD-10-CM

## 2021-10-15 DIAGNOSIS — G8929 Other chronic pain: Secondary | ICD-10-CM

## 2021-10-15 DIAGNOSIS — R269 Unspecified abnormalities of gait and mobility: Secondary | ICD-10-CM

## 2021-10-15 DIAGNOSIS — R262 Difficulty in walking, not elsewhere classified: Secondary | ICD-10-CM

## 2021-10-15 NOTE — Therapy (Signed)
[place] °[facility] °[address] °[place], [place], [address] °Phone: [phone]   Fax:  [phone] ° °Physical Therapy Treatment ° °Patient Details  °Name: Michelle Armstrong °MRN: [id number] °Date of Birth: 01/31/1961 °Referring Provider (PT): Dye, Jennifer PA ° ° °Encounter Date: 10/15/2021 ° ° PT End of Session - 10/15/21 0802   ° ° Visit Number 26   ° Number of Visits 32   ° Date for PT Re-Evaluation 10/31/21   ° Authorization Type Initial Eval 07/11/2021; Recert 09/05/2021-10/31/2021   ° Progress Note Due on Visit 30   ° PT Start Time 0800   ° PT Stop Time 0845   ° PT Time Calculation (min) 45 min   ° Equipment Utilized During Treatment Other (comment);Gait belt   SPC/ankle brace  ° Activity Tolerance Patient tolerated treatment well   ° Behavior During Therapy WFL for tasks assessed/performed   ° °  °  ° °  ° ° °Past Medical History:  °Diagnosis Date  ° 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 02/2010 Echo: EF 55-60%, no rwma.  ° Vitamin D deficiency   ° low  ° ° °Past Surgical History:  °Procedure Laterality Date  ° ABDOMINAL HYSTERECTOMY  1999  ° Total lap hyst BSO due to endometriosis  ° CARDIAC ELECTROPHYSIOLOGY MAPPING AND ABLATION  2011  ° COLONOSCOPY WITH PROPOFOL N/A 07/10/2015  ° Procedure: COLONOSCOPY WITH PROPOFOL;  Surgeon: Martin U Skulskie, MD;  Location: [facility] ENDOSCOPY;  Service: Endoscopy;  Laterality: N/A;  ° GALLBLADDER SURGERY    ° HERNIA REPAIR  12/2010  ° LEFT  HEART CATH AND CORONARY ANGIOGRAPHY N/A 02/24/2017  ° Procedure: Left Heart Cath and Coronary Angiography;  Surgeon: Arida, Muhammad A, MD;  Location: [facility] INVASIVE CV [facility];  Service: Cardiovascular;  Laterality: N/A;  ° TONSILLECTOMY    ° ° °There were no vitals filed for this visit. ° ° Subjective Assessment - 10/15/21 0854   ° ° Subjective Pt rpeorts she is continueing to get better overall but still has multiple areas of pain and discomfort. Pt reports her mobility has improved signifiacntly and she is happy with her progress but wishes the progress was more swift. Reports visit with MD went well and everything is healing properly. Pt also reports MD says she can ambulate without ASO brace as she is able.   ° Patient is accompained by: Family member   ° Pertinent History 61 yo Female s/p fall with subsequent closed non-displaced segmental fracture of left fibula on 05/17/21. Pt reports she sustained her initial injury when she was leaving a resturaunt. She reprots she was stepping onto an outdoor carpeted surface which had a unmarked, large indentation beneath the carpet which could not be noticed. When she stepped into this area, her ankle turned and resulted in the fracture and fall. She was casted for 6 weeks and has   now been given a walking boot which she will wear for 6 weeks. MD order states, “May gradually advance weight bearing as tolerated in the boot. Once full weightbearing, may wean out of boot” She also suffered an L1 compression fracture (25% loss of height). She reports orthopedic MD has not evaluated her ankle. She has a referral to [facility] to evaluate ankle later today. 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 As far as she is aware she has not had a clot at this point. She also has osteopenia. Pt is unable to don/doff boot independently due to difficulty bending over, husband helps her; Pt reports increased back pain with difficulty getting  in/out of bed and has since been sleeping and living in a lift chair. PMH significant: Clotting disorder, HLD, hypothyroidism, Osteopenia, SVT, vitamin D deficiency   ° Limitations Sitting;Lifting;Standing;Walking;House hold activities   ° Diagnostic tests 10/2: MRI of lumbar spine: Evolving subacute compression fracture involving the superior  endplate of L1 with mild 25% height loss and trace 2 mm bony  retropulsion. No associated stenosis.  2. Small left foraminal disc protrusion with annular fissure at  L4-5, closely approximating and potentially irritating the exiting  left L4 nerve root.  3. Mild-to-moderate facet hypertrophy at L3-4 through L5-S1. X-ray of LLE: 1.  Mildly displaced oblique extra-articular fracture of the proximal left fibular diaphysis.   2.  Nondisplaced left lateral malleolar fracture.   3.  There is no significant osteoarthrosis.   ° Patient Stated Goals Pt wants to be able to walk normally again without pain and with normalized gait pattern and return to previous level of function.   ° Currently in Pain? Yes   ° °  °  ° °  ° ° °INTERVENTIONS:  °  °Therapeutic Exercises:  °Nustep level 1 x 6 minutes LE only for improved LE stregnth and conditioning. °  °Resistive Gait: (matrix cable system) °Forward x 3 at 12.5 lb- good reciprocal gait pattern °Backward x 3 at 7.5 # °*VC for technique- Patient responded well and able to walk with reciprocal steps. ° °Seated ankle rocker board  °-1 min with board turned each direction (a/p, lateral, diagonal (x2)) °-some ankle pain noted, particularly with a/p and in the area surrounding heel and malleoli.  ° °Manual STM to musculature, tendons and ligament surrounding achilles tendon and bilateral malleoli posteriorly, pt noted improved s/s following.  ° °The following activities were completed in parallel bars or at balance bar  ° °Airex step ups with knee drive to promote L ankle stability 2 x 10 forward and lateral (leading with involved LE)  °-back and  hip pain noted ° °Standing gastroc stretch  °2 x 45 seconds  ° °Standing mini RDL (cane to knees)  °-VC and visual cues for technique, will continue to progress in future sessions.  ° °Pt educated throughout session about proper posture and technique with exercises. Improved exercise technique, movement at target joints, use of target muscles after min to mod verbal, visual, tactile cues. °Note: Portions of this document were prepared using Dragon voice recognition software and although reviewed may contain unintentional dictation errors in syntax, grammar, or spelling. ° ° ° ° ° ° ° ° ° ° ° ° ° ° ° ° ° ° ° ° ° ° ° ° ° ° ° ° ° ° ° ° ° PT Education - 10/15/21 0856   ° ° Education Details Exercise form and   technique   ° Person(s) Educated Patient   ° Methods Explanation   ° Comprehension Verbalized understanding   ° °  °  ° °  ° ° ° PT Short Term Goals - 09/24/21 0807   ° °  ° PT SHORT TERM GOAL #1  ° Title Patient will be independent in home exercise program to improve strength/mobility for better functional independence with ADLs.   ° Baseline 10/26: HEP given, Pt is independent and completes HEP regularly   ° Time 4   ° Period Weeks   ° Status Achieved   ° Target Date 08/08/21   °  ° PT SHORT TERM GOAL #2  ° Title Patient will report a worse VAS of 4/10 in LLE for improved tolerance of mobility and quality of life.   ° Baseline 4 of 10 in seated position but patient reports pain is up to 6-7 out of 10 at times. 09/05/2021= Patient reports pain level in ankle= 5/10 today- having more soreness due to increase use of ASO brace.   ° Time 4   ° Period Weeks   ° Status On-going   ° Target Date 10/03/21   ° °  °  ° °  ° ° ° ° PT Long Term Goals - 09/24/21 0807   ° °  ° PT LONG TERM GOAL #1  ° Title Patient will increase FOTO score to equal to or greater than 47%    to demonstrate statistically significant improvement in mobility and quality of life.   ° Baseline 10/26: 5%, 12/1:26.38%; 12/21= 40%   ° Time 8   ° Period  Weeks   ° Status On-going   ° Target Date 10/31/21   °  ° PT LONG TERM GOAL #2  ° Title Patient will increase 10 meter walk test to >1.0m/s as to improve gait speed for better community ambulation and to reduce fall risk.   ° Baseline 10/26: unable to ambulate at this time 12/1:.45m/s with RW,.40m/s with cane; 09/05/2021= 0.6 m/s using SPC   ° Time 8   ° Period Weeks   ° Status On-going   ° Target Date 09/27/21   °  ° PT LONG TERM GOAL #3  ° Title Patient will increase BLE gross strength to 4+/5 as to improve functional strength for independent gait, increased standing tolerance and increased ADL ability.   ° Baseline 10/26: see note, 12/1: Improved knee and hip strength to 4+/5 in seated position, ankle DF, EV and Inv still require strengthenigng, see flowsheets for more info; 12/22= Patient continues to present with 4+/5 Bilateral Hip flex/abd/knee ext/flex strength and improving ankle strength= 3+/5 ankle DF/PF/EV/IV.   ° Time 8   ° Period Weeks   ° Status Partially Met   ° Target Date 10/31/21   °  ° PT LONG TERM GOAL #4  ° Title Patient will tolerate weightbearing for >30 minutes for increased mobility and ambulation to increase indepenence with ADLs and iADLs   ° Baseline 10/26; able to tolerate approximately 15 minutes: 09/06/2021= Not directly tested in clinic today however patient and husband report that is she able to stand for 30 min at home.   ° Time 8   ° Period Weeks   ° Status Achieved   °  ° PT LONG TERM GOAL #5  ° Title Patient will increase ankle ROM to 10 degrees within normal range for improved gait mechanics and reduced pain.   ° Baseline 10/26: see note, improved will all ROM   since initial eval, see note for specific details: 09/06/2021= ROM measured at last visit 12/19 - has improved to within 10 deg of normal.   ° Time 8   ° Period Weeks   ° Status Achieved   °  ° PT LONG TERM GOAL #6  ° Title Pt will decrease worst low back pain as reported on NPRS by at least 2 points in order to  demonstrate clinically significant reduction in back pain.   ° Baseline 08/08/2021= LBP at worst = 7/10; 09/05/2021= 5/10 current low back pain- Will keep goal active to ensure patient's pain consistently decreases.   ° Time 8   ° Period Weeks   ° Status On-going   ° Target Date 10/31/21   °  ° PT LONG TERM GOAL #7  ° Title Pt will decrease 5TSTS by at least 3 seconds in order to demonstrate clinically significant improvement in LE strength.   ° Baseline 09/06/2021= 21.45 sec without UE support   ° Time 8   ° Period Weeks   ° Status New   ° Target Date 10/31/21   °  ° PT LONG TERM GOAL #8  ° Title Pt will improve BERG by at least 3 points in order to demonstrate clinically significant improvement in balance.   ° Baseline 09/05/2021= 50/56   ° Time 8   ° Period Weeks   ° Status New   ° Target Date 10/31/21   ° °  °  ° °  ° ° ° ° ° ° ° ° Plan - 10/15/21 0857   ° ° Clinical Impression Statement Pt presents with excellent motivation for completion of PT interventions. Pt continues to show progress with functional strengthening activities but does continue to have pain and discomfort with some interventions. Copmleted all activities in today's sesion without ASO brace and although pt had some discomfort overall she tolerated lack of ASO brace well. Pt continues to progress with dynamic balance activities and with ability to lie in her bed with improved reports of low back spasms but still has stinging like pain with lying in bed.Patient will continue to benefit from skilled physical therapy intervention in order to improve her lower extremity strength, gait pattern, ambulatory capacity, pain, as well as to improve her overall quality of life.   ° Personal Factors and Comorbidities Age;Comorbidity 3+;Past/Current Experience;Sex;Time since onset of injury/illness/exacerbation;Transportation   ° Comorbidities HLD, hypothyroidism, ostepenia, osteoporis, SVT, clotting disorder   ° Examination-Activity Limitations Bathing;Bed  Mobility;Bend;Caring for Others;Carry;Dressing;Sleep;Sit;Reach Overhead;Locomotion Level;Lift;Hygiene/Grooming;Squat;Stairs;Stand;Toileting;Transfers   ° Examination-Participation Restrictions Church;Cleaning;Community Activity;Driving;Interpersonal Relationship;Personal Finances;Meal Prep;Laundry;Shop;Volunteer;Yard Work;Occupation   ° Stability/Clinical Decision Making Evolving/Moderate complexity   ° Rehab Potential Good   ° PT Frequency 2x / week   ° PT Duration 8 weeks   ° PT Treatment/Interventions ADLs/Self Care Home Management;Aquatic Therapy;Biofeedback;Cryotherapy;Electrical Stimulation;Iontophoresis 4mg/ml Dexamethasone;Moist Heat;Ultrasound;DME Instruction;Gait training;Stair training;Functional mobility training;Neuromuscular re-education;Cognitive remediation;Balance training;Therapeutic exercise;Therapeutic activities;Patient/family education;Orthotic Fit/Training;Manual techniques;Compression bandaging;Manual lymph drainage;Scar mobilization;Passive range of motion;Energy conservation;Splinting;Taping;Vasopneumatic Device;Visual/perceptual remediation/compensation   ° PT Next Visit Plan Continue to progress static/dynamic standing balance; LE/core/back strengthening as appropriate.   ° PT Home Exercise Plan 09/12/2021=Access Code: [email or web address]  URL: [email or web address]   ° Consulted and Agree with Plan of Care Patient   ° °  °  ° °  ° ° °Patient will benefit from skilled therapeutic intervention in order to improve the following deficits and impairments:  Abnormal gait, Cardiopulmonary status limiting activity, Decreased activity tolerance, Decreased balance, Decreased knowledge of precautions, Decreased endurance, Decreased knowledge of use of   DME, Decreased range of motion, Decreased mobility, Difficulty walking, Decreased strength, Hypomobility, Increased edema, Impaired flexibility, Impaired perceived functional ability, Increased muscle spasms, Pain ° °Visit Diagnosis: °Abnormality  of gait and mobility ° °Difficulty in walking, not elsewhere classified ° °Muscle weakness (generalized) ° °Chronic bilateral low back pain without sciatica ° ° ° ° °Problem List °Patient Active Problem List  ° Diagnosis Date Noted  ° NSTEMI (non-ST elevated myocardial infarction) (HCC) 07/14/2021  ° Pulmonary nodule less than 6 mm determined by computed tomography of lung 07/14/2021  ° 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 tract infection 04/19/2021  ° Hepatic hemangioma 06/02/2020  ° Constipation 06/02/2020  ° Left ankle sprain 03/25/2018  ° Foot injury, left, initial encounter 03/25/2018  ° Osteoporosis of lumbar spine 02/19/2018  ° Subcutaneous nodule 09/16/2017  ° Pain and swelling of lower leg 09/04/2017  ° Nevus 09/04/2017  ° Pain of both hip joints 06/03/2017  ° Need for hepatitis C screening test 05/22/2017  ° SVT s/p ablation 01/09/2010  ° ° °Christopher B Byrd, PT °10/15/2021, 9:10 AM ° °[place] °[facility] °[address] °[place], [place], [address] °Phone: [phone]   Fax:  [phone] ° °Name: Vasilia A Zayed °MRN: [id number] °Date of Birth: 10/13/1960 ° ° ° °

## 2021-10-17 ENCOUNTER — Other Ambulatory Visit: Payer: Self-pay

## 2021-10-17 ENCOUNTER — Encounter: Payer: Self-pay | Admitting: Physical Therapy

## 2021-10-17 ENCOUNTER — Ambulatory Visit: Payer: Managed Care, Other (non HMO) | Attending: Physician Assistant | Admitting: Physical Therapy

## 2021-10-17 DIAGNOSIS — R262 Difficulty in walking, not elsewhere classified: Secondary | ICD-10-CM | POA: Diagnosis present

## 2021-10-17 DIAGNOSIS — M6281 Muscle weakness (generalized): Secondary | ICD-10-CM | POA: Diagnosis present

## 2021-10-17 DIAGNOSIS — G8929 Other chronic pain: Secondary | ICD-10-CM | POA: Diagnosis present

## 2021-10-17 DIAGNOSIS — M545 Low back pain, unspecified: Secondary | ICD-10-CM | POA: Insufficient documentation

## 2021-10-17 DIAGNOSIS — R269 Unspecified abnormalities of gait and mobility: Secondary | ICD-10-CM | POA: Insufficient documentation

## 2021-10-17 DIAGNOSIS — M79662 Pain in left lower leg: Secondary | ICD-10-CM | POA: Diagnosis present

## 2021-10-17 NOTE — Therapy (Signed)
[place] MAIN [facility] SERVICES [address] [place], [place], [address] Phone: [phone]   Fax:  [phone]  Physical Therapy Treatment  Patient Details  Name: Michelle Armstrong MRN: [id number] Date of Birth: April 23, 1961 Referring Provider (PT): Hildred Alamin PA   Encounter Date: 10/17/2021   PT End of Session - 10/17/21 0806     Visit Number 27    Number of Visits 32    Date for PT Re-Evaluation 10/31/21    Authorization Type Initial Eval 20/23/3435; Recert 68/61/6837-2/90/2111    Progress Note Due on Visit 30    PT Start Time 0800    PT Stop Time 0845    PT Time Calculation (min) 45 min    Equipment Utilized During Treatment Other (comment);Gait belt   SPC/ankle brace   Activity Tolerance Patient tolerated treatment well    Behavior During Therapy [facility] for tasks assessed/performed             Past Medical History:  Diagnosis Date   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 02/2010 Echo: EF 55-60%, no rwma.   Vitamin D deficiency    low    Past Surgical History:  Procedure Laterality Date   ABDOMINAL HYSTERECTOMY  1999   Total lap hyst BSO due to endometriosis   CARDIAC ELECTROPHYSIOLOGY MAPPING AND ABLATION  2011   COLONOSCOPY WITH PROPOFOL N/A 07/10/2015   Procedure: COLONOSCOPY WITH PROPOFOL;  Surgeon: Lollie Sails, MD;  Location: [facility] ENDOSCOPY;  Service: Endoscopy;  Laterality: N/A;   GALLBLADDER SURGERY     HERNIA REPAIR  12/2010   LEFT  HEART CATH AND CORONARY ANGIOGRAPHY N/A 02/24/2017   Procedure: Left Heart Cath and Coronary Angiography;  Surgeon: Wellington Hampshire, MD;  Location: [facility];  Service: Cardiovascular;  Laterality: N/A;   TONSILLECTOMY      There were no vitals filed for this visit.   Subjective Assessment - 10/17/21 0804     Subjective Pt reports no significant changes since last session. She is still reporting spasms and pain with lying down in bed but it is improving slightly with time and exposure.    Patient is accompained by: Family member    Pertinent History 61 yo Female s/p fall with subsequent closed non-displaced segmental fracture of left fibula on 05/17/21. Pt reports she sustained her initial injury when she was leaving a resturaunt. She reprots she was stepping onto an outdoor carpeted surface which had a unmarked, large indentation beneath the carpet which could not be noticed. When she stepped into this area, her ankle turned and resulted in the fracture and fall. She was casted for 6 weeks and has now been given a walking boot which she will wear for 6 weeks. MD order states, May gradually advance weight bearing as tolerated in the boot. Once full weightbearing, may wean out of boot She also  suffered an L1 compression fracture (25% loss of height). She reports orthopedic MD has not evaluated her ankle. She has a referral to [facility] to evaluate ankle later today. 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 As far as she is aware she has not had a clot at this point. She also has osteopenia. Pt is unable to don/doff boot independently due to difficulty bending over, husband helps her; Pt reports increased back pain with difficulty getting in/out of bed and has since been sleeping and living in a lift chair. PMH significant: Clotting disorder, HLD, hypothyroidism, Osteopenia, SVT, vitamin D deficiency    Limitations  Sitting;Lifting;Standing;Walking;House hold activities    Diagnostic tests 10/2: MRI of lumbar spine: Evolving subacute compression fracture involving the superior  endplate of L1 with mild 25% height loss and trace 2 mm bony  retropulsion. No associated stenosis.  2. Small left foraminal disc protrusion with annular fissure at  L4-5, closely approximating and potentially irritating the exiting  left L4 nerve root.  3. Mild-to-moderate facet hypertrophy at L3-4 through L5-S1. X-ray of LLE: 1.  Mildly displaced oblique extra-articular fracture of the proximal left fibular diaphysis.   2.  Nondisplaced left lateral malleolar fracture.   3.  There is no significant osteoarthrosis.    Patient Stated Goals Pt wants to be able to walk normally again without pain and with normalized gait pattern and return to previous level of function.    Currently in Pain? No/denies             INTERVENTIONS:    Therapeutic Exercises:  Nustep level 1 x 6 minutes LE only for improved LE stregnth and conditioning. -pain felt in low back with this activity today, SPM around 40 throughout.  Pt transitioned to SL, pt had pain and discomfort with this activity today and further exercises in this position will be held until a further date.   Seated ankle rocker board  -1 min with board turned each direction (a/p, lateral, diagonal (x2)) -some ankle pain noted, particularly with a/p and in the area surrounding heel and malleoli.   Manual STM to musculature, tendons and ligament surrounding achilles tendon and bilateral malleoli posteriorly, pt noted improved s/s following. A/P and P/A L Ankle joint mobilizations x multiple reps  Assessment of toes bilaterally, some restriction in L great toe but not functionally limiting restriction, other toe movements assessed ( second interphalangeal joint on second phalange) were WNL but had some inflammation along joint line along where CAM boot likely pressed.     Resistive Gait:  (matrix cable system) Forward x 3 at 12.5 lb- good reciprocal gait pattern Backward x 3 at 7.5 # *VC for technique- Patient responded well and able to walk with reciprocal steps.    The following activities were completed in parallel bars or at balance bar   Airex step ups with knee drive to promote L ankle stability 1 x 10 forward and lateral (leading with involved LE)  No UE support this session  Standing mini RDL (cane to knees)  -VC and visual cues for technique, improved form this session, pt felt in lower back (in target muscle area) but no pain in knees today.   Pt educated throughout session about proper posture and technique with exercises. Improved exercise technique, movement at target joints, use of target muscles after min to mod verbal, visual, tactile cues. Note: Portions of this document were prepared using Dragon voice  recognition software and although reviewed may contain unintentional dictation errors in syntax, grammar, or spelling.                            PT Education - 10/17/21 0850     Education Details Exercise form and technique    Person(s) Educated Patient    Methods Explanation    Comprehension Verbalized understanding              PT Short Term Goals - 09/24/21 0807       PT SHORT TERM GOAL #1   Title Patient will be independent in home exercise program to improve strength/mobility for better functional independence with ADLs.    Baseline 10/26: HEP given, Pt is independent and completes HEP regularly    Time 4    Period Weeks    Status Achieved    Target Date 08/08/21      PT SHORT TERM GOAL #2   Title Patient will report a worse VAS of 4/10 in LLE for improved tolerance of mobility and quality of life.    Baseline 4 of 10 in seated position but patient reports pain is up to 6-7 out of 10 at times. 09/05/2021= Patient reports pain level in ankle= 5/10 today- having more soreness due to increase use of ASO brace.     Time 4    Period Weeks    Status On-going    Target Date 10/03/21               PT Long Term Goals - 09/24/21 0807       PT LONG TERM GOAL #1   Title Patient will increase FOTO score to equal to or greater than 47%    to demonstrate statistically significant improvement in mobility and quality of life.    Baseline 10/26: 5%, 12/1:26.38%; 12/21= 40%    Time 8    Period Weeks    Status On-going    Target Date 10/31/21      PT LONG TERM GOAL #2   Title Patient will increase 10 meter walk test to >1.28ms as to improve gait speed for better community ambulation and to reduce fall risk.    Baseline 10/26: unable to ambulate at this time 12/1:.420m with RW,.4010mwith cane; 09/05/2021= 0.6 m/s using SPC    Time 8    Period Weeks    Status On-going    Target Date 09/27/21      PT LONG TERM GOAL #3   Title Patient will increase BLE gross strength to 4+/5 as to improve functional strength for independent gait, increased standing tolerance and increased ADL ability.    Baseline 10/26: see note, 12/1: Improved knee and hip strength to 4+/5 in seated position, ankle DF, EV and Inv still require strengthenigng, see flowsheets for more info; 12/22= Patient continues to present with 4+/5 Bilateral Hip flex/abd/knee ext/flex strength and improving ankle strength= 3+/5 ankle DF/PF/EV/IV.    Time 8    Period Weeks    Status Partially Met    Target Date 10/31/21      PT LONG TERM GOAL #4   Title Patient will tolerate weightbearing for >30 minutes for increased mobility and ambulation to increase indepenence with ADLs and iADLs    Baseline 10/26; able to tolerate approximately 15 minutes: 09/06/2021= Not directly tested in clinic today however patient and husband report that is she able to stand for 30 min at home.  Time 8    Period Weeks    Status Achieved      PT LONG TERM GOAL #5   Title Patient will increase ankle ROM to 10 degrees within normal range for improved gait mechanics and  reduced pain.    Baseline 10/26: see note, improved will all ROM since initial eval, see note for specific details: 09/06/2021= ROM measured at last visit 12/19 - has improved to within 10 deg of normal.    Time 8    Period Weeks    Status Achieved      PT LONG TERM GOAL #6   Title Pt will decrease worst low back pain as reported on NPRS by at least 2 points in order to demonstrate clinically significant reduction in back pain.    Baseline 08/08/2021= LBP at worst = 7/10; 09/05/2021= 5/10 current low back pain- Will keep goal active to ensure patient's pain consistently decreases.    Time 8    Period Weeks    Status On-going    Target Date 10/31/21      PT LONG TERM GOAL #7   Title Pt will decrease 5TSTS by at least 3 seconds in order to demonstrate clinically significant improvement in LE strength.    Baseline 09/06/2021= 21.45 sec without UE support    Time 8    Period Weeks    Status New    Target Date 10/31/21      PT LONG TERM GOAL #8   Title Pt will improve BERG by at least 3 points in order to demonstrate clinically significant improvement in balance.    Baseline 09/05/2021= 50/56    Time 8    Period Weeks    Status New    Target Date 10/31/21                   Plan - 10/17/21 0807     Clinical Impression Statement Pt presents with excellent motivation for completino of OT program this session. Pt did have some increased pain with nustep activity today with complaints low back pain. Pt was instructed she would be able to use UE on nustep next session to decrease pressure applied to low back with this activity. Pt did present to therapy without ASO brace this session. Overall she tolerated activities well without the brace but is having increased discomfort on the anterior aspect of her left ankle but improved discomfort on the lateral aspects of her ankle while ambulating. Pt provided with grade 2/3 mobilization of L ankle this session to improved tightness and  motion. Pt also demonstrated improved swelling posterior to her lateral maleoli this session compared to last session. Pt will continue to benefit from skilled PT intervention to improve her mobility, pain and QOL.    Personal Factors and Comorbidities Age;Comorbidity 3+;Past/Current Experience;Sex;Time since onset of injury/illness/exacerbation;Transportation    Comorbidities HLD, hypothyroidism, ostepenia, osteoporis, SVT, clotting disorder    Examination-Activity Limitations Bathing;Bed Mobility;Bend;Caring for [employer];Locomotion Level;Lift;Hygiene/Grooming;Squat;Stairs;Stand;Toileting;Transfers    Examination-Participation Restrictions Church;Cleaning;Community Activity;Driving;Interpersonal Relationship;Personal Finances;Meal Prep;Laundry;Shop;Volunteer;Yard Work;Occupation    Stability/Clinical Decision Making Evolving/Moderate complexity    Rehab Potential Good    PT Frequency 2x / week    PT Duration 8 weeks    PT Treatment/Interventions ADLs/Self Care Home Management;Aquatic Therapy;Biofeedback;Cryotherapy;Electrical Stimulation;Iontophoresis 35m/ml Dexamethasone;Moist Heat;Ultrasound;DME Instruction;Gait training;Stair training;Functional mobility training;Neuromuscular re-education;Cognitive remediation;Balance training;Therapeutic exercise;Therapeutic activities;Patient/family education;Orthotic Fit/Training;Manual techniques;Compression bandaging;Manual lymph drainage;Scar mobilization;Passive range of motion;Energy conservation;Splinting;Taping;Vasopneumatic Device;Visual/perceptual remediation/compensation    PT Next Visit Plan Continue to progress static/dynamic standing balance; LE/core/back  strengthening as appropriate.    PT Home Exercise Plan 09/12/2021=Access Code: [email or web address]  URL: [email or web address]    Consulted and Agree with Plan of Care Patient             Patient will benefit from skilled therapeutic intervention in  order to improve the following deficits and impairments:  Abnormal gait, Cardiopulmonary status limiting activity, Decreased activity tolerance, Decreased balance, Decreased knowledge of precautions, Decreased endurance, Decreased knowledge of use of DME, Decreased range of motion, Decreased mobility, Difficulty walking, Decreased strength, Hypomobility, Increased edema, Impaired flexibility, Impaired perceived functional ability, Increased muscle spasms, Pain  Visit Diagnosis: Abnormality of gait and mobility  Difficulty in walking, not elsewhere classified  Muscle weakness (generalized)  Chronic bilateral low back pain without sciatica     Problem List Patient Active Problem List   Diagnosis Date Noted   NSTEMI (non-ST elevated myocardial infarction) ([place]) 07/14/2021   Pulmonary nodule less than 6 mm determined by computed tomography of lung 07/14/2021   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 tract infection 04/19/2021   Hepatic hemangioma 06/02/2020   Constipation 06/02/2020   Left ankle sprain 03/25/2018   Foot injury, left, initial encounter 03/25/2018   Osteoporosis of lumbar spine 02/19/2018   Subcutaneous nodule 09/16/2017   Pain and swelling of lower leg 09/04/2017   Nevus 09/04/2017   Pain of both hip joints 06/03/2017   Need for hepatitis C screening test 05/22/2017   SVT s/p ablation 01/09/2010    Particia Lather, PT 10/17/2021, 9:01 AM  [place] MAIN [facility] SERVICES [address] [place], [place], [address] Phone: [phone]   Fax:  [phone]  Name: Michelle Armstrong MRN: [id number] Date of Birth: 1960-09-30

## 2021-10-23 ENCOUNTER — Other Ambulatory Visit: Payer: Self-pay | Admitting: Obstetrics and Gynecology

## 2021-10-23 DIAGNOSIS — Z1231 Encounter for screening mammogram for malignant neoplasm of breast: Secondary | ICD-10-CM

## 2021-10-25 ENCOUNTER — Ambulatory Visit: Payer: Managed Care, Other (non HMO) | Admitting: Physical Therapy

## 2021-10-25 ENCOUNTER — Telehealth: Payer: Self-pay | Admitting: Internal Medicine

## 2021-10-25 ENCOUNTER — Ambulatory Visit: Payer: Managed Care, Other (non HMO)

## 2021-10-25 NOTE — Telephone Encounter (Signed)
I was contacted by health information management regarding a request by Ms. Jowett to amend my progress note from 10/30/1/22.  She notes that atorvastatin was listed on her scheduled medications though she states that she does not "take Lipitor nor did I take it in the hospital."  Review of the [facility] indicates that atorvastatin was ordered in the setting of NSTEMI but the patient refused the medication.  Ms. Clapp also questions the statement of "lumbar compression left ankle fractures."  She states that her ankle was sprained and that she had two fractures of the left fibula.  She also disputes the documented history of obesity, stating that she was overweight for a few years after her hysterectomy but underwent gastric bypass.  Review of her chart is notable for a BMI of up to 36.6 in 04/2011, which is consistent with a diagnosis of obesity.  I have reassured her that she is not obese at this time based on her BMI.  I will add an addendum to my 07/16/2021 progress note to reflect this information though it would not be appropriate for me to remove atorvastatin as a scheduled medication or to remove the diagnosis of obesity status post bariatric surgery.  Nelva Bush, MD [facility]

## 2021-10-26 ENCOUNTER — Telehealth (INDEPENDENT_AMBULATORY_CARE_PROVIDER_SITE_OTHER): Payer: Managed Care, Other (non HMO) | Admitting: Family

## 2021-10-26 ENCOUNTER — Encounter: Payer: Self-pay | Admitting: Family

## 2021-10-26 VITALS — BP 98/68 | Temp 97.9°F | Wt 165.0 lb

## 2021-10-26 DIAGNOSIS — J039 Acute tonsillitis, unspecified: Secondary | ICD-10-CM | POA: Diagnosis not present

## 2021-10-26 DIAGNOSIS — J029 Acute pharyngitis, unspecified: Secondary | ICD-10-CM

## 2021-10-26 DIAGNOSIS — R051 Acute cough: Secondary | ICD-10-CM

## 2021-10-26 MED ORDER — AZITHROMYCIN 250 MG PO TABS: ORAL_TABLET | ORAL | 0 refills | Status: AC

## 2021-10-26 MED ORDER — PROMETHAZINE-DM 6.25-15 MG/5ML PO SYRP: ORAL_SOLUTION | ORAL | 0 refills | Status: DC

## 2021-10-26 NOTE — Progress Notes (Signed)
MyChart Video Visit    Virtual Visit via Video Note   This visit type was conducted due to national recommendations for restrictions regarding the COVID-19 Pandemic (e.g. social distancing) in an effort to limit this patient's exposure and mitigate transmission in our community. This patient is at least at moderate risk for complications without adequate follow up. This format is felt to be most appropriate for this patient at this time. Physical exam was limited by quality of the video and audio technology used for the visit. CMA was able to get the patient set up on a video visit.  Patient location: Home. Patient and provider in visit Provider location: Office  I discussed the limitations of evaluation and management by telemedicine and the availability of in person appointments. The patient expressed understanding and agreed to proceed.  Visit Date: 10/26/2021  Today's healthcare provider: Eugenia Pancoast, FNP     Subjective:    Patient ID: Michelle Armstrong, female    DOB: 1961/08/28, 61 y.o.   MRN: [id number]  Chief Complaint  Patient presents with   URI    Started about 5 days ago after seeing grandkids. Started with sore throat, headaches, chills, cough, body aches. Coughing up green phlegm. Covid test daily for 4 days. Had some clindamycin and has been taking it the last 2 days.     URI  Associated symptoms include congestion, coughing (green sputum), ear pain and a sore throat. Pertinent negatives include no chest pain or wheezing.   Pt here with c/o sore throat, headache, cough, and body aches. Coughing up green sputum.   Has tested daily for covid x 4 days and negative.  She does state her grand kids were just around them and were pretty sick and were positive for strep. Taking dayquil/nyquil and she states she had leftover clindamycin (150 mg taking four times daily) so started taking two days ago which has helped her. Not as sore, but still a bit scratchy.    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 02/2010 Echo: EF 55-60%, no rwma.   Vitamin D deficiency    low    Past Surgical History:  Procedure Laterality Date   ABDOMINAL HYSTERECTOMY  1999   Total lap hyst BSO due to endometriosis   CARDIAC ELECTROPHYSIOLOGY MAPPING AND ABLATION  2011   COLONOSCOPY WITH PROPOFOL N/A 07/10/2015   Procedure: COLONOSCOPY WITH PROPOFOL;  Surgeon: Lollie Sails, MD;  Location[facility] ENDOSCOPY;  Service: Endoscopy;  Laterality: N/A;   GALLBLADDER SURGERY     HERNIA REPAIR  12/2010   LEFT HEART CATH AND CORONARY ANGIOGRAPHY N/A 02/24/2017   Procedure: Left Heart Cath and Coronary Angiography;  Surgeon: Wellington Hampshire, MD;  Location: [facility];  Service: Cardiovascular;  Laterality: N/A;   TONSILLECTOMY      Family History  Problem  Relation Age of Onset   Heart disease Father        s/p CABG   Obesity Father    Hyperlipidemia Father    Bladder Cancer Father    Drug abuse Father    Thyroid disease Sister    Clotting disorder Sister        gene mutation   Breast cancer Neg Hx     Social History   Socioeconomic History   Marital status: Married    Spouse name: Not on file   Number of children: 1   Years of education: Not on file   Highest education level: Not on file  Occupational History   Occupation: customer service rep    Employer: [employer]  Tobacco Use   Smoking status: Never   Smokeless tobacco: Never    Tobacco comments:    tobacco use- no   Vaping Use   Vaping Use: Never used  Substance and Sexual Activity   Alcohol use: Yes    Alcohol/week: 0.0 standard drinks    Comment: rare glass of wine.   Drug use: No   Sexual activity: Yes    Birth control/protection: Post-menopausal  Other Topics Concern   Not on file  Social History Narrative   Lives locally with husband.  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   Social Determinants of Health   Financial Resource Strain: Not on file  Food Insecurity: Not on file  Transportation Needs: Not on file  Physical Activity: Not on file  Stress: Not on file  Social Connections: Not on file  Intimate Partner Violence: Not on file    Outpatient Medications Prior to Visit  Medication Sig Dispense Refill   Ascorbic Acid (VITAMIN C) 100 MG tablet Take 100 mg by mouth daily.     aspirin EC 81 MG tablet Take 81 mg by mouth daily.     B Complex-C-Folic Acid (SUPER B COMPLEX/FA/VIT C) TABS Take 1 tablet by mouth daily.     Calcium Carbonate 500 MG CHEW Chew 1 tablet by mouth daily.     calcium-vitamin D (OSCAL WITH D) 500-5 MG-MCG tablet Take 1 tablet by mouth daily with breakfast.     cholecalciferol (VITAMIN D) 1000 units tablet Take 1,000 Units by mouth daily.     Collagen Hydrolysate POWD 2 Scoops as directed.     ferrous sulfate 325 (65 FE) MG EC tablet Take 325 mg by mouth 3 (three) times daily with meals.     Multiple Vitamins-Minerals (MULTIVITAMIN PO) Take 1 tablet by mouth daily.     nitroGLYCERIN (NITROSTAT) 0.4 MG SL tablet Place 1 tablet (0.4 mg total) under the tongue every 5 (five) minutes as needed for chest pain. 25 tablet 1   Polyethylene Glycol 3350 (MIRALAX PO) Take 1 Scoop by mouth.     Probiotic Product (PROBIOTIC PO) Take by mouth.     Thiamine HCl (B-1 PO) Take 12.5 mg by mouth.     Wheat Dextrin (BENEFIBER PO) Take 2 Scoops by mouth.     tiZANidine (ZANAFLEX) 4 MG tablet Take 1 tablet (4 mg total) by mouth every 6  (six) hours as needed for muscle spasms. 60 tablet 0   Urea 47 % CREA Apply 1 application topically in the morning and at bedtime. 142 g 2   No facility-administered medications prior to visit.    Allergies  Allergen Reactions   Cephalexin     Rash & dizziness   Latex  rash   Metoprolol     REACTION: dizziness, slurred speech   Nsaids Other (See Comments)    ulcers ulcers   Penicillin G Itching   Pollen Extract     Review of Systems  Constitutional:  Positive for chills. Negative for fever.  HENT:  Positive for congestion, ear pain and sore throat. Negative for sinus pressure.   Respiratory:  Positive for cough (green sputum). Negative for shortness of breath and wheezing.   Cardiovascular:  Negative for chest pain and palpitations.  Musculoskeletal:  Positive for arthralgias (body aches).      Objective:    Physical Exam Constitutional:      Appearance: Normal appearance. She is normal weight.  Pulmonary:     Effort: Pulmonary effort is normal.  Neurological:     General: No focal deficit present.     Mental Status: She is alert and oriented to person, place, and time.  Psychiatric:        Mood and Affect: Mood normal.        Behavior: Behavior normal.        Thought Content: Thought content normal.        Judgment: Judgment normal.    BP 98/68    Temp 97.9 F (36.6 C)    Wt 165 lb (74.8 kg)    BMI 25.84 kg/m  Wt Readings from Last 3 Encounters:  10/26/21 165 lb (74.8 kg)  08/16/21 165 lb (74.8 kg)  07/15/21 178 lb 5.6 oz (80.9 kg)       Assessment & Plan:   Problem List Items Addressed This Visit       Respiratory   Tonsillitis - Primary    Suspected strep, will treat as such. Clindamycin, d/c and start zpack. Ibuprofen/tyelnol prn sore throat/fever Pt told to F/u if no improvement in the next 2-3 days.       Relevant Medications   azithromycin (ZITHROMAX) 250 MG tablet     Other   Acute cough    Cough medication sent to pharmacy.  Continue mucinex.       Relevant Medications   promethazine-dextromethorphan (PROMETHAZINE-DM) 6.25-15 MG/5ML syrup   Sore throat    Likely strep as exposed by grandkids.       I have discontinued Flint Melter Mcquillen "Cathy"'s tiZANidine and Urea. I am also having her start on azithromycin and promethazine-dextromethorphan. Additionally, I am having her maintain her Calcium Carbonate, Multiple Vitamins-Minerals (MULTIVITAMIN PO), cholecalciferol, aspirin EC, Super B Complex/FA/Vit C, Collagen Hydrolysate, nitroGLYCERIN, Thiamine HCl (B-1 PO), Probiotic Product (PROBIOTIC PO), Wheat Dextrin (BENEFIBER PO), Polyethylene Glycol 3350 (MIRALAX PO), ferrous sulfate, vitamin C, and calcium-vitamin D.  Meds ordered this encounter  Medications   azithromycin (ZITHROMAX) 250 MG tablet    Sig: Take 2 tablets on day 1, then 1 tablet daily on days 2 through 5    Dispense:  6 tablet    Refill:  0    Order Specific Question:   Supervising Provider    Answer:   BEDSOLE, AMY E [[id number]]   promethazine-dextromethorphan (PROMETHAZINE-DM) 6.25-15 MG/5ML syrup    Sig: Take 5 ml po Q6h prn cough    Dispense:  120 mL    Refill:  0    Order Specific Question:   Supervising Provider    Answer:   BEDSOLE, AMY E [[id number]]    I discussed the assessment and treatment plan with the patient. The patient was provided an opportunity to ask questions and all were answered. The patient agreed  with the plan and demonstrated an understanding of the instructions.   The patient was advised to call back or seek an in-person evaluation if the symptoms worsen or if the condition fails to improve as anticipated.  I provided 15 minutes of face-to-face time during this encounter.   Eugenia Pancoast, [place] [facility] [place] [phone] (phone) [phone] (fax)  [place]

## 2021-10-26 NOTE — Assessment & Plan Note (Signed)
Likely strep as exposed by grandkids.

## 2021-10-26 NOTE — Assessment & Plan Note (Signed)
Suspected strep, will treat as such. Clindamycin, d/c and start zpack. Ibuprofen/tyelnol prn sore throat/fever Pt told to F/u if no improvement in the next 2-3 days.

## 2021-10-26 NOTE — Patient Instructions (Signed)
You are suspected to have strep.  Take antibiotics that have been sent to the pharmacy.  Change your toothbrush after 24 hours on the antibiotics.  Gargle with warm salt water as needed for sore throat.

## 2021-10-26 NOTE — Assessment & Plan Note (Signed)
Cough medication sent to pharmacy. Continue mucinex.

## 2021-10-29 NOTE — Telephone Encounter (Signed)
Patient will need to follow-up with me in the office and will need to have two slots in order to discuss her concerns (40 minute encounter).  Thanks.  Nelva Bush, MD [facility]

## 2021-10-29 NOTE — Telephone Encounter (Signed)
Patient calling wants to follow up and discuss addendum, please assist.

## 2021-10-30 ENCOUNTER — Encounter: Payer: Self-pay | Admitting: Physical Therapy

## 2021-10-30 ENCOUNTER — Ambulatory Visit: Payer: Managed Care, Other (non HMO) | Admitting: Physical Therapy

## 2021-10-30 ENCOUNTER — Other Ambulatory Visit: Payer: Self-pay

## 2021-10-30 DIAGNOSIS — R269 Unspecified abnormalities of gait and mobility: Secondary | ICD-10-CM

## 2021-10-30 DIAGNOSIS — R262 Difficulty in walking, not elsewhere classified: Secondary | ICD-10-CM

## 2021-10-30 DIAGNOSIS — M79662 Pain in left lower leg: Secondary | ICD-10-CM

## 2021-10-30 NOTE — Telephone Encounter (Signed)
Patient is refusing office visit due to visit cost & requesting a call back to speak to office manager.

## 2021-10-30 NOTE — Therapy (Signed)
[place] [facility] SERVICES [address] [place], [place], [address] Phone: [phone]   Fax:  [phone]  Physical Therapy Treatment  Patient Details  Name: Michelle Armstrong MRN: [id number] Date of Birth: 05-Aug-1961 Referring Provider (PT): Hildred Alamin PA   Encounter Date: 10/30/2021   PT End of Session - 10/30/21 1042     Visit Number 28    Number of Visits 32    Date for PT Re-Evaluation 10/31/21    Authorization Type Initial Eval 35/32/9924; Recert [id number]/[id number]/[id number]/9796    Progress Note Due on Visit 30    PT Start Time 0801    PT Stop Time 0845    PT Time Calculation (min) 44 min    Equipment Utilized During Treatment Other (comment);Gait belt   SPC/ankle brace   Activity Tolerance Patient tolerated treatment well    Behavior During Therapy [facility] for tasks assessed/performed             Past Medical History:  Diagnosis Date   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 02/2010 Echo: EF 55-60%, no rwma.   Vitamin D deficiency    low    Past Surgical History:  Procedure Laterality Date   ABDOMINAL HYSTERECTOMY  1999   Total lap hyst BSO due to endometriosis   CARDIAC ELECTROPHYSIOLOGY MAPPING AND ABLATION  2011   COLONOSCOPY WITH PROPOFOL N/A 07/10/2015   Procedure: COLONOSCOPY WITH PROPOFOL;  Surgeon: Lollie Sails, MD;  Location: [facility] ENDOSCOPY;  Service: Endoscopy;  Laterality: N/A;   GALLBLADDER SURGERY     HERNIA REPAIR  12/2010   LEFT  HEART CATH AND CORONARY ANGIOGRAPHY N/A 02/24/2017   Procedure: Left Heart Cath and Coronary Angiography;  Surgeon: Wellington Hampshire, MD;  Location: [facility] LAB;  Service: Cardiovascular;  Laterality: N/A;   TONSILLECTOMY      There were no vitals filed for this visit.   Subjective Assessment - 10/30/21 0805     Subjective Pt reports improvement in s/s since last session with her ability to sleep in bed. She reports she is able to sleep for approximately 3 hours prior to waking up with some pain and discomfort. Following this she is able to fall back asleep for a total of 6 hours but she is not comfortable during this time. Pt also reports trouble with stair navigation when utilizing step over step pattern.    Patient is accompained by: Family member    Pertinent History 61 yo Female s/p fall with subsequent closed non-displaced segmental fracture of left fibula on 05/17/21. Pt reports she sustained her initial injury when she was leaving a resturaunt. She reprots she was stepping onto an outdoor carpeted surface which had a unmarked, large indentation beneath the carpet which could not be noticed. When she stepped into this area, her ankle turned and resulted in the fracture and fall. She was  casted for 6 weeks and has now been given a walking boot which she will wear for 6 weeks. MD order states, May gradually advance weight bearing as tolerated in the boot. Once full weightbearing, may wean out of boot She also suffered an L1 compression fracture (25% loss of height). She reports orthopedic MD has not evaluated her ankle. She has a referral to [facility] to evaluate ankle later today. 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 As far as she is aware she has not had a clot at this point. She also has osteopenia. Pt is unable to don/doff boot independently due to difficulty bending over, husband helps her; Pt reports increased back pain with  difficulty getting in/out of bed and has since been sleeping and living in a lift chair. PMH significant: Clotting disorder, HLD, hypothyroidism, Osteopenia, SVT, vitamin D deficiency    Limitations Sitting;Lifting;Standing;Walking;House hold activities    Diagnostic tests 10/2: MRI of lumbar spine: Evolving subacute compression fracture involving the superior  endplate of L1 with mild 25% height loss and trace 2 mm bony  retropulsion. No associated stenosis.  2. Small left foraminal disc protrusion with annular fissure at  L4-5, closely approximating and potentially irritating the exiting  left L4 nerve root.  3. Mild-to-moderate facet hypertrophy at L3-4 through L5-S1. X-ray of LLE: 1.  Mildly displaced oblique extra-articular fracture of the proximal left fibular diaphysis.   2.  Nondisplaced left lateral malleolar fracture.   3.  There is no significant osteoarthrosis.    Patient Stated Goals Pt wants to be able to walk normally again without pain and with normalized gait pattern and return to previous level of function.             INTERVENTIONS:    Therapeutic Exercises:  Nustep level 1 x 6 minutes LE and UE odue to back pain experienced due to pressure from machine last session-pain felt in low back again with this activity today, SPM around 40 throughout.   Manual STM to musculature, tendons and ligament surrounding achilles tendon and bilateral malleoli posteriorly, pt has noted continual improvement in s/s following therapy session from this intervention. A/P and P/A L Ankle joint mobilizations to talocrural jount as well as tarsometatarsal joints x multiple reps, some adjustments made for PT hand positioning throughout due to pressure from hand placement   Seated LAQ: 2 x 10 with 4# AW, no pain experienced with this, pt provided with handout for this exercise with resistance band at home.   Step assessment: completed 2 rounds one with step to pattern and one with step over step  pattern. Some decreased eccentric control with descending with step over step pattern.   Step up exercise: 1 x 10, without UE assist -provided with handout for this activity to practice at home. Instructed she could utilize UE at home to ease stress on lower back.   Pt educated throughout session about proper posture and technique with exercises. Improved exercise technique, movement at target joints, use of target muscles after min to mod verbal, visual, tactile cues. Note: Portions of this document were prepared using Dragon voice recognition software and although reviewed may contain unintentional dictation errors in syntax, grammar, or spelling.                              PT Education - 10/30/21 1041     Education  Details Plan of care moving forward    Person(s) Educated Patient    Methods Explanation    Comprehension Verbalized understanding              PT Short Term Goals - 09/24/21 0807       PT SHORT TERM GOAL #1   Title Patient will be independent in home exercise program to improve strength/mobility for better functional independence with ADLs.    Baseline 10/26: HEP given, Pt is independent and completes HEP regularly    Time 4    Period Weeks    Status Achieved    Target Date 08/08/21      PT SHORT TERM GOAL #2   Title Patient will report a worse VAS of 4/10 in LLE for improved tolerance of mobility and quality of life.    Baseline 4 of 10 in seated position but patient reports pain is up to 6-7 out of 10 at times. 09/05/2021= Patient reports pain level in ankle= 5/10 today- having more soreness due to increase use of ASO brace.    Time 4    Period Weeks    Status On-going    Target Date 10/03/21               PT Long Term Goals - 09/24/21 0807       PT LONG TERM GOAL #1   Title Patient will increase FOTO score to equal to or greater than 47%    to demonstrate statistically significant improvement in mobility and quality of  life.    Baseline 10/26: 5%, 12/1:26.38%; 12/21= 40%    Time 8    Period Weeks    Status On-going    Target Date 10/31/21      PT LONG TERM GOAL #2   Title Patient will increase 10 meter walk test to >1.60ms as to improve gait speed for better community ambulation and to reduce fall risk.    Baseline 10/26: unable to ambulate at this time 12/1:.457m with RW,.402mwith cane; 09/05/2021= 0.6 m/s using SPC    Time 8    Period Weeks    Status On-going    Target Date 09/27/21      PT LONG TERM GOAL #3   Title Patient will increase BLE gross strength to 4+/5 as to improve functional strength for independent gait, increased standing tolerance and increased ADL ability.    Baseline 10/26: see note, 12/1: Improved knee and hip strength to 4+/5 in seated position, ankle DF, EV and Inv still require strengthenigng, see flowsheets for more info; 12/22= Patient continues to present with 4+/5 Bilateral Hip flex/abd/knee ext/flex strength and improving ankle strength= 3+/5 ankle DF/PF/EV/IV.    Time 8    Period Weeks    Status Partially Met    Target Date 10/31/21      PT LONG TERM GOAL #4   Title Patient will tolerate weightbearing for >30 minutes for increased mobility and ambulation to increase indepenence with ADLs and iADLs    Baseline 10/26; able to tolerate approximately 15 minutes: 09/06/2021= Not directly tested in clinic today however patient and husband report that is she able to stand for 30 min at home.    Time 8    Period Weeks    Status Achieved      PT LONG TERM GOAL #5   Title Patient will increase ankle ROM to 10 degrees within normal range for improved gait mechanics and reduced pain.    Baseline 10/26: see  note, improved will all ROM since initial eval, see note for specific details: 09/06/2021= ROM measured at last visit 12/19 - has improved to within 10 deg of normal.    Time 8    Period Weeks    Status Achieved      PT LONG TERM GOAL #6   Title Pt will decrease worst  low back pain as reported on NPRS by at least 2 points in order to demonstrate clinically significant reduction in back pain.    Baseline 08/08/2021= LBP at worst = 7/10; 09/05/2021= 5/10 current low back pain- Will keep goal active to ensure patient's pain consistently decreases.    Time 8    Period Weeks    Status On-going    Target Date 10/31/21      PT LONG TERM GOAL #7   Title Pt will decrease 5TSTS by at least 3 seconds in order to demonstrate clinically significant improvement in LE strength.    Baseline 09/06/2021= 21.45 sec without UE support    Time 8    Period Weeks    Status New    Target Date 10/31/21      PT LONG TERM GOAL #8   Title Pt will improve BERG by at least 3 points in order to demonstrate clinically significant improvement in balance.    Baseline 09/05/2021= 50/56    Time 8    Period Weeks    Status New    Target Date 10/31/21                   Plan - 10/30/21 1042     Clinical Impression Statement Pt presents with excellent motivation for completino of PT program this session. Pt coninued increased pain with nustep activity today with complaints low back pain and is recommended by physical therapist we hold this exercise in the future as a result..Pt did present to therapy without ASO brace this session and with the exception of some soreness and discomfort has overall been tolerating transitioning out of the ASO brace well.  Physical therapy discussed discussed continuation of treatment during today's session and patient is agreeable with plan.  Next session patient is due for recertification note in her goals with will formally be assessed at this time.  Patient is doing much better with sleeping in bed at night but does still have some issues with sleeping through the night without increased pain and discomfort.  Pt will continue to benefit from skilled PT intervention to improve her mobility, pain and QOL.    Personal Factors and Comorbidities  Age;Comorbidity 3+;Past/Current Experience;Sex;Time since onset of injury/illness/exacerbation;Transportation    Comorbidities HLD, hypothyroidism, ostepenia, osteoporis, SVT, clotting disorder    Examination-Activity Limitations Bathing;Bed Mobility;Bend;Caring for [employer];Locomotion Level;Lift;Hygiene/Grooming;Squat;Stairs;Stand;Toileting;Transfers    Examination-Participation Restrictions Church;Cleaning;Community Activity;Driving;Interpersonal Relationship;Personal Finances;Meal Prep;Laundry;Shop;Volunteer;Yard Work;Occupation    Stability/Clinical Decision Making Evolving/Moderate complexity    Rehab Potential Good    PT Frequency 2x / week    PT Duration 8 weeks    PT Treatment/Interventions ADLs/Self Care Home Management;Aquatic Therapy;Biofeedback;Cryotherapy;Electrical Stimulation;Iontophoresis 49m/ml Dexamethasone;Moist Heat;Ultrasound;DME Instruction;Gait training;Stair training;Functional mobility training;Neuromuscular re-education;Cognitive remediation;Balance training;Therapeutic exercise;Therapeutic activities;Patient/family education;Orthotic Fit/Training;Manual techniques;Compression bandaging;Manual lymph drainage;Scar mobilization;Passive range of motion;Energy conservation;Splinting;Taping;Vasopneumatic Device;Visual/perceptual remediation/compensation    PT Next Visit Plan Continue to progress static/dynamic standing balance; LE/core/back strengthening as appropriate.    PT Home Exercise Plan Added step ups and LAq to HEP this session    Consulted and Agree with Plan of Care Patient  Patient will benefit from skilled therapeutic intervention in order to improve the following deficits and impairments:  Abnormal gait, Cardiopulmonary status limiting activity, Decreased activity tolerance, Decreased balance, Decreased knowledge of precautions, Decreased endurance, Decreased knowledge of use of DME, Decreased range of motion,  Decreased mobility, Difficulty walking, Decreased strength, Hypomobility, Increased edema, Impaired flexibility, Impaired perceived functional ability, Increased muscle spasms, Pain  Visit Diagnosis: Abnormality of gait and mobility  Difficulty in walking, not elsewhere classified  Pain in left lower leg     Problem List Patient Active Problem List   Diagnosis Date Noted   Tonsillitis 10/26/2021   Sore throat 10/26/2021   NSTEMI (non-ST elevated myocardial infarction) ([place]) 07/14/2021   Pulmonary nodule less than 6 mm determined by computed tomography of lung 07/14/2021   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 07/14/2021   Closed compression fracture of body of L1 vertebra ([place]) 06/11/2021   Cervical radiculopathy 06/11/2021   Fibula fracture 06/11/2021   Acute cough 09/14/2020   Hepatic hemangioma 06/02/2020   Constipation 06/02/2020   Left ankle sprain 03/25/2018   Foot injury, left, initial encounter 03/25/2018   Osteoporosis of lumbar spine 02/19/2018   Subcutaneous nodule 09/16/2017   Pain and swelling of lower leg 09/04/2017   Nevus 09/04/2017   Pain of both hip joints 06/03/2017   Need for hepatitis C screening test 05/22/2017   SVT s/p ablation 01/09/2010    Particia Lather, PT 10/30/2021, 10:58 AM  [place] [address] [place], [place], [address] Phone: [phone]   Fax:  [phone]  Name: Michelle Armstrong MRN: [id number] Date of Birth: 30-Mar-1961

## 2021-11-01 ENCOUNTER — Ambulatory Visit: Payer: Managed Care, Other (non HMO)

## 2021-11-01 ENCOUNTER — Other Ambulatory Visit: Payer: Self-pay | Admitting: Obstetrics and Gynecology

## 2021-11-01 ENCOUNTER — Ambulatory Visit: Payer: Managed Care, Other (non HMO) | Admitting: Physical Therapy

## 2021-11-01 ENCOUNTER — Telehealth: Payer: Self-pay | Admitting: Obstetrics and Gynecology

## 2021-11-01 ENCOUNTER — Other Ambulatory Visit: Payer: Self-pay

## 2021-11-01 DIAGNOSIS — Z131 Encounter for screening for diabetes mellitus: Secondary | ICD-10-CM

## 2021-11-01 DIAGNOSIS — R269 Unspecified abnormalities of gait and mobility: Secondary | ICD-10-CM

## 2021-11-01 DIAGNOSIS — M79662 Pain in left lower leg: Secondary | ICD-10-CM

## 2021-11-01 DIAGNOSIS — Z1329 Encounter for screening for other suspected endocrine disorder: Secondary | ICD-10-CM

## 2021-11-01 DIAGNOSIS — D649 Anemia, unspecified: Secondary | ICD-10-CM

## 2021-11-01 DIAGNOSIS — Z1321 Encounter for screening for nutritional disorder: Secondary | ICD-10-CM

## 2021-11-01 DIAGNOSIS — Z13220 Encounter for screening for lipoid disorders: Secondary | ICD-10-CM

## 2021-11-01 DIAGNOSIS — M6281 Muscle weakness (generalized): Secondary | ICD-10-CM

## 2021-11-01 DIAGNOSIS — R262 Difficulty in walking, not elsewhere classified: Secondary | ICD-10-CM

## 2021-11-01 DIAGNOSIS — Z0000 Encounter for general adult medical examination without abnormal findings: Secondary | ICD-10-CM

## 2021-11-01 NOTE — Telephone Encounter (Signed)
She was adamant that she could go ahead and have her annual.  She said that she didn't need to wait a year and a day.  She had called her insurance and she could do it earlier.  Michelle Armstrong had me put it in as a gyn visit, bc the system would not allow me to "duplicate".

## 2021-11-01 NOTE — Therapy (Signed)
[place] MAIN [facility] SERVICES [address] [place], [place], [address] Phone: [phone]   Fax:  [phone]  Physical Therapy Treatment/Recertification for dates 11/01/2021- 11/29/2021  Patient Details  Name: Michelle Armstrong MRN: [id number] Date of Birth: [birth year] Referring Provider (PT): Hildred Alamin PA   Encounter Date: 11/01/2021   PT End of Session - 11/01/21 0852     Visit Number 29    Number of Visits 33    Date for PT Re-Evaluation 10/31/21    Authorization Type Initial Eval 38/75/6433; Recert 29/51/8841-6/60/6301    Progress Note Due on Visit 30    PT Start Time 0800    PT Stop Time 0843    PT Time Calculation (min) 43 min    Equipment Utilized During Treatment Other (comment);Gait belt   SPC/ankle brace   Activity Tolerance Patient tolerated treatment well    Behavior During Therapy [facility] for tasks assessed/performed             Past Medical History:  Diagnosis Date   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 02/2010 Echo: EF 55-60%, no rwma.   Vitamin D deficiency    low    Past Surgical History:  Procedure Laterality Date   ABDOMINAL HYSTERECTOMY  1999   Total lap hyst BSO due to endometriosis   CARDIAC ELECTROPHYSIOLOGY MAPPING AND ABLATION  2011   COLONOSCOPY WITH PROPOFOL N/A 07/10/2015   Procedure: COLONOSCOPY WITH PROPOFOL;  Surgeon: Lollie Sails, MD;  Location: [facility] ENDOSCOPY;  Service: Endoscopy;  Laterality: N/A;    GALLBLADDER SURGERY     HERNIA REPAIR  12/2010   LEFT HEART CATH AND CORONARY ANGIOGRAPHY N/A 02/24/2017   Procedure: Left Heart Cath and Coronary Angiography;  Surgeon: Wellington Hampshire, MD;  Location: [facility] LAB;  Service: Cardiovascular;  Laterality: N/A;   TONSILLECTOMY      There were no vitals filed for this visit.   Subjective Assessment - 11/01/21 0855     Subjective Pt reports improvement overall throughout her rehab experience- stating she is walking without a cane; continuing to attempt sleeping in bed-approx- 3-6 hrs/night but reports limited by back and knee pain. She states she has made lots of progress since initiating PT and states generally feels better but still limited in some aspects with continued report of 4/10 low back pain and ongoing Left knee/ankle pain.    Patient is accompained by: Family member    Pertinent History 61 yo Female s/p fall with subsequent closed non-displaced segmental fracture of left fibula on 05/17/21. Pt reports she sustained her initial injury when she was leaving a resturaunt. She reprots she was stepping onto an outdoor carpeted surface which had a unmarked, large indentation beneath the carpet which could not be noticed. When she stepped into this area, her ankle turned and resulted in the fracture and fall. She was casted  for 6 weeks and has now been given a walking boot which she will wear for 6 weeks. MD order states, May gradually advance weight bearing as tolerated in the boot. Once full weightbearing, may wean out of boot She also suffered an L1 compression fracture (25% loss of height). She reports orthopedic MD has not evaluated her ankle. She has a referral to [facility] to evaluate ankle later today. 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 As far as she is aware she has not had a clot at this point. She also has osteopenia. Pt is unable to don/doff boot independently due to  difficulty bending over, husband helps her; Pt reports increased back pain with difficulty getting in/out of bed and has since been sleeping and living in a lift chair. PMH significant: Clotting disorder, HLD, hypothyroidism, Osteopenia, SVT, vitamin D deficiency    Limitations Sitting;Lifting;Standing;Walking;House hold activities    Diagnostic tests 10/2: MRI of lumbar spine: Evolving subacute compression fracture involving the superior  endplate of L1 with mild 25% height loss and trace 2 mm bony  retropulsion. No associated stenosis.  2. Small left foraminal disc protrusion with annular fissure at  L4-5, closely approximating and potentially irritating the exiting  left L4 nerve root.  3. Mild-to-moderate facet hypertrophy at L3-4 through L5-S1. X-ray of LLE: 1.  Mildly displaced oblique extra-articular fracture of the proximal left fibular diaphysis.   2.  Nondisplaced left lateral malleolar fracture.   3.  There is no significant osteoarthrosis.    Patient Stated Goals Pt wants to be able to walk normally again without pain and with normalized gait pattern and return to previous level of function.    Currently in Pain? Yes   Patient reports continued low back (4/10) and some left knee/ankle pain.   Pain Descriptors / Indicators Aching;Sore    Pain Type Chronic pain    Pain Onset More than a month ago    Pain Frequency Constant    Aggravating Factors  Laying in bed for prolonged time, Prolonged sitting, standing    Pain Relieving Factors Rest, change position    Effect of Pain on Daily Activities limited ability to sleep well in bed; Decreased activity level            INTERVENTIONS:   Reassessed all  Long term goals for recert visit    [facility] PT Assessment - 11/02/21 0724       Berg Balance Test   Sit to Stand Able to stand without using hands and stabilize independently    Standing Unsupported Able to stand safely 2 minutes    Sitting with Back Unsupported but Feet Supported on  Floor or Stool Able to sit safely and securely 2 minutes    Stand to Sit Sits safely with minimal use of hands    Transfers Able to transfer safely, minor use of hands    Standing Unsupported with Eyes Closed Able to stand 10 seconds safely    Standing Unsupported with Feet Together Able to place feet together independently and stand 1 minute safely    From Standing, Reach Forward with Outstretched Arm Can reach confidently >25 cm (10")    From Standing Position, Pick up Object from Floor Able to pick up shoe safely and easily    From Standing Position, Turn to Look Behind Over each Shoulder Looks behind from both sides and weight shifts well    Turn 360 Degrees  Able to turn 360 degrees safely in 4 seconds or less    Standing Unsupported, Alternately Place Feet on Step/Stool Able to stand independently and safely and complete 8 steps in 20 seconds    Standing Unsupported, One Foot in Front Able to place foot tandem independently and hold 30 seconds    Standing on One Leg Able to lift leg independently and hold > 10 seconds    Total Score 56           FOTO= 65% (improved from initial 5%) - GOAL MET  10 meter walk test= 1.1 m/s (gait velocity) without an Assistive device (improved from 0.45 m/s on 08/16/2021)- GOAL MET  Manual muscle testing= 4+/5 left LE - hip flex/abd/knee ext/flex; left ankle DF, PF, IV- GOAL MET   Low back pain goal= 4/10 (improved from 7/10) - GOAL MET  5 time Sit To Stand Test= 13.43 sec without UE support (improved from 21.45 sec) - GOAL MET    BERG BALANCE TEST= 56/56    *Added new goal for cert  Patient will transition from skilled PT services to self care including finalized home program for optimal functional endurance.                   PT Education - 11/01/21 2114     Education Details Plan discussed regarding recertification for 4 more weeks as needed. Exercise technique    Person(s) Educated Patient    Methods  Explanation;Demonstration;Verbal cues    Comprehension Verbalized understanding;Verbal cues required;Returned demonstration;Need further instruction              PT Short Term Goals - 09/24/21 6503       PT SHORT TERM GOAL #1   Title Patient will be independent in home exercise program to improve strength/mobility for better functional independence with ADLs.    Baseline 10/26: HEP given, Pt is independent and completes HEP regularly    Time 4    Period Weeks    Status Achieved    Target Date 08/08/21      PT SHORT TERM GOAL #2   Title Patient will report a worse VAS of 4/10 in LLE for improved tolerance of mobility and quality of life.    Baseline 4 of 10 in seated position but patient reports pain is up to 6-7 out of 10 at times. 09/05/2021= Patient reports pain level in ankle= 5/10 today- having more soreness due to increase use of ASO brace.    Time 4    Period Weeks    Status On-going    Target Date 10/03/21               PT Long Term Goals - 11/01/21 2125       PT LONG TERM GOAL #1   Title Patient will increase FOTO score to equal to or greater than 47%    to demonstrate statistically significant improvement in mobility and quality of life.    Baseline 10/26: 5%, 12/1:26.38%; 12/21= 40%; 11/01/2021=65%    Time 8    Period Weeks    Status Achieved    Target Date 10/31/21      PT LONG TERM GOAL #2   Title Patient will increase 10 meter walk test to >1.91ms as to improve gait speed for better community ambulation and to reduce fall risk.    Baseline 10/26: unable to ambulate at this time 12/1:.437m with RW,.4041mwith cane; 09/05/2021= 0.6 m/s using SPC; 11/01/2021= 1.1 m/s without AD  Time 8    Period Weeks    Status Achieved    Target Date 09/27/21      PT LONG TERM GOAL #3   Title Patient will increase BLE gross strength to 4+/5 as to improve functional strength for independent gait, increased standing tolerance and increased ADL ability.    Baseline  10/26: see note, 12/1: Improved knee and hip strength to 4+/5 in seated position, ankle DF, EV and Inv still require strengthenigng, see flowsheets for more info; 12/22= Patient continues to present with 4+/5 Bilateral Hip flex/abd/knee ext/flex strength and improving ankle strength= 3+/5 ankle DF/PF/EV/IV.  11/01/2021= 4+/5 left LE - hip flex/abd/knee ext/flex; left ankle DF, PF, IV    Time 8    Period Weeks    Status Achieved    Target Date 10/31/21      PT LONG TERM GOAL #4   Title Patient will tolerate weightbearing for >30 minutes for increased mobility and ambulation to increase indepenence with ADLs and iADLs    Baseline 10/26; able to tolerate approximately 15 minutes: 09/06/2021= Not directly tested in clinic today however patient and husband report that is she able to stand for 30 min at home.    Time 8    Period Weeks    Status Achieved      PT LONG TERM GOAL #5   Title Patient will increase ankle ROM to 10 degrees within normal range for improved gait mechanics and reduced pain.    Baseline 10/26: see note, improved will all ROM since initial eval, see note for specific details: 09/06/2021= ROM measured at last visit 12/19 - has improved to within 10 deg of normal.    Time 8    Period Weeks    Status Achieved      Additional Long Term Goals   Additional Long Term Goals Yes      PT LONG TERM GOAL #6   Title Pt will decrease worst low back pain as reported on NPRS by at least 2 points in order to demonstrate clinically significant reduction in back pain.    Baseline 08/08/2021= LBP at worst = 7/10; 09/05/2021= 5/10 current low back pain- Will keep goal active to ensure patient's pain consistently decreases. 11/01/2021= 4/10    Time 8    Period Weeks    Status Achieved    Target Date 10/31/21      PT LONG TERM GOAL #7   Title Pt will decrease 5TSTS by at least 3 seconds in order to demonstrate clinically significant improvement in LE strength.    Baseline 09/06/2021= 21.45 sec  without UE support; 11/01/2021=13.43 sec without UE support    Time 8    Period Weeks    Status Achieved    Target Date 10/31/21      PT LONG TERM GOAL #8   Title Pt will improve BERG by at least 3 points in order to demonstrate clinically significant improvement in balance.    Baseline 09/05/2021= 50/56; 11/01/2021= 56/56    Time 8    Period Weeks    Status Achieved    Target Date 10/31/21      PT LONG TERM GOAL  #9   TITLE Patient will transition from skilled PT services to self care including finalized home program for optimal functional endurance.    Baseline 11/01/2021- Patient actively participating in skilled PT setting and compliant with current HEP. She will benefit from continued training for updated HEP to set her up for optimal success  at home.    Time 4    Period Weeks    Status New    Target Date 11/29/21                   Plan - 11/01/21 2122     Clinical Impression Statement Patient presents today with excellent motivation and agreeable to reassess all remaining long term goals. She excelled today during testing- meeting several goals including FOTO (improved self perceived functional abilities); BERG - demonstrating a perfect score and much improved static and dynamic balance; improved strength as seen in 5x STS test and manual muscle testing; and improved gait velocity as seen by improved 10 meter walk test- Despite all the functional progress she does continue to be limited by ongoing  pain (although improved)  in mid/low back, left knee and ankle- limiting her  including prolonged sitting at desk, laying down in bed for comfort with sleeping and returning to her previous level of function before injury. Patient's condition has the potential to improve in response to therapy. Maximum improvement is yet to be obtained. The anticipated improvement is attainable and reasonable in a generally predictable time.  The remaining plan will be to instruct in home based  exercises so patient can continue to improve her strength and overall dynamic balance and stabilization on left ankle and discharge to a home program when appropriate. The plan will be to recertify patient for 1x/week for 4 more weeks as needed. Patient in agreement with plan    Personal Factors and Comorbidities Age;Comorbidity 3+;Past/Current Experience;Sex;Time since onset of injury/illness/exacerbation;Transportation    Comorbidities HLD, hypothyroidism, ostepenia, osteoporis, SVT, clotting disorder    Examination-Activity Limitations Bathing;Bed Mobility;Bend;Caring for [employer];Locomotion Level;Lift;Hygiene/Grooming;Squat;Stairs;Stand;Toileting;Transfers    Examination-Participation Restrictions Church;Cleaning;Community Activity;Driving;Interpersonal Relationship;Personal Finances;Meal Prep;Laundry;Shop;Volunteer;Yard Work;Occupation    Stability/Clinical Decision Making Evolving/Moderate complexity    Rehab Potential Good    PT Frequency 1x / week    PT Duration 4 weeks    PT Treatment/Interventions ADLs/Self Care Home Management;Aquatic Therapy;Biofeedback;Cryotherapy;Electrical Stimulation;Iontophoresis 23m/ml Dexamethasone;Moist Heat;Ultrasound;DME Instruction;Gait training;Stair training;Functional mobility training;Neuromuscular re-education;Cognitive remediation;Balance training;Therapeutic exercise;Therapeutic activities;Patient/family education;Orthotic Fit/Training;Manual techniques;Compression bandaging;Manual lymph drainage;Scar mobilization;Passive range of motion;Energy conservation;Splinting;Taping;Vasopneumatic Device;Visual/perceptual remediation/compensation    PT Next Visit Plan Continue to progress static/dynamic standing balance; LE/core/back strengthening as appropriate. Instruct and provide update HEP    PT Home Exercise Plan Added SLS, tandem stance, calf stretch at steps    Consulted and Agree with Plan of Care Patient              Patient will benefit from skilled therapeutic intervention in order to improve the following deficits and impairments:  Abnormal gait, Cardiopulmonary status limiting activity, Decreased activity tolerance, Decreased balance, Decreased knowledge of precautions, Decreased endurance, Decreased knowledge of use of DME, Decreased range of motion, Decreased mobility, Difficulty walking, Decreased strength, Hypomobility, Increased edema, Impaired flexibility, Impaired perceived functional ability, Increased muscle spasms, Pain  Visit Diagnosis: Abnormality of gait and mobility  Muscle weakness (generalized)  Difficulty in walking, not elsewhere classified  Pain in left lower leg     Problem List Patient Active Problem List   Diagnosis Date Noted   Tonsillitis 10/26/2021   Sore throat 10/26/2021   NSTEMI (non-ST elevated myocardial infarction) ([place] 07/14/2021   Pulmonary nodule less than 6 mm determined by computed tomography of lung 07/14/2021   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 07/14/2021   Closed compression fracture of body of L1 vertebra ([place] 06/11/2021   Cervical radiculopathy 06/11/2021   Fibula  fracture 06/11/2021   Acute cough 09/14/2020   Hepatic hemangioma 06/02/2020   Constipation 06/02/2020   Left ankle sprain 03/25/2018   Foot injury, left, initial encounter 03/25/2018   Osteoporosis of lumbar spine 02/19/2018   Subcutaneous nodule 09/16/2017   Pain and swelling of lower leg 09/04/2017   Nevus 09/04/2017   Pain of both hip joints 06/03/2017   Need for hepatitis C screening test 05/22/2017   SVT s/p ablation 01/09/2010    Lewis Moccasin, PT 11/02/2021, 7:38 AM  [place], [place], [address] Phone: [phone]   Fax:  [phone]  Name: Michelle Armstrong MRN: [id number] Date of Birth: 09-30-1960

## 2021-11-01 NOTE — Telephone Encounter (Signed)
Pt is scheduled to come for her Annual (Gyn visit) on March 16.  Will you please put in orders for blood work so that she can discuss them with you at her appointment?

## 2021-11-01 NOTE — Telephone Encounter (Deleted)
She was adamant that she could go ahead and have her annual.  She said that she didn't need to wait a year and a day.  She had called her insurance and she could do it earlier.  Michelle Armstrong had me put it in as a gyn visit, bc the system would not allow me to "duplicate".

## 2021-11-01 NOTE — Telephone Encounter (Signed)
Her annual isn't due till 5/23. I'll place orders but pt should have them in 4/23 (that's when done last yr)

## 2021-11-01 NOTE — Progress Notes (Signed)
Yearly lab orders due to bariatric surg

## 2021-11-05 ENCOUNTER — Ambulatory Visit: Payer: Managed Care, Other (non HMO)

## 2021-11-08 ENCOUNTER — Ambulatory Visit: Payer: Managed Care, Other (non HMO)

## 2021-11-08 ENCOUNTER — Other Ambulatory Visit: Payer: Self-pay

## 2021-11-08 DIAGNOSIS — M6281 Muscle weakness (generalized): Secondary | ICD-10-CM

## 2021-11-08 DIAGNOSIS — G8929 Other chronic pain: Secondary | ICD-10-CM

## 2021-11-08 DIAGNOSIS — R269 Unspecified abnormalities of gait and mobility: Secondary | ICD-10-CM | POA: Diagnosis not present

## 2021-11-08 DIAGNOSIS — R262 Difficulty in walking, not elsewhere classified: Secondary | ICD-10-CM

## 2021-11-08 NOTE — Therapy (Addendum)
[place] MAIN [facility] SERVICES [address] [place], [place], [address] Phone: [phone]   Fax:  [phone]  Physical Therapy Treatment/Physical Therapy Progress Note   Dates of reporting period 09/24/2021   to   11/08/2021  Patient Details  Name: Michelle Armstrong MRN: [id number] Date of Birth: [birth year] Referring Provider (PT): Hildred Alamin PA   Encounter Date: 11/08/2021   PT End of Session - 11/08/21 0917     Visit Number 30    Number of Visits 33    Date for PT Re-Evaluation 11/29/21    Authorization Type Initial Eval 45/80/9983; Recert 38/25/0539-7/67/3419; Recert 3/79/0240-9/73/5329    Progress Note Due on Visit 30    PT Start Time 0800    PT Stop Time 0840    PT Time Calculation (min) 40 min    Equipment Utilized During Treatment Other (comment);Gait belt   SPC/ankle brace   Activity Tolerance Patient tolerated treatment well    Behavior During Therapy [facility] for tasks assessed/performed             Past Medical History:  Diagnosis Date   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 02/2010 Echo: EF 55-60%, no rwma.   Vitamin D deficiency    low    Past Surgical History:  Procedure Laterality Date   ABDOMINAL HYSTERECTOMY  1999   Total lap hyst BSO due to endometriosis   CARDIAC ELECTROPHYSIOLOGY MAPPING AND ABLATION  2011   COLONOSCOPY WITH PROPOFOL N/A 07/10/2015   Procedure: COLONOSCOPY WITH PROPOFOL;  Surgeon: Lollie Sails, MD;   Location[facility] ENDOSCOPY;  Service: Endoscopy;  Laterality: N/A;   GALLBLADDER SURGERY     HERNIA REPAIR  12/2010   LEFT HEART CATH AND CORONARY ANGIOGRAPHY N/A 02/24/2017   Procedure: Left Heart Cath and Coronary Angiography;  Surgeon: Wellington Hampshire, MD;  Location: [place] CV LAB;  Service: Cardiovascular;  Laterality: N/A;   TONSILLECTOMY      There were no vitals filed for this visit.   Subjective Assessment - 11/08/21 0804     Subjective Patient reports doing well and better each week. She continues to report some pain in mid/low back, hips, left knee and ankle as well as some swelling but states everything is doing better and she working on her exercises at home as instructed.    Patient is accompained by: Family member    Pertinent History 61 yo Female s/p fall with subsequent closed non-displaced segmental fracture of left fibula on 05/17/21. Pt reports she sustained her initial injury when she was leaving a resturaunt. She reprots she was stepping onto an outdoor carpeted surface which had a unmarked, large indentation beneath the carpet which could not be noticed. When she stepped into this area, her ankle turned and resulted in the fracture and fall. She was casted for 6 weeks and has now been given a  walking boot which she will wear for 6 weeks. MD order states, May gradually advance weight bearing as tolerated in the boot. Once full weightbearing, may wean out of boot She also suffered an L1 compression fracture (25% loss of height). She reports orthopedic MD has not evaluated her ankle. She has a referral to [facility] to evaluate ankle later today. 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 As far as she is aware she has not had a clot at this point. She also has osteopenia. Pt is unable to don/doff boot independently due to difficulty bending over, husband helps her; Pt reports increased back pain with difficulty getting  in/out of bed and has since been sleeping and living in a lift chair. PMH significant: Clotting disorder, HLD, hypothyroidism, Osteopenia, SVT, vitamin D deficiency    Limitations Sitting;Lifting;Standing;Walking;House hold activities    Diagnostic tests 10/2: MRI of lumbar spine: Evolving subacute compression fracture involving the superior  endplate of L1 with mild 25% height loss and trace 2 mm bony  retropulsion. No associated stenosis.  2. Small left foraminal disc protrusion with annular fissure at  L4-5, closely approximating and potentially irritating the exiting  left L4 nerve root.  3. Mild-to-moderate facet hypertrophy at L3-4 through L5-S1. X-ray of LLE: 1.  Mildly displaced oblique extra-articular fracture of the proximal left fibular diaphysis.   2.  Nondisplaced left lateral malleolar fracture.   3.  There is no significant osteoarthrosis.    Patient Stated Goals Pt wants to be able to walk normally again without pain and with normalized gait pattern and return to previous level of function.    Currently in Pain? Yes    Pain Score --   mid/low back, hips, left knee and ankle   Pain Onset --            INTERVENTIONS:    Reviewed All the following HEP in detail with PT demonstrating the activity or verbally review with patient. She was able to recall all activities very well- and demo correct technique. She did experience some increased soreness with position so instructed to modify the position as able or stop if any significant increase in pain.     Access Code: [email or web address] URL: [email or web address] Date: 11/08/2021 Prepared by: Sande Brothers  Exercises Gastroc Stretch on Wall - 1 x daily - 7 x weekly - 3 sets - 30 sec hold Soleus Stretch on Wall - 1 x daily - 7 x weekly - 3 sets - 30 sec hold Hooklying Single Knee to Chest Stretch - 1 x daily - 7 x weekly - 3 sets - 30 sec hold Supine Lower Trunk Rotation - 1 x daily - 7 x weekly - 3 sets - 30 sec  hold Seated Hamstring Stretch - 1 x daily - 7 x weekly - 3 sets - 30 sec hold Supine Piriformis Stretch with Leg Straight - 1 x daily - 7 x weekly - 3 sets - 30 sec hold Standing Hip Flexor Stretch on Chair - 1 x daily - 7 x weekly - 3 sets - 30 sec hold Child's Pose Stretch - 1 x daily - 7 x weekly - 3 sets - 30 sec hold Seated Lumbar Flexion Stretch - 1 x daily - 7 x weekly - 3 sets - 30 sec hold Tandem Stance - 1 x daily - 3 x weekly - 3 sets - 10-20 hold Single Leg Stance - 1 x daily -  3 x weekly - 3 sets - 5-10 sec hold Standing Hip Flexion March - 1 x daily - 3 x weekly - 3 sets - 10 reps - 2 sec hold Standing Hip Extension with Counter Support - 1 x daily - 3 x weekly - 3 sets - 10 reps - 2 sec hold Standing Hip Abduction with Counter Support - 1 x daily - 3 x weekly - 3 sets - 10 reps - 2 sec hold Standing Knee Flexion with Counter Support - 1 x daily - 3 x weekly - 3 sets - 10 reps - 2 sec hold Mini Squat with Counter Support - 1 x daily - 3 x weekly - 3 sets - 10 reps - 2 sec hold Heel Raises with Counter Support - 1 x daily - 3 x weekly - 3 sets - 10 reps - 2 sec hold    .                      PT Education - 11/09/21 0916     Education Details Review of current home program and issued another handout to consolidate and refresh.    Person(s) Educated Patient    Methods Explanation;Demonstration    Comprehension Verbalized understanding;Returned demonstration              PT Short Term Goals - 09/24/21 0807       PT SHORT TERM GOAL #1   Title Patient will be independent in home exercise program to improve strength/mobility for better functional independence with ADLs.    Baseline 10/26: HEP given, Pt is independent and completes HEP regularly    Time 4    Period Weeks    Status Achieved    Target Date 08/08/21      PT SHORT TERM GOAL #2   Title Patient will report a worse VAS of 4/10 in LLE for improved tolerance of mobility and quality of life.     Baseline 4 of 10 in seated position but patient reports pain is up to 6-7 out of 10 at times. 09/05/2021= Patient reports pain level in ankle= 5/10 today- having more soreness due to increase use of ASO brace.    Time 4    Period Weeks    Status On-going    Target Date 10/03/21               PT Long Term Goals - 11/01/21 2125       PT LONG TERM GOAL #1   Title Patient will increase FOTO score to equal to or greater than 47%    to demonstrate statistically significant improvement in mobility and quality of life.    Baseline 10/26: 5%, 12/1:26.38%; 12/21= 40%; 11/01/2021=65%    Time 8    Period Weeks    Status Achieved    Target Date 10/31/21      PT LONG TERM GOAL #2   Title Patient will increase 10 meter walk test to >1.88m/s as to improve gait speed for better community ambulation and to reduce fall risk.    Baseline 10/26: unable to ambulate at this time 12/1:.101m/s with RW,.55m/s with cane; 09/05/2021= 0.6 m/s using SPC; 11/01/2021= 1.1 m/s without AD    Time 8    Period Weeks    Status Achieved    Target Date 09/27/21      PT LONG TERM GOAL #3   Title Patient will increase BLE gross strength to 4+/5 as to improve functional strength  for independent gait, increased standing tolerance and increased ADL ability.    Baseline 10/26: see note, 12/1: Improved knee and hip strength to 4+/5 in seated position, ankle DF, EV and Inv still require strengthenigng, see flowsheets for more info; 12/22= Patient continues to present with 4+/5 Bilateral Hip flex/abd/knee ext/flex strength and improving ankle strength= 3+/5 ankle DF/PF/EV/IV.  11/01/2021= 4+/5 left LE - hip flex/abd/knee ext/flex; left ankle DF, PF, IV    Time 8    Period Weeks    Status Achieved    Target Date 10/31/21      PT LONG TERM GOAL #4   Title Patient will tolerate weightbearing for >30 minutes for increased mobility and ambulation to increase indepenence with ADLs and iADLs    Baseline 10/26; able to tolerate  approximately 15 minutes: 09/06/2021= Not directly tested in clinic today however patient and husband report that is she able to stand for 30 min at home.    Time 8    Period Weeks    Status Achieved      PT LONG TERM GOAL #5   Title Patient will increase ankle ROM to 10 degrees within normal range for improved gait mechanics and reduced pain.    Baseline 10/26: see note, improved will all ROM since initial eval, see note for specific details: 09/06/2021= ROM measured at last visit 12/19 - has improved to within 10 deg of normal.    Time 8    Period Weeks    Status Achieved      Additional Long Term Goals   Additional Long Term Goals Yes      PT LONG TERM GOAL #6   Title Pt will decrease worst low back pain as reported on NPRS by at least 2 points in order to demonstrate clinically significant reduction in back pain.    Baseline 08/08/2021= LBP at worst = 7/10; 09/05/2021= 5/10 current low back pain- Will keep goal active to ensure patient's pain consistently decreases. 11/01/2021= 4/10    Time 8    Period Weeks    Status Achieved    Target Date 10/31/21      PT LONG TERM GOAL #7   Title Pt will decrease 5TSTS by at least 3 seconds in order to demonstrate clinically significant improvement in LE strength.    Baseline 09/06/2021= 21.45 sec without UE support; 11/01/2021=13.43 sec without UE support    Time 8    Period Weeks    Status Achieved    Target Date 10/31/21      PT LONG TERM GOAL #8   Title Pt will improve BERG by at least 3 points in order to demonstrate clinically significant improvement in balance.    Baseline 09/05/2021= 50/56; 11/01/2021= 56/56    Time 8    Period Weeks    Status Achieved    Target Date 10/31/21      PT LONG TERM GOAL  #9   TITLE Patient will transition from skilled PT services to self care including finalized home program for optimal functional endurance.    Baseline 11/01/2021- Patient actively participating in skilled PT setting and compliant with  current HEP. She will benefit from continued training for updated HEP to set her up for optimal success at home.    Time 4    Period Weeks    Status New    Target Date 11/29/21                   Plan - 11/08/21  0918     Clinical Impression Statement Patient presents with excellent motivation for HEP review today. She asked appropriate questions about frequency, reps, duration and technique with stretching/strengthening exercises. She was able to verbalize all therex well and able to return correct demo of desired exercises. She was instructed to modify as needed. She was presented another updated handout and instructed to practice for a week then return for probable discharge visit to transition from skilled care to self care. Patient in agreement with plan and highly motivated to continue on her own. Patient's condition has the potential to improve in response to therapy. Maximum improvement is yet to be obtained. The anticipated improvement is attainable and reasonable in a generally predictable time    Personal Factors and Comorbidities Age;Comorbidity 3+;Past/Current Experience;Sex;Time since onset of injury/illness/exacerbation;Transportation    Comorbidities HLD, hypothyroidism, ostepenia, osteoporis, SVT, clotting disorder    Examination-Activity Limitations Bathing;Bed Mobility;Bend;Caring for [employer];Locomotion Level;Lift;Hygiene/Grooming;Squat;Stairs;Stand;Toileting;Transfers    Examination-Participation Restrictions Church;Cleaning;Community Activity;Driving;Interpersonal Relationship;Personal Finances;Meal Prep;Laundry;Shop;Volunteer;Yard Work;Occupation    Stability/Clinical Decision Making Evolving/Moderate complexity    Rehab Potential Good    PT Frequency 1x / week    PT Duration 4 weeks    PT Treatment/Interventions ADLs/Self Care Home Management;Aquatic Therapy;Biofeedback;Cryotherapy;Electrical Stimulation;Iontophoresis 4mg /ml  Dexamethasone;Moist Heat;Ultrasound;DME Instruction;Gait training;Stair training;Functional mobility training;Neuromuscular re-education;Cognitive remediation;Balance training;Therapeutic exercise;Therapeutic activities;Patient/family education;Orthotic Fit/Training;Manual techniques;Compression bandaging;Manual lymph drainage;Scar mobilization;Passive range of motion;Energy conservation;Splinting;Taping;Vasopneumatic Device;Visual/perceptual remediation/compensation    PT Next Visit Plan Plan for patient to practice her HEP for 1 more week and then return for 1 more session to review to ensure she has a smooth transition from skilled PT care to self care/HEP.    PT Home Exercise Plan Access Code: [email or web address]  URL: [email or web address]    Consulted and Agree with Plan of Care Patient             Patient will benefit from skilled therapeutic intervention in order to improve the following deficits and impairments:  Abnormal gait, Cardiopulmonary status limiting activity, Decreased activity tolerance, Decreased balance, Decreased knowledge of precautions, Decreased endurance, Decreased knowledge of use of DME, Decreased range of motion, Decreased mobility, Difficulty walking, Decreased strength, Hypomobility, Increased edema, Impaired flexibility, Impaired perceived functional ability, Increased muscle spasms, Pain  Visit Diagnosis: Abnormality of gait and mobility  Difficulty in walking, not elsewhere classified  Muscle weakness (generalized)  Chronic bilateral low back pain without sciatica     Problem List Patient Active Problem List   Diagnosis Date Noted   Tonsillitis 10/26/2021   Sore throat 10/26/2021   NSTEMI (non-ST elevated myocardial infarction) ([place]) 07/14/2021   Pulmonary nodule less than 6 mm determined by computed tomography of lung 07/14/2021   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 07/14/2021   Closed compression fracture of body of  L1 vertebra ([place]) 06/11/2021   Cervical radiculopathy 06/11/2021   Fibula fracture 06/11/2021   Acute cough 09/14/2020   Hepatic hemangioma 06/02/2020   Constipation 06/02/2020   Left ankle sprain 03/25/2018   Foot injury, left, initial encounter 03/25/2018   Osteoporosis of lumbar spine 02/19/2018   Subcutaneous nodule 09/16/2017   Pain and swelling of lower leg 09/04/2017   Nevus 09/04/2017   Pain of both hip joints 06/03/2017   Need for hepatitis C screening test 05/22/2017   SVT s/p ablation 01/09/2010    Lewis Moccasin, PT 11/09/2021, 12:13 PM  [place] [address] [place], [place], [address] Phone: [phone]   Fax:  [phone]  Name:  Michelle Armstrong MRN: [id number] Date of Birth: July 28, 1961

## 2021-11-09 ENCOUNTER — Other Ambulatory Visit: Payer: Managed Care, Other (non HMO)

## 2021-11-09 DIAGNOSIS — D649 Anemia, unspecified: Secondary | ICD-10-CM

## 2021-11-09 DIAGNOSIS — Z131 Encounter for screening for diabetes mellitus: Secondary | ICD-10-CM

## 2021-11-09 DIAGNOSIS — Z1321 Encounter for screening for nutritional disorder: Secondary | ICD-10-CM

## 2021-11-09 DIAGNOSIS — Z1329 Encounter for screening for other suspected endocrine disorder: Secondary | ICD-10-CM

## 2021-11-09 DIAGNOSIS — Z13220 Encounter for screening for lipoid disorders: Secondary | ICD-10-CM

## 2021-11-09 DIAGNOSIS — Z0000 Encounter for general adult medical examination without abnormal findings: Secondary | ICD-10-CM

## 2021-11-10 LAB — HEMOGLOBIN A1C
Est. average glucose Bld gHb Est-mCnc: 111 mg/dL
Hgb A1c MFr Bld: 5.5 % (ref 4.8–5.6)

## 2021-11-10 LAB — CBC WITH DIFFERENTIAL/PLATELET
Basophils Absolute: 0.1 10*3/uL (ref 0.0–0.2)
Basos: 1 %
EOS (ABSOLUTE): 0.1 10*3/uL (ref 0.0–0.4)
Eos: 2 %
Hematocrit: 43.6 % (ref 34.0–46.6)
Hemoglobin: 14.6 g/dL (ref 11.1–15.9)
Immature Grans (Abs): 0 10*3/uL (ref 0.0–0.1)
Immature Granulocytes: 0 %
Lymphocytes Absolute: 1.8 10*3/uL (ref 0.7–3.1)
Lymphs: 37 %
MCH: 29.1 pg (ref 26.6–33.0)
MCHC: 33.5 g/dL (ref 31.5–35.7)
MCV: 87 fL (ref 79–97)
Monocytes Absolute: 0.3 10*3/uL (ref 0.1–0.9)
Monocytes: 6 %
Neutrophils Absolute: 2.7 10*3/uL (ref 1.4–7.0)
Neutrophils: 54 %
Platelets: 300 10*3/uL (ref 150–450)
RBC: 5.02 x10E6/uL (ref 3.77–5.28)
RDW: 12.9 % (ref 11.7–15.4)
WBC: 5 10*3/uL (ref 3.4–10.8)

## 2021-11-10 LAB — IRON,TIBC AND FERRITIN PANEL
Ferritin: 37 ng/mL (ref 15–150)
Iron Saturation: 29 % (ref 15–55)
Iron: 105 ug/dL (ref 27–159)
Total Iron Binding Capacity: 358 ug/dL (ref 250–450)
UIBC: 253 ug/dL (ref 131–425)

## 2021-11-10 LAB — COMPREHENSIVE METABOLIC PANEL
ALT: 31 IU/L (ref 0–32)
AST: 36 IU/L (ref 0–40)
Albumin/Globulin Ratio: 1.8 (ref 1.2–2.2)
Albumin: 4.7 g/dL (ref 3.8–4.9)
Alkaline Phosphatase: 109 IU/L (ref 44–121)
BUN/Creatinine Ratio: 26 — ABNORMAL HIGH (ref 9–23)
BUN: 20 mg/dL (ref 6–24)
Bilirubin Total: 0.9 mg/dL (ref 0.0–1.2)
CO2: 20 mmol/L (ref 20–29)
Calcium: 9.9 mg/dL (ref 8.7–10.2)
Chloride: 101 mmol/L (ref 96–106)
Creatinine, Ser: 0.78 mg/dL (ref 0.57–1.00)
Globulin, Total: 2.6 g/dL (ref 1.5–4.5)
Glucose: 93 mg/dL (ref 70–99)
Potassium: 4.7 mmol/L (ref 3.5–5.2)
Sodium: 139 mmol/L (ref 134–144)
Total Protein: 7.3 g/dL (ref 6.0–8.5)
eGFR: 87 mL/min/{1.73_m2} (ref >59)

## 2021-11-10 LAB — LIPID PANEL
Chol/HDL Ratio: 2.2 ratio (ref 0.0–4.4)
Cholesterol, Total: 247 mg/dL — ABNORMAL HIGH (ref 100–199)
HDL: 110 mg/dL (ref >39)
LDL Chol Calc (NIH): 124 mg/dL — ABNORMAL HIGH (ref 0–99)
Triglycerides: 78 mg/dL (ref 0–149)
VLDL Cholesterol Cal: 13 mg/dL (ref 5–40)

## 2021-11-10 LAB — B12 AND FOLATE PANEL
Folate: >20 ng/mL (ref >3.0)
Vitamin B-12: 817 pg/mL (ref 232–1245)

## 2021-11-10 LAB — THYROID PANEL WITH TSH
Free Thyroxine Index: 1.9 (ref 1.2–4.9)
T3 Uptake Ratio: 26 % (ref 24–39)
T4, Total: 7.2 ug/dL (ref 4.5–12.0)
TSH: 1.76 u[IU]/mL (ref 0.450–4.500)

## 2021-11-10 LAB — AMYLASE: Amylase: 75 U/L (ref 31–110)

## 2021-11-10 LAB — MAGNESIUM: Magnesium: 2.3 mg/dL (ref 1.6–2.3)

## 2021-11-10 LAB — VITAMIN D 25 HYDROXY (VIT D DEFICIENCY, FRACTURES): Vit D, 25-Hydroxy: 56.3 ng/mL (ref 30.0–100.0)

## 2021-11-12 LAB — SARS-COV-2 SPIKE AB DILUTION: SARS-CoV-2 Spike Ab Dilution: 8472 U/mL (ref <0.8)

## 2021-11-12 LAB — SARS-COV-2 SEMI-QUANTITATIVE TOTAL ANTIBODY, SPIKE: SARS-CoV-2 Spike Ab Interp: POSITIVE

## 2021-11-15 ENCOUNTER — Ambulatory Visit: Payer: Managed Care, Other (non HMO) | Attending: Physician Assistant

## 2021-11-15 ENCOUNTER — Other Ambulatory Visit: Payer: Self-pay

## 2021-11-15 DIAGNOSIS — R262 Difficulty in walking, not elsewhere classified: Secondary | ICD-10-CM | POA: Diagnosis present

## 2021-11-15 DIAGNOSIS — R269 Unspecified abnormalities of gait and mobility: Secondary | ICD-10-CM | POA: Diagnosis not present

## 2021-11-15 DIAGNOSIS — M6281 Muscle weakness (generalized): Secondary | ICD-10-CM | POA: Insufficient documentation

## 2021-11-15 DIAGNOSIS — M545 Low back pain, unspecified: Secondary | ICD-10-CM | POA: Insufficient documentation

## 2021-11-15 DIAGNOSIS — G8929 Other chronic pain: Secondary | ICD-10-CM | POA: Diagnosis present

## 2021-11-15 NOTE — Therapy (Signed)
[place] MAIN [facility] SERVICES [address] [place], [place], [address] Phone: [phone]   Fax:  [phone]  Physical Therapy Treatment/Discharge Summary  Patient Details  Name: Michelle Armstrong MRN: [id number] Date of Birth: 10/18/1960 Referring Provider (PT): Hildred Alamin PA   Encounter Date: 11/15/2021   PT End of Session - 11/15/21 2051     Visit Number 31    Number of Visits 33    Date for PT Re-Evaluation 11/29/21    Authorization Type Initial Eval 82/80/0349; Recert 17/91/5056-9/79/4801; Recert 6/55/3748-2/70/7867    Progress Note Due on Visit 30    PT Start Time 0800    PT Stop Time 0830    PT Time Calculation (min) 30 min    Equipment Utilized During Treatment Other (comment);Gait belt   SPC/ankle brace   Activity Tolerance Patient tolerated treatment well    Behavior During Therapy [facility] for tasks assessed/performed             Past Medical History:  Diagnosis Date   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 02/2010 Echo: EF 55-60%, no rwma.   Vitamin D deficiency    low    Past Surgical History:  Procedure Laterality Date   ABDOMINAL HYSTERECTOMY  1999   Total lap hyst BSO due to endometriosis   CARDIAC ELECTROPHYSIOLOGY MAPPING AND ABLATION  2011   COLONOSCOPY WITH PROPOFOL N/A 07/10/2015   Procedure: COLONOSCOPY WITH PROPOFOL;  Surgeon: Lollie Sails, MD;  Location[facility] ENDOSCOPY;  Service: Endoscopy;  Laterality: N/A;   GALLBLADDER  SURGERY     HERNIA REPAIR  12/2010   LEFT HEART CATH AND CORONARY ANGIOGRAPHY N/A 02/24/2017   Procedure: Left Heart Cath and Coronary Angiography;  Surgeon: Wellington Hampshire, MD;  Location: [place] CV LAB;  Service: Cardiovascular;  Laterality: N/A;   TONSILLECTOMY      There were no vitals filed for this visit.   Subjective Assessment - 11/15/21 2047     Subjective Patient reports continuing to do well overall yet still has some ongoing pain- reports compliance with current HEP.    Patient is accompained by: Family member    Pertinent History 61 yo Female s/p fall with subsequent closed non-displaced segmental fracture of left fibula on 05/17/21. Pt reports she sustained her initial injury when she was leaving a resturaunt. She reprots she was stepping onto an outdoor carpeted surface which had a unmarked, large indentation beneath the carpet which could not be noticed. When she stepped into this area, her ankle turned and resulted in the fracture and fall. She was casted for 6 weeks and has now been given a walking boot which she will wear for 6 weeks. MD order states, May gradually advance weight bearing as tolerated in the boot. Once full weightbearing, may wean out of boot She also suffered an L1 compression fracture (25% loss of  height). She reports orthopedic MD has not evaluated her ankle. She has a referral to [facility] to evaluate ankle later today. 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 As far as she is aware she has not had a clot at this point. She also has osteopenia. Pt is unable to don/doff boot independently due to difficulty bending over, husband helps her; Pt reports increased back pain with difficulty getting in/out of bed and has since been sleeping and living in a lift chair. PMH significant: Clotting disorder, HLD, hypothyroidism, Osteopenia, SVT, vitamin D deficiency    Limitations  Sitting;Lifting;Standing;Walking;House hold activities    Diagnostic tests 10/2: MRI of lumbar spine: Evolving subacute compression fracture involving the superior  endplate of L1 with mild 25% height loss and trace 2 mm bony  retropulsion. No associated stenosis.  2. Small left foraminal disc protrusion with annular fissure at  L4-5, closely approximating and potentially irritating the exiting  left L4 nerve root.  3. Mild-to-moderate facet hypertrophy at L3-4 through L5-S1. X-ray of LLE: 1.  Mildly displaced oblique extra-articular fracture of the proximal left fibular diaphysis.   2.  Nondisplaced left lateral malleolar fracture.   3.  There is no significant osteoarthrosis.    Patient Stated Goals Pt wants to be able to walk normally again without pain and with normalized gait pattern and return to previous level of function.    Currently in Pain? Yes    Pain Score --   Patient reports mid/low back pain, left knee and ankle pain           INTERVENTIONS:   Review of current HEP to complete patient's goals today.   Discussed pain relieving strategies including use of rest, position changes, modalities including heat, ice pack or ice cube massage, use of medication as needed. Discussed to let pain be her guide during activities and not to push through the pain to avoid any reinjury.  Patient verbalized good understanding of pain.   Discussed edema/swelling- including importance of elevation, ice, compression, and rest when able to assist with prolonged swelling. Discussed potential wear patterns for compression stocking and detailed how to perform a retro- grade masssage.   Patient was able to verbalize her entire home exercise program and able to demo several of her low back and ankle stretches as well and low back and LE strengthening exercises. She reported no issues with current HEP and met her final goal of independence with home program.                               PT Education - 11/15/21 2050     Education Details Review of HEP, Edema and pain control    Person(s) Educated Patient    Methods Explanation    Comprehension Verbalized understanding              PT Short Term Goals - 09/24/21 0807       PT SHORT TERM GOAL #1   Title Patient will be independent in home exercise program to improve strength/mobility for better functional independence with ADLs.    Baseline 10/26: HEP given, Pt is independent and completes HEP regularly    Time 4    Period Weeks    Status Achieved    Target Date 08/08/21      PT SHORT TERM GOAL #2   Title Patient will report  a worse VAS of 4/10 in LLE for improved tolerance of mobility and quality of life.    Baseline 4 of 10 in seated position but patient reports pain is up to 6-7 out of 10 at times. 09/05/2021= Patient reports pain level in ankle= 5/10 today- having more soreness due to increase use of ASO brace.    Time 4    Period Weeks    Status On-going    Target Date 10/03/21               PT Long Term Goals - 11/15/21 2045       PT LONG TERM GOAL #1   Title Patient will increase FOTO score to equal to or greater than 47%    to demonstrate statistically significant improvement in mobility and quality of life.    Baseline 10/26: 5%, 12/1:26.38%; 12/21= 40%; 11/01/2021=65%    Time 8    Period Weeks    Status Achieved    Target Date 10/31/21      PT LONG TERM GOAL #2   Title Patient will increase 10 meter walk test to >1.75m/s as to improve gait speed for better community ambulation and to reduce fall risk.    Baseline 10/26: unable to ambulate at this time 12/1:.24m/s with RW,.41m/s with cane; 09/05/2021= 0.6 m/s using SPC; 11/01/2021= 1.1 m/s without AD    Time 8    Period Weeks    Status Achieved    Target Date 09/27/21      PT LONG TERM GOAL #3   Title Patient will increase BLE gross strength to 4+/5 as to improve functional strength for  independent gait, increased standing tolerance and increased ADL ability.    Baseline 10/26: see note, 12/1: Improved knee and hip strength to 4+/5 in seated position, ankle DF, EV and Inv still require strengthenigng, see flowsheets for more info; 12/22= Patient continues to present with 4+/5 Bilateral Hip flex/abd/knee ext/flex strength and improving ankle strength= 3+/5 ankle DF/PF/EV/IV.  11/01/2021= 4+/5 left LE - hip flex/abd/knee ext/flex; left ankle DF, PF, IV    Time 8    Period Weeks    Status Achieved    Target Date 10/31/21      PT LONG TERM GOAL #4   Title Patient will tolerate weightbearing for >30 minutes for increased mobility and ambulation to increase indepenence with ADLs and iADLs    Baseline 10/26; able to tolerate approximately 15 minutes: 09/06/2021= Not directly tested in clinic today however patient and husband report that is she able to stand for 30 min at home.    Time 8    Period Weeks    Status Achieved      PT LONG TERM GOAL #5   Title Patient will increase ankle ROM to 10 degrees within normal range for improved gait mechanics and reduced pain.    Baseline 10/26: see note, improved will all ROM since initial eval, see note for specific details: 09/06/2021= ROM measured at last visit 12/19 - has improved to within 10 deg of normal.    Time 8    Period Weeks    Status Achieved      PT LONG TERM GOAL #6   Title Pt will decrease worst low back pain as reported on NPRS by at least 2 points in order to demonstrate clinically significant reduction in back pain.    Baseline 08/08/2021= LBP at worst = 7/10; 09/05/2021= 5/10 current low back pain- Will keep goal active to ensure patient's  pain consistently decreases. 11/01/2021= 4/10    Time 8    Period Weeks    Status Achieved    Target Date 10/31/21      PT LONG TERM GOAL #7   Title Pt will decrease 5TSTS by at least 3 seconds in order to demonstrate clinically significant improvement in LE strength.    Baseline  09/06/2021= 21.45 sec without UE support; 11/01/2021=13.43 sec without UE support    Time 8    Period Weeks    Status Achieved    Target Date 10/31/21      PT LONG TERM GOAL #8   Title Pt will improve BERG by at least 3 points in order to demonstrate clinically significant improvement in balance.    Baseline 09/05/2021= 50/56; 11/01/2021= 56/56    Time 8    Period Weeks    Status Achieved    Target Date 10/31/21      PT LONG TERM GOAL  #9   TITLE Patient will transition from skilled PT services to self care including finalized home program for optimal functional endurance.    Baseline 11/01/2021- Patient actively participating in skilled PT setting and compliant with current HEP. She will benefit from continued training for updated HEP to set her up for optimal success at home. 11/15/2021=Patient was able to verbalize her entire home exercise program and able to demo several of her low back and ankle stretches as well and low back and LE strengthening exercises. She reported no issues with current HEP and met her final goal of independence with home program.    Time 4    Period Weeks    Status Achieved    Target Date 11/29/21                   Plan - 11/15/21 2044     Clinical Impression Statement Patient presents for last visit today and in good spirits. She was able to express her successes including improved overall mobility and strength and describe her ongoing issues with intermittent pain, swelling affecting her daily function. She asked appropriate questions about progression of exercises and expectations for pain/function. She at this time has met all established goals at this time and appropriate for discharge from outpatient PT services with goals met. She will continue with individualized home program on her own and has follow up with her ortho MD tomorrow.    Personal Factors and Comorbidities Age;Comorbidity 3+;Past/Current Experience;Sex;Time since onset of  injury/illness/exacerbation;Transportation    Comorbidities HLD, hypothyroidism, ostepenia, osteoporis, SVT, clotting disorder    Examination-Activity Limitations Bathing;Bed Mobility;Bend;Caring for [employer];Locomotion Level;Lift;Hygiene/Grooming;Squat;Stairs;Stand;Toileting;Transfers    Examination-Participation Restrictions Church;Cleaning;Community Activity;Driving;Interpersonal Relationship;Personal Finances;Meal Prep;Laundry;Shop;Volunteer;Yard Work;Occupation    Stability/Clinical Decision Making Evolving/Moderate complexity    Rehab Potential Good    PT Frequency 1x / week    PT Duration 4 weeks    PT Treatment/Interventions ADLs/Self Care Home Management;Aquatic Therapy;Biofeedback;Cryotherapy;Electrical Stimulation;Iontophoresis 61m/ml Dexamethasone;Moist Heat;Ultrasound;DME Instruction;Gait training;Stair training;Functional mobility training;Neuromuscular re-education;Cognitive remediation;Balance training;Therapeutic exercise;Therapeutic activities;Patient/family education;Orthotic Fit/Training;Manual techniques;Compression bandaging;Manual lymph drainage;Scar mobilization;Passive range of motion;Energy conservation;Splinting;Taping;Vasopneumatic Device;Visual/perceptual remediation/compensation    PT Next Visit Plan Plan to discharge patient today with goals met    PT Home Exercise Plan Access Code: [email or web address] URL: [email or web address]    Consulted and Agree with Plan of Care Patient             Patient will benefit from skilled therapeutic intervention in order to improve the following deficits and impairments:  Abnormal gait, Cardiopulmonary status limiting activity, Decreased activity  tolerance, Decreased balance, Decreased knowledge of precautions, Decreased endurance, Decreased knowledge of use of DME, Decreased range of motion, Decreased mobility, Difficulty walking, Decreased strength, Hypomobility, Increased edema, Impaired  flexibility, Impaired perceived functional ability, Increased muscle spasms, Pain  Visit Diagnosis: Abnormality of gait and mobility  Difficulty in walking, not elsewhere classified  Muscle weakness (generalized)  Chronic bilateral low back pain without sciatica     Problem List Patient Active Problem List   Diagnosis Date Noted   Tonsillitis 10/26/2021   Sore throat 10/26/2021   NSTEMI (non-ST elevated myocardial infarction) ([place]) 07/14/2021   Pulmonary nodule less than 6 mm determined by computed tomography of lung 07/14/2021   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 07/14/2021   Closed compression fracture of body of L1 vertebra ([place]) 06/11/2021   Cervical radiculopathy 06/11/2021   Fibula fracture 06/11/2021   Acute cough 09/14/2020   Hepatic hemangioma 06/02/2020   Constipation 06/02/2020   Left ankle sprain 03/25/2018   Foot injury, left, initial encounter 03/25/2018   Osteoporosis of lumbar spine 02/19/2018   Subcutaneous nodule 09/16/2017   Pain and swelling of lower leg 09/04/2017   Nevus 09/04/2017   Pain of both hip joints 06/03/2017   Need for hepatitis C screening test 05/22/2017   SVT s/p ablation 01/09/2010    Lewis Moccasin, PT 11/15/2021, 8:52 PM  [place] MAIN [facility] SERVICES [address] [place], [place], [address] Phone: [phone]   Fax:  [phone]  Name: BRYNA RAZAVI MRN: [id number] Date of Birth: [birth year]

## 2021-11-19 ENCOUNTER — Ambulatory Visit: Payer: Managed Care, Other (non HMO)

## 2021-11-19 NOTE — Telephone Encounter (Signed)
Received call from patient that is very upset that Dr. Saunders Revel will not amend the encounter from 10/31.  Told patient that amendment was addressed by Dr. Saunders Revel and the outcome.  She was not satisfied with that answer and asked that I reach out to HIM.  ?

## 2021-11-20 ENCOUNTER — Ambulatory Visit (INDEPENDENT_AMBULATORY_CARE_PROVIDER_SITE_OTHER): Payer: Managed Care, Other (non HMO) | Admitting: Dermatology

## 2021-11-20 ENCOUNTER — Encounter: Payer: Self-pay | Admitting: Dermatology

## 2021-11-20 ENCOUNTER — Ambulatory Visit
Admission: RE | Admit: 2021-11-20 | Discharge: 2021-11-20 | Disposition: A | Discharge status: Home / Self Care | Payer: Managed Care, Other (non HMO) | Source: Ambulatory Visit | Attending: Obstetrics and Gynecology | Admitting: Obstetrics and Gynecology

## 2021-11-20 ENCOUNTER — Other Ambulatory Visit: Payer: Self-pay

## 2021-11-20 DIAGNOSIS — Z1283 Encounter for screening for malignant neoplasm of skin: Secondary | ICD-10-CM | POA: Diagnosis not present

## 2021-11-20 DIAGNOSIS — Z1231 Encounter for screening mammogram for malignant neoplasm of breast: Secondary | ICD-10-CM | POA: Diagnosis not present

## 2021-11-20 DIAGNOSIS — L308 Other specified dermatitis: Secondary | ICD-10-CM

## 2021-11-20 DIAGNOSIS — D2262 Melanocytic nevi of left upper limb, including shoulder: Secondary | ICD-10-CM | POA: Diagnosis not present

## 2021-11-20 DIAGNOSIS — D1800 Hemangioma unspecified site: Secondary | ICD-10-CM

## 2021-11-20 DIAGNOSIS — L814 Other melanin hyperpigmentation: Secondary | ICD-10-CM

## 2021-11-20 DIAGNOSIS — D2239 Melanocytic nevi of other parts of face: Secondary | ICD-10-CM

## 2021-11-20 DIAGNOSIS — L578 Other skin changes due to chronic exposure to nonionizing radiation: Secondary | ICD-10-CM

## 2021-11-20 DIAGNOSIS — D229 Melanocytic nevi, unspecified: Secondary | ICD-10-CM

## 2021-11-20 DIAGNOSIS — L821 Other seborrheic keratosis: Secondary | ICD-10-CM

## 2021-11-20 DIAGNOSIS — L858 Other specified epidermal thickening: Secondary | ICD-10-CM

## 2021-11-20 DIAGNOSIS — L309 Dermatitis, unspecified: Secondary | ICD-10-CM

## 2021-11-20 DIAGNOSIS — L219 Seborrheic dermatitis, unspecified: Secondary | ICD-10-CM

## 2021-11-20 MED ORDER — EUCRISA 2 % EX OINT: TOPICAL_OINTMENT | CUTANEOUS | 2 refills | Status: DC

## 2021-11-20 NOTE — Progress Notes (Signed)
? ?Follow-Up Visit ?  ?Subjective  ?Michelle Armstrong is a 61 y.o. female who presents for the following: Annual Exam (No H/O skin cancer or DN. C/O itchy bumps on arms, shoulders, face that come and go. Has used Mometasone in the past). ? ?The patient presents for Total-Body Skin Exam (TBSE) for skin cancer screening and mole check.  The patient has spots, moles and lesions to be evaluated, some may be new or changing and the patient has concerns that these could be cancer. ? ? ?The following portions of the chart were reviewed this encounter and updated as appropriate:   ?  ? ?Review of Systems: No other skin or systemic complaints except as noted in HPI or Assessment and Plan. ? ? ?Objective  ?Well appearing patient in no apparent distress; mood and affect are within normal limits. ? ?A full examination was performed including scalp, head, eyes, ears, nose, lips, neck, chest, axillae, abdomen, back, buttocks, bilateral upper extremities, bilateral lower extremities, hands, feet, fingers, toes, fingernails, and toenails. All findings within normal limits unless otherwise noted below. ? ?Left Shoulder - Posterior, Right Mid Cheek ?4 mm flesh papule at right mid cheek ?  ?4 mm medium speckled brown papule at left posterior shoulder ? ?Right Forearm ?69m speckled tan macule ? ?Right Hand - Posterior ?Moderate erythema and xerosis. ? ?Left Upper Eyelid, Right Upper Eyelid ?Mild erythema with mild scale at margin ? ? ?Assessment & Plan  ?Nevus (2) ?Left Shoulder - Posterior; Right Mid Cheek ? ?Benign-appearing.  Observation.  Call clinic for new or changing lesions.  Recommend daily use of broad spectrum spf 30+ sunscreen to sun-exposed areas.   ? ?Lentigo ?Right Forearm ? ?Benign-appearing.  Observation.  Call clinic for new or changing lesions.  Recommend daily use of broad spectrum spf 30+ sunscreen to sun-exposed areas.   ? ?Hand dermatitis ?Right Hand - Posterior ? ?Chronic and persistent condition with duration  or expected duration over one year. Condition is bothersome/symptomatic for patient. Currently flared. ? ? ?Start Eucrisa ointment twice daily to affected areas as needed for dryness or itching.  ? ?If not covered will send Tacrolimus or Pimecrolimus.  ? ?Hand Dermatitis is a chronic type of eczema that can come and go on the hands and fingers.  While there is no cure, the rash and symptoms can be managed with topical prescription medications, and for more severe cases, with systemic medications.  Recommend mild soap and routine use of moisturizing cream after handwashing.  Minimize soap/water exposure when possible.    ? ?Crisaborole (EUCRISA) 2 % OINT - Right Hand - Posterior ?Apply twice daily to hands as needed for dryness ? ?Seborrheic dermatitis ?Left Upper Eyelid; Right Upper Eyelid ? ?Recommend using OCuSOFT Foaming Eyelid Cleanser prn. ? ?Can use Eucrisa on eyelids bid as needed for itching. Avoid getting into eyes.  ? ? ? ?Lentigines ?- Scattered tan macules (chest, back) ?- Due to sun exposure ?- Benign-appearing, observe ?- Recommend daily broad spectrum sunscreen SPF 30+ to sun-exposed areas, reapply every 2 hours as needed. ?- Call for any changes ? ?Seborrheic Keratoses ?- Stuck-on, waxy, tan-brown papules and/or plaques (left post shoulder) ?- Benign-appearing ?- Discussed benign etiology and prognosis. ?- Observe ?- Call for any changes ? ?Melanocytic Nevi ?- Tan-brown and/or pink-flesh-colored symmetric macules and papules (back) ?- Benign appearing on exam today ?- Observation ?- Call clinic for new or changing moles ?- Recommend daily use of broad spectrum spf 30+ sunscreen to sun-exposed areas.  ? ?  Hemangiomas ?- Red papules ?- Discussed benign nature ?- Observe ?- Call for any changes ? ?Actinic Damage ?- Chronic condition, secondary to cumulative UV/sun exposure ?- diffuse scaly erythematous macules with underlying dyspigmentation ?- Recommend daily broad spectrum sunscreen SPF 30+ to  sun-exposed areas, reapply every 2 hours as needed.  ?- Staying in the shade or wearing long sleeves, sun glasses (UVA+UVB protection) and wide brim hats (4-inch brim around the entire circumference of the hat) are also recommended for sun protection.  ?- Call for new or changing lesions. ? ?Keratosis Pilaris ?- Tiny follicular keratotic papules at legs, arms ?- Benign. Genetic in nature. No cure. ?- Observe. ?- If desired, patient can use an emollient (moisturizer) containing ammonium lactate, urea or salicylic acid once a day to smooth the area ? ? ?Skin cancer screening performed today. ? ? ?Return in about 1 year (around 11/21/2022) for TBSE. ? ?I, Emelia Salisbury, CMA, am acting as scribe for Brendolyn Patty, MD. ? ?Documentation: I have reviewed the above documentation for accuracy and completeness, and I agree with the above. ? ?Brendolyn Patty MD  ? ? ?

## 2021-11-20 NOTE — Patient Instructions (Addendum)
Recommend starting moisturizer with exfoliant (Urea, Salicylic acid, or Lactic acid) one to two times daily to help smooth rough and bumpy skin.  OTC options include Cetaphil Rough and Bumpy lotion (Urea), Eucerin Roughness Relief lotion or spot treatment cream (Urea), CeraVe SA lotion/cream for Rough and Bumpy skin (Sal Acid), Gold Bond Rough and Bumpy cream (Sal Acid), and AmLactin 12% lotion/cream (Lactic Acid).  If applying in morning, also apply sunscreen to sun-exposed areas, since these exfoliating moisturizers can increase sensitivity to sun.   Hands:  Start Eucrisa ointment twice daily to affected areas as needed for dryness or itching. Can use on eyelids if itching. Can be used on any area of the body for itching/rash.   Eyelids: OCUSOFT Foaming Eyelid Cleanser  Lips: Vaseline Jelly or Aquaphor  Sunscreen: CeraVe AM Facial moisturizer with sunscreen.     Gentle Skin Care Guide  1. Bathe no more than once a day.  2. Avoid bathing in hot water  3. Use a mild soap like Dove, Vanicream, Cetaphil, CeraVe. Can use Lever 2000 or Cetaphil antibacterial soap  4. Use soap only where you need it. On most days, use it under your arms, between your legs, and on your feet. Let the water rinse other areas unless visibly dirty.  5. When you get out of the bath/shower, use a towel to gently blot your skin dry, don't rub it.  6. While your skin is still a little damp, apply a moisturizing cream such as Vanicream, CeraVe, Cetaphil, Eucerin, Sarna lotion or plain Vaseline Jelly. For hands apply Neutrogena [place] Hand Cream. CeraVe PM facial moisturizer for night time to face.  7. Reapply moisturizer any time you start to itch or feel dry.  8. Sometimes using free and clear laundry detergents can be helpful. Fabric softener sheets should be avoided. Downy Free & Gentle liquid, or any liquid fabric softener that is free of dyes and perfumes, it acceptable to use  9. If your doctor has given  you prescription creams you may apply moisturizers over them       Recommend daily broad spectrum sunscreen SPF 30+ to sun-exposed areas, reapply every 2 hours as needed. Call for new or changing lesions.  Staying in the shade or wearing long sleeves, sun glasses (UVA+UVB protection) and wide brim hats (4-inch brim around the entire circumference of the hat) are also recommended for sun protection.    Melanoma ABCDEs  Melanoma is the most dangerous type of skin cancer, and is the leading cause of death from skin disease.  You are more likely to develop melanoma if you: Have light-colored skin, light-colored eyes, or red or blond hair Spend a lot of time in the sun Tan regularly, either outdoors or in a tanning bed Have had blistering sunburns, especially during childhood Have a close family member who has had a melanoma Have atypical moles or large birthmarks  Early detection of melanoma is key since treatment is typically straightforward and cure rates are extremely high if we catch it early.   The first sign of melanoma is often a change in a mole or a new dark spot.  The ABCDE system is a way of remembering the signs of melanoma.  A for asymmetry:  The two halves do not match. B for border:  The edges of the growth are irregular. C for color:  A mixture of colors are present instead of an even brown color. D for diameter:  Melanomas are usually (but not always) greater than  18m - the size of a pencil eraser. E for evolution:  The spot keeps changing in size, shape, and color.  Please check your skin once per month between visits. You can use a small mirror in front and a large mirror behind you to keep an eye on the back side or your body.   If you see any new or changing lesions before your next follow-up, please call to schedule a visit.  Please continue daily skin protection including broad spectrum sunscreen SPF 30+ to sun-exposed areas, reapplying every 2 hours as needed  when you're outdoors.   Staying in the shade or wearing long sleeves, sun glasses (UVA+UVB protection) and wide brim hats (4-inch brim around the entire circumference of the hat) are also recommended for sun protection.    If You Need Anything After Your Visit  If you have any questions or concerns for your doctor, please call our main line at [phone] press option 4 to reach your doctor's medical assistant. If no one answers, please leave a voicemail as directed and we will return your call as soon as possible. Messages left after 4 pm will be answered the following business day.   You may also send u[place] message via M[place] We typically respond to MyChart messages within 1-2 business days.  For prescription refills, please ask your pharmacy to contact our office. Our fax number is [phone]  If you have an urgent issue when the clinic is closed that cannot wait until the next business day, you can page your doctor at the number below.    Please note that while we do our best to be available for urgent issues outside of office hours, we are not available 24/7.   If you have an urgent issue and are unable to reach u[place] you may choose to seek medical care at your doctor's office, retail clinic, urgent care center, or emergency room.  If you have a medical emergency, please immediately call 911 or go to the emergency department.  Pager Numbers  - Dr. KNehemiah Massed [phone] - Dr. MLaurence Ferrari [phone] - Dr. SNicole Kindred [phone] In the event of inclement weather, please call our main line at [phone] an update on the status of any delays or closures.  Dermatology Medication Tips: Please keep the boxes that topical medications come in in order to help keep track of the instructions about where and how to use these. Pharmacies typically print the medication instructions only on the boxes and not directly on the medication tubes.   If your medication is too expensive,  please contact our office at [phone] 4 or send [place] message through [place]   We are unable to tell what your co-pay for medications will be in advance as this is different depending on your insurance coverage. However, we may be able to find a substitute medication at lower cost or fill out paperwork to get insurance to cover a needed medication.   If a prior authorization is required to get your medication covered by your insurance company, please allow u[place]orea1-2 business days to complete this process.  Drug prices often vary depending on where the prescription is filled and some pharmacies may offer cheaper prices.  The website www.goodrx.com contains coupons for medications through different pharmacies. The prices here do not account for what the cost may be with help from insurance (it may be cheaper with your insurance), but the website can give you the price if you  did not use any insurance.  - You can print the associated coupon and take it with your prescription to the pharmacy.  - You may also stop by our office during regular business hours and pick up a GoodRx coupon card.  - If you need your prescription sent electronically to a different pharmacy, notify our office through [facility] or by phone at [phone] option 4.     Si Usted Necesita Algo Despus de Su Visita  Tambin puede enviarnos un mensaje a travs de Pharmacist, community. Por lo general respondemos a los mensajes de MyChart en el transcurso de 1 a 2 das hbiles.  Para renovar recetas, por favor pida a su farmacia que se ponga en contacto con nuestra oficina. Harland Dingwall de fax es [place] [phone].  Si tiene un asunto urgente cuando la clnica est cerrada y que no puede esperar hasta el siguiente da hbil, puede llamar/localizar a su doctor(a) al nmero que aparece a continuacin.   Por favor, tenga en cuenta que aunque hacemos todo lo posible para estar disponibles para asuntos urgentes fuera del  horario de [place], no estamos disponibles las 24 horas del da, los 7 das de la [place].   Si tiene un problema urgente y no puede comunicarse con nosotros, puede optar por buscar atencin mdica  en el consultorio de su doctor(a), en una clnica privada, en un centro de atencin urgente o en una sala de emergencias.  Si tiene Engineering geologist, por favor llame inmediatamente al 911 o vaya a la sala de emergencias.  Nmeros de bper  - Dr. Nehemiah Massed: [phone]  - Dra. Moye: [phone]  - Dra. Nicole Kindred: [phone]  En caso de inclemencias del [place], por favor llame a Johnsie Kindred principal al [phone] para una actualizacin sobre el [place] de cualquier retraso o cierre.  Consejos para la medicacin en dermatologa: Por favor, guarde las cajas en las que vienen los medicamentos de uso tpico para ayudarle a seguir las instrucciones sobre dnde y cmo usarlos. Las farmacias generalmente imprimen las instrucciones del medicamento slo en las cajas y no directamente en los tubos del [place].   Si su medicamento es muy caro, por favor, pngase en contacto con Zigmund Daniel llamando al [phone] y presione la opcin 4 o envenos un mensaje a travs de Pharmacist, community.   No podemos decirle cul ser su copago por los medicamentos por adelantado ya que esto es diferente dependiendo de la cobertura de su seguro. Sin embargo, es posible que podamos encontrar un medicamento sustituto a Electrical engineer un formulario para que el seguro cubra el medicamento que se considera necesario.   Si se requiere una autorizacin previa para que su compaa de seguros [place] su medicamento, por favor permtanos de 1 a 2 das hbiles para completar este proceso.  Los precios de los medicamentos varan con frecuencia dependiendo del Environmental consultant de dnde se surte la receta y alguna farmacias pueden ofrecer precios ms baratos.  El sitio web www.goodrx.com tiene cupones para medicamentos de Office manager. Los precios aqu no tienen en cuenta lo que podra costar con la ayuda del seguro (puede ser ms barato con su seguro), pero el sitio web puede darle el precio si no utiliz Research scientist (physical sciences).  - Puede imprimir el cupn correspondiente y llevarlo con su receta a la farmacia.  - Tambin puede pasar por nuestra oficina durante el horario de atencin regular y Charity fundraiser una tarjeta de cupones de GoodRx.  - Si necesita que su receta se  enve electrnicamente a una farmacia diferente, informe a nuestra oficina a travs de MyChart de [place] o por telfono llamando al [phone] y presione la opcin 4.

## 2021-11-21 ENCOUNTER — Ambulatory Visit: Payer: Managed Care, Other (non HMO)

## 2021-11-26 ENCOUNTER — Ambulatory Visit: Payer: Managed Care, Other (non HMO)

## 2021-11-28 ENCOUNTER — Ambulatory Visit: Payer: Managed Care, Other (non HMO)

## 2021-11-29 ENCOUNTER — Ambulatory Visit (INDEPENDENT_AMBULATORY_CARE_PROVIDER_SITE_OTHER): Payer: Managed Care, Other (non HMO) | Admitting: Obstetrics and Gynecology

## 2021-11-29 ENCOUNTER — Other Ambulatory Visit: Payer: Self-pay

## 2021-11-29 ENCOUNTER — Encounter: Payer: Self-pay | Admitting: Obstetrics and Gynecology

## 2021-11-29 VITALS — BP 100/70 | Ht 67.5 in | Wt 165.0 lb

## 2021-11-29 DIAGNOSIS — Z01419 Encounter for gynecological examination (general) (routine) without abnormal findings: Secondary | ICD-10-CM | POA: Diagnosis not present

## 2021-11-29 DIAGNOSIS — Z124 Encounter for screening for malignant neoplasm of cervix: Secondary | ICD-10-CM | POA: Diagnosis not present

## 2021-11-29 DIAGNOSIS — N952 Postmenopausal atrophic vaginitis: Secondary | ICD-10-CM | POA: Diagnosis not present

## 2021-11-29 DIAGNOSIS — Z1231 Encounter for screening mammogram for malignant neoplasm of breast: Secondary | ICD-10-CM | POA: Diagnosis not present

## 2021-11-29 NOTE — Patient Instructions (Signed)
I value your feedback and you entrusting us with your care. If you get a [facility] patient survey, I would appreciate you taking the time to let us know about your experience today. Thank you! ? ? ?

## 2021-11-29 NOTE — Progress Notes (Signed)
?  ?   ?Chief Complaint  ?Patient presents with  ? Gynecologic Exam  ? [employer] Employee  ?  ?  ?  ?HPI: ?     Ms. MARCINE GADWAY is a 61 y.o. G2P0011 who LMP was No LMP recorded. Patient has had a hysterectomy., presents today for her annual examination. Her menses are absent due to total lap hyst BSO due to endometriosis 1999. She does not have PMB. No vasomotor sx. ?  ?Sex activity: single partner, contraception - status post hysterectomy. She does have vaginal dryness, slightly improved with ThermaVi procedure and lubricants with some relief. Has vaginal bleeding with sex again. Did oral HRT in past and didn't like it, has declined vag ERT. Has gene for clotting disorder. ?  ?Last Pap: 01/25/21 Results were: no abnormalities. Neg HPV DNA 2021.  Pt likes yearly paps (gets reimbursed from [facility]) ?Hx of STDs: none ?  ?Last mammogram: 11/20/21  Results were: normal--routine follow-up in 12 months ?There is no FH of breast cancer. There is no FH of ovarian cancer. The patient does do self-breast exams. ?  ?Colonoscopy: colonoscopy 10/21 without abnormalities, with [facility]. Repeat due after 5 yrs.  ? 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 Taking ca/Vit D. ?  ?Tobacco use: The patient denies current or previous tobacco use. ?Alcohol use: none ?No drug use ?Exercise: moderately active ?  ?She does get adequate calcium and Vitamin D in her diet. ?Recent labs 2/23 WNL. Total chol and LDL borderline but HDL is 110. Pt taking MVI with iron.  ? ?Had leg fx last yr after bad fall. Doing better. ?  ?  ?Past Medical History:  ?Diagnosis Date  ? 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 Close exposure to COVID-19 virus 09/29/2020  ? Clotting disorder ([place])   ? pt states she has a mutated gene that predisposes her to clotting  ? Cough 09/14/2020  ? Early menopause   ? age [age]  ? HLD (hyperlipidemia)   ? Hypothyroidism   ? possible  h/o  ? Insect bite 06/02/2020  ? Liver hemangioma   ? per patient  ? Osteopenia 2016, 2018  ? spine/hip; DEXA at [facility]; 2018-hip  ? Osteoporosis 2018  ? spine; DEXA at [facility]  ? SVT (supraventricular tachycardia) ([place])   ? 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 02/2010 Echo: EF 55-60%, no rwma.  ? Vitamin D deficiency   ? low  ? ?Past Surgical History:  ?Procedure Laterality Date  ? ABDOMINAL HYSTERECTOMY  1999  ? Total lap hyst BSO due to endometriosis  ? CARDIAC ELECTROPHYSIOLOGY [place] AND ABLATION  2011  ? COLONOSCOPY WITH PROPOFOL N/A 07/10/2015  ? Procedure: COLONOSCOPY WITH PROPOFOL;  Surgeon: Lollie Sails, MD;  Location: [facility] ENDOSCOPY;  Service: Endoscopy;  Laterality: N/A;  ? GALLBLADDER SURGERY    ? HERNIA REPAIR  12/2010  ? LEFT HEART CATH AND CORONARY ANGIOGRAPHY N/A 02/24/2017  ? Procedure: Left Heart Cath and Coronary Angiography;  Surgeon: Wellington Hampshire, MD;  Location: [facility] LAB;  Service: Cardiovascular;  Laterality: N/A;  ? TONSILLECTOMY    ? ?Family History  ?Problem Relation Age of Onset  ? Heart disease Father   ?     s/p CABG  ? Obesity Father   ? Hyperlipidemia Father   ? Bladder Cancer Father   ? Drug abuse Father   ?  Thyroid disease Sister   ? Clotting disorder Sister   ?     gene mutation  ? Breast cancer Neg Hx   ?  ?Social History  ?  ?     ?Socioeconomic History  ? Marital status: Married  ?    Spouse name: Not on file  ? Number of children: 1  ? Years of education: Not on file  ? Highest education level: Not on file  ?Occupational History  ? Occupation: Therapist, art rep  ?    Employer: [employer]  ?Social Needs  ? Financial resource strain: Not on file  ? Food insecurity  ?    Worry: Not on file  ?    Inability: Not on file  ? Transportation needs  ?    Medical: Not on file  ?    Non-medical: Not on file  ?Tobacco Use  ? Smoking status: Never Smoker  ? Smokeless tobacco: Never Used  ? Tobacco comment: tobacco use- no   ?Substance and Sexual Activity  ? Alcohol use: Yes  ?     Alcohol/week: 0.0 standard drinks  ?    Comment: rare glass of wine.  ? Drug use: No  ? Sexual activity: Yes  ?    Birth control/protection: Post-menopausal  ?Lifestyle  ? Physical activity  ?    Days per week: Not on file  ?    Minutes per session: Not on file  ? Stress: Not on file  ?Relationships  ? Social connections  ?    Talks on phone: Not on file  ?    Gets together: Not on file  ?    Attends religious service: Not on file  ?    Active member of club or organization: Not on file  ?    Attends meetings of clubs or organizations: Not on file  ?    Relationship status: Not on file  ? Intimate partner violence  ?    Fear of current or ex partner: Not on file  ?    Emotionally abused: Not on file  ?    Physically abused: Not on file  ?    Forced sexual activity: Not on file  ?Other Topics Concern  ? Not on file  ?Social History Narrative  ?  Lives locally with husband.  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  ?  ?  ?Current Outpatient Medications:  ?  Ascorbic Acid (VITAMIN C) 100 MG tablet, Take 100 mg by mouth daily., Disp: , Rfl:  ?  aspirin EC 81 MG tablet, Take 81 mg by mouth daily., Disp: , Rfl:  ?  B Complex-C-Folic Acid (SUPER B COMPLEX/FA/VIT C) TABS, Take 1 tablet by mouth daily., Disp: , Rfl:  ?  Calcium Carbonate 500 MG CHEW, Chew 1 tablet by mouth daily., Disp: , Rfl:  ?  calcium-vitamin D (OSCAL WITH D) 500-5 MG-MCG tablet, Take 1 tablet by mouth daily with breakfast., Disp: , Rfl:  ?  cholecalciferol (VITAMIN D) 1000 units tablet, Take 1,000 Units by mouth daily., Disp: , Rfl:  ?  Collagen Hydrolysate POWD, 2 Scoops as directed., Disp: , Rfl:  ?  ferrous sulfate 325 (65 FE) MG EC tablet, Take 325 mg by mouth 3 (three) times daily with meals., Disp: , Rfl:  ?  Multiple Vitamins-Minerals (MULTIVITAMIN PO), Take 1 tablet by mouth daily., Disp: , Rfl:  ?  nitroGLYCERIN (NITROSTAT) 0.4 MG SL tablet, Place 1 tablet (0.4 mg total) under  the tongue every 5 (five) minutes as needed for chest pain.,  Disp: 25 tablet, Rfl: 1 ?  Polyethylene Glycol 3350 (MIRALAX PO), Take 1 Scoop by mouth., Disp: , Rfl:  ?  Probiotic Product (PROBIOTIC PO), Take by mouth., Disp: , Rfl:  ?  Thiamine HCl (B-1 PO), Take 12.5 mg by mouth., Disp: , Rfl:  ?  Wheat Dextrin (BENEFIBER PO), Take 2 Scoops by mouth., Disp: , Rfl:  ? ?  ?  ?ROS: ? Review of Systems  ?Constitutional:  Negative for fatigue, fever and unexpected weight change.  ?Respiratory:  Negative for cough, shortness of breath and wheezing.   ?Cardiovascular:  Negative for chest pain, palpitations and leg swelling.  ?Gastrointestinal:  Negative for blood in stool, constipation, diarrhea, nausea and vomiting.  ?Endocrine: Negative for cold intolerance, heat intolerance and polyuria.  ?Genitourinary:  Negative for dyspareunia, dysuria, flank pain, frequency, genital sores, hematuria, menstrual problem, pelvic pain, urgency, vaginal bleeding, vaginal discharge and vaginal pain.  ?Musculoskeletal:  Negative for back pain, joint swelling and myalgias.  ?Skin:  Negative for rash.  ?Neurological:  Negative for dizziness, syncope, light-headedness, numbness and headaches.  ?Hematological:  Negative for adenopathy.  ?Psychiatric/Behavioral:  Negative for agitation, confusion, sleep disturbance and suicidal ideas. The patient is not nervous/anxious.   ? ?  ?  ?Objective: ?BP 100/76   Ht '5\' 7"'$  (1.702 m)   BMI 24.59 kg/m?  ?Physical Exam ?Constitutional:   ?   Appearance: She is well-developed.  ?Genitourinary:  ?   Vulva normal.  ?   Genitourinary Comments: UTERUS/CX SURG REM  ?   Right Labia: No rash, tenderness or lesions. ?   Left Labia: No tenderness, lesions or rash. ?   Vaginal cuff intact. ?   No vaginal discharge, erythema or tenderness.  ?   Moderate vaginal atrophy present. ? ?   Right Adnexa: not tender and no mass present. ?   Left Adnexa: not tender and no mass present. ?   Cervix is absent.  ?   Uterus is absent.  ?Breasts: ?   Right: No mass, nipple discharge, skin  change or tenderness.  ?   Left: No mass, nipple discharge, skin change or tenderness.  ?Neck:  ?   Thyroid: No thyromegaly.  ?Cardiovascular:  ?   Rate and Rhythm: Normal rate and regular rhythm.  ?   Heart so

## 2021-12-03 ENCOUNTER — Ambulatory Visit: Payer: Managed Care, Other (non HMO)

## 2021-12-03 LAB — IGP, APTIMA HPV: HPV Aptima: NEGATIVE

## 2021-12-05 ENCOUNTER — Ambulatory Visit: Payer: Managed Care, Other (non HMO)

## 2021-12-10 ENCOUNTER — Ambulatory Visit: Payer: Managed Care, Other (non HMO)

## 2021-12-12 ENCOUNTER — Ambulatory Visit: Payer: Managed Care, Other (non HMO)

## 2021-12-17 ENCOUNTER — Ambulatory Visit: Payer: Managed Care, Other (non HMO)

## 2021-12-18 ENCOUNTER — Ambulatory Visit: Payer: Managed Care, Other (non HMO)

## 2021-12-19 ENCOUNTER — Ambulatory Visit: Payer: Managed Care, Other (non HMO)

## 2021-12-24 ENCOUNTER — Ambulatory Visit: Payer: Managed Care, Other (non HMO)

## 2021-12-26 ENCOUNTER — Ambulatory Visit: Payer: Managed Care, Other (non HMO)

## 2021-12-31 ENCOUNTER — Ambulatory Visit: Payer: Managed Care, Other (non HMO)

## 2022-01-02 ENCOUNTER — Ambulatory Visit: Payer: Managed Care, Other (non HMO)

## 2022-01-14 DIAGNOSIS — M9901 Segmental and somatic dysfunction of cervical region: Secondary | ICD-10-CM | POA: Diagnosis not present

## 2022-01-14 DIAGNOSIS — M9902 Segmental and somatic dysfunction of thoracic region: Secondary | ICD-10-CM | POA: Diagnosis not present

## 2022-01-14 DIAGNOSIS — R519 Headache, unspecified: Secondary | ICD-10-CM | POA: Diagnosis not present

## 2022-01-14 DIAGNOSIS — M62830 Muscle spasm of back: Secondary | ICD-10-CM | POA: Diagnosis not present

## 2022-01-28 DIAGNOSIS — R519 Headache, unspecified: Secondary | ICD-10-CM | POA: Diagnosis not present

## 2022-01-28 DIAGNOSIS — M9902 Segmental and somatic dysfunction of thoracic region: Secondary | ICD-10-CM | POA: Diagnosis not present

## 2022-01-28 DIAGNOSIS — M9901 Segmental and somatic dysfunction of cervical region: Secondary | ICD-10-CM | POA: Diagnosis not present

## 2022-01-28 DIAGNOSIS — M62830 Muscle spasm of back: Secondary | ICD-10-CM | POA: Diagnosis not present

## 2022-01-29 ENCOUNTER — Telehealth: Payer: Self-pay | Admitting: Internal Medicine

## 2022-01-29 NOTE — Telephone Encounter (Signed)
Made 3 attempts to schedule, removed from recall list. ?

## 2022-02-12 DIAGNOSIS — M9902 Segmental and somatic dysfunction of thoracic region: Secondary | ICD-10-CM | POA: Diagnosis not present

## 2022-02-12 DIAGNOSIS — R519 Headache, unspecified: Secondary | ICD-10-CM | POA: Diagnosis not present

## 2022-02-12 DIAGNOSIS — M62830 Muscle spasm of back: Secondary | ICD-10-CM | POA: Diagnosis not present

## 2022-02-12 DIAGNOSIS — M9901 Segmental and somatic dysfunction of cervical region: Secondary | ICD-10-CM | POA: Diagnosis not present

## 2022-03-04 DIAGNOSIS — M62830 Muscle spasm of back: Secondary | ICD-10-CM | POA: Diagnosis not present

## 2022-03-04 DIAGNOSIS — R519 Headache, unspecified: Secondary | ICD-10-CM | POA: Diagnosis not present

## 2022-03-04 DIAGNOSIS — M9902 Segmental and somatic dysfunction of thoracic region: Secondary | ICD-10-CM | POA: Diagnosis not present

## 2022-03-04 DIAGNOSIS — M9901 Segmental and somatic dysfunction of cervical region: Secondary | ICD-10-CM | POA: Diagnosis not present

## 2022-03-25 DIAGNOSIS — R519 Headache, unspecified: Secondary | ICD-10-CM | POA: Diagnosis not present

## 2022-03-25 DIAGNOSIS — M62830 Muscle spasm of back: Secondary | ICD-10-CM | POA: Diagnosis not present

## 2022-03-25 DIAGNOSIS — M9902 Segmental and somatic dysfunction of thoracic region: Secondary | ICD-10-CM | POA: Diagnosis not present

## 2022-03-25 DIAGNOSIS — M9901 Segmental and somatic dysfunction of cervical region: Secondary | ICD-10-CM | POA: Diagnosis not present

## 2022-04-15 DIAGNOSIS — R519 Headache, unspecified: Secondary | ICD-10-CM | POA: Diagnosis not present

## 2022-04-15 DIAGNOSIS — M9902 Segmental and somatic dysfunction of thoracic region: Secondary | ICD-10-CM | POA: Diagnosis not present

## 2022-04-15 DIAGNOSIS — M62830 Muscle spasm of back: Secondary | ICD-10-CM | POA: Diagnosis not present

## 2022-04-15 DIAGNOSIS — M9901 Segmental and somatic dysfunction of cervical region: Secondary | ICD-10-CM | POA: Diagnosis not present

## 2022-04-22 ENCOUNTER — Telehealth: Payer: Self-pay | Admitting: Obstetrics and Gynecology

## 2022-04-22 NOTE — Telephone Encounter (Signed)
Patient called in and wanted you to call her at [phone]. Patient said she's having really bad cramping and her back is hurting. She's in really bad pain. Patient said she didn't know if you could call her something in.Patient said  she started a new job and don't want to miss time. Tye [place] said can you please call her back.

## 2022-04-22 NOTE — Telephone Encounter (Signed)
Pt needs to f/u with PCP if sx not improved with OTC ibup. Can go to UC after work if needed.

## 2022-04-22 NOTE — Telephone Encounter (Signed)
I called patient and let her know what you advised. Thank you

## 2022-04-24 ENCOUNTER — Ambulatory Visit (INDEPENDENT_AMBULATORY_CARE_PROVIDER_SITE_OTHER): Payer: BC Managed Care – PPO

## 2022-04-24 ENCOUNTER — Telehealth: Payer: Self-pay

## 2022-04-24 VITALS — BP 118/78 | HR 88 | Ht 67.5 in | Wt 165.0 lb

## 2022-04-24 DIAGNOSIS — R399 Unspecified symptoms and signs involving the genitourinary system: Secondary | ICD-10-CM | POA: Diagnosis not present

## 2022-04-24 DIAGNOSIS — R109 Unspecified abdominal pain: Secondary | ICD-10-CM

## 2022-04-24 LAB — POCT URINALYSIS DIPSTICK
Appearance: NORMAL
Bilirubin, UA: NEGATIVE
Blood, UA: NEGATIVE
Glucose, UA: NEGATIVE
Nitrite, UA: NEGATIVE
Odor: NORMAL
Protein, UA: NEGATIVE
Spec Grav, UA: 1.015 (ref 1.010–1.025)
Urobilinogen, UA: 0.2 E.U./dL
pH, UA: 5 (ref 5.0–8.0)

## 2022-04-24 MED ORDER — NITROFURANTOIN MONOHYD MACRO 100 MG PO CAPS: 100.0000 mg | ORAL_CAPSULE | Freq: Two times a day (BID) | ORAL | 0 refills | Status: DC

## 2022-04-24 NOTE — Progress Notes (Addendum)
SUBJECTIVE: Michelle Armstrong is a 61 y.o. female who complains of lower abdominal pain, flank pain, and dysuria x 2 weeks, denies fever, chills, or abnormal vaginal discharge or bleeding.   OBJECTIVE: Appears well, in no apparent distress.  Vital signs are normal. Urine dipstick shows positive for leukocytes.    ASSESSMENT: UTI uncomplicated without evidence of pyelonephritis  PLAN: Treatment per orders -Macrobid '100mg'$  BID x 7 days, also push fluids, may use AZO OTC prn. Urine Culture sent today. Call or return to clinic prn if these symptoms worsen or fail to improve as anticipated.

## 2022-04-24 NOTE — Telephone Encounter (Signed)
Returned patient call. She is inquiring if the urinalysis performed in office today shows if it is kidney or bladder/UTI? I advised that the urinalysis only defines if she is positive or negative or general UTI components (Leukocytes, Nitrates, Blood etc.) The urinalysis showed small leukocytes which could be indicative of a UTI. Patient reports she was rolling around on her bed in excruciating left flank pain on Sunday. She is concerned she may have a kidney stone. It still hurts pretty bad. Advised I will send message to [facility] for review.

## 2022-04-24 NOTE — Addendum Note (Signed)
Addended by: Meryl Dare on: 04/24/2022 09:03 AM   Modules accepted: Orders

## 2022-04-25 NOTE — Telephone Encounter (Signed)
Spoke with pt this AM. Her flank pain has improved. Was really bad over the weekend. Has occas dysuria and frequency but doesn't feel like UTI. No hx of kidney stones. Has also been having stomach twinges/squeezing pains. Hx of bariatric surg in past. Has issues with constipation, will increase miralax use to see if helps GI sx.  Awaiting C&S. If neg, can do kidney stone eval. F/u sooner if sx recur. Pt to f/u with PCP re: stomach sx if persist.

## 2022-04-26 ENCOUNTER — Ambulatory Visit: Payer: BC Managed Care – PPO | Admitting: Family Medicine

## 2022-04-26 LAB — URINE CULTURE: Organism ID, Bacteria: NO GROWTH

## 2022-04-26 NOTE — Telephone Encounter (Signed)
Left voicemail to return call regarding results.

## 2022-04-26 NOTE — Telephone Encounter (Signed)
Patient aware of negative urine culture results (NO GROWTH). May discontinue antibiotics. Advised to wait to hear from [place] regarding additional testing for kidney stones.

## 2022-04-29 NOTE — Progress Notes (Signed)
Pls call pt to see if she wants me to schedule CT for kidney stones? Michelle Armstrong

## 2022-04-30 NOTE — Telephone Encounter (Signed)
-----   Message from [place], [place] sent at 04/30/2022  8:40 AM EDT ----- Yes, please schedule. ----- Message ----- From: Amalia Greenhouse Sent: [id number]/94/[id number]   7:27 PM EDT To: Drenda Freeze, CMA  Pls call pt to see if she wants me to schedule CT for kidney stones? Elmo Putt

## 2022-04-30 NOTE — Progress Notes (Signed)
Order placed

## 2022-05-06 DIAGNOSIS — M9902 Segmental and somatic dysfunction of thoracic region: Secondary | ICD-10-CM | POA: Diagnosis not present

## 2022-05-06 DIAGNOSIS — R519 Headache, unspecified: Secondary | ICD-10-CM | POA: Diagnosis not present

## 2022-05-06 DIAGNOSIS — M9901 Segmental and somatic dysfunction of cervical region: Secondary | ICD-10-CM | POA: Diagnosis not present

## 2022-05-06 DIAGNOSIS — M62830 Muscle spasm of back: Secondary | ICD-10-CM | POA: Diagnosis not present

## 2022-05-14 DIAGNOSIS — M25572 Pain in left ankle and joints of left foot: Secondary | ICD-10-CM | POA: Diagnosis not present

## 2022-05-21 DIAGNOSIS — S93492S Sprain of other ligament of left ankle, sequela: Secondary | ICD-10-CM | POA: Diagnosis not present

## 2022-05-21 DIAGNOSIS — M792 Neuralgia and neuritis, unspecified: Secondary | ICD-10-CM | POA: Diagnosis not present

## 2022-05-21 DIAGNOSIS — R600 Localized edema: Secondary | ICD-10-CM | POA: Diagnosis not present

## 2022-05-21 DIAGNOSIS — M65872 Other synovitis and tenosynovitis, left ankle and foot: Secondary | ICD-10-CM | POA: Diagnosis not present

## 2022-06-17 DIAGNOSIS — M9902 Segmental and somatic dysfunction of thoracic region: Secondary | ICD-10-CM | POA: Diagnosis not present

## 2022-06-17 DIAGNOSIS — R519 Headache, unspecified: Secondary | ICD-10-CM | POA: Diagnosis not present

## 2022-06-17 DIAGNOSIS — M9901 Segmental and somatic dysfunction of cervical region: Secondary | ICD-10-CM | POA: Diagnosis not present

## 2022-06-17 DIAGNOSIS — M62830 Muscle spasm of back: Secondary | ICD-10-CM | POA: Diagnosis not present

## 2022-07-08 DIAGNOSIS — M9902 Segmental and somatic dysfunction of thoracic region: Secondary | ICD-10-CM | POA: Diagnosis not present

## 2022-07-08 DIAGNOSIS — M62830 Muscle spasm of back: Secondary | ICD-10-CM | POA: Diagnosis not present

## 2022-07-08 DIAGNOSIS — M9901 Segmental and somatic dysfunction of cervical region: Secondary | ICD-10-CM | POA: Diagnosis not present

## 2022-07-08 DIAGNOSIS — R519 Headache, unspecified: Secondary | ICD-10-CM | POA: Diagnosis not present

## 2022-07-25 DIAGNOSIS — L570 Actinic keratosis: Secondary | ICD-10-CM | POA: Diagnosis not present

## 2022-07-25 DIAGNOSIS — L98499 Non-pressure chronic ulcer of skin of other sites with unspecified severity: Secondary | ICD-10-CM | POA: Diagnosis not present

## 2022-07-25 DIAGNOSIS — D485 Neoplasm of uncertain behavior of skin: Secondary | ICD-10-CM | POA: Diagnosis not present

## 2022-07-25 DIAGNOSIS — L578 Other skin changes due to chronic exposure to nonionizing radiation: Secondary | ICD-10-CM | POA: Diagnosis not present

## 2022-07-25 DIAGNOSIS — D225 Melanocytic nevi of trunk: Secondary | ICD-10-CM | POA: Diagnosis not present

## 2022-07-25 HISTORY — DX: Actinic keratosis: L57.0

## 2022-07-29 DIAGNOSIS — R519 Headache, unspecified: Secondary | ICD-10-CM | POA: Diagnosis not present

## 2022-07-29 DIAGNOSIS — M62830 Muscle spasm of back: Secondary | ICD-10-CM | POA: Diagnosis not present

## 2022-07-29 DIAGNOSIS — M9901 Segmental and somatic dysfunction of cervical region: Secondary | ICD-10-CM | POA: Diagnosis not present

## 2022-07-29 DIAGNOSIS — M9902 Segmental and somatic dysfunction of thoracic region: Secondary | ICD-10-CM | POA: Diagnosis not present

## 2022-08-20 DIAGNOSIS — M62830 Muscle spasm of back: Secondary | ICD-10-CM | POA: Diagnosis not present

## 2022-08-20 DIAGNOSIS — M9901 Segmental and somatic dysfunction of cervical region: Secondary | ICD-10-CM | POA: Diagnosis not present

## 2022-08-20 DIAGNOSIS — R519 Headache, unspecified: Secondary | ICD-10-CM | POA: Diagnosis not present

## 2022-08-20 DIAGNOSIS — M9902 Segmental and somatic dysfunction of thoracic region: Secondary | ICD-10-CM | POA: Diagnosis not present

## 2022-09-17 DIAGNOSIS — R519 Headache, unspecified: Secondary | ICD-10-CM | POA: Diagnosis not present

## 2022-09-17 DIAGNOSIS — M9902 Segmental and somatic dysfunction of thoracic region: Secondary | ICD-10-CM | POA: Diagnosis not present

## 2022-09-17 DIAGNOSIS — M62830 Muscle spasm of back: Secondary | ICD-10-CM | POA: Diagnosis not present

## 2022-09-17 DIAGNOSIS — M9901 Segmental and somatic dysfunction of cervical region: Secondary | ICD-10-CM | POA: Diagnosis not present

## 2022-09-20 DIAGNOSIS — L570 Actinic keratosis: Secondary | ICD-10-CM | POA: Diagnosis not present

## 2022-09-20 DIAGNOSIS — L298 Other pruritus: Secondary | ICD-10-CM | POA: Diagnosis not present

## 2022-09-20 DIAGNOSIS — L821 Other seborrheic keratosis: Secondary | ICD-10-CM | POA: Diagnosis not present

## 2022-09-26 DIAGNOSIS — R202 Paresthesia of skin: Secondary | ICD-10-CM | POA: Diagnosis not present

## 2022-10-14 ENCOUNTER — Telehealth: Payer: Self-pay

## 2022-10-14 ENCOUNTER — Other Ambulatory Visit: Payer: Self-pay | Admitting: Obstetrics and Gynecology

## 2022-10-14 DIAGNOSIS — Z1321 Encounter for screening for nutritional disorder: Secondary | ICD-10-CM

## 2022-10-14 DIAGNOSIS — R519 Headache, unspecified: Secondary | ICD-10-CM | POA: Diagnosis not present

## 2022-10-14 DIAGNOSIS — M9901 Segmental and somatic dysfunction of cervical region: Secondary | ICD-10-CM | POA: Diagnosis not present

## 2022-10-14 DIAGNOSIS — Z131 Encounter for screening for diabetes mellitus: Secondary | ICD-10-CM

## 2022-10-14 DIAGNOSIS — Z1231 Encounter for screening mammogram for malignant neoplasm of breast: Secondary | ICD-10-CM

## 2022-10-14 DIAGNOSIS — M62830 Muscle spasm of back: Secondary | ICD-10-CM | POA: Diagnosis not present

## 2022-10-14 DIAGNOSIS — Z13220 Encounter for screening for lipoid disorders: Secondary | ICD-10-CM

## 2022-10-14 DIAGNOSIS — M9902 Segmental and somatic dysfunction of thoracic region: Secondary | ICD-10-CM | POA: Diagnosis not present

## 2022-10-14 DIAGNOSIS — Z0000 Encounter for general adult medical examination without abnormal findings: Secondary | ICD-10-CM

## 2022-10-14 DIAGNOSIS — Z1329 Encounter for screening for other suspected endocrine disorder: Secondary | ICD-10-CM

## 2022-10-14 NOTE — Telephone Encounter (Signed)
Lab orders placed, pt can schedule fasting appt

## 2022-10-14 NOTE — Telephone Encounter (Signed)
Pt calling for blood work orders to be put in; would like to get them done before appt c [facility] on 3/17th so they can go over the results.  Would like an early morning lab appointment.  [phone]

## 2022-10-15 NOTE — Telephone Encounter (Signed)
Contacted patient via phone, patient is scheduled for 3/8 for fasting labs

## 2022-11-19 DIAGNOSIS — M62830 Muscle spasm of back: Secondary | ICD-10-CM | POA: Diagnosis not present

## 2022-11-19 DIAGNOSIS — M9902 Segmental and somatic dysfunction of thoracic region: Secondary | ICD-10-CM | POA: Diagnosis not present

## 2022-11-19 DIAGNOSIS — R519 Headache, unspecified: Secondary | ICD-10-CM | POA: Diagnosis not present

## 2022-11-19 DIAGNOSIS — M9901 Segmental and somatic dysfunction of cervical region: Secondary | ICD-10-CM | POA: Diagnosis not present

## 2022-11-22 ENCOUNTER — Other Ambulatory Visit: Payer: BC Managed Care – PPO

## 2022-11-22 ENCOUNTER — Ambulatory Visit
Admission: RE | Admit: 2022-11-22 | Discharge: 2022-11-22 | Disposition: A | Discharge status: Home / Self Care | Payer: BC Managed Care – PPO | Source: Ambulatory Visit | Attending: Obstetrics and Gynecology | Admitting: Obstetrics and Gynecology

## 2022-11-22 DIAGNOSIS — Z1231 Encounter for screening mammogram for malignant neoplasm of breast: Secondary | ICD-10-CM | POA: Insufficient documentation

## 2022-11-22 DIAGNOSIS — Z13220 Encounter for screening for lipoid disorders: Secondary | ICD-10-CM | POA: Diagnosis not present

## 2022-11-22 DIAGNOSIS — Z1329 Encounter for screening for other suspected endocrine disorder: Secondary | ICD-10-CM | POA: Diagnosis not present

## 2022-11-22 DIAGNOSIS — Z0000 Encounter for general adult medical examination without abnormal findings: Secondary | ICD-10-CM | POA: Diagnosis not present

## 2022-11-22 DIAGNOSIS — Z1321 Encounter for screening for nutritional disorder: Secondary | ICD-10-CM

## 2022-11-22 DIAGNOSIS — Z131 Encounter for screening for diabetes mellitus: Secondary | ICD-10-CM | POA: Diagnosis not present

## 2022-11-23 LAB — COMPREHENSIVE METABOLIC PANEL
ALT: 32 IU/L (ref 0–32)
AST: 29 IU/L (ref 0–40)
Albumin/Globulin Ratio: 1.9 (ref 1.2–2.2)
Albumin: 4.6 g/dL (ref 3.8–4.9)
Alkaline Phosphatase: 104 IU/L (ref 44–121)
BUN/Creatinine Ratio: 25 (ref 12–28)
BUN: 19 mg/dL (ref 8–27)
Bilirubin Total: 0.9 mg/dL (ref 0.0–1.2)
CO2: 23 mmol/L (ref 20–29)
Calcium: 9.5 mg/dL (ref 8.7–10.3)
Chloride: 104 mmol/L (ref 96–106)
Creatinine, Ser: 0.77 mg/dL (ref 0.57–1.00)
Globulin, Total: 2.4 g/dL (ref 1.5–4.5)
Glucose: 86 mg/dL (ref 70–99)
Potassium: 4.5 mmol/L (ref 3.5–5.2)
Sodium: 143 mmol/L (ref 134–144)
Total Protein: 7 g/dL (ref 6.0–8.5)
eGFR: 88 mL/min/{1.73_m2} (ref >59)

## 2022-11-23 LAB — CBC WITH DIFFERENTIAL/PLATELET
Basophils Absolute: 0.1 10*3/uL (ref 0.0–0.2)
Basos: 1 %
EOS (ABSOLUTE): 0.1 10*3/uL (ref 0.0–0.4)
Eos: 2 %
Hematocrit: 42 % (ref 34.0–46.6)
Hemoglobin: 13.8 g/dL (ref 11.1–15.9)
Immature Grans (Abs): 0 10*3/uL (ref 0.0–0.1)
Immature Granulocytes: 0 %
Lymphocytes Absolute: 1.7 10*3/uL (ref 0.7–3.1)
Lymphs: 36 %
MCH: 29.5 pg (ref 26.6–33.0)
MCHC: 32.9 g/dL (ref 31.5–35.7)
MCV: 90 fL (ref 79–97)
Monocytes Absolute: 0.3 10*3/uL (ref 0.1–0.9)
Monocytes: 7 %
Neutrophils Absolute: 2.5 10*3/uL (ref 1.4–7.0)
Neutrophils: 54 %
Platelets: 293 10*3/uL (ref 150–450)
RBC: 4.68 x10E6/uL (ref 3.77–5.28)
RDW: 13.5 % (ref 11.7–15.4)
WBC: 4.7 10*3/uL (ref 3.4–10.8)

## 2022-11-23 LAB — THYROID PANEL WITH TSH
Free Thyroxine Index: 1.8 (ref 1.2–4.9)
T3 Uptake Ratio: 25 % (ref 24–39)
T4, Total: 7.1 ug/dL (ref 4.5–12.0)
TSH: 2.15 u[IU]/mL (ref 0.450–4.500)

## 2022-11-23 LAB — LIPID PANEL
Chol/HDL Ratio: 2.2 ratio (ref 0.0–4.4)
Cholesterol, Total: 247 mg/dL — ABNORMAL HIGH (ref 100–199)
HDL: 110 mg/dL (ref >39)
LDL Chol Calc (NIH): 123 mg/dL — ABNORMAL HIGH (ref 0–99)
Triglycerides: 82 mg/dL (ref 0–149)
VLDL Cholesterol Cal: 14 mg/dL (ref 5–40)

## 2022-11-23 LAB — VITAMIN D 25 HYDROXY (VIT D DEFICIENCY, FRACTURES): Vit D, 25-Hydroxy: 33.7 ng/mL (ref 30.0–100.0)

## 2022-11-23 LAB — IRON,TIBC AND FERRITIN PANEL
Ferritin: 50 ng/mL (ref 15–150)
Iron Saturation: 26 % (ref 15–55)
Iron: 94 ug/dL (ref 27–159)
Total Iron Binding Capacity: 365 ug/dL (ref 250–450)
UIBC: 271 ug/dL (ref 131–425)

## 2022-11-23 LAB — HEMOGLOBIN A1C
Est. average glucose Bld gHb Est-mCnc: 120 mg/dL
Hgb A1c MFr Bld: 5.8 % — ABNORMAL HIGH (ref 4.8–5.6)

## 2022-11-23 LAB — B12 AND FOLATE PANEL
Folate: >20 ng/mL (ref >3.0)
Vitamin B-12: 622 pg/mL (ref 232–1245)

## 2022-11-23 LAB — AMYLASE: Amylase: 71 U/L (ref 31–110)

## 2022-11-23 LAB — MAGNESIUM: Magnesium: 2.1 mg/dL (ref 1.6–2.3)

## 2022-11-25 ENCOUNTER — Ambulatory Visit: Payer: Managed Care, Other (non HMO) | Admitting: Dermatology

## 2022-12-02 NOTE — Progress Notes (Addendum)
Chief Complaint  Patient presents with   Gynecologic Exam   [employer] Employee--won't be next year since left [place]        HPI:      [name] is a 62 y.o. G2P0011 who LMP was No LMP recorded. Patient has had a hysterectomy., presents today for her annual examination. Her menses are absent due to total lap hyst BSO due to endometriosis 1999. She does not have PMB. No vasomotor sx.   Sex activity: single partner, contraception - status post hysterectomy. She does have vaginal dryness, slightly improved with ThermaVi procedure and lubricants with some relief. Has vaginal bleeding with sex again. Did oral HRT in past and didn't like it, has declined vag ERT. Has gene for clotting disorder.   Last Pap: 11/29/21 Results were: no abnormalities/Neg HPV DNA.  Pt likes yearly paps (gets reimbursed from [facility]) Hx of STDs: none   Last mammogram: 11/22/22  Results were: normal--routine follow-up in 12 months There is no FH of breast cancer. There is no FH of ovarian cancer. The patient does do self-breast exams.   Colonoscopy: colonoscopy 10/21 without abnormalities, with [facility]. Repeat due after 5 yrs.   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 Taking ca/Vit D.   Tobacco use: The patient denies current or previous tobacco use. Alcohol use: none No drug use Exercise: moderately active   She does get adequate calcium and Vitamin D in her diet. Recent labs 3/24. Total chol and LDL borderline but HDL is 161. Pt taking MVI with iron. Now with pre-DM.    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 02/2010 Echo: EF 55-60%, no rwma.   Vitamin D deficiency    low   Past Surgical History:  Procedure Laterality Date   ABDOMINAL HYSTERECTOMY  1999   Total lap hyst BSO due to endometriosis   CARDIAC ELECTROPHYSIOLOGY MAPPING AND ABLATION  2011   COLONOSCOPY WITH PROPOFOL N/A 07/10/2015   Procedure: COLONOSCOPY WITH PROPOFOL;  Surgeon: Christena Deem, MD;  Location: [facility] ENDOSCOPY;  Service: Endoscopy;  Laterality: N/A;   GALLBLADDER SURGERY     HERNIA REPAIR  12/2010   LEFT HEART CATH AND CORONARY ANGIOGRAPHY N/A 02/24/2017   Procedure: Left Heart Cath and Coronary Angiography;  Surgeon: Iran Ouch, MD;  Location: [facility] INVASIVE CV LAB;  Service: Cardiovascular;  Laterality: N/A;   OTHER SURGICAL HISTORY  2023   Shoulder Biopsy   TONSILLECTOMY     Family History  Problem Relation Age of Onset   Heart disease Father        s/p CABG   Obesity Father  Hyperlipidemia Father    Bladder Cancer Father    Drug abuse Father    Thyroid disease Sister    Clotting disorder Sister        gene mutation   Breast cancer Neg Hx     Social History         Socioeconomic History   Marital status: Married      Spouse name: Not on file   Number of children: 1   Years of education: Not on file   Highest education level: Not on file  Occupational History   Occupation: customer service rep      Employer: [employer]  Social Needs   Financial resource strain: Not on file   Food insecurity      Worry: Not on file      Inability: Not on file   Transportation needs      Medical: Not on file      Non-medical: Not on file  Tobacco Use   Smoking status: Never Smoker   Smokeless tobacco:  Never Used   Tobacco comment: tobacco use- no   Substance and Sexual Activity   Alcohol use: Yes      Alcohol/week: 0.0 standard drinks      Comment: rare glass of wine.   Drug use: No   Sexual activity: Yes      Birth control/protection: Post-menopausal  Lifestyle   Physical activity      Days per week: Not on file      Minutes per session: Not on file   Stress: Not on file  Relationships   Social connections      Talks on phone: Not on file      Gets together: Not on file      Attends religious service: Not on file      Active member of club or organization: Not on file      Attends meetings of clubs or organizations: Not on file      Relationship status: Not on file   Intimate partner violence      Fear of current or ex partner: Not on file      Emotionally abused: Not on file      Physically abused: Not on file      Forced sexual activity: Not on file  Other Topics Concern   Not on file  Social History Narrative    Lives locally with husband.  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      Current Outpatient Medications:    aspirin EC 81 MG tablet, Take 81 mg by mouth daily., Disp: , Rfl:    Calcium-Vitamin D-Vitamin K (VIACTIV CALCIUM PLUS D PO), , Disp: , Rfl:    cholecalciferol (VITAMIN D) 1000 units tablet, Take 1,000 Units by mouth daily., Disp: , Rfl:    Collagen Hydrolysate POWD, 2 Scoops as directed., Disp: , Rfl:    Multiple Vitamins-Minerals (MULTIVITAMIN PO), Take 1 tablet by mouth daily., Disp: , Rfl:    Probiotic Product (PROBIOTIC PO), Take by mouth., Disp: , Rfl:    Thiamine HCl (B-1 PO), Take 12.5 mg by mouth., Disp: , Rfl:    Wheat Dextrin (BENEFIBER PO), Take 2 Scoops by mouth., Disp: , Rfl:       ROS:  Review of Systems  Constitutional:  Negative for fatigue, fever and unexpected weight change.  Respiratory:  Negative for cough, shortness of breath and wheezing.   Cardiovascular:  Negative for chest pain, palpitations and leg  swelling.   Gastrointestinal:  Positive for constipation. Negative for blood in stool, diarrhea, nausea and vomiting.  Endocrine: Negative for cold intolerance, heat intolerance and polyuria.  Genitourinary:  Positive for dyspareunia and vaginal bleeding. Negative for dysuria, flank pain, frequency, genital sores, hematuria, menstrual problem, pelvic pain, urgency, vaginal discharge and vaginal pain.  Musculoskeletal:  Negative for back pain, joint swelling and myalgias.  Skin:  Negative for rash.  Neurological:  Negative for dizziness, syncope, light-headedness, numbness and headaches.  Hematological:  Negative for adenopathy.  Psychiatric/Behavioral:  Negative for agitation, confusion, sleep disturbance and suicidal ideas. The patient is not nervous/anxious.         Objective: BP 100/76   Ht 5\' 7"  (1.702 m)   BMI 24.59 kg/m Wt 178 (per pt report)   Physical Exam Constitutional:      Appearance: She is well-developed.  Genitourinary:     Vulva normal.     Genitourinary Comments: UTERUS/CX SURG REM     Right Labia: No rash, tenderness or lesions.    Left Labia: No tenderness, lesions or rash.    Vaginal cuff intact.    No vaginal discharge, erythema or tenderness.     Mild vaginal atrophy present.     Right Adnexa: not tender and no mass present.    Left Adnexa: not tender and no mass present.    Cervix is absent.     Uterus is absent.  Breasts:    Right: No mass, nipple discharge, skin change or tenderness.     Left: No mass, nipple discharge, skin change or tenderness.  Neck:     Thyroid: No thyromegaly.  Cardiovascular:     Rate and Rhythm: Normal rate and regular rhythm.     Heart sounds: Normal heart sounds. No murmur heard. Pulmonary:     Effort: Pulmonary effort is normal.     Breath sounds: Normal breath sounds.  Abdominal:     Palpations: Abdomen is soft.     Tenderness: There is no abdominal tenderness. There is no guarding.  Musculoskeletal:        General: Normal  range of motion.     Cervical back: Normal range of motion.  Neurological:     General: No focal deficit present.     Mental Status: She is alert and oriented to person, place, and time.     Cranial Nerves: No cranial nerve deficit.  Skin:    General: Skin is warm and dry.  Psychiatric:        Mood and Affect: Mood normal.        Behavior: Behavior normal.        Thought Content: Thought content normal.        Judgment: Judgment normal.  Vitals reviewed.       Assessment/Plan: Encounter for annual routine gynecological examination  Cervical cancer screening - Plan: IGP, rfx Aptima HPV ASCU  Encounter for screening mammogram for malignant neoplasm of breast; pt current on mammo.   Will order DEXA at appt next yr.   GYN counsel breast self exam, mammography screening, menopause, adequate intake of calcium and vitamin D, diet and exercise       F/U             Return in about 1 year (around 02/25/2020).   Rayquan Amrhein B. Elazar Argabright, PA-C 03/01/2019 10:36 AM

## 2022-12-03 ENCOUNTER — Encounter: Payer: Self-pay | Admitting: Obstetrics and Gynecology

## 2022-12-03 ENCOUNTER — Ambulatory Visit (INDEPENDENT_AMBULATORY_CARE_PROVIDER_SITE_OTHER): Payer: BC Managed Care – PPO | Admitting: Obstetrics and Gynecology

## 2022-12-03 VITALS — BP 110/70 | Ht 67.5 in | Wt 178.0 lb

## 2022-12-03 DIAGNOSIS — Z124 Encounter for screening for malignant neoplasm of cervix: Secondary | ICD-10-CM | POA: Diagnosis not present

## 2022-12-03 DIAGNOSIS — Z1231 Encounter for screening mammogram for malignant neoplasm of breast: Secondary | ICD-10-CM

## 2022-12-03 DIAGNOSIS — Z01419 Encounter for gynecological examination (general) (routine) without abnormal findings: Secondary | ICD-10-CM

## 2022-12-03 NOTE — Patient Instructions (Signed)
I value your feedback and you entrusting us with your care. If you get a [place] patient survey, I would appreciate you taking the time to let us know about your experience today. Thank you! ? ? ?

## 2022-12-06 LAB — IGP, RFX APTIMA HPV ASCU

## 2022-12-16 DIAGNOSIS — M62830 Muscle spasm of back: Secondary | ICD-10-CM | POA: Diagnosis not present

## 2022-12-16 DIAGNOSIS — R519 Headache, unspecified: Secondary | ICD-10-CM | POA: Diagnosis not present

## 2022-12-16 DIAGNOSIS — M9902 Segmental and somatic dysfunction of thoracic region: Secondary | ICD-10-CM | POA: Diagnosis not present

## 2022-12-16 DIAGNOSIS — M9901 Segmental and somatic dysfunction of cervical region: Secondary | ICD-10-CM | POA: Diagnosis not present

## 2022-12-24 DIAGNOSIS — R519 Headache, unspecified: Secondary | ICD-10-CM | POA: Diagnosis not present

## 2022-12-24 DIAGNOSIS — M62830 Muscle spasm of back: Secondary | ICD-10-CM | POA: Diagnosis not present

## 2022-12-24 DIAGNOSIS — M9901 Segmental and somatic dysfunction of cervical region: Secondary | ICD-10-CM | POA: Diagnosis not present

## 2022-12-24 DIAGNOSIS — M9902 Segmental and somatic dysfunction of thoracic region: Secondary | ICD-10-CM | POA: Diagnosis not present

## 2022-12-26 DIAGNOSIS — M62830 Muscle spasm of back: Secondary | ICD-10-CM | POA: Diagnosis not present

## 2022-12-26 DIAGNOSIS — M9902 Segmental and somatic dysfunction of thoracic region: Secondary | ICD-10-CM | POA: Diagnosis not present

## 2022-12-26 DIAGNOSIS — M9901 Segmental and somatic dysfunction of cervical region: Secondary | ICD-10-CM | POA: Diagnosis not present

## 2022-12-26 DIAGNOSIS — R519 Headache, unspecified: Secondary | ICD-10-CM | POA: Diagnosis not present

## 2022-12-27 DIAGNOSIS — M9902 Segmental and somatic dysfunction of thoracic region: Secondary | ICD-10-CM | POA: Diagnosis not present

## 2022-12-27 DIAGNOSIS — M62830 Muscle spasm of back: Secondary | ICD-10-CM | POA: Diagnosis not present

## 2022-12-27 DIAGNOSIS — R519 Headache, unspecified: Secondary | ICD-10-CM | POA: Diagnosis not present

## 2022-12-27 DIAGNOSIS — M9901 Segmental and somatic dysfunction of cervical region: Secondary | ICD-10-CM | POA: Diagnosis not present

## 2022-12-30 DIAGNOSIS — R519 Headache, unspecified: Secondary | ICD-10-CM | POA: Diagnosis not present

## 2022-12-30 DIAGNOSIS — M9902 Segmental and somatic dysfunction of thoracic region: Secondary | ICD-10-CM | POA: Diagnosis not present

## 2022-12-30 DIAGNOSIS — M9901 Segmental and somatic dysfunction of cervical region: Secondary | ICD-10-CM | POA: Diagnosis not present

## 2022-12-30 DIAGNOSIS — M62830 Muscle spasm of back: Secondary | ICD-10-CM | POA: Diagnosis not present

## 2022-12-31 ENCOUNTER — Encounter: Payer: Self-pay | Admitting: Family Medicine

## 2022-12-31 ENCOUNTER — Telehealth: Payer: Self-pay

## 2022-12-31 ENCOUNTER — Ambulatory Visit (INDEPENDENT_AMBULATORY_CARE_PROVIDER_SITE_OTHER): Payer: BC Managed Care – PPO | Admitting: Family Medicine

## 2022-12-31 VITALS — BP 136/86 | HR 76 | Temp 98.2°F

## 2022-12-31 DIAGNOSIS — M9901 Segmental and somatic dysfunction of cervical region: Secondary | ICD-10-CM | POA: Diagnosis not present

## 2022-12-31 DIAGNOSIS — M62830 Muscle spasm of back: Secondary | ICD-10-CM | POA: Diagnosis not present

## 2022-12-31 DIAGNOSIS — R519 Headache, unspecified: Secondary | ICD-10-CM | POA: Diagnosis not present

## 2022-12-31 DIAGNOSIS — M545 Low back pain, unspecified: Secondary | ICD-10-CM | POA: Diagnosis not present

## 2022-12-31 DIAGNOSIS — M9902 Segmental and somatic dysfunction of thoracic region: Secondary | ICD-10-CM | POA: Diagnosis not present

## 2022-12-31 MED ORDER — MECLIZINE HCL 12.5 MG PO TABS: 12.5000 mg | ORAL_TABLET | Freq: Three times a day (TID) | ORAL | 0 refills | Status: DC | PRN

## 2022-12-31 MED ORDER — CYCLOBENZAPRINE HCL 10 MG PO TABS: 5.0000 mg | ORAL_TABLET | Freq: Three times a day (TID) | ORAL | 0 refills | Status: DC | PRN

## 2022-12-31 MED ORDER — HYDROCODONE-ACETAMINOPHEN 5-325 MG PO TABS: 1.0000 | ORAL_TABLET | Freq: Four times a day (QID) | ORAL | 0 refills | Status: AC | PRN

## 2022-12-31 NOTE — Patient Instructions (Signed)
Nice to see you. Please monitor for drowsiness with the pain medicine, muscle relaxer, and meclizine.  If you get excessively drowsy please let us know. Please do not drive if you are drowsy. If your symptoms worsen or are not adequately controlled with this regimen you can go to the [facility] off of [facility] or you can go to the emergency room for further evaluation.

## 2023-01-01 ENCOUNTER — Telehealth: Payer: Self-pay

## 2023-01-01 DIAGNOSIS — M9901 Segmental and somatic dysfunction of cervical region: Secondary | ICD-10-CM | POA: Diagnosis not present

## 2023-01-01 DIAGNOSIS — R519 Headache, unspecified: Secondary | ICD-10-CM | POA: Diagnosis not present

## 2023-01-01 DIAGNOSIS — M545 Low back pain, unspecified: Secondary | ICD-10-CM | POA: Insufficient documentation

## 2023-01-01 DIAGNOSIS — M62830 Muscle spasm of back: Secondary | ICD-10-CM | POA: Diagnosis not present

## 2023-01-01 DIAGNOSIS — M9902 Segmental and somatic dysfunction of thoracic region: Secondary | ICD-10-CM | POA: Diagnosis not present

## 2023-01-01 NOTE — Telephone Encounter (Signed)
F/u   Rtn call back to CMA  The patient voiced she does not do mychart now .

## 2023-01-01 NOTE — Telephone Encounter (Signed)
Left message to call the office back.

## 2023-01-01 NOTE — Telephone Encounter (Signed)
Please call the patient and relay the MyChart message that was sent to her last night.  It looks like she has not reviewed this yet.  Thanks.

## 2023-01-01 NOTE — Progress Notes (Signed)
Marikay Alar, MD Phone: [phone]  Michelle Armstrong is a 62 y.o. female who presents today for same-day visit.  Back pain: Patient notes 9 days ago she was at [facility] [employer] and carried a tote with a comforter and it on her left side.  She subsequently developed left back and side pain.  Her left low back feels like there is a knife going into it.  She was going to the chiropractor and getting massage and notes the chiropractor who advised her she needed to see her primary care doctor.  She has been taking expired Flexeril and Tylenol over-the-counter.  She notes no numbness, weakness, or bowel or bladder incontinence.  She notes radiation of the pain to her upper thigh on the left side.  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  Social History   Tobacco Use  Smoking Status Never  Smokeless Tobacco Never  Tobacco Comments   tobacco use- no     Current Outpatient Medications on File Prior to Visit  Medication Sig Dispense Refill   aspirin EC 81 MG tablet Take 81 mg by mouth daily.     Calcium-Vitamin D-Vitamin K (VIACTIV CALCIUM PLUS D PO)      cholecalciferol (VITAMIN D) 1000 units tablet Take 1,000 Units by mouth daily.     Collagen Hydrolysate POWD 2 Scoops as directed.     Multiple Vitamins-Minerals (MULTIVITAMIN PO) Take 1 tablet by mouth daily.     Probiotic Product (PROBIOTIC PO) Take by mouth.     Thiamine HCl (B-1 PO) Take 12.5 mg by mouth.     Wheat Dextrin (BENEFIBER PO) Take 2 Scoops by mouth.     No current facility-administered medications on file prior to visit.     ROS see history of present illness  Objective  Physical Exam Vitals:   12/31/22 1159  BP: 136/86  Pulse: 76  Temp: 98.2 F (36.8 C)  SpO2: 97%    BP Readings from Last 3 Encounters:  12/31/22 136/86  12/03/22 110/70  04/24/22 118/78   Wt Readings from Last 3 Encounters:  12/03/22 168 lb (76.2 kg)  04/24/22 165 lb (74.8 kg)  11/29/21 165 lb (74.8  kg)    Physical Exam Constitutional:      General: She is not in acute distress.    Appearance: She is not diaphoretic.  Pulmonary:     Effort: Pulmonary effort is normal.  Musculoskeletal:     Comments: No midline spine tenderness, no midline spine step-off, there is left lumbar muscular back tenderness and also tenderness around to the left side around her iliac crest  Skin:    General: Skin is warm and dry.  Neurological:     Mental Status: She is alert.     Comments: 5/5 strength bilateral quads, hamstrings, plantarflexion, and dorsiflexion, sensation light touch intact bilateral lower extremities      Assessment/Plan: Please see individual problem list.  Acute left-sided low back pain without sciatica Assessment & Plan: Patient with acute onset left sided low back discomfort.  I initially discussed a trial of a steroid or an injectable steroid with the patient though after she reminded me of her prior NSTEMI with steroids we opted against that.  Discussed given that history I would not give her a Toradol injection either given the risk of that injection for cardiac issues.  I discussed at this point it would be pain control as the main goal.  I initially discussed tramadol with her  though she noted in the past she had issues with that.  During the discussion regarding tramadol I did advise she could take Tylenol 1000 mg every 6 hours.  I eventually came to the determination that Vicodin might be the best option for pain control given her history.  She can take the Vicodin as prescribed.  She will monitor for drowsiness with this.  I will also prescribe Flexeril for her to take as prescribed below.  She will monitor for drowsiness with the Flexeril.  She will not take meclizine with the Flexeril.  She notes in the past she has been given meclizine for nausea that occurs with pain medication.  Later in the day after the patient had left the office I realized I had not revised my Tylenol  instructions to her given that I prescribed Vicodin.  I sent a MyChart message to the patient with instructions on Tylenol use while on Vicodin and will have my staff contact her to make sure she got that message.  Discussed at this point if these things did not work to help with her pain she could try going to [facility]-in or the emergency department for more help with pain control.  Discussed I did not have any injectable medication in the office that I could give her with her history.  Orders: -     Meclizine HCl; Take 1 tablet (12.5 mg total) by mouth 3 (three) times daily as needed for nausea.  Dispense: 30 tablet; Refill: 0 -     HYDROcodone-Acetaminophen; Take 1 tablet by mouth every 6 (six) hours as needed for up to 5 days for moderate pain.  Dispense: 15 tablet; Refill: 0 -     Cyclobenzaprine HCl; Take 0.5-1 tablets (5-10 mg total) by mouth 3 (three) times daily as needed for muscle spasms.  Dispense: 30 tablet; Refill: 0    Return in about 4 weeks (around 01/28/2023), or if symptoms worsen or fail to improve, for Back pain.  I have spent 32 minutes in the care of this patient regarding history taking, documentation, completion of exam, discussion of plan, placing orders.   Marikay Alar, MD [facility]

## 2023-01-01 NOTE — Assessment & Plan Note (Signed)
Patient with acute onset left sided low back discomfort.  I initially discussed a trial of a steroid or an injectable steroid with the patient though after she reminded me of her prior NSTEMI with steroids we opted against that.  Discussed given that history I would not give her a Toradol injection either given the risk of that injection for cardiac issues.  I discussed at this point it would be pain control as the main goal.  I initially discussed tramadol with her though she noted in the past she had issues with that.  During the discussion regarding tramadol I did advise she could take Tylenol 1000 mg every 6 hours.  I eventually came to the determination that Vicodin might be the best option for pain control given her history.  She can take the Vicodin as prescribed.  She will monitor for drowsiness with this.  I will also prescribe Flexeril for her to take as prescribed below.  She will monitor for drowsiness with the Flexeril.  She will not take meclizine with the Flexeril.  She notes in the past she has been given meclizine for nausea that occurs with pain medication.  Later in the day after the patient had left the office I realized I had not revised my Tylenol instructions to her given that I prescribed Vicodin.  I sent a MyChart message to the patient with instructions on Tylenol use while on Vicodin and will have my staff contact her to make sure she got that message.  Discussed at this point if these things did not work to help with her pain she could try going to [facility] or the emergency department for more help with pain control.  Discussed I did not have any injectable medication in the office that I could give her with her history.

## 2023-01-01 NOTE — Telephone Encounter (Signed)
Left message to call the office back regarding Dr. Purvis Sheffield unread my chart message

## 2023-01-02 DIAGNOSIS — M9902 Segmental and somatic dysfunction of thoracic region: Secondary | ICD-10-CM | POA: Diagnosis not present

## 2023-01-02 DIAGNOSIS — M62830 Muscle spasm of back: Secondary | ICD-10-CM | POA: Diagnosis not present

## 2023-01-02 DIAGNOSIS — R519 Headache, unspecified: Secondary | ICD-10-CM | POA: Diagnosis not present

## 2023-01-02 DIAGNOSIS — M9901 Segmental and somatic dysfunction of cervical region: Secondary | ICD-10-CM | POA: Diagnosis not present

## 2023-01-02 NOTE — Telephone Encounter (Signed)
F/u    Patient giving permission to leave a details message on voicemail.

## 2023-01-02 NOTE — Telephone Encounter (Signed)
Patient called back she is cutting the hydrocodone in half and wants to know how much Tylenol can she take with that?

## 2023-01-02 NOTE — Telephone Encounter (Signed)
Already spoke to Patient regarding Dr Purvis Sheffield recommendaitons

## 2023-01-02 NOTE — Telephone Encounter (Signed)
Spoke to Patient twice and sent a my chart message.

## 2023-01-02 NOTE — Telephone Encounter (Signed)
Spoke to Patient with Dr. Purvis Sheffield message. She states she is not taking it 4 times a day and that Dr. Birdie Sons knows that because she told him she was cutting them in half. Patient was a little ill.

## 2023-01-02 NOTE — Telephone Encounter (Signed)
Spoke to Patient again she wrote it down that time the first time she was driving.

## 2023-01-02 NOTE — Telephone Encounter (Signed)
Pt called Lanora Manis CMA back regarding how to take her med.

## 2023-01-09 IMAGING — CR DG CHEST 2V
2 series · 2 of 2 positions shown · non-contrast
Comparison: 02/23/2017 and prior studies

CLINICAL DATA: Acute chest pain.

EXAM:
CHEST - 2 VIEW

[chest lat]
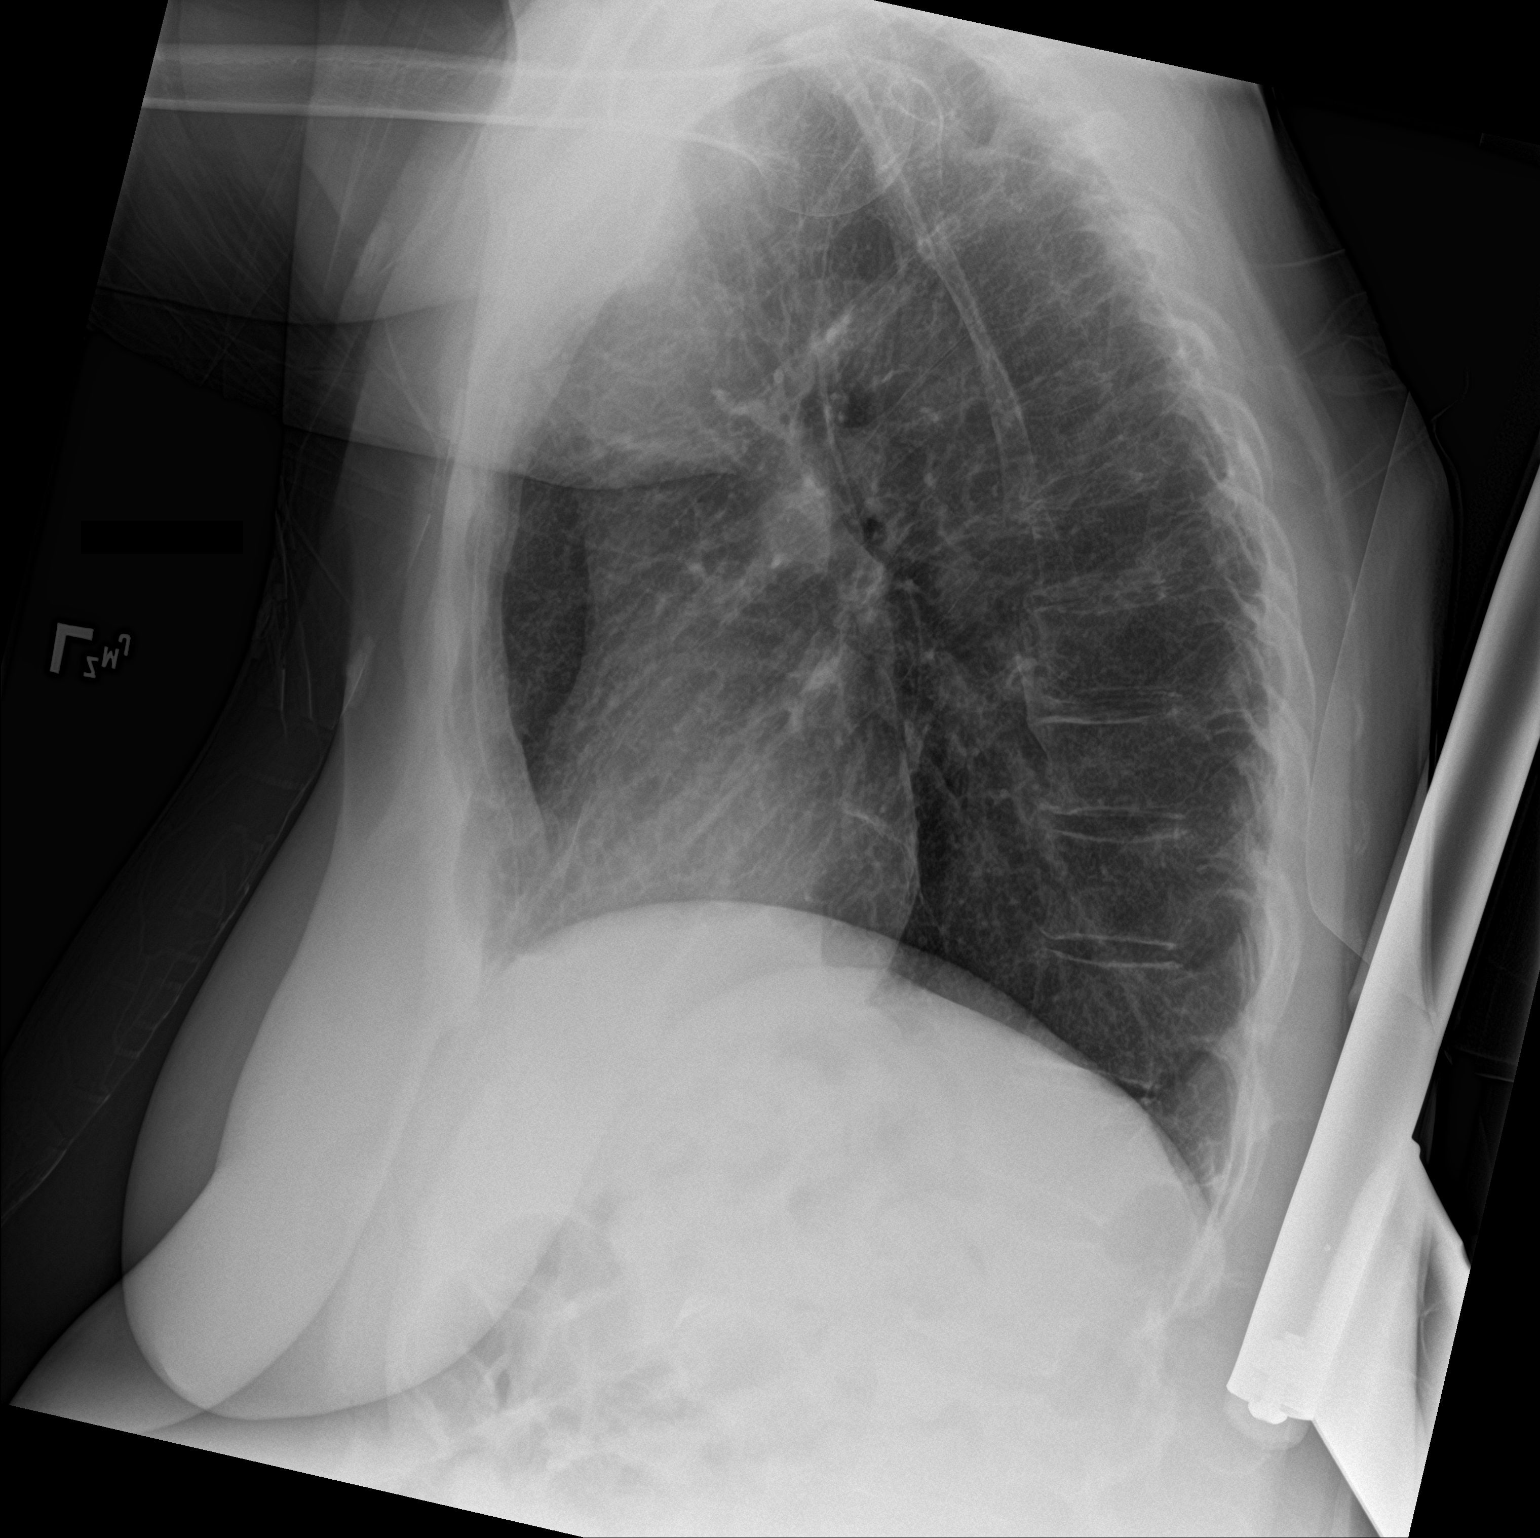

[chest ap]
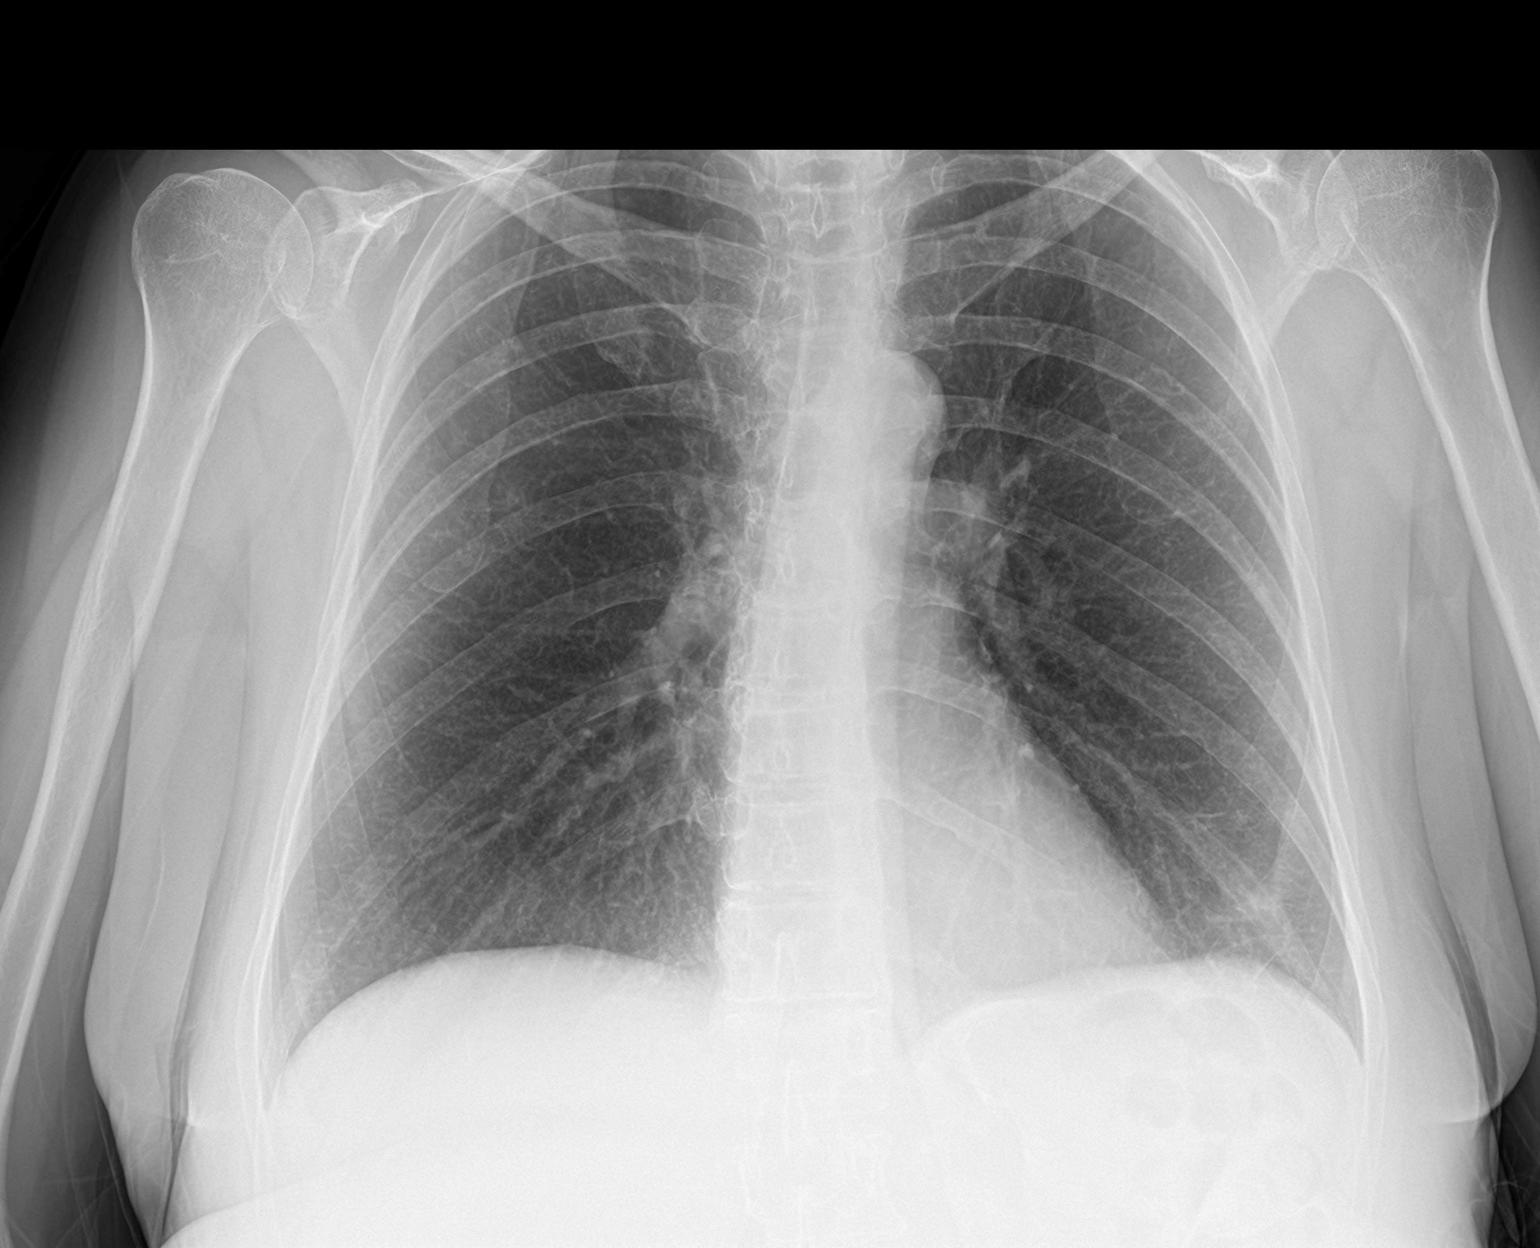

[2 of 2 positions shown; findings below may reference images not displayed]

FINDINGS: The cardiomediastinal silhouette is unremarkable.

Mild LEFT basilar scarring again noted.

There is no evidence of focal airspace disease, pulmonary edema,
suspicious pulmonary nodule/mass, pleural effusion, or pneumothorax.

No acute bony abnormalities are identified.
IMPRESSION: No active cardiopulmonary disease.

## 2023-01-09 IMAGING — CT CT ANGIO CHEST
2 of 6 series · 18 of 46 positions shown · IV contrast (APPLIED)
Comparison: August 09, 2011

CLINICAL DATA: Left-sided chest pain.

EXAM:
CT ANGIOGRAPHY CHEST WITH CONTRAST
TECHNIQUE: Multidetector CT imaging of the chest was performed using the
standard protocol during bolus administration of intravenous
contrast. Multiplanar CT image reconstructions and MIPs were
obtained to evaluate the vascular anatomy.
CONTRAST:  75mL OMNIPAQUE IOHEXOL 350 MG/ML SOLN

[Series 5: thins · axial · 0.73mm/px · z∈[-776,-484]mm · 15 of 399 slices shown]
[im 17/399  lung]
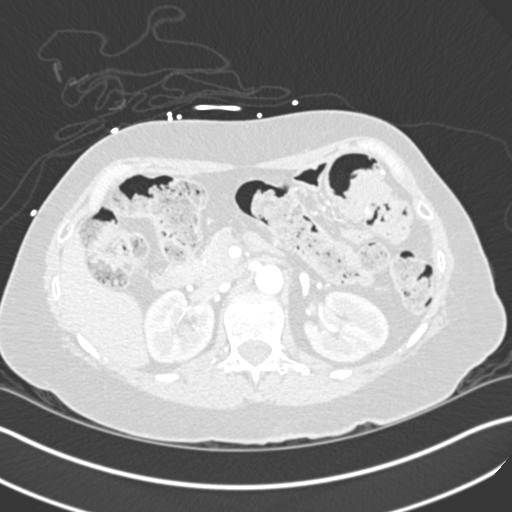
[im 50/399  soft-tissue]
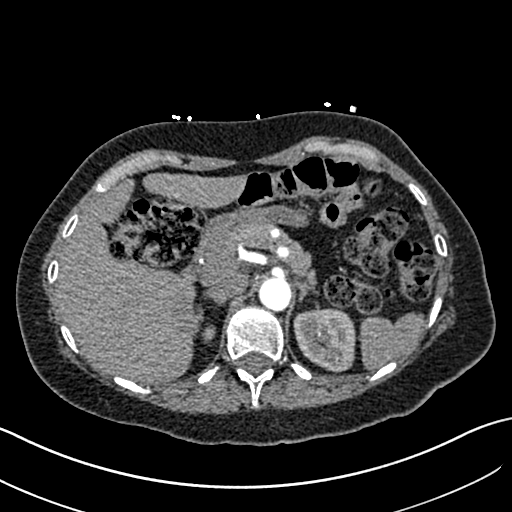
[im 67/399  lung]
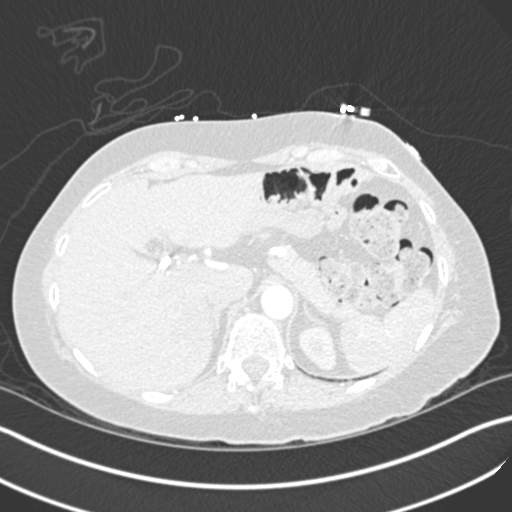
[im 100/399  soft-tissue]
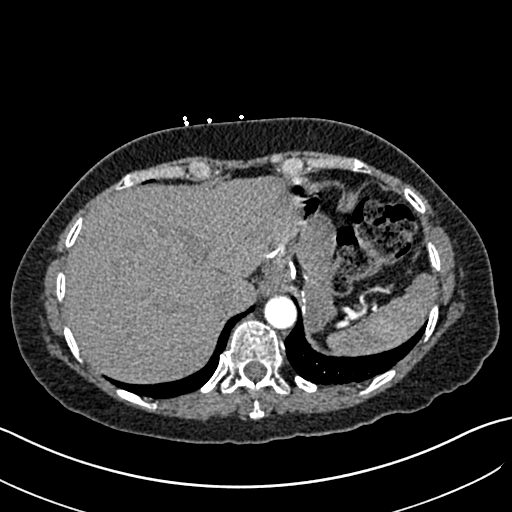
[im 117/399  lung]
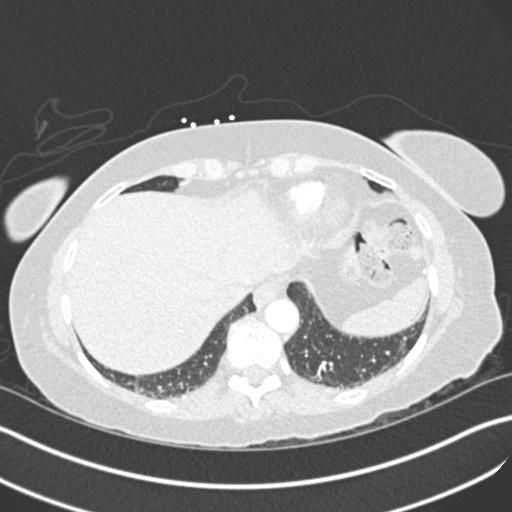
[im 150/399  soft-tissue]
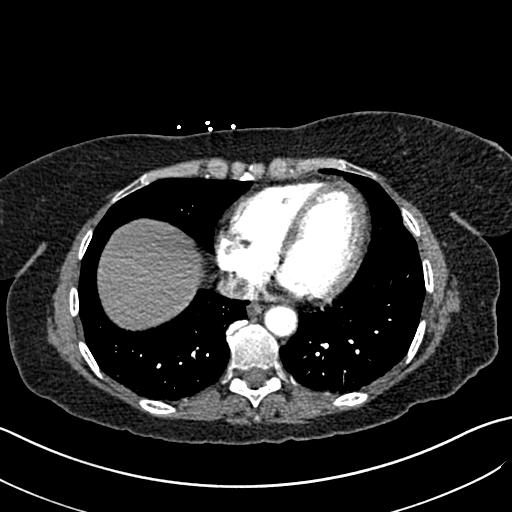
[im 166/399  lung]
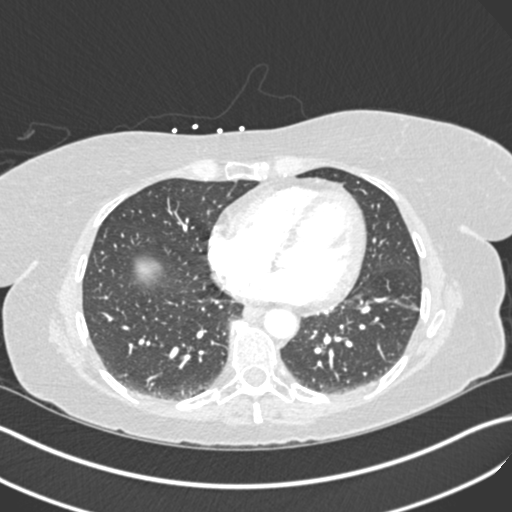
[im 200/399  soft-tissue]
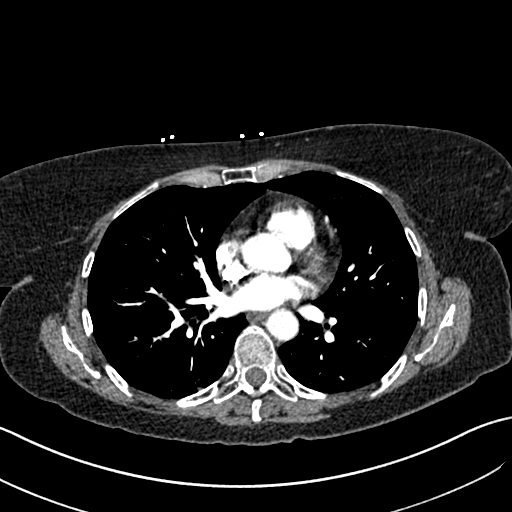
[im 233/399  lung]
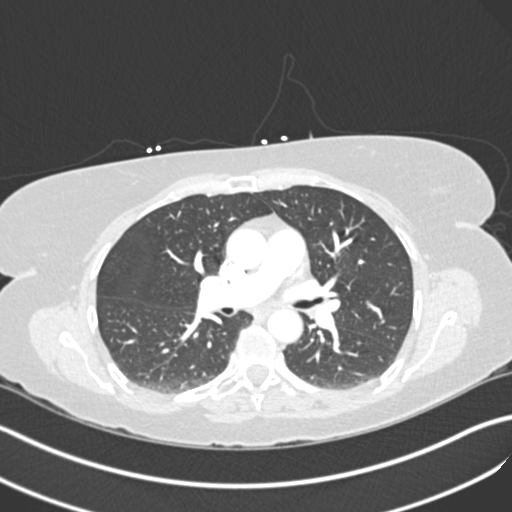
[im 249/399  soft-tissue]
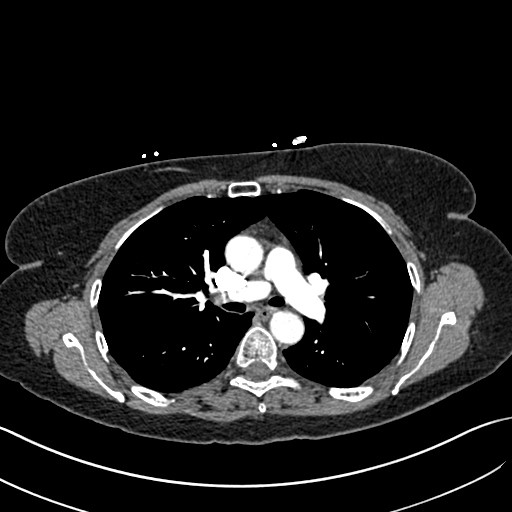
[im 282/399  lung]
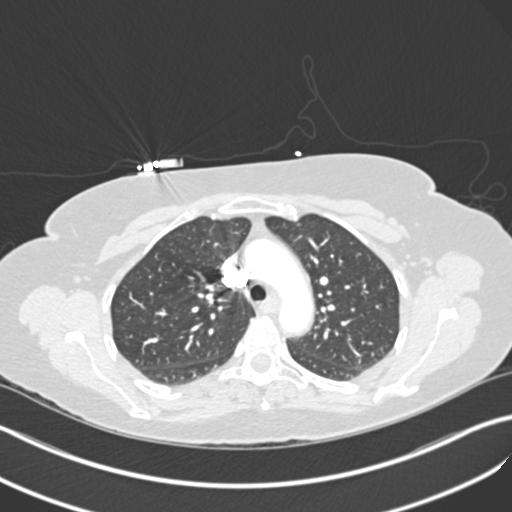
[im 299/399  soft-tissue]
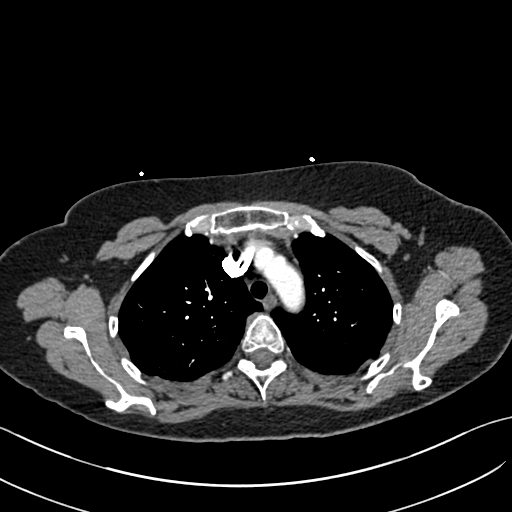
[im 332/399  lung]
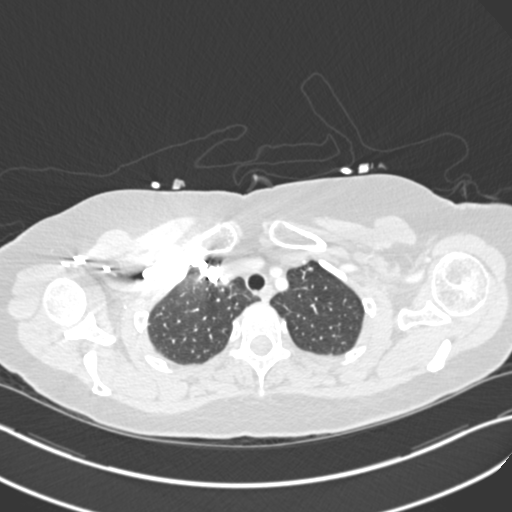
[im 349/399  soft-tissue]
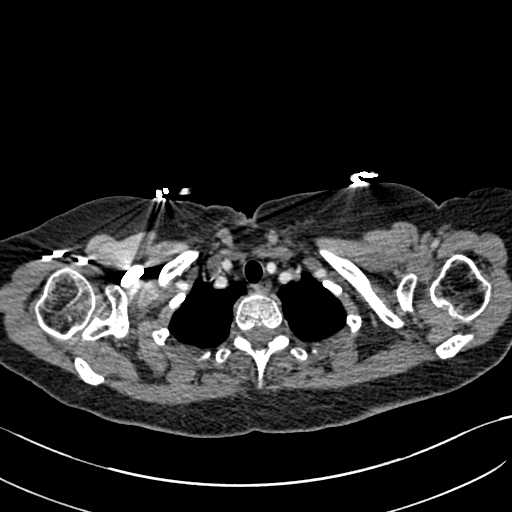
[im 382/399  lung]
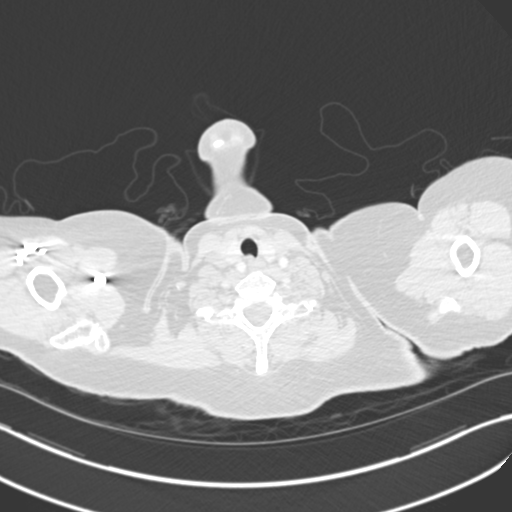

[Series 7: coronal mpr · coronal · 0.56mm/px · 3 of 86 slices shown]
[im 22/86  soft-tissue]
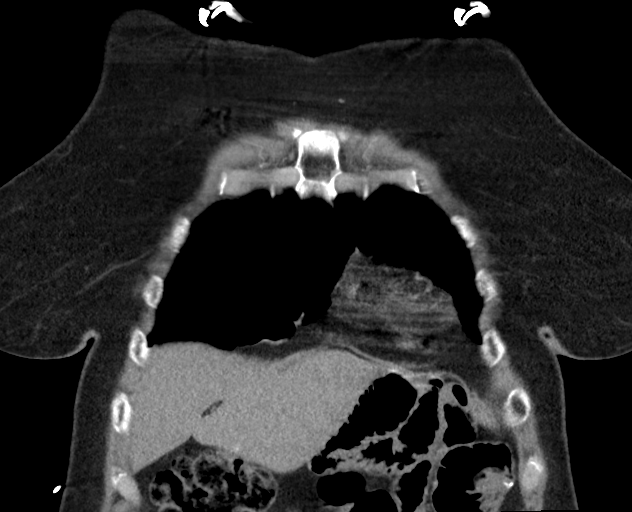
[im 43/86  soft-tissue]
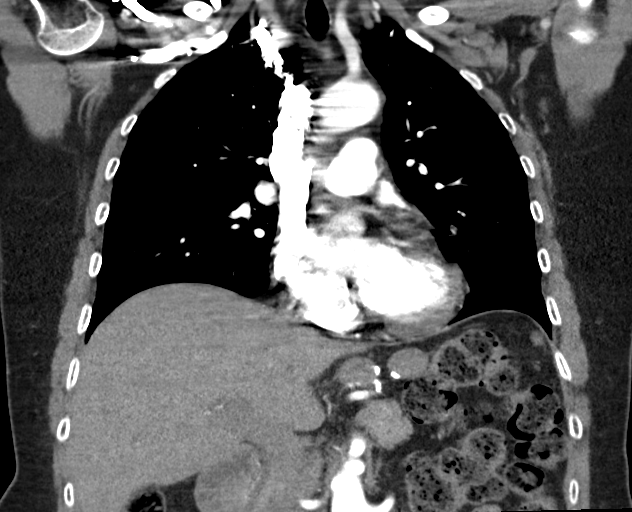
[im 64/86  soft-tissue]
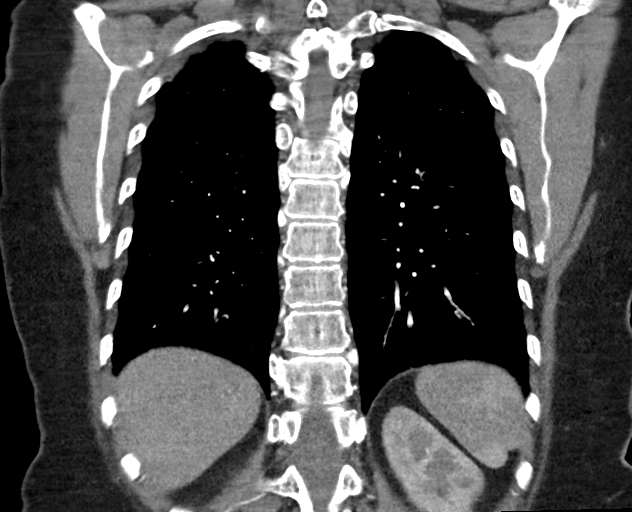

[18 of 46 positions shown; findings below may reference images not displayed]

FINDINGS: Cardiovascular: Satisfactory opacification of the pulmonary arteries
to the segmental level. No evidence of pulmonary embolism. Normal
heart size. No pericardial effusion.

Mediastinum/Nodes: No enlarged mediastinal, hilar, or axillary lymph
nodes. Thyroid gland, trachea, and esophagus demonstrate no
significant findings.

Lungs/Pleura: A 4 mm noncalcified lung nodule is seen within the
posterior aspect of the right lower lobe (axial CT image 61, CT
series 6). This represents a new finding when compared to the prior
study.

There is no evidence of acute infiltrate, pleural effusion or
pneumothorax.

Upper Abdomen: Surgical clips are seen within the gallbladder fossa.

Surgical sutures are noted within the gastric region.

Musculoskeletal: No chest wall abnormality. No acute or significant
osseous findings.

Review of the MIP images confirms the above findings.
IMPRESSION: 1. No CT evidence of pulmonary embolism or other acute intrathoracic
process.
2. 4 mm noncalcified lung nodule within the posterior aspect of the
right lower lobe. No follow-up needed if patient is low-risk.
Non-contrast chest CT can be considered in 12 months if patient is
high-risk. This recommendation follows the consensus statement:
Guidelines for Management of Incidental Pulmonary Nodules Detected
3. Evidence of prior cholecystectomy and gastric surgery.

## 2023-01-13 DIAGNOSIS — R519 Headache, unspecified: Secondary | ICD-10-CM | POA: Diagnosis not present

## 2023-01-13 DIAGNOSIS — M9902 Segmental and somatic dysfunction of thoracic region: Secondary | ICD-10-CM | POA: Diagnosis not present

## 2023-01-13 DIAGNOSIS — M62830 Muscle spasm of back: Secondary | ICD-10-CM | POA: Diagnosis not present

## 2023-01-13 DIAGNOSIS — M9901 Segmental and somatic dysfunction of cervical region: Secondary | ICD-10-CM | POA: Diagnosis not present

## 2023-01-15 NOTE — Progress Notes (Unsigned)
Bethanie Dicker, NP-C Phone: [phone]  Michelle Armstrong is a 62 y.o. female who presents today for headache, chills and sore throat.  Patient reports symptoms since Tuesday evening. Respiratory illness:  Cough- Yes  Congestion-    Sinus- Yes   Chest- No  Post nasal drip- Yes  Sore throat- Yes  Shortness of breath- Mild, on exertion  Fever- Yes  Fatigue/Myalgia- Yes Headache- Yes Nausea/Vomiting- Nausea only Taste disturbance- No  Smell disturbance- No  Covid exposure- No  Covid vaccination- x 2  Flu vaccination- Not due  Medications- Tylenol   Social History   Tobacco Use  Smoking Status Never  Smokeless Tobacco Never  Tobacco Comments   tobacco use- no     Current Outpatient Medications on File Prior to Visit  Medication Sig Dispense Refill   aspirin EC 81 MG tablet Take 81 mg by mouth daily.     Calcium-Vitamin D-Vitamin K (VIACTIV CALCIUM PLUS D PO)      cholecalciferol (VITAMIN D) 1000 units tablet Take 1,000 Units by mouth daily.     Collagen Hydrolysate POWD 2 Scoops as directed.     cyclobenzaprine (FLEXERIL) 10 MG tablet Take 0.5-1 tablets (5-10 mg total) by mouth 3 (three) times daily as needed for muscle spasms. 30 tablet 0   meclizine (ANTIVERT) 12.5 MG tablet Take 1 tablet (12.5 mg total) by mouth 3 (three) times daily as needed for nausea. 30 tablet 0   Multiple Vitamins-Minerals (MULTIVITAMIN PO) Take 1 tablet by mouth daily.     Probiotic Product (PROBIOTIC PO) Take by mouth.     Thiamine HCl (B-1 PO) Take 12.5 mg by mouth.     Wheat Dextrin (BENEFIBER PO) Take 2 Scoops by mouth.     No current facility-administered medications on file prior to visit.    ROS see history of present illness  Objective  Physical Exam Vitals:   01/16/23 0819  BP: 100/68  Pulse: (!) 107  Temp: (!) 102.2 F (39 C)  SpO2: 95%    BP Readings from Last 3 Encounters:  01/16/23 100/68  12/31/22 136/86  12/03/22 110/70   Wt Readings from Last 3  Encounters:  12/03/22 168 lb (76.2 kg)  04/24/22 165 lb (74.8 kg)  11/29/21 165 lb (74.8 kg)    Physical Exam Constitutional:      General: She is not in acute distress.    Appearance: She is ill-appearing.  HENT:     Head: Normocephalic.     Right Ear: Tympanic membrane normal.     Left Ear: Tympanic membrane normal.     Nose: Nose normal.     Mouth/Throat:     Mouth: Mucous membranes are moist.     Pharynx: Oropharynx is clear.  Eyes:     Conjunctiva/sclera: Conjunctivae normal.     Pupils: Pupils are equal, round, and reactive to light.  Cardiovascular:     Rate and Rhythm: Normal rate and regular rhythm.     Heart sounds: Normal heart sounds.  Pulmonary:     Effort: Pulmonary effort is normal.     Breath sounds: Normal breath sounds.  Lymphadenopathy:     Cervical: No cervical adenopathy.  Skin:    General: Skin is warm and dry.  Neurological:     General: No focal deficit present.     Mental Status: She is alert.  Psychiatric:        Mood and Affect: Mood normal.        Behavior: Behavior normal.  Assessment/Plan: Please see individual problem list.  COVID-19 Assessment & Plan: COVID test in office positive. Patient interested in Paxlovid treatment. She is on Day 3 of symptoms, GFR 88, will treat with Paxlovid. Counseled on side effects. Advised if any adverse reaction occurs to stop the medication immediately. Discussed with patient that this medication may lessen the severity and duration of symptoms. She will continue Tylenol for her fever. Advised to increase fluid intake. Counseled on quarantine protocol. Strict precautions given to patient.   Orders: -     nirmatrelvir/ritonavir; Take 3 tablets by mouth 2 (two) times daily for 5 days. (Take nirmatrelvir 150 mg two tablets twice daily for 5 days and ritonavir 100 mg one tablet twice daily for 5 days) Patient GFR is 88  Dispense: 30 tablet; Refill: 0  Encounter for screening for COVID-19 -     POC COVID-19  BinaxNow  Flu-like symptoms -     POCT Influenza A/B   Return if symptoms worsen or fail to improve.   Bethanie Dicker, NP-C [facility] [employer]

## 2023-01-16 ENCOUNTER — Ambulatory Visit (INDEPENDENT_AMBULATORY_CARE_PROVIDER_SITE_OTHER): Payer: BC Managed Care – PPO | Admitting: Nurse Practitioner

## 2023-01-16 ENCOUNTER — Telehealth: Payer: Self-pay | Admitting: Family Medicine

## 2023-01-16 ENCOUNTER — Encounter: Payer: Self-pay | Admitting: Nurse Practitioner

## 2023-01-16 VITALS — BP 100/68 | HR 107 | Temp 102.2°F | Ht 67.5 in

## 2023-01-16 DIAGNOSIS — U071 COVID-19: Secondary | ICD-10-CM | POA: Diagnosis not present

## 2023-01-16 DIAGNOSIS — Z1152 Encounter for screening for COVID-19: Secondary | ICD-10-CM | POA: Diagnosis not present

## 2023-01-16 DIAGNOSIS — R6889 Other general symptoms and signs: Secondary | ICD-10-CM

## 2023-01-16 LAB — POCT INFLUENZA A/B
Influenza A, POC: NEGATIVE
Influenza B, POC: NEGATIVE

## 2023-01-16 LAB — POC COVID19 BINAXNOW: SARS Coronavirus 2 Ag: POSITIVE — AB

## 2023-01-16 MED ORDER — NIRMATRELVIR/RITONAVIR (PAXLOVID)TABLET: 3.0000 | ORAL_TABLET | Freq: Two times a day (BID) | ORAL | 0 refills | Status: AC

## 2023-01-16 NOTE — Telephone Encounter (Signed)
Pt called stating after she took the paxlovid  two hours ago she has a bitter taste in her mouth and a bad pain two inches above her belly button

## 2023-01-16 NOTE — Telephone Encounter (Signed)
Pt has been informed pt stated she can not get the taste out of her mouth and that she has drank water and its still there she states she is gaging because its nasty

## 2023-01-16 NOTE — Assessment & Plan Note (Addendum)
COVID test in office positive. Patient interested in Paxlovid treatment. She is on Day 3 of symptoms, GFR 88, will treat with Paxlovid. Counseled on side effects. Advised if any adverse reaction occurs to stop the medication immediately. Discussed with patient that this medication may lessen the severity and duration of symptoms. She will continue Tylenol for her fever. Advised to increase fluid intake. Counseled on quarantine protocol. Strict precautions given to patient.

## 2023-01-17 NOTE — Telephone Encounter (Signed)
Pt was notified on yesterday's call that she can stop the medication if she would like but  that it was a common side affect , pt stated she wish she knew that before taking it and stated she may stop because its nasty.

## 2023-02-17 ENCOUNTER — Telehealth: Payer: Self-pay | Admitting: Family Medicine

## 2023-02-17 NOTE — Telephone Encounter (Signed)
Called patient and advised form ( Request for Amendment of Health Information) needed to be completed to have health information changed in the medical record. Patient had given specific notes to front office requesting changes to the medical record for visit  on 03/24/2018. Patient was advised form with patient notes would be placed at front desk for pickup, along with where the request should be sent. Form and record in front desk folder .

## 2023-03-17 DIAGNOSIS — M9902 Segmental and somatic dysfunction of thoracic region: Secondary | ICD-10-CM | POA: Diagnosis not present

## 2023-03-17 DIAGNOSIS — M9901 Segmental and somatic dysfunction of cervical region: Secondary | ICD-10-CM | POA: Diagnosis not present

## 2023-03-17 DIAGNOSIS — R519 Headache, unspecified: Secondary | ICD-10-CM | POA: Diagnosis not present

## 2023-03-17 DIAGNOSIS — M62830 Muscle spasm of back: Secondary | ICD-10-CM | POA: Diagnosis not present

## 2023-04-01 NOTE — Telephone Encounter (Addendum)
Patient returned call after identifying patient by name and date of birth , patient explained reason for call that she had called HIM and was advised the request to change medical record was sent to [facility] not Dr. Darrick Huntsman. I explained Dr. Darrick Huntsman had received the request but was denying the request because its been over five years since this visit and she only has her documentation of what was treated and diagnosed on "03/24/2018 see provider notes". Patient ask that provider be ask a second time to alter her notes from that visit. I advised I would ask but that it was up to provider. I also informed patient I would have HIM email me as well the addendum request from patient. Patient explained she needed this changed due to on going case and that lawyer had advised she needed this changed.

## 2023-04-01 NOTE — Telephone Encounter (Signed)
Left message to return my call.  

## 2023-04-01 NOTE — Telephone Encounter (Signed)
Patient called and would like to speak to manager. Patient states she called Medical Records and they told her the Amendment of Health Information has been sent to [facility]. She would like to speak to Production designer, theatre/television/film.

## 2023-04-01 NOTE — Telephone Encounter (Signed)
Response has been sent to HIM

## 2023-04-02 NOTE — Telephone Encounter (Addendum)
Called patient and advised patient that MD would not agree to change documentation from 5 years previous. Patient responded and she stated not even with the documentation from podiatry I advised that was another providers DX not the DX at the time with her visit with provider in this office.Providers decision was not to change documentation.

## 2023-04-11 NOTE — Telephone Encounter (Signed)
Pt called stating she would like to talk to the assistant director Lea. I asked the pt what the call was regarding and she stated that she just want to voice some concerns in this office. Pt stated everyone in the office has been wonderful but she wanted to talk to [facility] (supervisor over the office) and Lea stated she will call the pt on Monday. I call the pt back and told the pt and she stated OK

## 2023-04-15 NOTE — Telephone Encounter (Signed)
Patient called and stated that Lea was suppose to call her yesterday, 04/14/2023. She did not receive a call from her. Front office verified patient's phone number and advised to call office back tomorrow if Clint Lipps has not called her today.

## 2023-04-17 NOTE — Telephone Encounter (Signed)
Pt called stating she was expecting a call  on Friday from the supervisor over the office and she was told that Clint Lipps will give her a call on Monday but the pt has not received call yet. I apologize to the pt and let her know that Clint Lipps has been in meetings all this week and I will let her know that you called back

## 2023-04-17 NOTE — Telephone Encounter (Signed)
I called patient to discuss her request for medical record amendment. She disagrees with the decision of not amending the medical record.I explained that she would receive a letter from medical records with next steps on the denial. Patient was adamant that I send a message asking for the provider to review the records from [facility] and to let the provider know that she has pictures of her toes from the visit that she has requested the amendment. I reiterated that there are steps to the policy and that she submitted that request and the provider reviewed the medical record and made the decision not to amend the record. She will be receiving a letter and it will give her next steps. She did state that she was recording the call.

## 2023-04-21 DIAGNOSIS — M9901 Segmental and somatic dysfunction of cervical region: Secondary | ICD-10-CM | POA: Diagnosis not present

## 2023-04-21 DIAGNOSIS — R519 Headache, unspecified: Secondary | ICD-10-CM | POA: Diagnosis not present

## 2023-04-21 DIAGNOSIS — M62830 Muscle spasm of back: Secondary | ICD-10-CM | POA: Diagnosis not present

## 2023-04-21 DIAGNOSIS — M9902 Segmental and somatic dysfunction of thoracic region: Secondary | ICD-10-CM | POA: Diagnosis not present

## 2023-04-28 ENCOUNTER — Other Ambulatory Visit: Payer: Self-pay | Admitting: Dermatology

## 2023-04-28 ENCOUNTER — Ambulatory Visit (INDEPENDENT_AMBULATORY_CARE_PROVIDER_SITE_OTHER): Payer: BC Managed Care – PPO | Admitting: Dermatology

## 2023-04-28 ENCOUNTER — Encounter: Payer: Self-pay | Admitting: Dermatology

## 2023-04-28 VITALS — BP 113/75 | HR 81

## 2023-04-28 DIAGNOSIS — L905 Scar conditions and fibrosis of skin: Secondary | ICD-10-CM

## 2023-04-28 DIAGNOSIS — D2239 Melanocytic nevi of other parts of face: Secondary | ICD-10-CM

## 2023-04-28 DIAGNOSIS — D485 Neoplasm of uncertain behavior of skin: Secondary | ICD-10-CM

## 2023-04-28 DIAGNOSIS — D225 Melanocytic nevi of trunk: Secondary | ICD-10-CM | POA: Diagnosis not present

## 2023-04-28 DIAGNOSIS — Z1283 Encounter for screening for malignant neoplasm of skin: Secondary | ICD-10-CM

## 2023-04-28 DIAGNOSIS — L858 Other specified epidermal thickening: Secondary | ICD-10-CM

## 2023-04-28 DIAGNOSIS — L814 Other melanin hyperpigmentation: Secondary | ICD-10-CM

## 2023-04-28 DIAGNOSIS — L578 Other skin changes due to chronic exposure to nonionizing radiation: Secondary | ICD-10-CM

## 2023-04-28 DIAGNOSIS — W908XXA Exposure to other nonionizing radiation, initial encounter: Secondary | ICD-10-CM | POA: Diagnosis not present

## 2023-04-28 DIAGNOSIS — Z872 Personal history of diseases of the skin and subcutaneous tissue: Secondary | ICD-10-CM

## 2023-04-28 DIAGNOSIS — D492 Neoplasm of unspecified behavior of bone, soft tissue, and skin: Secondary | ICD-10-CM

## 2023-04-28 DIAGNOSIS — L219 Seborrheic dermatitis, unspecified: Secondary | ICD-10-CM

## 2023-04-28 DIAGNOSIS — D229 Melanocytic nevi, unspecified: Secondary | ICD-10-CM

## 2023-04-28 DIAGNOSIS — L821 Other seborrheic keratosis: Secondary | ICD-10-CM

## 2023-04-28 NOTE — Patient Instructions (Addendum)
Recommend starting moisturizer with exfoliant (Urea, Salicylic acid, or Lactic acid) one to two times daily to help smooth rough and bumpy skin.  OTC options include Cetaphil Rough and Bumpy lotion (Urea), Eucerin Roughness Relief lotion or spot treatment cream (Urea), CeraVe SA lotion/cream for Rough and Bumpy skin (Sal Acid), Gold Bond Rough and Bumpy cream (Sal Acid), and AmLactin 12% lotion/cream (Lactic Acid).  If applying in morning, also apply sunscreen to sun-exposed areas, since these exfoliating moisturizers can increase sensitivity to sun.    Melanoma ABCDEs  Melanoma is the most dangerous type of skin cancer, and is the leading cause of death from skin disease.  You are more likely to develop melanoma if you: Have light-colored skin, light-colored eyes, or red or blond hair Spend a lot of time in the sun Tan regularly, either outdoors or in a tanning bed Have had blistering sunburns, especially during childhood Have a close family member who has had a melanoma Have atypical moles or large birthmarks  Early detection of melanoma is key since treatment is typically straightforward and cure rates are extremely high if we catch it early.   The first sign of melanoma is often a change in a mole or a new dark spot.  The ABCDE system is a way of remembering the signs of melanoma.  A for asymmetry:  The two halves do not match. B for border:  The edges of the growth are irregular. C for color:  A mixture of colors are present instead of an even brown color. D for diameter:  Melanomas are usually (but not always) greater than 6mm - the size of a pencil eraser. E for evolution:  The spot keeps changing in size, shape, and color.  Please check your skin once per month between visits. You can use a small mirror in front and a large mirror behind you to keep an eye on the back side or your body.   If you see any new or changing lesions before your next follow-up, please call to schedule a  visit.  Please continue daily skin protection including broad spectrum sunscreen SPF 30+ to sun-exposed areas, reapplying every 2 hours as needed when you're outdoors.   Staying in the shade or wearing long sleeves, sun glasses (UVA+UVB protection) and wide brim hats (4-inch brim around the entire circumference of the hat) are also recommended for sun protection.    Seborrheic Keratosis  What causes seborrheic keratoses? Seborrheic keratoses are harmless, common skin growths that first appear during adult life.  As time goes by, more growths appear.  Some people may develop a large number of them.  Seborrheic keratoses appear on both covered and uncovered body parts.  They are not caused by sunlight.  The tendency to develop seborrheic keratoses can be inherited.  They vary in color from skin-colored to gray, brown, or even black.  They can be either smooth or have a rough, warty surface.   Seborrheic keratoses are superficial and look as if they were stuck on the skin.  Under the microscope this type of keratosis looks like layers upon layers of skin.  That is why at times the top layer may seem to fall off, but the rest of the growth remains and re-grows.    Treatment Seborrheic keratoses do not need to be treated, but can easily be removed in the office.  Seborrheic keratoses often cause symptoms when they rub on clothing or jewelry.  Lesions can be in the way of shaving.  If they become inflamed, they can cause itching, soreness, or burning.  Removal of a seborrheic keratosis can be accomplished by freezing, burning, or surgery. If any spot bleeds, scabs, or grows rapidly, please return to have it checked, as these can be an indication of a skin cancer.  Due to recent changes in healthcare laws, you may see results of your pathology and/or laboratory studies on MyChart before the doctors have had a chance to review them. We understand that in some cases there may be results that are confusing or  concerning to you. Please understand that not all results are received at the same time and often the doctors may need to interpret multiple results in order to provide you with the best plan of care or course of treatment. Therefore, we ask that you please give [place] 2 business days to thoroughly review all your results before contacting the office for clarification. Should we see a critical lab result, you will be contacted sooner.   If You Need Anything After Your Visit  If you have any questions or concerns for your doctor, please call our main line at [phone] and press option 4 to reach your doctor's medical assistant. If no one answers, please leave a voicemail as directed and we will return your call as soon as possible. Messages left after 4 pm will be answered the following business day.   You may also send [place] a message via MyChart. We typically respond to MyChart messages within 1-2 business days.  For prescription refills, please ask your pharmacy to contact our office. Our fax number is [phone].  If you have an urgent issue when the clinic is closed that cannot wait until the next business day, you can page your doctor at the number below.    Please note that while we do our best to be available for urgent issues outside of office hours, we are not available 24/7.   If you have an urgent issue and are unable to reach [place], you may choose to seek medical care at your doctor's office, retail clinic, urgent care center, or emergency room.  If you have a medical emergency, please immediately call 911 or go to the emergency department.  Pager Numbers  - Dr. Gwen Pounds: [phone]  - Dr. Roseanne Reno: [phone]  - Dr. Katrinka Blazing: [phone]   In the event of inclement weather, please call our main line at [phone] for an update on the status of any delays or closures.  Dermatology Medication Tips: Please keep the boxes that topical medications come in in order to help keep  track of the instructions about where and how to use these. Pharmacies typically print the medication instructions only on the boxes and not directly on the medication tubes.   If your medication is too expensive, please contact our office at [phone] option 4 or send [place] a message through MyChart.   We are unable to tell what your co-pay for medications will be in advance as this is different depending on your insurance coverage. However, we may be able to find a substitute medication at lower cost or fill out paperwork to get insurance to cover a needed medication.   If a prior authorization is required to get your medication covered by your insurance company, please allow [place] 1-2 business days to complete this process.  Drug prices often vary depending on where the prescription is filled and some pharmacies may offer cheaper prices.  The website www.goodrx.com contains coupons for medications through  different pharmacies. The prices here do not account for what the cost may be with help from insurance (it may be cheaper with your insurance), but the website can give you the price if you did not use any insurance.  - You can print the associated coupon and take it with your prescription to the pharmacy.  - You may also stop by our office during regular business hours and pick up a GoodRx coupon card.  - If you need your prescription sent electronically to a different pharmacy, notify our office through [facility] or by phone at [phone] option 4.     Si Usted Necesita Algo Despus de Su Visita  Tambin puede enviarnos un mensaje a travs de Clinical cytogeneticist. Por lo general respondemos a los mensajes de MyChart en el transcurso de 1 a 2 das hbiles.  Para renovar recetas, por favor pida a su farmacia que se ponga en contacto con nuestra oficina. Annie Sable de fax es [place] [phone].  Si tiene un asunto urgente cuando la clnica est cerrada y que no puede esperar hasta el  siguiente da hbil, puede llamar/localizar a su doctor(a) al nmero que aparece a continuacin.   Por favor, tenga en cuenta que aunque hacemos todo lo posible para estar disponibles para asuntos urgentes fuera del horario de [place], no estamos disponibles las 24 horas del da, los 7 [address]  [address] de la [place].   Si tiene un problema urgente y no puede comunicarse con nosotros, puede optar por buscar atencin mdica  en el consultorio de su doctor(a), en una clnica privada, en un centro de atencin urgente o en una sala de emergencias.  Si tiene Engineer, drilling, por favor llame inmediatamente al 911 o vaya a la sala de emergencias.  Nmeros de bper  - Dr. Gwen Pounds: [phone]  - Dra. Roseanne Reno: [phone]  - Dr. Katrinka Blazing: [phone]   En caso de inclemencias del tiempo, por favor llame a Lacy Duverney principal al [phone] para una actualizacin sobre el [place] de cualquier retraso o cierre.  Consejos para la medicacin en dermatologa: Por favor, guarde las cajas en las que vienen los medicamentos de uso tpico para ayudarle a seguir las instrucciones sobre dnde y cmo usarlos. Las farmacias generalmente imprimen las instrucciones del medicamento slo en las cajas y no directamente en los tubos del [place].   Si su medicamento es muy caro, por favor, pngase en contacto con Rolm Gala llamando al [phone] y presione la opcin 4 o envenos un mensaje a travs de Clinical cytogeneticist.   No podemos decirle cul ser su copago por los medicamentos por adelantado ya que esto es diferente dependiendo de la cobertura de su seguro. Sin embargo, es posible que podamos encontrar un medicamento sustituto a Audiological scientist un formulario para que el seguro cubra el medicamento que se considera necesario.   Si se requiere una autorizacin previa para que su compaa de seguros [place] su medicamento, por favor permtanos de 1 a 2 das hbiles para completar [address]  Los precios de los  medicamentos varan con frecuencia dependiendo del Environmental consultant de dnde se surte la receta y alguna farmacias pueden ofrecer precios ms baratos.  El sitio web www.goodrx.com tiene cupones para medicamentos de Health and safety inspector. Los precios aqu no tienen en cuenta lo que podra costar con la ayuda del seguro (puede ser ms barato con su seguro), pero el sitio web puede darle el precio si no utiliz Tourist information centre manager.  - Puede imprimir el cupn correspondiente y llevarlo  con su receta a la farmacia.  - Tambin puede pasar por nuestra oficina durante el horario de atencin regular y Education officer, museum una tarjeta de cupones de GoodRx.  - Si necesita que su receta se enve electrnicamente a una farmacia diferente, informe a nuestra oficina a travs de MyChart de [place] o por telfono llamando al [phone] y presione la opcin 4.

## 2023-04-28 NOTE — Progress Notes (Addendum)
Follow-Up Visit   Subjective  Michelle Armstrong is a 62 y.o. female who presents for the following: Skin Cancer Screening and Full Body Skin Exam  The patient presents for Total-Body Skin Exam (TBSE) for skin cancer screening and mole check. The patient has spots, moles and lesions to be evaluated, some may be new or changing. 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 Area was changing and she wasn't able to get appointment at our office for a few months. No history of skin cancer.    The following portions of the chart were reviewed this encounter and updated as appropriate: medications, allergies, medical history  Review of Systems:  No other skin or systemic complaints except as noted in HPI or Assessment and Plan.  Objective  Well appearing patient in no apparent distress; mood and affect are within normal limits.  A full examination was performed including scalp, head, eyes, ears, nose, lips, neck, chest, axillae, abdomen, back, buttocks, bilateral upper extremities, bilateral lower extremities, hands, feet, fingers, toes, fingernails, and toenails. All findings within normal limits unless otherwise noted below.   Relevant physical exam findings are noted in the Assessment and Plan.  Left Posterior Shoulder 4 mm medium speckled brown papule at left posterior shoulder         Assessment & Plan   SKIN CANCER SCREENING PERFORMED TODAY.  ACTINIC DAMAGE - Chronic condition, secondary to cumulative UV/sun exposure - diffuse scaly erythematous macules with underlying dyspigmentation - Recommend daily broad spectrum sunscreen SPF 30+ to sun-exposed areas, reapply every 2 hours as needed.  - Staying in the shade or wearing long sleeves, sun glasses (UVA+UVB protection) and wide brim hats (4-inch brim around the entire circumference of the hat) are also recommended for sun protection.  - Call for new or changing  lesions.  LENTIGINES, SEBORRHEIC KERATOSES, HEMANGIOMAS - Benign normal skin lesions - Benign-appearing - Call for any changes LENTIGO Exam: 5mm speckled tan macule right forearm Due to sun exposure Treatment Plan: Benign-appearing, observe. Recommend daily broad spectrum sunscreen SPF 30+ to sun-exposed areas, reapply every 2 hours as needed.  Call for any changes  MELANOCYTIC NEVI - Tan-brown and/or pink-flesh-colored symmetric macules and papules - 4 mm flesh papule at right mid cheek - Benign appearing on exam today - Observation - Call clinic for new or changing moles - Recommend daily use of broad spectrum spf 30+ sunscreen to sun-exposed areas.   SCAR Exam: Pink smooth macule or patch right shoulder. Hx AK with ulcer treated at [facility].  Benign-appearing.  Clear without recurrence.  Observation.  Call clinic for new or changing lesions. Recommend daily broad spectrum sunscreen SPF 30+, reapply every 2 hours as needed.   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 Genetic in nature. No cure. - Observe. - If desired, patient can use an emollient (moisturizer) containing ammonium lactate (AmLactin), urea or salicylic acid once a day to smooth the area  Recommend starting moisturizer with exfoliant (Urea, Salicylic acid, or Lactic acid) one to two times daily to help smooth rough and bumpy skin.  OTC options include Cetaphil Rough and Bumpy lotion (Urea), Eucerin Roughness Relief lotion or spot treatment cream (Urea), CeraVe SA lotion/cream for Rough and Bumpy skin (Sal Acid), Gold Bond Rough and Bumpy cream (Sal Acid), and AmLactin 12% lotion/cream (Lactic Acid).  If applying in morning, also apply sunscreen to sun-exposed areas, since these exfoliating moisturizers  can increase sensitivity to sun.   SEBORRHEIC DERMATITIS Exam: Pink patches with greasy scale at ear canal  Chronic and persistent condition with  duration or expected duration over one year. Condition is symptomatic/ bothersome to patient. Not currently at goal.   Seborrheic Dermatitis is a chronic persistent rash characterized by pinkness and scaling most commonly of the mid face but also can occur on the scalp (dandruff), ears; mid chest, mid back and groin.  It tends to be exacerbated by stress and cooler weather.  People who have neurologic disease may experience new onset or exacerbation of existing seborrheic dermatitis.  The condition is not curable but treatable and can be controlled.  Treatment Plan: Start 1% hydrocortisone cream 1-2 times a day for flares.  Neoplasm of uncertain behavior of skin Left Posterior Shoulder  Anatomic Pathology Report  Skin / nail biopsy Type of biopsy: tangential   Informed consent: discussed and consent obtained   Patient was prepped and draped in usual sterile fashion: Area prepped with alcohol. Anesthesia: the lesion was anesthetized in a standard fashion   Anesthetic:  1% lidocaine w/ epinephrine 1-100,000 buffered w/ 8.4% NaHCO3 Instrument used: flexible razor blade   Hemostasis achieved with: pressure, aluminum chloride and electrodesiccation   Outcome: patient tolerated procedure well   Post-procedure details: wound care instructions given   Post-procedure details comment:  Ointment and small bandage applied Additional details:  Nevus r/o dysplasia. Sent to [facility].    Skin cancer screening  Actinic skin damage  Lentigo  Seborrheic keratosis  Nevus  Scar  History of actinic keratosis  Keratosis pilaris  Seborrheic dermatitis    Return in about 1 year (around 04/27/2024) for TBSE.  ICherlyn Labella, CMA, am acting as scribe for Willeen Niece, MD .   Documentation: I have reviewed the above documentation for accuracy and completeness, and I agree with the above.  Willeen Niece, MD

## 2023-04-30 LAB — ANATOMIC PATHOLOGY REPORT

## 2023-05-05 ENCOUNTER — Telehealth: Payer: Self-pay

## 2023-05-05 NOTE — Telephone Encounter (Signed)
Advised pt of bx results/sh ?

## 2023-05-05 NOTE — Telephone Encounter (Signed)
-----   Message from [place] sent at 05/05/2023 12:31 PM EDT ----- Diagnosis: Left Posterior Shoulder,Skin Biopsy: MELANOCYTIC  NEVUS, PREDOMINANTLY INTRADERMAL TYPE.   Please call: biopsy shows benign mole (nevus). No skin cancer. No further treatment needed. Continue annual skin checks

## 2023-05-16 ENCOUNTER — Other Ambulatory Visit: Payer: Self-pay | Admitting: Obstetrics and Gynecology

## 2023-05-16 DIAGNOSIS — R197 Diarrhea, unspecified: Secondary | ICD-10-CM

## 2023-05-16 DIAGNOSIS — Z0000 Encounter for general adult medical examination without abnormal findings: Secondary | ICD-10-CM

## 2023-05-16 DIAGNOSIS — Z130 Encounter for screening for diseases of the blood and blood-forming organs and certain disorders involving the immune mechanism: Secondary | ICD-10-CM

## 2023-05-16 DIAGNOSIS — Z13220 Encounter for screening for lipoid disorders: Secondary | ICD-10-CM

## 2023-05-16 DIAGNOSIS — Z131 Encounter for screening for diabetes mellitus: Secondary | ICD-10-CM

## 2023-05-16 DIAGNOSIS — Z1329 Encounter for screening for other suspected endocrine disorder: Secondary | ICD-10-CM

## 2023-05-16 DIAGNOSIS — Z1321 Encounter for screening for nutritional disorder: Secondary | ICD-10-CM

## 2023-05-16 DIAGNOSIS — K5229 Other allergic and dietetic gastroenteritis and colitis: Secondary | ICD-10-CM

## 2023-05-16 NOTE — Progress Notes (Signed)
Lab orders per pt request. S/p bariatric surgery with wt gain recently, no changes. [facility] employee benefits till next month.

## 2023-05-21 DIAGNOSIS — M9901 Segmental and somatic dysfunction of cervical region: Secondary | ICD-10-CM | POA: Diagnosis not present

## 2023-05-21 DIAGNOSIS — M9902 Segmental and somatic dysfunction of thoracic region: Secondary | ICD-10-CM | POA: Diagnosis not present

## 2023-05-21 DIAGNOSIS — M62830 Muscle spasm of back: Secondary | ICD-10-CM | POA: Diagnosis not present

## 2023-05-21 DIAGNOSIS — R519 Headache, unspecified: Secondary | ICD-10-CM | POA: Diagnosis not present

## 2023-05-27 ENCOUNTER — Telehealth: Payer: Self-pay

## 2023-05-27 ENCOUNTER — Other Ambulatory Visit: Payer: Self-pay | Admitting: Obstetrics and Gynecology

## 2023-05-27 NOTE — Telephone Encounter (Signed)
Pt aware. Lab orders in system.

## 2023-05-27 NOTE — Telephone Encounter (Signed)
Patient inquiring if requested lab orders are ready. Advised we are still working with IT. They advised test [phone] (Gluten) that she requested is not able to be ordered by itself. We are following up to find out if there is a comparable test. We will follow up when we receive this information. Patient scheduled for lab appointment 06/03/23 at 8:20 per her request.

## 2023-06-03 ENCOUNTER — Encounter: Payer: BC Managed Care – PPO | Admitting: Dermatology

## 2023-06-03 ENCOUNTER — Other Ambulatory Visit: Payer: BC Managed Care – PPO

## 2023-06-03 DIAGNOSIS — Z131 Encounter for screening for diabetes mellitus: Secondary | ICD-10-CM | POA: Diagnosis not present

## 2023-06-03 DIAGNOSIS — Z130 Encounter for screening for diseases of the blood and blood-forming organs and certain disorders involving the immune mechanism: Secondary | ICD-10-CM

## 2023-06-03 DIAGNOSIS — R197 Diarrhea, unspecified: Secondary | ICD-10-CM

## 2023-06-03 DIAGNOSIS — Z13220 Encounter for screening for lipoid disorders: Secondary | ICD-10-CM

## 2023-06-03 DIAGNOSIS — Z0000 Encounter for general adult medical examination without abnormal findings: Secondary | ICD-10-CM

## 2023-06-03 DIAGNOSIS — K5229 Other allergic and dietetic gastroenteritis and colitis: Secondary | ICD-10-CM

## 2023-06-03 DIAGNOSIS — Z1321 Encounter for screening for nutritional disorder: Secondary | ICD-10-CM

## 2023-06-03 DIAGNOSIS — Z1329 Encounter for screening for other suspected endocrine disorder: Secondary | ICD-10-CM

## 2023-06-05 LAB — ALLERGENS (94) FOODS
Allergen Apple, IgE: <0.1 kU/L
Allergen Banana IgE: <0.1 kU/L
Allergen Barley IgE: <0.1 kU/L
Allergen Black Pepper IgE: <0.1 kU/L
Allergen Blueberry IgE: <0.1 kU/L
Allergen Broccoli: <0.1 kU/L
Allergen Cabbage IgE: <0.1 kU/L
Allergen Carrot IgE: <0.1 kU/L
Allergen Cauliflower IgE: <0.1 kU/L
Allergen Celery IgE: <0.1 kU/L
Allergen Cinnamon IgE: <0.1 kU/L
Allergen Coconut IgE: <0.1 kU/L
Allergen Corn, IgE: <0.1 kU/L
Allergen Cucumber IgE: <0.1 kU/L
Allergen Garlic IgE: <0.1 kU/L
Allergen Ginger IgE: <0.1 kU/L
Allergen Gluten IgE: <0.1 kU/L
Allergen Grape IgE: <0.1 kU/L
Allergen Grapefruit IgE: <0.1 kU/L
Allergen Green Bean IgE: <0.1 kU/L
Allergen Green Pea IgE: <0.1 kU/L
Allergen Lamb IgE: <0.1 kU/L
Allergen Lettuce IgE: <0.1 kU/L
Allergen Lime IgE: <0.1 kU/L
Allergen Melon IgE: <0.1 kU/L
Allergen Oat IgE: <0.1 kU/L
Allergen Onion IgE: <0.1 kU/L
Allergen Pear IgE: <0.1 kU/L
Allergen Potato, White IgE: <0.1 kU/L
Allergen Rice IgE: <0.1 kU/L
Allergen Salmon IgE: <0.1 kU/L
Allergen Strawberry IgE: <0.1 kU/L
Allergen Sweet Potato IgE: <0.1 kU/L
Allergen Tomato, IgE: <0.1 kU/L
Allergen Turkey IgE: <0.1 kU/L
Allergen Watermelon IgE: <0.1 kU/L
Allergen, Peach f95: <0.1 kU/L
Basil: <0.1 kU/L
Beef IgE: <0.1 kU/L
C074-IgE Gelatin: <0.1 kU/L
Chicken IgE: <0.1 kU/L
Chocolate/Cacao IgE: <0.1 kU/L
Clam IgE: <0.1 kU/L
Codfish IgE: <0.1 kU/L
Coffee: <0.1 kU/L
Cranberry IgE: <0.1 kU/L
Egg White IgE: <0.1 kU/L
F020-IgE Almond: <0.1 kU/L
F023-IgE Crab: <0.1 kU/L
F045-IgE Yeast: <0.1 kU/L
F076-IgE Alpha Lactalbumin: <0.1 kU/L
F077-IgE Beta Lactoglobulin: <0.1 kU/L
F078-IgE Casein: <0.1 kU/L
F080-IgE Lobster: <0.1 kU/L
F081-IgE Cheese, Cheddar Type: <0.1 kU/L
F089-IgE Mustard: <0.1 kU/L
F096-IgE Avocado: <0.1 kU/L
F202-IgE Cashew Nut: <0.1 kU/L
F214-IgE Spinach: <0.1 kU/L
F222-IgE Tea: <0.1 kU/L
F242-IgE Bing Cherry: <0.1 kU/L
F261-IgE Asparagus: <0.1 kU/L
F262-IgE Eggplant: <0.1 kU/L
F265-IgE Cumin: <0.1 kU/L
F278-IgE Bayleaf (Laurel): <0.1 kU/L
F283-IgE Oregano: <0.1 kU/L
F300-IgE Goat's Milk: <0.1 kU/L
F342-IgE Olive, Black: <0.1 kU/L
F343-IgE Raspberry: <0.1 kU/L
IgE Egg (Yolk): <0.1 kU/L
Kidney Bean IgE: <0.1 kU/L
Lemon: <0.1 kU/L
Lima Bean IgE: <0.1 kU/L
Malt: <0.1 kU/L
Mushroom IgE: <0.1 kU/L
Orange: <0.1 kU/L
Paprika IgE: <0.1 kU/L
Peanut IgE: <0.1 kU/L
Pineapple IgE: <0.1 kU/L
Pork IgE: <0.1 kU/L
Pumpkin IgE: <0.1 kU/L
Red Beet: <0.1 kU/L
Rye IgE: <0.1 kU/L
Scallop IgE: <0.1 kU/L
Sesame Seed IgE: <0.1 kU/L
Shrimp IgE: <0.1 kU/L
Soybean IgE: <0.1 kU/L
Tuna: <0.1 kU/L
Vanilla: <0.1 kU/L
Walnut IgE: <0.1 kU/L
Wheat IgE: <0.1 kU/L
Whey: <0.1 kU/L
White Bean IgE: <0.1 kU/L

## 2023-06-05 LAB — ALLERGENS W/TOTAL IGE AREA 2
Alternaria Alternata IgE: <0.1 kU/L
Aspergillus Fumigatus IgE: <0.1 kU/L
Bermuda Grass IgE: <0.1 kU/L
Cat Dander IgE: <0.1 kU/L
Cedar, Mountain IgE: <0.1 kU/L
Cladosporium Herbarum IgE: <0.1 kU/L
Cockroach, German IgE: <0.1 kU/L
Common Silver Birch IgE: <0.1 kU/L
Cottonwood IgE: <0.1 kU/L
D Farinae IgE: 0.21 kU/L — AB
D Pteronyssinus IgE: 0.18 kU/L — AB
Dog Dander IgE: <0.1 kU/L
Elm, American IgE: <0.1 kU/L
Johnson Grass IgE: <0.1 kU/L
Maple/Box Elder IgE: <0.1 kU/L
Mouse Urine IgE: <0.1 kU/L
Oak, White IgE: <0.1 kU/L
Pecan, Hickory IgE: <0.1 kU/L
Penicillium Chrysogen IgE: <0.1 kU/L
Pigweed, Rough IgE: <0.1 kU/L
Ragweed, Short IgE: <0.1 kU/L
Sheep Sorrel IgE Qn: <0.1 kU/L
Timothy Grass IgE: 1.02 kU/L — AB
White Mulberry IgE: <0.1 kU/L

## 2023-06-05 LAB — CBC WITH DIFFERENTIAL/PLATELET
Basophils Absolute: 0.1 10*3/uL (ref 0.0–0.2)
Basos: 1 %
EOS (ABSOLUTE): 0.1 10*3/uL (ref 0.0–0.4)
Eos: 2 %
Hematocrit: 44.6 % (ref 34.0–46.6)
Hemoglobin: 14.6 g/dL (ref 11.1–15.9)
Immature Grans (Abs): 0 10*3/uL (ref 0.0–0.1)
Immature Granulocytes: 0 %
Lymphocytes Absolute: 1.6 10*3/uL (ref 0.7–3.1)
Lymphs: 33 %
MCH: 28.9 pg (ref 26.6–33.0)
MCHC: 32.7 g/dL (ref 31.5–35.7)
MCV: 88 fL (ref 79–97)
Monocytes Absolute: 0.3 10*3/uL (ref 0.1–0.9)
Monocytes: 5 %
Neutrophils Absolute: 2.9 10*3/uL (ref 1.4–7.0)
Neutrophils: 59 %
Platelets: 293 10*3/uL (ref 150–450)
RBC: 5.05 x10E6/uL (ref 3.77–5.28)
RDW: 13.3 % (ref 11.7–15.4)
WBC: 5 10*3/uL (ref 3.4–10.8)

## 2023-06-05 LAB — LIPID PANEL
Chol/HDL Ratio: 2.4 ratio (ref 0.0–4.4)
Cholesterol, Total: 252 mg/dL — ABNORMAL HIGH (ref 100–199)
HDL: 107 mg/dL (ref >39)
LDL Chol Calc (NIH): 131 mg/dL — ABNORMAL HIGH (ref 0–99)
Triglycerides: 87 mg/dL (ref 0–149)
VLDL Cholesterol Cal: 14 mg/dL (ref 5–40)

## 2023-06-05 LAB — C-REACTIVE PROTEIN: CRP: <1 mg/L (ref 0–10)

## 2023-06-05 LAB — COMPREHENSIVE METABOLIC PANEL
ALT: 28 IU/L (ref 0–32)
AST: 28 IU/L (ref 0–40)
Albumin: 4.7 g/dL (ref 3.8–4.9)
Alkaline Phosphatase: 97 IU/L (ref 44–121)
BUN/Creatinine Ratio: 29 — ABNORMAL HIGH (ref 12–28)
BUN: 21 mg/dL (ref 8–27)
Bilirubin Total: 0.8 mg/dL (ref 0.0–1.2)
CO2: 21 mmol/L (ref 20–29)
Calcium: 9.8 mg/dL (ref 8.7–10.3)
Chloride: 103 mmol/L (ref 96–106)
Creatinine, Ser: 0.72 mg/dL (ref 0.57–1.00)
Globulin, Total: 2.4 g/dL (ref 1.5–4.5)
Glucose: 84 mg/dL (ref 70–99)
Potassium: 4.5 mmol/L (ref 3.5–5.2)
Sodium: 141 mmol/L (ref 134–144)
Total Protein: 7.1 g/dL (ref 6.0–8.5)
eGFR: 96 mL/min/{1.73_m2} (ref >59)

## 2023-06-05 LAB — IRON,TIBC AND FERRITIN PANEL
Ferritin: 34 ng/mL (ref 15–150)
Iron Saturation: 23 % (ref 15–55)
Iron: 86 ug/dL (ref 27–159)
Total Iron Binding Capacity: 382 ug/dL (ref 250–450)
UIBC: 296 ug/dL (ref 131–425)

## 2023-06-05 LAB — ALLERGENS(7)
Brazil Nut IgE: <0.1 kU/L
Hazelnut (Filbert) IgE: <0.1 kU/L
Pecan Nut IgE: <0.1 kU/L

## 2023-06-05 LAB — MAGNESIUM: Magnesium: 2.2 mg/dL (ref 1.6–2.3)

## 2023-06-05 LAB — ALPHA-GAL PANEL
IgE (Immunoglobulin E), Serum: 41 IU/mL (ref 6–495)
O215-IgE Alpha-Gal: <0.1 kU/L

## 2023-06-05 LAB — VITAMIN D 25 HYDROXY (VIT D DEFICIENCY, FRACTURES): Vit D, 25-Hydroxy: 59.7 ng/mL (ref 30.0–100.0)

## 2023-06-05 LAB — THYROID PANEL WITH TSH
Free Thyroxine Index: 2 (ref 1.2–4.9)
T3 Uptake Ratio: 27 % (ref 24–39)
T4, Total: 7.3 ug/dL (ref 4.5–12.0)
TSH: 2.52 u[IU]/mL (ref 0.450–4.500)

## 2023-06-05 LAB — B12 AND FOLATE PANEL
Folate: >20 ng/mL (ref >3.0)
Vitamin B-12: 586 pg/mL (ref 232–1245)

## 2023-06-05 LAB — LIPOPROTEIN A (LPA): Lipoprotein (a): 252.3 nmol/L — ABNORMAL HIGH (ref <75.0)

## 2023-06-05 LAB — AMYLASE: Amylase: 74 U/L (ref 31–110)

## 2023-06-06 ENCOUNTER — Telehealth: Payer: Self-pay | Admitting: Obstetrics and Gynecology

## 2023-06-06 NOTE — Telephone Encounter (Signed)
Pt called in about some test results that she has received.  She is worried about the allergy results.  In December of 2023, the allergy results were negative.  She ones that she just did are now positive.  She is also not convinced that the cholesterol and LPA levels are correct.  She would like for you to reach out to her about these test results.

## 2023-06-06 NOTE — Telephone Encounter (Signed)
Pt states blood work cannot be correct, she says label must have come off her blood tube and blood tested is not hers. She is asking for retesting at no charge. Very concerned about the LPA test in 200's, her mom just had it done and hers was in 30s.

## 2023-06-09 ENCOUNTER — Telehealth: Payer: Self-pay | Admitting: Obstetrics and Gynecology

## 2023-06-09 DIAGNOSIS — M810 Age-related osteoporosis without current pathological fracture: Secondary | ICD-10-CM

## 2023-06-09 DIAGNOSIS — R1013 Epigastric pain: Secondary | ICD-10-CM

## 2023-06-09 DIAGNOSIS — R197 Diarrhea, unspecified: Secondary | ICD-10-CM

## 2023-06-09 NOTE — Telephone Encounter (Signed)
Patient called wanted Michelle Armstrong to call her.  She doesn't have access to [employer].  Patient also would like a referral to GI For an EGD and Colonoscopy

## 2023-06-09 NOTE — Telephone Encounter (Signed)
Spoke with pt re: her concerns.  Still having GI sx of constipation and diarrhea, with rectal mucous on stools. Sx started after trip to [place]. No stool testing done.  Has had occas epigastric cramping/painful squeezing sensation since 4/24, but sx getting worse now and lasting longer (initially were more fleeting). Almost went to ED due to pain a few days ago. Has epigastric pain with eating and even after drinking a few sips of water only. Feels hard area in epigastric region and wonders if hernia. Hx of hernia in past. Pt is s/p bariatric surgery years ago. Would like referral to GI for further eval. Will do stool cultures in meantime while pt waiting to get appt.  Discussed hernia would need to go to gen surg.   DEXA order placed to do at end of yr.  Discussed elevated lipoprotein a and pt surprised since her mom's is so low. No FH CVD/MI/high cholesterol. Question related to inflammatory process from current GI sx? Also, no treatment or recommendations at this time. Her LDL is borderline at 131.

## 2023-06-09 NOTE — Telephone Encounter (Signed)
See different note

## 2023-06-10 ENCOUNTER — Other Ambulatory Visit: Payer: BC Managed Care – PPO

## 2023-06-10 NOTE — Telephone Encounter (Signed)
Pt called office and asked for a return call. She wanted to know if [facility] had sent referral to gastroenterology. Advised yes. She also wants to know what should she do about allergy tests done resulting negative. States she had these tests drawn about 11-12 years ago and she had a lot of tests come back positive and now it is saying she has zero allergies? She wants to know if she should call [facility] and complain and maybe get them redrawn? I advised her if that's what she wanted to do she could and if we needed to put orders in she can give [place] a call back and talk to [facility] about putting orders in again.

## 2023-06-11 DIAGNOSIS — R197 Diarrhea, unspecified: Secondary | ICD-10-CM | POA: Diagnosis not present

## 2023-06-11 DIAGNOSIS — R1013 Epigastric pain: Secondary | ICD-10-CM | POA: Diagnosis not present

## 2023-06-12 ENCOUNTER — Telehealth: Payer: Self-pay | Admitting: Obstetrics and Gynecology

## 2023-06-12 DIAGNOSIS — K5229 Other allergic and dietetic gastroenteritis and colitis: Secondary | ICD-10-CM

## 2023-06-12 NOTE — Telephone Encounter (Signed)
The patient is calling to follow up on to speak with you about orders she needs. Please advise?

## 2023-06-12 NOTE — Telephone Encounter (Signed)
The only allergies she tested positive for were a few environmental ones. Why does she need them redrawn? Her food and nuts were negative. Psl clarify. I will deal with it Monday because I don't know what the codes are again.

## 2023-06-14 LAB — OVA AND PARASITE EXAMINATION

## 2023-06-15 LAB — STOOL CULTURE: E coli, Shiga toxin Assay: NEGATIVE

## 2023-06-15 NOTE — Progress Notes (Signed)
Pls let pt know stool cultures negative for eggs, cysts, and parasites, as well as salmonella, shigella and campylobacter.

## 2023-06-15 NOTE — Telephone Encounter (Signed)
Lab orders placed. Pls let pt know ASAP so she can get redrawn while she still has [facility] insurance.

## 2023-06-15 NOTE — Addendum Note (Signed)
Addended by: Rica Records on: 06/15/2023 08:43 PM   Modules accepted: Orders

## 2023-06-16 ENCOUNTER — Other Ambulatory Visit: Payer: BC Managed Care – PPO

## 2023-06-16 ENCOUNTER — Telehealth: Payer: Self-pay

## 2023-06-16 DIAGNOSIS — K5229 Other allergic and dietetic gastroenteritis and colitis: Secondary | ICD-10-CM

## 2023-06-16 NOTE — Telephone Encounter (Signed)
Pt aware.

## 2023-06-16 NOTE — Telephone Encounter (Signed)
Pt said she had mentioned to you about so much pain she was having that she thinks its a hernia. You had referred her out but appt isnt until January and she doesn't know if she can wait that long since pain can get unbearable. Would you be willing to order a scan so she doesn't have to wait so long?

## 2023-06-17 DIAGNOSIS — M62830 Muscle spasm of back: Secondary | ICD-10-CM | POA: Diagnosis not present

## 2023-06-17 DIAGNOSIS — M9902 Segmental and somatic dysfunction of thoracic region: Secondary | ICD-10-CM | POA: Diagnosis not present

## 2023-06-17 DIAGNOSIS — M9901 Segmental and somatic dysfunction of cervical region: Secondary | ICD-10-CM | POA: Diagnosis not present

## 2023-06-17 DIAGNOSIS — R519 Headache, unspecified: Secondary | ICD-10-CM | POA: Diagnosis not present

## 2023-06-17 NOTE — Telephone Encounter (Signed)
Spoke with pt. Recommended ref to GI given sx. Having sharp, squeezing pain epigastric area. S/p bariatric surg and 2 ventral hernia repairs with sounds like LOA in past. Sx random, can last 1-15 min. Very severe. Also pain along bilat hips and RT kidney area; sometimes occurs with epigastric pain, sometimes on its own. No aggrav factors. Pt with stool changes with neg stool cultures.  Pt will call [facility] to see if they will see her vs refer her back to Dr. Arlyce Dice in [place] (did her prior surg).

## 2023-06-19 LAB — ALLERGENS (94) FOODS
Allergen Apple, IgE: <0.1 kU/L
Allergen Banana IgE: <0.1 kU/L
Allergen Barley IgE: <0.1 kU/L
Allergen Black Pepper IgE: <0.1 kU/L
Allergen Blueberry IgE: <0.1 kU/L
Allergen Broccoli: <0.1 kU/L
Allergen Cabbage IgE: <0.1 kU/L
Allergen Carrot IgE: <0.1 kU/L
Allergen Cauliflower IgE: <0.1 kU/L
Allergen Celery IgE: <0.1 kU/L
Allergen Cinnamon IgE: <0.1 kU/L
Allergen Coconut IgE: <0.1 kU/L
Allergen Corn, IgE: <0.1 kU/L
Allergen Cucumber IgE: <0.1 kU/L
Allergen Garlic IgE: <0.1 kU/L
Allergen Ginger IgE: <0.1 kU/L
Allergen Gluten IgE: <0.1 kU/L
Allergen Grape IgE: <0.1 kU/L
Allergen Grapefruit IgE: <0.1 kU/L
Allergen Green Bean IgE: <0.1 kU/L
Allergen Green Pea IgE: <0.1 kU/L
Allergen Lamb IgE: <0.1 kU/L
Allergen Lettuce IgE: <0.1 kU/L
Allergen Lime IgE: <0.1 kU/L
Allergen Melon IgE: <0.1 kU/L
Allergen Oat IgE: <0.1 kU/L
Allergen Onion IgE: <0.1 kU/L
Allergen Pear IgE: <0.1 kU/L
Allergen Potato, White IgE: <0.1 kU/L
Allergen Rice IgE: <0.1 kU/L
Allergen Salmon IgE: <0.1 kU/L
Allergen Strawberry IgE: <0.1 kU/L
Allergen Sweet Potato IgE: <0.1 kU/L
Allergen Tomato, IgE: <0.1 kU/L
Allergen Turkey IgE: <0.1 kU/L
Allergen Watermelon IgE: <0.1 kU/L
Allergen, Peach f95: <0.1 kU/L
Basil: <0.1 kU/L
Beef IgE: <0.1 kU/L
C074-IgE Gelatin: <0.1 kU/L
Chicken IgE: <0.1 kU/L
Chocolate/Cacao IgE: <0.1 kU/L
Clam IgE: <0.1 kU/L
Codfish IgE: <0.1 kU/L
Coffee: <0.1 kU/L
Cranberry IgE: <0.1 kU/L
Egg White IgE: <0.1 kU/L
F020-IgE Almond: <0.1 kU/L
F023-IgE Crab: <0.1 kU/L
F045-IgE Yeast: <0.1 kU/L
F076-IgE Alpha Lactalbumin: <0.1 kU/L
F077-IgE Beta Lactoglobulin: <0.1 kU/L
F078-IgE Casein: <0.1 kU/L
F080-IgE Lobster: <0.1 kU/L
F081-IgE Cheese, Cheddar Type: <0.1 kU/L
F089-IgE Mustard: <0.1 kU/L
F096-IgE Avocado: <0.1 kU/L
F202-IgE Cashew Nut: <0.1 kU/L
F214-IgE Spinach: <0.1 kU/L
F222-IgE Tea: <0.1 kU/L
F242-IgE Bing Cherry: <0.1 kU/L
F261-IgE Asparagus: <0.1 kU/L
F262-IgE Eggplant: <0.1 kU/L
F265-IgE Cumin: <0.1 kU/L
F278-IgE Bayleaf (Laurel): <0.1 kU/L
F283-IgE Oregano: <0.1 kU/L
F300-IgE Goat's Milk: <0.1 kU/L
F342-IgE Olive, Black: <0.1 kU/L
F343-IgE Raspberry: <0.1 kU/L
IgE Egg (Yolk): <0.1 kU/L
Kidney Bean IgE: <0.1 kU/L
Lemon: <0.1 kU/L
Lima Bean IgE: <0.1 kU/L
Malt: <0.1 kU/L
Mushroom IgE: <0.1 kU/L
Orange: <0.1 kU/L
Paprika IgE: <0.1 kU/L
Peanut IgE: <0.1 kU/L
Pineapple IgE: <0.1 kU/L
Pork IgE: <0.1 kU/L
Pumpkin IgE: <0.1 kU/L
Red Beet: <0.1 kU/L
Rye IgE: <0.1 kU/L
Scallop IgE: <0.1 kU/L
Sesame Seed IgE: <0.1 kU/L
Shrimp IgE: <0.1 kU/L
Soybean IgE: <0.1 kU/L
Tuna: <0.1 kU/L
Vanilla: <0.1 kU/L
Walnut IgE: <0.1 kU/L
Wheat IgE: <0.1 kU/L
Whey: <0.1 kU/L
White Bean IgE: <0.1 kU/L

## 2023-06-19 LAB — ALLERGENS W/TOTAL IGE AREA 2
Alternaria Alternata IgE: <0.1 kU/L
Aspergillus Fumigatus IgE: <0.1 kU/L
Bermuda Grass IgE: <0.1 kU/L
Cat Dander IgE: <0.1 kU/L
Cedar, Mountain IgE: <0.1 kU/L
Cladosporium Herbarum IgE: <0.1 kU/L
Cockroach, German IgE: <0.1 kU/L
Common Silver Birch IgE: <0.1 kU/L
Cottonwood IgE: <0.1 kU/L
D Farinae IgE: 0.19 kU/L — AB
D Pteronyssinus IgE: 0.17 kU/L — AB
Dog Dander IgE: <0.1 kU/L
Elm, American IgE: <0.1 kU/L
IgE (Immunoglobulin E), Serum: 38 [IU]/mL (ref 6–495)
Johnson Grass IgE: <0.1 kU/L
Maple/Box Elder IgE: <0.1 kU/L
Mouse Urine IgE: <0.1 kU/L
Oak, White IgE: <0.1 kU/L
Pecan, Hickory IgE: <0.1 kU/L
Penicillium Chrysogen IgE: <0.1 kU/L
Pigweed, Rough IgE: <0.1 kU/L
Ragweed, Short IgE: <0.1 kU/L
Sheep Sorrel IgE Qn: <0.1 kU/L
Timothy Grass IgE: 0.93 kU/L — AB
White Mulberry IgE: <0.1 kU/L

## 2023-06-19 NOTE — Telephone Encounter (Signed)
Pt aware. Will call back if she needs urgent referral. The doctor who initially did her surgery is no longer in the group per pt. She said she has googled and founded him and has reached out and is just waiting to hear back.

## 2023-06-19 NOTE — Progress Notes (Signed)
Pls call pt and let her know what I wrote. I don't think she check mychart. Thx.

## 2023-06-19 NOTE — Telephone Encounter (Signed)
I called pt to give her test results and she wants me to send Helmut Muster a msg asking if she could order an MRI of the area she is in so much pain. She can't get an appointment now and wants to see what shows on MRI now.

## 2023-06-19 NOTE — Telephone Encounter (Signed)
No, pt needs to be evaluated. I can do urgent referral to gen surg or she can go to ED. Exam findings determine imaging needed to be done.

## 2023-07-01 LAB — ALLERGY PROFILE, FOOD
Allergen Salmon IgE: <0.1 kU/L
Codfish IgE: <0.1 kU/L
F001-IgE Egg White: <0.1 kU/L
F002-IgE Milk: <0.1 kU/L
F004-IgE Wheat: <0.1 kU/L
F010-IgE Sesame Seed: <0.1 kU/L
F017-IgE Hazelnut (Filbert): <0.1 kU/L
F018-IgE Brazil Nut: <0.1 kU/L
F020-IgE Almond: <0.1 kU/L
F202-IgE Cashew Nut: <0.1 kU/L
F256-IgE Walnut: <0.1 kU/L
F416-IgE Tri a 19(w-5 gliadin): <0.1 kU/L
Peanut, IgE: <0.1 kU/L
Scallop IgE: <0.1 kU/L
Shrimp IgE: <0.1 kU/L
Soybean IgE: <0.1 kU/L
Tuna: <0.1 kU/L

## 2023-07-01 LAB — SPECIMEN STATUS REPORT

## 2023-07-15 ENCOUNTER — Telehealth: Payer: Self-pay | Admitting: Dermatology

## 2023-07-16 DIAGNOSIS — K449 Diaphragmatic hernia without obstruction or gangrene: Secondary | ICD-10-CM | POA: Diagnosis not present

## 2023-07-16 DIAGNOSIS — K219 Gastro-esophageal reflux disease without esophagitis: Secondary | ICD-10-CM | POA: Diagnosis not present

## 2023-07-16 DIAGNOSIS — Z9884 Bariatric surgery status: Secondary | ICD-10-CM | POA: Diagnosis not present

## 2023-07-16 DIAGNOSIS — R1013 Epigastric pain: Secondary | ICD-10-CM | POA: Diagnosis not present

## 2023-07-16 NOTE — Telephone Encounter (Signed)
Returned patient's call regarding charge on her bill from nevus shave removal of the left posterior shoulder. Advised patient that the biopsy was sent to [facility], who waves the pathology fee since she is employed there, but that does not cover the procedure code from our office. Any time there is a procedure done in our office, there is always an additional charge. Patient questions if anything can be done to lower the cost of her bill ($400+). Advised patient that Dr Roseanne Reno can possibly change the code from shave removal to tangential biopsy, but there is no guarantee that she will be charged any less or that it will not be more. Discussed the difference between biopsy and shave removal with patient. Patient verbalizes understanding and would like the code to be changed to tangential biopsy. She understands that, in the future, if any moles are removed, it will be coded as shave removal. Patient thanks Dr Roseanne Reno greatly for allowing this change.

## 2023-07-16 NOTE — Addendum Note (Signed)
Addended by: Willeen Niece on: 07/16/2023 02:43 PM   Modules accepted: Orders

## 2023-07-17 ENCOUNTER — Encounter: Payer: Self-pay | Admitting: Bariatrics

## 2023-07-17 ENCOUNTER — Other Ambulatory Visit: Payer: Self-pay | Admitting: Bariatrics

## 2023-07-17 DIAGNOSIS — R1013 Epigastric pain: Secondary | ICD-10-CM

## 2023-07-17 DIAGNOSIS — Z9884 Bariatric surgery status: Secondary | ICD-10-CM

## 2023-07-17 DIAGNOSIS — K219 Gastro-esophageal reflux disease without esophagitis: Secondary | ICD-10-CM

## 2023-07-17 DIAGNOSIS — K449 Diaphragmatic hernia without obstruction or gangrene: Secondary | ICD-10-CM

## 2023-07-17 NOTE — Telephone Encounter (Signed)
Error

## 2023-07-18 ENCOUNTER — Ambulatory Visit
Admission: RE | Admit: 2023-07-18 | Discharge: 2023-07-18 | Disposition: A | Discharge status: Home / Self Care | Payer: BC Managed Care – PPO | Source: Ambulatory Visit | Attending: Bariatrics | Admitting: Bariatrics

## 2023-07-18 DIAGNOSIS — D1803 Hemangioma of intra-abdominal structures: Secondary | ICD-10-CM | POA: Diagnosis not present

## 2023-07-18 DIAGNOSIS — R1013 Epigastric pain: Secondary | ICD-10-CM | POA: Diagnosis not present

## 2023-07-18 DIAGNOSIS — K449 Diaphragmatic hernia without obstruction or gangrene: Secondary | ICD-10-CM | POA: Insufficient documentation

## 2023-07-18 DIAGNOSIS — Z9884 Bariatric surgery status: Secondary | ICD-10-CM | POA: Insufficient documentation

## 2023-07-18 DIAGNOSIS — K219 Gastro-esophageal reflux disease without esophagitis: Secondary | ICD-10-CM | POA: Insufficient documentation

## 2023-07-18 MED ORDER — IOHEXOL 300 MG/ML  SOLN
100.0000 mL | Freq: Once | INTRAMUSCULAR | Status: AC | PRN
  Administered 2023-07-18: 100 mL via INTRAVENOUS

## 2023-07-21 ENCOUNTER — Ambulatory Visit
Admission: RE | Admit: 2023-07-21 | Discharge: 2023-07-21 | Disposition: A | Discharge status: Home / Self Care | Payer: BC Managed Care – PPO | Source: Ambulatory Visit | Attending: Bariatrics | Admitting: Bariatrics

## 2023-07-21 ENCOUNTER — Other Ambulatory Visit: Payer: Self-pay | Admitting: Bariatrics

## 2023-07-21 DIAGNOSIS — R1013 Epigastric pain: Secondary | ICD-10-CM

## 2023-07-21 DIAGNOSIS — K219 Gastro-esophageal reflux disease without esophagitis: Secondary | ICD-10-CM | POA: Diagnosis not present

## 2023-07-21 DIAGNOSIS — K449 Diaphragmatic hernia without obstruction or gangrene: Secondary | ICD-10-CM

## 2023-07-21 DIAGNOSIS — R109 Unspecified abdominal pain: Secondary | ICD-10-CM | POA: Diagnosis not present

## 2023-07-21 DIAGNOSIS — Z9884 Bariatric surgery status: Secondary | ICD-10-CM | POA: Diagnosis not present

## 2023-07-22 ENCOUNTER — Ambulatory Visit: Payer: BC Managed Care – PPO

## 2023-07-22 DIAGNOSIS — M9902 Segmental and somatic dysfunction of thoracic region: Secondary | ICD-10-CM | POA: Diagnosis not present

## 2023-07-22 DIAGNOSIS — M9901 Segmental and somatic dysfunction of cervical region: Secondary | ICD-10-CM | POA: Diagnosis not present

## 2023-07-22 DIAGNOSIS — M62830 Muscle spasm of back: Secondary | ICD-10-CM | POA: Diagnosis not present

## 2023-07-22 DIAGNOSIS — R519 Headache, unspecified: Secondary | ICD-10-CM | POA: Diagnosis not present

## 2023-07-23 ENCOUNTER — Telehealth: Payer: Self-pay | Admitting: Obstetrics and Gynecology

## 2023-07-23 ENCOUNTER — Other Ambulatory Visit: Payer: BC Managed Care – PPO

## 2023-07-23 DIAGNOSIS — Z1211 Encounter for screening for malignant neoplasm of colon: Secondary | ICD-10-CM

## 2023-07-23 DIAGNOSIS — R1013 Epigastric pain: Secondary | ICD-10-CM

## 2023-07-23 DIAGNOSIS — R197 Diarrhea, unspecified: Secondary | ICD-10-CM

## 2023-07-23 DIAGNOSIS — Z9884 Bariatric surgery status: Secondary | ICD-10-CM

## 2023-07-23 NOTE — Telephone Encounter (Signed)
Patient is asking for a return call, she has the requested information.  She was hoping that you would do a STAT a Colonoscopy. She has had a CAT Scan and Upper GI and getting scheduled for surgery the First week of December.  Thanks

## 2023-07-24 NOTE — Telephone Encounter (Signed)
Jennifer at [facility] called back stating pt worked in for [employer].

## 2023-07-24 NOTE — Telephone Encounter (Signed)
Spoke with pt. Had neg CT scan, UGI with barium last wk with bariatric MD, Dr. Maryclare Bean at [facility]. Pt has EGD scheduled 12/24 and needs colonscopy. Saw Dr. Marva Panda in past, has phone consult with nurse 10/16/23. Pt with episodic diarrhea, now with yellow stools. Needs colonoscopy sooner. Urgent GI ref placed after talking to [place] at [facility].

## 2023-07-25 DIAGNOSIS — K5900 Constipation, unspecified: Secondary | ICD-10-CM | POA: Diagnosis not present

## 2023-07-25 DIAGNOSIS — R1013 Epigastric pain: Secondary | ICD-10-CM | POA: Diagnosis not present

## 2023-07-25 DIAGNOSIS — Z9884 Bariatric surgery status: Secondary | ICD-10-CM | POA: Diagnosis not present

## 2023-08-06 ENCOUNTER — Ambulatory Visit: Payer: BC Managed Care – PPO

## 2023-08-06 DIAGNOSIS — K5900 Constipation, unspecified: Secondary | ICD-10-CM | POA: Diagnosis not present

## 2023-08-06 DIAGNOSIS — Z980 Intestinal bypass and anastomosis status: Secondary | ICD-10-CM | POA: Diagnosis not present

## 2023-08-06 DIAGNOSIS — K317 Polyp of stomach and duodenum: Secondary | ICD-10-CM | POA: Diagnosis not present

## 2023-08-06 DIAGNOSIS — D175 Benign lipomatous neoplasm of intra-abdominal organs: Secondary | ICD-10-CM | POA: Diagnosis not present

## 2023-08-06 DIAGNOSIS — R1013 Epigastric pain: Secondary | ICD-10-CM | POA: Diagnosis not present

## 2023-08-06 DIAGNOSIS — Z09 Encounter for follow-up examination after completed treatment for conditions other than malignant neoplasm: Secondary | ICD-10-CM | POA: Diagnosis not present

## 2023-08-06 DIAGNOSIS — Z9884 Bariatric surgery status: Secondary | ICD-10-CM | POA: Diagnosis not present

## 2023-08-06 DIAGNOSIS — Z860101 Personal history of adenomatous and serrated colon polyps: Secondary | ICD-10-CM | POA: Diagnosis not present

## 2023-08-06 DIAGNOSIS — K2950 Unspecified chronic gastritis without bleeding: Secondary | ICD-10-CM | POA: Diagnosis not present

## 2023-08-06 DIAGNOSIS — K640 First degree hemorrhoids: Secondary | ICD-10-CM | POA: Diagnosis not present

## 2023-08-19 DIAGNOSIS — M9901 Segmental and somatic dysfunction of cervical region: Secondary | ICD-10-CM | POA: Diagnosis not present

## 2023-08-19 DIAGNOSIS — R519 Headache, unspecified: Secondary | ICD-10-CM | POA: Diagnosis not present

## 2023-08-19 DIAGNOSIS — M62830 Muscle spasm of back: Secondary | ICD-10-CM | POA: Diagnosis not present

## 2023-08-19 DIAGNOSIS — M9902 Segmental and somatic dysfunction of thoracic region: Secondary | ICD-10-CM | POA: Diagnosis not present

## 2023-08-26 ENCOUNTER — Other Ambulatory Visit: Payer: BC Managed Care – PPO

## 2023-09-02 ENCOUNTER — Ambulatory Visit
Admission: RE | Admit: 2023-09-02 | Discharge: 2023-09-02 | Disposition: A | Discharge status: Home / Self Care | Payer: BC Managed Care – PPO | Source: Ambulatory Visit | Attending: Obstetrics and Gynecology | Admitting: Obstetrics and Gynecology

## 2023-09-02 DIAGNOSIS — M8589 Other specified disorders of bone density and structure, multiple sites: Secondary | ICD-10-CM | POA: Diagnosis not present

## 2023-09-02 DIAGNOSIS — M810 Age-related osteoporosis without current pathological fracture: Secondary | ICD-10-CM | POA: Diagnosis not present

## 2023-09-03 DIAGNOSIS — H2513 Age-related nuclear cataract, bilateral: Secondary | ICD-10-CM | POA: Diagnosis not present

## 2023-09-03 DIAGNOSIS — H0011 Chalazion right upper eyelid: Secondary | ICD-10-CM | POA: Diagnosis not present

## 2023-09-03 DIAGNOSIS — H5203 Hypermetropia, bilateral: Secondary | ICD-10-CM | POA: Diagnosis not present

## 2023-09-03 DIAGNOSIS — D3131 Benign neoplasm of right choroid: Secondary | ICD-10-CM | POA: Diagnosis not present

## 2023-09-03 DIAGNOSIS — H52203 Unspecified astigmatism, bilateral: Secondary | ICD-10-CM | POA: Diagnosis not present

## 2023-09-04 ENCOUNTER — Other Ambulatory Visit: Payer: Self-pay | Admitting: Obstetrics and Gynecology

## 2023-09-04 ENCOUNTER — Encounter: Payer: Self-pay | Admitting: Obstetrics and Gynecology

## 2023-09-04 DIAGNOSIS — Z1231 Encounter for screening mammogram for malignant neoplasm of breast: Secondary | ICD-10-CM

## 2023-09-05 ENCOUNTER — Other Ambulatory Visit: Payer: Self-pay | Admitting: Obstetrics and Gynecology

## 2023-09-05 DIAGNOSIS — Z13220 Encounter for screening for lipoid disorders: Secondary | ICD-10-CM

## 2023-09-05 DIAGNOSIS — Z131 Encounter for screening for diabetes mellitus: Secondary | ICD-10-CM

## 2023-09-05 DIAGNOSIS — Z0000 Encounter for general adult medical examination without abnormal findings: Secondary | ICD-10-CM

## 2023-09-05 NOTE — Progress Notes (Signed)
Lab orders for 2/25 labs due at annual.

## 2023-09-05 NOTE — Addendum Note (Signed)
Addended by: Althea Grimmer B on: 09/05/2023 12:53 PM   Modules accepted: Orders

## 2023-09-08 ENCOUNTER — Ambulatory Visit: Payer: BC Managed Care – PPO | Admitting: Dermatology

## 2023-09-08 DIAGNOSIS — L910 Hypertrophic scar: Secondary | ICD-10-CM | POA: Diagnosis not present

## 2023-09-08 DIAGNOSIS — H0014 Chalazion left upper eyelid: Secondary | ICD-10-CM

## 2023-09-08 DIAGNOSIS — L821 Other seborrheic keratosis: Secondary | ICD-10-CM

## 2023-09-08 DIAGNOSIS — L820 Inflamed seborrheic keratosis: Secondary | ICD-10-CM | POA: Diagnosis not present

## 2023-09-08 DIAGNOSIS — D1722 Benign lipomatous neoplasm of skin and subcutaneous tissue of left arm: Secondary | ICD-10-CM

## 2023-09-08 NOTE — Progress Notes (Signed)
Follow-Up Visit   Subjective  Michelle Armstrong is a 62 y.o. female who presents for the following: Patient here today concerning a lump she noticed at left upper arm and scaly crusting patches that come and go at right shoulder. Patient also reports a achy sore area at right shoulder where she had previous biopsy.     The patient has spots, moles and lesions to be evaluated, some may be new or changing and the patient may have concern these could be cancer.   The following portions of the chart were reviewed this encounter and updated as appropriate: medications, allergies, medical history  Review of Systems:  No other skin or systemic complaints except as noted in HPI or Assessment and Plan.  Objective  Well appearing patient in no apparent distress; mood and affect are within normal limits.    A focused examination was performed of the following areas: Right shoulder and left arm   Relevant exam findings are noted in the Assessment and Plan.  Right Shoulder - Anterior x 2 (2) Erythematous stuck-on, waxy papule  Assessment & Plan   Slight induration of scar at right shoulder  HYPERTROPHIC SCAR Exam: slight induration of pink/white smooth scar at right shoulder  Benign-appearing.  Call clinic for new or changing lesions. Discussed option of intralesional triamcinolone if this becomes bothersome.   Treatment Plan: Observe.  Information for Serica gel given at appointment   Recommend Serica moisturizing scar formula cream every night or [facility] brand or Mederma silicone scar sheet every night for the first year after a scar appears to help with scar remodeling if desired. Scars remodel on their own for a full year and will gradually improve in appearance over time.   SEBORRHEIC KERATOSIS - Stuck-on, waxy, tan-brown papules and/or plaques  - Benign-appearing - Discussed benign etiology and prognosis. - Observe - Call for any changes   Lipoma  Exam: 0.6 cm  subcutaneous rubbery nodule  Location: left upper arm   Benign-appearing. Exam most consistent with a lipoma. Discussed that a lipoma is a benign fatty growth that can grow over time and sometimes get irritated. Recommend observation if it is not bothersome or changing. Discussed option of ILK injections or surgical excision to remove it if it is growing, symptomatic, or other changes noted. Please call for new or changing lesions so they can be evaluated.     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 Observe. Improving with treatment Patient seen by Optometrist, given topical treatment and doing warm compresses  Discussed if flared in future can send rx of PO doxycycline    INFLAMED SEBORRHEIC KERATOSIS (2) Right Shoulder - Anterior x 2 (2) Symptomatic, irritating, patient would like treated. Destruction of lesion - Right Shoulder - Anterior x 2 (2)  Destruction method: cryotherapy   Informed consent: discussed and consent obtained   Lesion destroyed using liquid nitrogen: Yes   Region frozen until ice ball extended beyond lesion: Yes   Outcome: patient tolerated procedure well with no complications   Post-procedure details: wound care instructions given   Additional details:  Prior to procedure, discussed risks of blister formation, small wound, skin dyspigmentation, or rare scar following cryotherapy. Recommend Vaseline ointment to treated areas while healing.   Return for keep follow up as scheduled in september .  I, Asher Muir, CMA, am acting as scribe for Willeen Niece, MD.   Documentation: I have  reviewed the above documentation for accuracy and completeness, and I agree with the above.  Willeen Niece, MD

## 2023-09-08 NOTE — Patient Instructions (Addendum)
For scar at right shoulder  Recommend Serica moisturizing scar formula cream every night or [facility] brand or Mederma silicone scar sheet every night for the first year after a scar appears to help with scar remodeling if desired. Scars remodel on their own for a full year and will gradually improve in appearance over time.    Seborrheic Keratosis  What causes seborrheic keratoses? Seborrheic keratoses are harmless, common skin growths that first appear during adult life.  As time goes by, more growths appear.  Some people may develop a large number of them.  Seborrheic keratoses appear on both covered and uncovered body parts.  They are not caused by sunlight.  The tendency to develop seborrheic keratoses can be inherited.  They vary in color from skin-colored to gray, brown, or even black.  They can be either smooth or have a rough, warty surface.   Seborrheic keratoses are superficial and look as if they were stuck on the skin.  Under the microscope this type of keratosis looks like layers upon layers of skin.  That is why at times the top layer may seem to fall off, but the rest of the growth remains and re-grows.    Treatment Seborrheic keratoses do not need to be treated, but can easily be removed in the office.  Seborrheic keratoses often cause symptoms when they rub on clothing or jewelry.  Lesions can be in the way of shaving.  If they become inflamed, they can cause itching, soreness, or burning.  Removal of a seborrheic keratosis can be accomplished by freezing, burning, or surgery. If any spot bleeds, scabs, or grows rapidly, please return to have it checked, as these can be an indication of a skin cancer.   Due to recent changes in healthcare laws, you may see results of your pathology and/or laboratory studies on MyChart before the doctors have had a chance to review them. We understand that in some cases there may be results that are confusing or concerning to you. Please understand  that not all results are received at the same time and often the doctors may need to interpret multiple results in order to provide you with the best plan of care or course of treatment. Therefore, we ask that you please give [place] 2 business days to thoroughly review all your results before contacting the office for clarification. Should we see a critical lab result, you will be contacted sooner.   If You Need Anything After Your Visit  If you have any questions or concerns for your doctor, please call our main line at [phone] and press option 4 to reach your doctor's medical assistant. If no one answers, please leave a voicemail as directed and we will return your call as soon as possible. Messages left after 4 pm will be answered the following business day.   You may also send [place] a message via MyChart. We typically respond to MyChart messages within 1-2 business days.  For prescription refills, please ask your pharmacy to contact our office. Our fax number is [phone].  If you have an urgent issue when the clinic is closed that cannot wait until the next business day, you can page your doctor at the number below.    Please note that while we do our best to be available for urgent issues outside of office hours, we are not available 24/7.   If you have an urgent issue and are unable to reach [place], you may choose to seek medical care  at your doctor's office, retail clinic, urgent care center, or emergency room.  If you have a medical emergency, please immediately call 911 or go to the emergency department.  Pager Numbers  - Dr. Gwen Pounds: [phone]  - Dr. Roseanne Reno: [phone]  - Dr. Katrinka Blazing: [phone]   In the event of inclement weather, please call our main line at [phone] for an update on the status of any delays or closures.  Dermatology Medication Tips: Please keep the boxes that topical medications come in in order to help keep track of the instructions about where  and how to use these. Pharmacies typically print the medication instructions only on the boxes and not directly on the medication tubes.   If your medication is too expensive, please contact our office at [phone] option 4 or send [place] a message through MyChart.   We are unable to tell what your co-pay for medications will be in advance as this is different depending on your insurance coverage. However, we may be able to find a substitute medication at lower cost or fill out paperwork to get insurance to cover a needed medication.   If a prior authorization is required to get your medication covered by your insurance company, please allow [place] 1-2 business days to complete this process.  Drug prices often vary depending on where the prescription is filled and some pharmacies may offer cheaper prices.  The website www.goodrx.com contains coupons for medications through different pharmacies. The prices here do not account for what the cost may be with help from insurance (it may be cheaper with your insurance), but the website can give you the price if you did not use any insurance.  - You can print the associated coupon and take it with your prescription to the pharmacy.  - You may also stop by our office during regular business hours and pick up a GoodRx coupon card.  - If you need your prescription sent electronically to a different pharmacy, notify our office through [facility] or by phone at [phone] option 4.     Si Usted Necesita Algo Despus de Su Visita  Tambin puede enviarnos un mensaje a travs de Clinical cytogeneticist. Por lo general respondemos a los mensajes de MyChart en el transcurso de 1 a 2 das hbiles.  Para renovar recetas, por favor pida a su farmacia que se ponga en contacto con nuestra oficina. Annie Sable de fax es [place] [phone].  Si tiene un asunto urgente cuando la clnica est cerrada y que no puede esperar hasta el siguiente da hbil, puede llamar/localizar a  su doctor(a) al nmero que aparece a continuacin.   Por favor, tenga en cuenta que aunque hacemos todo lo posible para estar disponibles para asuntos urgentes fuera del horario de [place], no estamos disponibles las 24 horas del da, los 7 [address]  [address] de la [place].   Si tiene un problema urgente y no puede comunicarse con nosotros, puede optar por buscar atencin mdica  en el consultorio de su doctor(a), en una clnica privada, en un centro de atencin urgente o en una sala de emergencias.  Si tiene Engineer, drilling, por favor llame inmediatamente al 911 o vaya a la sala de emergencias.  Nmeros de bper  - Dr. Gwen Pounds: [phone]  - Dra. Roseanne Reno: [phone]  - Dr. Katrinka Blazing: [phone]   En caso de inclemencias del tiempo, por favor llame a Lacy Duverney principal al [phone] para una actualizacin sobre el [place] de cualquier retraso o cierre.  Consejos para la  medicacin en dermatologa: Por favor, guarde las cajas en las que vienen los medicamentos de uso tpico para ayudarle a seguir las instrucciones sobre dnde y cmo usarlos. Las farmacias generalmente imprimen las instrucciones del medicamento slo en las cajas y no directamente en los tubos del [place].   Si su medicamento es muy caro, por favor, pngase en contacto con Rolm Gala llamando al [phone] y presione la opcin 4 o envenos un mensaje a travs de Clinical cytogeneticist.   No podemos decirle cul ser su copago por los medicamentos por adelantado ya que esto es diferente dependiendo de la cobertura de su seguro. Sin embargo, es posible que podamos encontrar un medicamento sustituto a Audiological scientist un formulario para que el seguro cubra el medicamento que se considera necesario.   Si se requiere una autorizacin previa para que su compaa de seguros [place] su medicamento, por favor permtanos de 1 a 2 das hbiles para completar [address]  Los precios de los medicamentos varan con frecuencia dependiendo  del Environmental consultant de dnde se surte la receta y alguna farmacias pueden ofrecer precios ms baratos.  El sitio web www.goodrx.com tiene cupones para medicamentos de Health and safety inspector. Los precios aqu no tienen en cuenta lo que podra costar con la ayuda del seguro (puede ser ms barato con su seguro), pero el sitio web puede darle el precio si no utiliz Tourist information centre manager.  - Puede imprimir el cupn correspondiente y llevarlo con su receta a la farmacia.  - Tambin puede pasar por nuestra oficina durante el horario de atencin regular y Education officer, museum una tarjeta de cupones de GoodRx.  - Si necesita que su receta se enve electrnicamente a una farmacia diferente, informe a nuestra oficina a travs de MyChart de [place] o por telfono llamando al [phone] y presione la opcin 4.

## 2023-09-15 DIAGNOSIS — M62830 Muscle spasm of back: Secondary | ICD-10-CM | POA: Diagnosis not present

## 2023-09-15 DIAGNOSIS — M9902 Segmental and somatic dysfunction of thoracic region: Secondary | ICD-10-CM | POA: Diagnosis not present

## 2023-09-15 DIAGNOSIS — R519 Headache, unspecified: Secondary | ICD-10-CM | POA: Diagnosis not present

## 2023-09-15 DIAGNOSIS — M9901 Segmental and somatic dysfunction of cervical region: Secondary | ICD-10-CM | POA: Diagnosis not present

## 2023-10-21 ENCOUNTER — Other Ambulatory Visit: Payer: BC Managed Care – PPO

## 2023-10-21 DIAGNOSIS — M9901 Segmental and somatic dysfunction of cervical region: Secondary | ICD-10-CM | POA: Diagnosis not present

## 2023-10-21 DIAGNOSIS — R519 Headache, unspecified: Secondary | ICD-10-CM | POA: Diagnosis not present

## 2023-10-21 DIAGNOSIS — M62830 Muscle spasm of back: Secondary | ICD-10-CM | POA: Diagnosis not present

## 2023-10-21 DIAGNOSIS — M9902 Segmental and somatic dysfunction of thoracic region: Secondary | ICD-10-CM | POA: Diagnosis not present

## 2023-10-24 ENCOUNTER — Other Ambulatory Visit: Payer: BC Managed Care – PPO

## 2023-10-24 DIAGNOSIS — H04123 Dry eye syndrome of bilateral lacrimal glands: Secondary | ICD-10-CM | POA: Diagnosis not present

## 2023-10-27 ENCOUNTER — Other Ambulatory Visit: Payer: BC Managed Care – PPO

## 2023-10-27 ENCOUNTER — Telehealth: Payer: Self-pay | Admitting: Obstetrics and Gynecology

## 2023-10-27 DIAGNOSIS — Z13220 Encounter for screening for lipoid disorders: Secondary | ICD-10-CM

## 2023-10-27 DIAGNOSIS — Z131 Encounter for screening for diabetes mellitus: Secondary | ICD-10-CM | POA: Diagnosis not present

## 2023-10-27 DIAGNOSIS — R8290 Unspecified abnormal findings in urine: Secondary | ICD-10-CM

## 2023-10-27 DIAGNOSIS — Z0000 Encounter for general adult medical examination without abnormal findings: Secondary | ICD-10-CM

## 2023-10-27 NOTE — Telephone Encounter (Signed)
 This pt came in for a lab appt on 10/27/2023.  She is scheduled to see you on 11/03/2023 for her annual.  She stated that her urine has a "funky" smell, but no burning or discharge.  She wanted to know if you would put in an order so that she can leave a urine sample before she has her appt next week.

## 2023-10-27 NOTE — Telephone Encounter (Signed)
 Pls schedule pt for nurse visit to process urine drop off. Thx.

## 2023-10-28 LAB — CBC WITH DIFFERENTIAL/PLATELET
Basophils Absolute: 0.1 10*3/uL (ref 0.0–0.2)
Basos: 1 %
EOS (ABSOLUTE): 0.2 10*3/uL (ref 0.0–0.4)
Eos: 3 %
Hematocrit: 44 % (ref 34.0–46.6)
Hemoglobin: 14.5 g/dL (ref 11.1–15.9)
Immature Grans (Abs): 0 10*3/uL (ref 0.0–0.1)
Immature Granulocytes: 0 %
Lymphocytes Absolute: 1.6 10*3/uL (ref 0.7–3.1)
Lymphs: 31 %
MCH: 29.2 pg (ref 26.6–33.0)
MCHC: 33 g/dL (ref 31.5–35.7)
MCV: 89 fL (ref 79–97)
Monocytes Absolute: 0.3 10*3/uL (ref 0.1–0.9)
Monocytes: 5 %
Neutrophils Absolute: 3 10*3/uL (ref 1.4–7.0)
Neutrophils: 60 %
Platelets: 333 10*3/uL (ref 150–450)
RBC: 4.96 x10E6/uL (ref 3.77–5.28)
RDW: 13.3 % (ref 11.7–15.4)
WBC: 5.1 10*3/uL (ref 3.4–10.8)

## 2023-10-28 LAB — COMPREHENSIVE METABOLIC PANEL
ALT: 26 [IU]/L (ref 0–32)
AST: 30 [IU]/L (ref 0–40)
Albumin: 4.5 g/dL (ref 3.9–4.9)
Alkaline Phosphatase: 96 [IU]/L (ref 44–121)
BUN/Creatinine Ratio: 19 (ref 12–28)
BUN: 14 mg/dL (ref 8–27)
Bilirubin Total: 0.8 mg/dL (ref 0.0–1.2)
CO2: 22 mmol/L (ref 20–29)
Calcium: 9.1 mg/dL (ref 8.7–10.3)
Chloride: 103 mmol/L (ref 96–106)
Creatinine, Ser: 0.73 mg/dL (ref 0.57–1.00)
Globulin, Total: 2.1 g/dL (ref 1.5–4.5)
Glucose: 86 mg/dL (ref 70–99)
Potassium: 4.4 mmol/L (ref 3.5–5.2)
Sodium: 142 mmol/L (ref 134–144)
Total Protein: 6.6 g/dL (ref 6.0–8.5)
eGFR: 94 mL/min/{1.73_m2} (ref >59)

## 2023-10-28 LAB — LIPID PANEL
Chol/HDL Ratio: 2.4 {ratio} (ref 0.0–4.4)
Cholesterol, Total: 252 mg/dL — ABNORMAL HIGH (ref 100–199)
HDL: 105 mg/dL (ref >39)
LDL Chol Calc (NIH): 127 mg/dL — ABNORMAL HIGH (ref 0–99)
Triglycerides: 119 mg/dL (ref 0–149)
VLDL Cholesterol Cal: 20 mg/dL (ref 5–40)

## 2023-10-28 LAB — HEMOGLOBIN A1C
Est. average glucose Bld gHb Est-mCnc: 123 mg/dL
Hgb A1c MFr Bld: 5.9 % — ABNORMAL HIGH (ref 4.8–5.6)

## 2023-10-28 NOTE — Progress Notes (Signed)
Psl call pt with lab results. Chol stable, HDL still high which is good. Pre-DM slightly increased. Cont low carb foods. Will see her 11/03/23 and can discuss further. Also ask if she has nurse appt for urine drop off for urine odor--see msg from yesterday. Thx.

## 2023-10-29 ENCOUNTER — Ambulatory Visit
Admission: RE | Admit: 2023-10-29 | Discharge: 2023-10-29 | Disposition: A | Discharge status: Home / Self Care | Payer: BC Managed Care – PPO | Source: Ambulatory Visit | Attending: Obstetrics and Gynecology | Admitting: Obstetrics and Gynecology

## 2023-10-29 DIAGNOSIS — Z1231 Encounter for screening mammogram for malignant neoplasm of breast: Secondary | ICD-10-CM | POA: Diagnosis not present

## 2023-10-30 ENCOUNTER — Telehealth: Payer: Self-pay | Admitting: Nurse Practitioner

## 2023-10-30 ENCOUNTER — Encounter: Payer: Self-pay | Admitting: Obstetrics and Gynecology

## 2023-10-30 ENCOUNTER — Other Ambulatory Visit: Payer: Self-pay | Admitting: Obstetrics and Gynecology

## 2023-10-30 DIAGNOSIS — R399 Unspecified symptoms and signs involving the genitourinary system: Secondary | ICD-10-CM

## 2023-10-30 NOTE — Telephone Encounter (Signed)
Copied from CRM [id number]. Topic: Medical Record Request - Other >> Oct 30, 2023  2:18 PM Michelle Armstrong wrote: Reason for CRM: Pt called in to see if the clinic received a fax request for her medical records, and she is requesting a call back to discuss because she doesn't want her complete medical history shared with the requestor. She wants to explain to the office what can be shared  Callback # [phone]

## 2023-10-30 NOTE — Progress Notes (Signed)
Chief Complaint  Patient presents with   Gynecologic Exam      HPI:      [name] is a 63 y.o. G2P0011 who LMP was No LMP recorded. Patient has had a hysterectomy., presents today for her annual examination. Her menses are absent due to total lap hyst BSO due to endometriosis 1999. She does not have PMB. No vasomotor sx.   Sex activity: single partner, contraception - status post hysterectomy. She does have vaginal dryness, slightly improved with ThermaVi procedure and lubricants with some relief. Has vaginal bleeding with sex occasionally. Did oral HRT in past and didn't like it, has declined vag ERT. Has gene for clotting disorder.   Last Pap: 12/03/22 Results were: no abnormalities/Neg HPV DNA 2023.  Pt likes yearly paps (gets reimbursed from [facility]) even though no longer indicated. Ext exam looked suspicious for LS in past but pt not doing tx. Does have some vulvar itching occas.  Hx of STDs: none   Last mammogram: 10/29/23  Results were: normal--routine follow-up in 12 months There is no FH of breast cancer. There is no FH of ovarian cancer. The patient does do self-breast exams.   Colonoscopy: colonoscopy 10/21 without abnormalities, with [facility]. Repeat due after 5 yrs.  Seeing GI due to severe epigastric pain with neg EGD; hx of hernias in past with LOA. Pain is less frequent now; was going to do dx lap but is losing insurance in 3 wks.   DEXA: 12/24 and 07/2021 at [facility]; osteopenia in spine and hip;  The probability of a major osteoporotic fracture is 17.2% within the next ten years. 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 Taking ca/Vit D.   Tobacco use: The patient denies current or previous tobacco use. Alcohol use: none No drug use Exercise: moderately active   She does get adequate calcium and Vitamin D in her diet. Recent labs 2/25. Total  chol and LDL borderline but HDL is 161. Pt taking MVI with iron. Now with pre-DM, HgA1C=5.9%.  Had UTI sx 2/25 with blood on UA and neg C&S. Due for repeat UA today; still has urine odor but no other sx.     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 02/2010 Echo: EF 55-60%, no rwma.   Vitamin D deficiency    low   Past Surgical History:  Procedure Laterality Date   ABDOMINAL HYSTERECTOMY  1999   Total lap hyst BSO due to endometriosis   CARDIAC ELECTROPHYSIOLOGY MAPPING AND ABLATION  2011   COLONOSCOPY WITH PROPOFOL N/A 07/10/2015   Procedure: COLONOSCOPY WITH PROPOFOL;  Surgeon: Christena Deem, MD;  Location: [facility] ENDOSCOPY;  Service: Endoscopy;  Laterality: N/A;   GALLBLADDER SURGERY     HERNIA REPAIR  12/2010   LEFT HEART CATH AND CORONARY ANGIOGRAPHY N/A 02/24/2017   Procedure: Left Heart Cath and Coronary Angiography;  Surgeon: Iran Ouch, MD;  Location: [facility] INVASIVE CV LAB;  Service: Cardiovascular;  Laterality: N/A;   OTHER SURGICAL HISTORY  2023   Shoulder Biopsy   TONSILLECTOMY     Family History  Problem Relation Age of Onset   Heart disease Father        s/p CABG   Obesity Father    Hyperlipidemia Father    Bladder Cancer Father    Drug abuse Father    Thyroid disease Sister    Clotting disorder Sister        gene mutation   Breast cancer Neg Hx     Social History          Socioeconomic History   Marital status: Married      Spouse name: Not on file   Number of children: 1   Years of education: Not on file   Highest education level: Not on file  Occupational History   Occupation: Biomedical scientist: [employer]  Social Needs   Financial resource strain: Not on file   Food insecurity      Worry: Not on file      Inability: Not on file   Transportation needs      Medical: Not on file      Non-medical: Not on file  Tobacco Use   Smoking status: Never Smoker   Smokeless tobacco: Never Used   Tobacco comment: tobacco use- no   Substance and Sexual Activity   Alcohol use: Yes      Alcohol/week: 0.0 standard drinks      Comment: rare glass of wine.   Drug use: No   Sexual activity: Yes      Birth control/protection: Post-menopausal  Lifestyle   Physical activity      Days per week: Not on file      Minutes per session: Not on file   Stress: Not on file  Relationships   Social connections      Talks on phone: Not on file      Gets together: Not on file      Attends religious service: Not on file      Active member of club or organization: Not on file      Attends meetings of clubs or organizations: Not on file      Relationship status: Not on file   Intimate partner violence      Fear of current or ex partner: Not on file      Emotionally abused: Not on file      Physically abused: Not on file      Forced sexual activity: Not on file  Other Topics Concern   Not on file  Social History Narrative    Lives locally with husband.  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      Current Outpatient Medications:    aspirin EC 81 MG tablet, Take 81 mg by mouth daily., Disp: , Rfl:    Calcium-Vitamin D-Vitamin K (VIACTIV CALCIUM PLUS D PO), , Disp: , Rfl:    cholecalciferol (VITAMIN D) 1000 units tablet, Take 1,000 Units by mouth daily., Disp: , Rfl:    clobetasol ointment (TEMOVATE) 0.05 %, Apply to affected  area every night  for 4 weeks, then every other day for 4 weeks and then 1-2 times weekly as maintenance, Disp: 30 g, Rfl: 0   Collagen Hydrolysate POWD, 2 Scoops as directed., Disp: , Rfl:    Multiple Vitamins-Minerals (MULTIVITAMIN PO), Take 1 tablet by mouth daily., Disp: , Rfl:    Probiotic Product (PROBIOTIC PO), Take by mouth., Disp: , Rfl:    Thiamine HCl (B-1 PO), Take 12.5 mg by mouth., Disp: , Rfl:    Wheat Dextrin (BENEFIBER PO), Take 2 Scoops by mouth., Disp: , Rfl:       ROS:  Review of Systems  Constitutional:  Negative for fatigue, fever and unexpected weight change.  Respiratory:  Negative for cough, shortness of breath and wheezing.   Cardiovascular:  Negative for chest pain, palpitations and leg swelling.  Gastrointestinal:  Negative for blood in stool, constipation, diarrhea, nausea and vomiting.  Endocrine: Negative for cold intolerance, heat intolerance and polyuria.  Genitourinary:  Positive for dyspareunia. Negative for dysuria, flank pain, frequency, genital sores, hematuria, menstrual problem, pelvic pain, urgency, vaginal bleeding, vaginal discharge and vaginal pain.  Musculoskeletal:  Negative for back pain, joint swelling and myalgias.  Skin:  Negative for rash.  Neurological:  Negative for dizziness, syncope, light-headedness, numbness and headaches.  Hematological:  Negative for adenopathy.  Psychiatric/Behavioral:  Negative for agitation, confusion, sleep disturbance and suicidal ideas. The patient is not nervous/anxious.         Objective: BP 100/76   Ht 5\' 7"  (1.702 m)   BMI 24.59 kg/m Wt 178 (per pt report)   Physical Exam Constitutional:      Appearance: She is well-developed.  Genitourinary:     Vulva normal.     Genitourinary Comments: UTERUS/CX SURG REM     Right Labia: No rash, tenderness or lesions.    Left Labia: No tenderness, lesions or rash.       Vaginal cuff intact.    No vaginal discharge, erythema or tenderness.     Moderate vaginal atrophy  present.     Right Adnexa: not tender and no mass present.    Left Adnexa: not tender and no mass present.    Cervix is absent.     Uterus is absent.  Breasts:    Right: No mass, nipple discharge, skin change or tenderness.     Left: No mass, nipple discharge, skin change or tenderness.  Neck:     Thyroid: No thyromegaly.  Cardiovascular:     Rate and Rhythm: Normal rate and regular rhythm.     Heart sounds: Normal heart sounds. No murmur heard. Pulmonary:     Effort: Pulmonary effort is normal.     Breath sounds: Normal breath sounds.  Abdominal:     Palpations: Abdomen is soft.     Tenderness: There is no abdominal tenderness. There is no guarding.  Musculoskeletal:        General: Normal range of motion.     Cervical back: Normal range of motion.  Neurological:     General: No focal deficit present.     Mental Status: She is alert and oriented to person, place, and time.     Cranial Nerves: No cranial nerve deficit.  Skin:    General: Skin is warm and dry.  Psychiatric:        Mood and Affect: Mood normal.        Behavior: Behavior normal.        Thought Content: Thought content normal.  Judgment: Judgment normal.  Vitals reviewed.    Results for orders placed or performed in visit on 11/03/23 (from the past 24 hours)  POCT Urinalysis Dipstick     Status: Abnormal   Collection Time: 11/03/23  4:29 PM  Result Value Ref Range   Color, UA yellow    Clarity, UA clear    Glucose, UA Negative Negative   Bilirubin, UA neg    Ketones, UA neg    Spec Grav, UA 1.015 1.010 - 1.025   Blood, UA small    pH, UA 6.0 5.0 - 8.0   Protein, UA Negative Negative   Urobilinogen, UA     Nitrite, UA neg    Leukocytes, UA Negative Negative   Appearance     Odor     NO VAG BLEEDING   Assessment/Plan: Encounter for annual routine gynecological examination  Cervical cancer screening - Plan: Cytology - PAP; pet pt request, aware not indicated since s/p hyst.   Encounter  for screening mammogram for malignant neoplasm of breast; pt current on mammo  Postmenopausal atrophic vaginitis--ok with lubricants.   Vaginal itching - Plan: clobetasol ointment (TEMOVATE) 0.05 %; looks like early LS. Rx clobetasol for tx; RTO in 3 wks for f/u (loses insurance in 3 wks).   Asymptomatic microscopic hematuria - Plan: Urinalysis, Routine w reflex microscopic; pos UA, send for LC UA. Will f/u with results.   Meds ordered this encounter  Medications   clobetasol ointment (TEMOVATE) 0.05 %    Sig: Apply to affected area every night for 4 weeks, then every other day for 4 weeks and then 1-2 times weekly as maintenance    Dispense:  30 g    Refill:  0    GYN counsel breast self exam, mammography screening, menopause, adequate intake of calcium and vitamin D, diet and exercise       F/U             Return in about 1 year (around 02/25/2020).   Aldean Pipe B. Danetta Prom, PA-C 03/01/2019 10:36 AM

## 2023-10-30 NOTE — Telephone Encounter (Signed)
Pls call pt to see if she is meaning our office. Thx.

## 2023-10-30 NOTE — Progress Notes (Signed)
Urine orders for [facility] for urine check  10/31/23

## 2023-10-31 ENCOUNTER — Other Ambulatory Visit (INDEPENDENT_AMBULATORY_CARE_PROVIDER_SITE_OTHER): Payer: BC Managed Care – PPO

## 2023-10-31 DIAGNOSIS — R399 Unspecified symptoms and signs involving the genitourinary system: Secondary | ICD-10-CM | POA: Diagnosis not present

## 2023-10-31 LAB — POCT URINALYSIS DIPSTICK
Bilirubin, UA: NEGATIVE
Glucose, UA: NEGATIVE
Ketones, UA: NEGATIVE
Leukocytes, UA: NEGATIVE
Nitrite, UA: NEGATIVE
Protein, UA: NEGATIVE
Spec Grav, UA: <=1.005 — AB (ref 1.010–1.025)
Urobilinogen, UA: 0.2 U/dL
pH, UA: 5 (ref 5.0–8.0)

## 2023-10-31 NOTE — Telephone Encounter (Signed)
Called patient with front office supervisor in office as witness and identifiers patient with 2 identifiers and ask how we could help her. Patient advised that an attorney involved in her case was subpoenaing her Medical record back to 2012. Patient advised could we hold these records until she can speak with her attorney . Advised patient that this office does not process records she would need to call medical records.  Proceeded to give patient number to medical records [phone].

## 2023-10-31 NOTE — Telephone Encounter (Signed)
Patient walked in office on 10/31/23 at 8:15am asking if office received a subpoena from opposing lawyers office. If so can she have a copy. She does not want any medical records released to them she states she has given them all paperwork and documents that is needed. She wanted to speak with office manager but she was not in at the time.  She would like a call from someone to discuss this further.

## 2023-11-03 ENCOUNTER — Encounter: Payer: Self-pay | Admitting: Obstetrics and Gynecology

## 2023-11-03 ENCOUNTER — Ambulatory Visit (INDEPENDENT_AMBULATORY_CARE_PROVIDER_SITE_OTHER): Payer: BC Managed Care – PPO | Admitting: Obstetrics and Gynecology

## 2023-11-03 ENCOUNTER — Other Ambulatory Visit (HOSPITAL_COMMUNITY)
Admission: RE | Admit: 2023-11-03 | Discharge: 2023-11-03 | Disposition: A | Discharge status: Home / Self Care | Payer: BC Managed Care – PPO | Source: Ambulatory Visit | Attending: Obstetrics and Gynecology | Admitting: Obstetrics and Gynecology

## 2023-11-03 VITALS — BP 96/68 | HR 80 | Ht 67.0 in

## 2023-11-03 DIAGNOSIS — R3121 Asymptomatic microscopic hematuria: Secondary | ICD-10-CM | POA: Diagnosis not present

## 2023-11-03 DIAGNOSIS — Z01419 Encounter for gynecological examination (general) (routine) without abnormal findings: Secondary | ICD-10-CM | POA: Diagnosis not present

## 2023-11-03 DIAGNOSIS — N898 Other specified noninflammatory disorders of vagina: Secondary | ICD-10-CM

## 2023-11-03 DIAGNOSIS — Z124 Encounter for screening for malignant neoplasm of cervix: Secondary | ICD-10-CM

## 2023-11-03 DIAGNOSIS — Z9884 Bariatric surgery status: Secondary | ICD-10-CM

## 2023-11-03 DIAGNOSIS — Z1231 Encounter for screening mammogram for malignant neoplasm of breast: Secondary | ICD-10-CM

## 2023-11-03 DIAGNOSIS — N952 Postmenopausal atrophic vaginitis: Secondary | ICD-10-CM

## 2023-11-03 LAB — POCT URINALYSIS DIPSTICK
Bilirubin, UA: NEGATIVE
Glucose, UA: NEGATIVE
Ketones, UA: NEGATIVE
Leukocytes, UA: NEGATIVE
Nitrite, UA: NEGATIVE
Protein, UA: NEGATIVE
Spec Grav, UA: 1.015 (ref 1.010–1.025)
pH, UA: 6 (ref 5.0–8.0)

## 2023-11-03 LAB — URINE CULTURE

## 2023-11-03 MED ORDER — CLOBETASOL PROPIONATE 0.05 % EX OINT
TOPICAL_OINTMENT | CUTANEOUS | 0 refills | Status: DC
Start: 2023-11-03 — End: 2023-11-27

## 2023-11-03 NOTE — Patient Instructions (Signed)
 I value your feedback and you entrusting [place] with your care. If you get a [place] patient survey, I would appreciate you taking the time to let us know about your experience today. Thank you! ? ? ?

## 2023-11-04 LAB — URINALYSIS, ROUTINE W REFLEX MICROSCOPIC
Bilirubin, UA: NEGATIVE
Glucose, UA: NEGATIVE
Ketones, UA: NEGATIVE
Leukocytes,UA: NEGATIVE
Nitrite, UA: NEGATIVE
Protein,UA: NEGATIVE
RBC, UA: NEGATIVE
Specific Gravity, UA: 1.024 (ref 1.005–1.030)
Urobilinogen, Ur: 0.2 mg/dL (ref 0.2–1.0)
pH, UA: 6 (ref 5.0–7.5)

## 2023-11-04 NOTE — Progress Notes (Signed)
Try the oint another few nights to see if burning stops. Long term excessive use of the high potency steroid is problematic but we're using it sparingly and for a short time. Not treating it has slight chance of cancer too.

## 2023-11-04 NOTE — Progress Notes (Signed)
Pls let pt know the follow up urinalysis was negative for blood so nothing else needed to be done. Question medications/supplements/foods causing odor in urine. Make sure to drink 64 oz water. Thx.

## 2023-11-05 LAB — CYTOLOGY - PAP: Diagnosis: NEGATIVE

## 2023-11-17 DIAGNOSIS — M9902 Segmental and somatic dysfunction of thoracic region: Secondary | ICD-10-CM | POA: Diagnosis not present

## 2023-11-17 DIAGNOSIS — R519 Headache, unspecified: Secondary | ICD-10-CM | POA: Diagnosis not present

## 2023-11-17 DIAGNOSIS — M9901 Segmental and somatic dysfunction of cervical region: Secondary | ICD-10-CM | POA: Diagnosis not present

## 2023-11-17 DIAGNOSIS — M62830 Muscle spasm of back: Secondary | ICD-10-CM | POA: Diagnosis not present

## 2023-11-24 NOTE — Progress Notes (Unsigned)
 Shady Padron, Michelle Sorrel, PA-C   No chief complaint on file.   HPI:      Michelle Armstrong is a 63 y.o. G2P0011 whose LMP was No LMP recorded. Patient has had a hysterectomy., presents today for *** Started clobetasol tx 11/03/23 NOTE: Ext exam looked suspicious for LS in past but pt not doing tx. Does have some vulvar itching occas.   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 07/14/2021   Closed compression fracture of body of L1 vertebra (HCC) 06/11/2021   Cervical radiculopathy 06/11/2021   Fibula fracture 06/11/2021   Acute cough 09/14/2020   Hepatic hemangioma 06/02/2020   Constipation 06/02/2020   Left ankle sprain 03/25/2018   Foot injury, left, initial encounter 03/25/2018   Osteoporosis of lumbar spine 02/19/2018   Subcutaneous nodule 09/16/2017   Pain and swelling of lower leg 09/04/2017   Nevus 09/04/2017   Pain of both hip joints 06/03/2017   Need for hepatitis C screening test 05/22/2017   SVT s/p ablation 01/09/2010    Past Surgical History:  Procedure Laterality Date   ABDOMINAL HYSTERECTOMY  1999   Total lap hyst BSO due to endometriosis   CARDIAC ELECTROPHYSIOLOGY MAPPING AND ABLATION  2011   COLONOSCOPY WITH PROPOFOL N/A 07/10/2015   Procedure: COLONOSCOPY WITH PROPOFOL;  Surgeon: Christena Deem, MD;  Location: [facility] ENDOSCOPY;  Service: Endoscopy;  Laterality: N/A;   GALLBLADDER SURGERY     HERNIA REPAIR  12/2010   LEFT HEART CATH AND CORONARY ANGIOGRAPHY N/A 02/24/2017   Procedure: Left Heart Cath and Coronary Angiography;  Surgeon: Iran Ouch, MD;  Location: [facility] INVASIVE CV LAB;   Service: Cardiovascular;  Laterality: N/A;   OTHER SURGICAL HISTORY  2023   Shoulder Biopsy   TONSILLECTOMY      Family History  Problem Relation Age of Onset   Heart disease Father        s/p CABG   Obesity Father    Hyperlipidemia Father    Bladder Cancer Father    Drug abuse Father    Thyroid disease Sister    Clotting disorder Sister        gene mutation   Breast cancer Neg Hx     Social History   Socioeconomic History   Marital status: Married    Spouse name: Not on file   Number of children: 1   Years of education: Not on file   Highest education level: Not on file  Occupational History   Occupation: customer service rep    Employer: [employer]  Tobacco Use   Smoking status: Never   Smokeless tobacco: Never   Tobacco comments:    tobacco use- no   Vaping Use   Vaping status: Never Used  Substance and Sexual Activity   Alcohol use: Yes    Alcohol/week: 0.0 standard drinks of alcohol    Comment: rare glass of wine.   Drug use: No   Sexual activity: Yes    Birth control/protection: Post-menopausal  Other Topics Concern   Not on file  Social History Narrative   Lives locally with husband.  Currently out of work (was  working @ [employer]).   Social Drivers of Corporate investment banker Strain: Not on file  Food Insecurity: Not on file  Transportation Needs: Not on file  Physical Activity: Not on file  Stress: Not on file  Social Connections: Not on file  Intimate Partner Violence: Not on file    Outpatient Medications Prior to Visit  Medication Sig Dispense Refill   aspirin EC 81 MG tablet Take 81 mg by mouth daily.     Calcium-Vitamin D-Vitamin K (VIACTIV CALCIUM PLUS D PO)      cholecalciferol (VITAMIN D) 1000 units tablet Take 1,000 Units by mouth daily.     clobetasol ointment (TEMOVATE) 0.05 % Apply to affected area every night for 4 weeks, then every other day for 4 weeks and then 1-2 times weekly as maintenance 30 g 0   Collagen Hydrolysate POWD  2 Scoops as directed.     Multiple Vitamins-Minerals (MULTIVITAMIN PO) Take 1 tablet by mouth daily.     Probiotic Product (PROBIOTIC PO) Take by mouth.     Thiamine HCl (B-1 PO) Take 12.5 mg by mouth.     Wheat Dextrin (BENEFIBER PO) Take 2 Scoops by mouth.     No facility-administered medications prior to visit.      ROS:  Review of Systems BREAST: No symptoms   OBJECTIVE:   Vitals:  There were no vitals taken for this visit.  Physical Exam  Results: No results found for this or any previous visit (from the past 24 hours).   Assessment/Plan: No diagnosis found.    No orders of the defined types were placed in this encounter.     No follow-ups on file.  Damond Borchers B. Elmire Amrein, PA-C 11/24/2023 12:54 PM

## 2023-11-25 ENCOUNTER — Encounter: Payer: Self-pay | Admitting: Obstetrics and Gynecology

## 2023-11-25 ENCOUNTER — Ambulatory Visit: Payer: BC Managed Care – PPO | Admitting: Obstetrics and Gynecology

## 2023-11-25 VITALS — BP 100/64 | HR 84 | Ht 67.0 in

## 2023-11-25 DIAGNOSIS — N898 Other specified noninflammatory disorders of vagina: Secondary | ICD-10-CM | POA: Diagnosis not present

## 2023-11-25 NOTE — Patient Instructions (Signed)
 I value your feedback and you entrusting [place] with your care. If you get a [place] patient survey, I would appreciate you taking the time to let us know about your experience today. Thank you! ? ? ?

## 2023-11-27 ENCOUNTER — Other Ambulatory Visit: Payer: Self-pay | Admitting: Obstetrics and Gynecology

## 2023-11-27 DIAGNOSIS — N898 Other specified noninflammatory disorders of vagina: Secondary | ICD-10-CM

## 2023-11-28 ENCOUNTER — Telehealth: Payer: Self-pay

## 2023-11-28 NOTE — Telephone Encounter (Signed)
 Marland Kitchen

## 2023-11-30 ENCOUNTER — Other Ambulatory Visit: Payer: Self-pay | Admitting: Obstetrics and Gynecology

## 2023-11-30 DIAGNOSIS — N898 Other specified noninflammatory disorders of vagina: Secondary | ICD-10-CM

## 2023-11-30 MED ORDER — CLOBETASOL PROPIONATE 0.05 % EX OINT: TOPICAL_OINTMENT | CUTANEOUS | 0 refills | Status: AC

## 2023-11-30 NOTE — Telephone Encounter (Signed)
 Rx changed to say 0.25 g amount.

## 2023-11-30 NOTE — Progress Notes (Signed)
 Rx sig changed for exact gram amt per ins.

## 2023-12-08 ENCOUNTER — Telehealth: Payer: Self-pay

## 2023-12-08 NOTE — Telephone Encounter (Signed)
 TRIAGE VOICEMAIL: Patient states she saw Helmut Muster last week. She is requesting a return call from [place]. No details given.

## 2023-12-09 NOTE — Telephone Encounter (Signed)
 Spoke with pt. She got [employer] EOB from 11/25/23 appt with me for over $300 negotiated rate, charge to [facility] was like $475 (pt can't find her EOB right now so will f/u with me again once she has it). Spoke with Renita who stated the [phone] charge (which I billed at appt) is $105. Pt to f/u and we'll go from there. Hasn't gotten bill from [facility] yet.

## 2024-05-25 ENCOUNTER — Encounter: Payer: BC Managed Care – PPO | Admitting: Dermatology

## 2024-05-31 ENCOUNTER — Telehealth: Payer: Self-pay

## 2024-05-31 NOTE — Telephone Encounter (Signed)
 Called and read pts allergy  list as she stated she has moved to [place]  and her provider wanted to know what she was allergic to and she wanted to add methylprednisolone  as well to her list, it has now been added

## 2024-05-31 NOTE — Telephone Encounter (Signed)
 Copied from CRM [id number]. Topic: Clinical - Medical Advice >> May 31, 2024  3:04 PM Viola F wrote: Reason for CRM: Patient last seen Leron Glance 01/2023 - she forgot what she's allergic too and would like a call back with information with all of her allergies. Please call her at [phone] (M)

## 2024-08-05 NOTE — Telephone Encounter (Signed)
 open in error
